# Patient Record
Sex: Female | Born: 1945
Health system: Southern US, Community
[De-identification: ages and names within clinical notes are randomized; demographics above are authoritative.]

## PROBLEM LIST (undated history)

## (undated) DIAGNOSIS — E7800 Pure hypercholesterolemia, unspecified: Secondary | ICD-10-CM

## (undated) DIAGNOSIS — E782 Mixed hyperlipidemia: Secondary | ICD-10-CM

## (undated) DIAGNOSIS — G4730 Sleep apnea, unspecified: Secondary | ICD-10-CM

## (undated) DIAGNOSIS — I10 Essential (primary) hypertension: Secondary | ICD-10-CM

## (undated) DIAGNOSIS — E119 Type 2 diabetes mellitus without complications: Secondary | ICD-10-CM

## (undated) DIAGNOSIS — I739 Peripheral vascular disease, unspecified: Secondary | ICD-10-CM

## (undated) HISTORY — DX: Mixed hyperlipidemia: E78.2

## (undated) HISTORY — PX: CYST REMOVAL TRUNK: SHX6283

## (undated) HISTORY — DX: Essential (primary) hypertension: I10

## (undated) HISTORY — DX: Type 2 diabetes mellitus without complications: E11.9

## (undated) HISTORY — PX: OTHER SURGICAL HISTORY: SHX169

## (undated) HISTORY — PX: CERVICAL SPINE SURGERY: SHX589

## (undated) HISTORY — PX: BACK SURGERY: SHX140

## (undated) HISTORY — PX: ABDOMINAL HYSTERECTOMY: SHX81

---
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
## 2002-08-23 DIAGNOSIS — I219 Acute myocardial infarction, unspecified: Secondary | ICD-10-CM

## 2002-08-23 HISTORY — DX: Acute myocardial infarction, unspecified: I21.9

## 2003-04-10 ENCOUNTER — Emergency Department (HOSPITAL_COMMUNITY): Admission: EM | Admit: 2003-04-10 | Discharge: 2003-04-10 | Payer: Self-pay | Admitting: Emergency Medicine

## 2009-08-19 ENCOUNTER — Emergency Department (HOSPITAL_COMMUNITY): Admission: EM | Admit: 2009-08-19 | Discharge: 2009-08-19 | Payer: Self-pay | Admitting: Emergency Medicine

## 2009-08-21 ENCOUNTER — Emergency Department (HOSPITAL_COMMUNITY): Admission: EM | Admit: 2009-08-21 | Discharge: 2009-08-21 | Payer: Self-pay | Admitting: Emergency Medicine

## 2009-09-26 ENCOUNTER — Ambulatory Visit (HOSPITAL_COMMUNITY): Admission: RE | Admit: 2009-09-26 | Discharge: 2009-09-26 | Payer: Self-pay | Admitting: Internal Medicine

## 2009-10-08 ENCOUNTER — Ambulatory Visit (HOSPITAL_COMMUNITY): Admission: RE | Admit: 2009-10-08 | Discharge: 2009-10-08 | Payer: Self-pay | Admitting: Internal Medicine

## 2009-10-14 ENCOUNTER — Encounter: Payer: Self-pay | Admitting: Urgent Care

## 2010-09-22 NOTE — Letter (Signed)
Summary: Referral from [facility]  Referral from Dr T. Fanta's office for screening colonoscopy.  Upon further questioning, pt notes colonoscopy in [place] 2007 that was normal.  No polyps, no FH, no GI problems per pt.  Called Dr Letitia Neri office who verified referral was to arrange screening colonoscopy.  Pt asked to retrieve copy of colonoscopy results from [place] and give to Dr Felecia Shelling. Explained we would be glad to see pt if, in fact, she was due for screening colonoscopy at this time.  Explained current guidelines are q 10 yrs if no FH or polyps.  She verbalized understanding & will FU w/ Dr Felecia Shelling.  Given our office manager, [facility],  card to contact us if she has further questions or to call & reschedule if she has further problems & when she is due for exam.

## 2010-11-12 LAB — BLOOD GAS, ARTERIAL
Bicarbonate: 27.3 mEq/L — ABNORMAL HIGH (ref 20.0–24.0)
FIO2: 21 %
O2 Saturation: 94.2 %
Patient temperature: 37
TCO2: 24.5 mmol/L (ref 0–100)
pO2, Arterial: 69.7 mmHg — ABNORMAL LOW (ref 80.0–100.0)

## 2010-11-23 LAB — CBC
HCT: 43.2 % (ref 36.0–46.0)
Hemoglobin: 14.3 g/dL (ref 12.0–15.0)
MCV: 82.6 fL (ref 78.0–100.0)
RBC: 5.24 MIL/uL — ABNORMAL HIGH (ref 3.87–5.11)
WBC: 16.4 10*3/uL — ABNORMAL HIGH (ref 4.0–10.5)

## 2010-11-23 LAB — URINE MICROSCOPIC-ADD ON

## 2010-11-23 LAB — DIFFERENTIAL
Basophils Relative: 0 % (ref 0–1)
Eosinophils Absolute: 0 10*3/uL (ref 0.0–0.7)
Eosinophils Relative: 0 % (ref 0–5)
Monocytes Absolute: 1.4 10*3/uL — ABNORMAL HIGH (ref 0.1–1.0)
Neutrophils Relative %: 83 % — ABNORMAL HIGH (ref 43–77)

## 2010-11-23 LAB — URINE CULTURE: Colony Count: NO GROWTH

## 2010-11-23 LAB — URINALYSIS, ROUTINE W REFLEX MICROSCOPIC
Glucose, UA: NEGATIVE mg/dL
Leukocytes, UA: NEGATIVE
Protein, ur: >300 mg/dL — AB
pH: 5.5 (ref 5.0–8.0)

## 2010-11-23 LAB — RAPID STREP SCREEN (MED CTR MEBANE ONLY): Streptococcus, Group A Screen (Direct): POSITIVE — AB

## 2010-11-23 LAB — BASIC METABOLIC PANEL
BUN: 22 mg/dL (ref 6–23)
Calcium: 8.3 mg/dL — ABNORMAL LOW (ref 8.4–10.5)
GFR calc Af Amer: 37 mL/min — ABNORMAL LOW (ref >60)
GFR calc non Af Amer: 30 mL/min — ABNORMAL LOW (ref >60)
Sodium: 135 mEq/L (ref 135–145)

## 2012-03-06 DIAGNOSIS — N1831 Chronic kidney disease, stage 3a: Secondary | ICD-10-CM | POA: Insufficient documentation

## 2012-03-06 DIAGNOSIS — N182 Chronic kidney disease, stage 2 (mild): Secondary | ICD-10-CM | POA: Insufficient documentation

## 2013-07-14 ENCOUNTER — Encounter (HOSPITAL_COMMUNITY): Payer: Self-pay | Admitting: Emergency Medicine

## 2013-07-14 ENCOUNTER — Emergency Department (HOSPITAL_COMMUNITY)
Admission: EM | Admit: 2013-07-14 | Discharge: 2013-07-14 | Disposition: A | Discharge status: Home / Self Care | Payer: PRIVATE HEALTH INSURANCE | Attending: Emergency Medicine | Admitting: Emergency Medicine

## 2013-07-14 DIAGNOSIS — L723 Sebaceous cyst: Secondary | ICD-10-CM | POA: Insufficient documentation

## 2013-07-14 DIAGNOSIS — M62830 Muscle spasm of back: Secondary | ICD-10-CM

## 2013-07-14 DIAGNOSIS — Z7982 Long term (current) use of aspirin: Secondary | ICD-10-CM | POA: Insufficient documentation

## 2013-07-14 DIAGNOSIS — L089 Local infection of the skin and subcutaneous tissue, unspecified: Secondary | ICD-10-CM

## 2013-07-14 DIAGNOSIS — E7800 Pure hypercholesterolemia, unspecified: Secondary | ICD-10-CM | POA: Insufficient documentation

## 2013-07-14 DIAGNOSIS — L02219 Cutaneous abscess of trunk, unspecified: Secondary | ICD-10-CM | POA: Insufficient documentation

## 2013-07-14 DIAGNOSIS — M5380 Other specified dorsopathies, site unspecified: Secondary | ICD-10-CM | POA: Insufficient documentation

## 2013-07-14 DIAGNOSIS — Z9889 Other specified postprocedural states: Secondary | ICD-10-CM | POA: Insufficient documentation

## 2013-07-14 DIAGNOSIS — Z79899 Other long term (current) drug therapy: Secondary | ICD-10-CM | POA: Insufficient documentation

## 2013-07-14 DIAGNOSIS — I10 Essential (primary) hypertension: Secondary | ICD-10-CM | POA: Insufficient documentation

## 2013-07-14 DIAGNOSIS — E119 Type 2 diabetes mellitus without complications: Secondary | ICD-10-CM | POA: Insufficient documentation

## 2013-07-14 DIAGNOSIS — Z87891 Personal history of nicotine dependence: Secondary | ICD-10-CM | POA: Insufficient documentation

## 2013-07-14 HISTORY — DX: Essential (primary) hypertension: I10

## 2013-07-14 HISTORY — DX: Type 2 diabetes mellitus without complications: E11.9

## 2013-07-14 HISTORY — DX: Pure hypercholesterolemia, unspecified: E78.00

## 2013-07-14 LAB — GLUCOSE, CAPILLARY: Glucose-Capillary: 94 mg/dL (ref 70–99)

## 2013-07-14 MED ORDER — CYCLOBENZAPRINE HCL 10 MG PO TABS
10.0000 mg | ORAL_TABLET | Freq: Three times a day (TID) | ORAL | Status: DC | PRN
  Administered 2013-07-14: 10 mg via ORAL
  Filled 2013-07-14: qty 1

## 2013-07-14 MED ORDER — KETOROLAC TROMETHAMINE 60 MG/2ML IM SOLN
60.0000 mg | Freq: Once | INTRAMUSCULAR | Status: AC
  Administered 2013-07-14: 60 mg via INTRAMUSCULAR
  Filled 2013-07-14: qty 2

## 2013-07-14 MED ORDER — LIDOCAINE-EPINEPHRINE (PF) 1 %-1:200000 IJ SOLN
INTRAMUSCULAR | Status: AC
  Filled 2013-07-14: qty 10

## 2013-07-14 MED ORDER — LIDOCAINE-EPINEPHRINE (PF) 1 %-1:200000 IJ SOLN
10.0000 mL | Freq: Once | INTRAMUSCULAR | Status: AC
  Administered 2013-07-14: 18:00:00 via INTRADERMAL

## 2013-07-14 MED ORDER — CYCLOBENZAPRINE HCL 5 MG PO TABS: 5.0000 mg | ORAL_TABLET | Freq: Three times a day (TID) | ORAL | Status: DC | PRN

## 2013-07-14 MED ORDER — SULFAMETHOXAZOLE-TRIMETHOPRIM 800-160 MG PO TABS: 1.0000 | ORAL_TABLET | Freq: Two times a day (BID) | ORAL | Status: DC

## 2013-07-14 MED ORDER — LIDOCAINE-EPINEPHRINE 1 %-1:100000 IJ SOLN: 10.0000 mL | Freq: Once | INTRAMUSCULAR | Status: DC

## 2013-07-14 MED ORDER — NAPROXEN 375 MG PO TABS: 375.0000 mg | ORAL_TABLET | Freq: Two times a day (BID) | ORAL | Status: DC

## 2013-07-14 NOTE — ED Notes (Signed)
Pt states cyst to right upper back has gotten bigger, also starting to have pain to left lower back pain

## 2013-07-14 NOTE — ED Provider Notes (Signed)
CSN: [id number]     Arrival date & time 07/14/13  1619 History  This chart was scribed for Nicole Givens, MD by Quintella Reichert, ED scribe.  This patient was seen in room APA17/APA17 and the patient's care was started at 5:03 PM.   Chief Complaint  Patient presents with  . Back Pain  . Abscess    The history is provided by the patient. No language interpreter was used.    HPI Comments: Nicole Horn is a 67 y.o. female with h/o DM, HTN and high cholesterol who presents to the Emergency Department complaining of moderate left-sided back pain that began early this morning.  Back pain is worsened by leaning to the side and bending forwards.  She notes that earlier this week she was blowing leaves in her yard for less than half an hour.  She denies any other recent injuries or unusual activities that may have brought on pain.  She is left-handed.  She denies weakness or numbness in legs.  Pt denies prior h/o back issues.    Pt also complains of an cyst to the right upper back that has been present "for a while" but has been growing larger over the past several days.  She denies fever, nausea or vomiting.  She does admit to prior h/o abscesses/cysts in various locations and has had all of her surgeries to remove them in [place].  Her last surgery was 3-4 years ago.  PCP in [place] is Dr. Felecia Shelling.    Past Medical History  Diagnosis Date  . Diabetes mellitus without complication   . Hypertension   . High cholesterol     Past Surgical History  Procedure Laterality Date  . Cyst removal trunk    . Multiple cyst removal surgeries    . Abdominal hysterectomy      History reviewed. No pertinent family history.   History  Substance Use Topics  . Smoking status: Former Games developer  . Smokeless tobacco: Not on file  . Alcohol Use: Yes     Comment: very rare    Pt does not smoke.  She is a retired former Mining engineer.  She lived in [place] for 50 years and now lives here part-time  to be with her mother.  OB History   Grav Para Term Preterm Abortions TAB SAB Ect Mult Living                  Review of Systems  Constitutional: Negative for fever.  Gastrointestinal: Negative for nausea and vomiting.  Musculoskeletal: Positive for back pain.  Skin:       Abscess  Neurological: Negative for weakness and numbness.  All other systems reviewed and are negative.     Allergies  Review of patient's allergies indicates no known allergies.  Home Medications   Current Outpatient Rx  Name  Route  Sig  Dispense  Refill  . aspirin EC 81 MG tablet   Oral   Take 81 mg by mouth daily.         . hydrALAZINE (APRESOLINE) 25 MG tablet   Oral   Take 25 mg by mouth every 8 (eight) hours.         . lovastatin (MEVACOR) 40 MG tablet   Oral   Take 40 mg by mouth at bedtime.         . quinapril (ACCUPRIL) 10 MG tablet   Oral   Take 10 mg by mouth daily.  BP 144/80  Pulse 80  Temp(Src) 98.7 F (37.1 C) (Oral)  Resp 20  Ht 5' (1.524 m)  Wt 163 lb (73.936 kg)  BMI 31.83 kg/m2  SpO2 100%  Vital signs normal    Physical Exam  Nursing note and vitals reviewed. Constitutional: She is oriented to person, place, and time. She appears well-developed and well-nourished.  Non-toxic appearance. She does not appear ill. No distress.  HENT:  Head: Normocephalic and atraumatic.  Right Ear: External ear normal.  Left Ear: External ear normal.  Nose: Nose normal. No mucosal edema or rhinorrhea.  Mouth/Throat: Oropharynx is clear and moist and mucous membranes are normal. No dental abscesses or uvula swelling.  Eyes: Conjunctivae and EOM are normal. Pupils are equal, round, and reactive to light.  Neck: Normal range of motion and full passive range of motion without pain. Neck supple.  Cardiovascular: Normal rate, regular rhythm and normal heart sounds.  Exam reveals no gallop and no friction rub.   No murmur heard. Pulmonary/Chest: Effort normal and  breath sounds normal. No respiratory distress. She has no wheezes. She has no rhonchi. She has no rales. She exhibits no tenderness and no crepitus.  Abdominal: Soft. Normal appearance and bowel sounds are normal. She exhibits no distension. There is no tenderness. There is no rebound and no guarding.  Musculoskeletal: Normal range of motion. She exhibits tenderness. She exhibits no edema.       Back:  Tender in mid-lower thoracic spine into the upper lumbar spine and paraspinous muscles on the left.  Muscles feel firm. Moves all extremities well.   Neurological: She is alert and oriented to person, place, and time. She has normal strength. No cranial nerve deficit.  Skin: Skin is warm, dry and intact. No pallor.  1-cm cyst on right mid upper back, just medial to scapula.  Slightly firm.  Nontender. 2.5-cm area of redness near the lateral right back just superior to the axilla, swollen and very indurated.    Psychiatric: She has a normal mood and affect. Her speech is normal and behavior is normal. Her mood appears not anxious.    ED Course  Procedures (including critical care time)  Medications  cyclobenzaprine (FLEXERIL) tablet 10 mg (10 mg Oral Given 07/14/13 1728)  ketorolac (TORADOL) injection 60 mg (60 mg Intramuscular Given 07/14/13 1729)  lidocaine-EPINEPHrine (XYLOCAINE-EPINEPHrine) 1 %-1:200000 (with pres) injection 10 mL ( Intradermal Given by Other 07/14/13 1752)     DIAGNOSTIC STUDIES: Oxygen Saturation is 100% on room air, normal by my interpretation.    COORDINATION OF CARE: 5:13 PM-Discussed treatment plan which includes I&D with pt at bedside and pt agreed to plan.   Pt states her back pain is better after medications.     INCISION AND DRAINAGE Performed by: Nicole Givens, MD Consent: Verbal consent obtained. Risks and benefits: risks, benefits and alternatives were discussed Type: abscess Body area: lateral right back just superior to the axilla Anesthesia:  local infiltration Incision was made with an 11 scalpel. Local anesthetic: lidocaine 1% with 1% epinephrine Anesthetic total: 1 ml Complexity: complex Blunt dissection to break up loculations Drainage: purulent, pieces of the sebaceous sac also came out Drainage amount: small Packing material: 1/4 in iodoform gauze Patient tolerance: Patient tolerated the procedure well with no immediate complications.   Labs Review Results for orders placed during the hospital encounter of 07/14/13  GLUCOSE, CAPILLARY      Result Value Range   Glucose-Capillary 94  70 - 99 mg/dL  Imaging Review No results found.  EKG Interpretation   None       MDM  patient has history of sebaceous cyst and some abscesses. She has a cyst on her right posterior shoulder that has gotten progressively painful and now is an infected sebaceous cyst. Her left back pain is most likely from holding her self differently because of pain on her right side from her abscess. She reports she has been able to sleep on her side and has been sleeping on her abdomen.    1. Muscle spasm of back   2. Infected sebaceous cyst    New Prescriptions   CYCLOBENZAPRINE (FLEXERIL) 5 MG TABLET    Take 1 tablet (5 mg total) by mouth 3 (three) times daily as needed for muscle spasms.   NAPROXEN (NAPROSYN) 375 MG TABLET    Take 1 tablet (375 mg total) by mouth 2 (two) times daily.   SULFAMETHOXAZOLE-TRIMETHOPRIM (SEPTRA DS) 800-160 MG PER TABLET    Take 1 tablet by mouth every 12 (twelve) hours.    Plan discharge   Devoria Albe, MD, FACEP     I personally performed the services described in this documentation, which was scribed in my presence. The recorded information has been reviewed and considered.  Devoria Albe, MD, Armando Gang    Nicole Givens, MD 07/14/13 [phone]

## 2014-07-30 ENCOUNTER — Emergency Department (HOSPITAL_COMMUNITY): Payer: Medicare (Managed Care)

## 2014-07-30 ENCOUNTER — Encounter (HOSPITAL_COMMUNITY): Payer: Self-pay | Admitting: *Deleted

## 2014-07-30 ENCOUNTER — Emergency Department (HOSPITAL_COMMUNITY)
Admission: EM | Admit: 2014-07-30 | Discharge: 2014-07-30 | Disposition: A | Discharge status: Home / Self Care | Payer: Medicare (Managed Care) | Attending: Emergency Medicine | Admitting: Emergency Medicine

## 2014-07-30 DIAGNOSIS — E119 Type 2 diabetes mellitus without complications: Secondary | ICD-10-CM | POA: Insufficient documentation

## 2014-07-30 DIAGNOSIS — E780 Pure hypercholesterolemia: Secondary | ICD-10-CM | POA: Diagnosis not present

## 2014-07-30 DIAGNOSIS — R1084 Generalized abdominal pain: Secondary | ICD-10-CM | POA: Insufficient documentation

## 2014-07-30 DIAGNOSIS — I10 Essential (primary) hypertension: Secondary | ICD-10-CM | POA: Insufficient documentation

## 2014-07-30 DIAGNOSIS — Z79899 Other long term (current) drug therapy: Secondary | ICD-10-CM | POA: Diagnosis not present

## 2014-07-30 DIAGNOSIS — E860 Dehydration: Secondary | ICD-10-CM

## 2014-07-30 DIAGNOSIS — Z87891 Personal history of nicotine dependence: Secondary | ICD-10-CM | POA: Insufficient documentation

## 2014-07-30 DIAGNOSIS — Z7982 Long term (current) use of aspirin: Secondary | ICD-10-CM | POA: Insufficient documentation

## 2014-07-30 DIAGNOSIS — Z792 Long term (current) use of antibiotics: Secondary | ICD-10-CM | POA: Insufficient documentation

## 2014-07-30 DIAGNOSIS — Z791 Long term (current) use of non-steroidal anti-inflammatories (NSAID): Secondary | ICD-10-CM | POA: Insufficient documentation

## 2014-07-30 DIAGNOSIS — Z90710 Acquired absence of both cervix and uterus: Secondary | ICD-10-CM | POA: Insufficient documentation

## 2014-07-30 DIAGNOSIS — R112 Nausea with vomiting, unspecified: Secondary | ICD-10-CM | POA: Insufficient documentation

## 2014-07-30 LAB — CBC WITH DIFFERENTIAL/PLATELET
BASOS ABS: 0 10*3/uL (ref 0.0–0.1)
Basophils Relative: 0 % (ref 0–1)
EOS PCT: 0 % (ref 0–5)
Eosinophils Absolute: 0 10*3/uL (ref 0.0–0.7)
HCT: 44.1 % (ref 36.0–46.0)
Hemoglobin: 14.3 g/dL (ref 12.0–15.0)
LYMPHS ABS: 1.4 10*3/uL (ref 0.7–4.0)
LYMPHS PCT: 18 % (ref 12–46)
MCH: 26.4 pg (ref 26.0–34.0)
MCHC: 32.4 g/dL (ref 30.0–36.0)
MCV: 81.5 fL (ref 78.0–100.0)
Monocytes Absolute: 0.3 10*3/uL (ref 0.1–1.0)
Monocytes Relative: 5 % (ref 3–12)
NEUTROS ABS: 5.9 10*3/uL (ref 1.7–7.7)
NEUTROS PCT: 77 % (ref 43–77)
Platelets: 263 10*3/uL (ref 150–400)
RBC: 5.41 MIL/uL — AB (ref 3.87–5.11)
RDW: 13.5 % (ref 11.5–15.5)
WBC: 7.6 10*3/uL (ref 4.0–10.5)

## 2014-07-30 LAB — COMPREHENSIVE METABOLIC PANEL
ALT: 26 U/L (ref 0–35)
ANION GAP: 16 — AB (ref 5–15)
AST: 23 U/L (ref 0–37)
Albumin: 4 g/dL (ref 3.5–5.2)
Alkaline Phosphatase: 114 U/L (ref 39–117)
BILIRUBIN TOTAL: 0.3 mg/dL (ref 0.3–1.2)
BUN: 13 mg/dL (ref 6–23)
CALCIUM: 9.6 mg/dL (ref 8.4–10.5)
CHLORIDE: 103 meq/L (ref 96–112)
CO2: 25 meq/L (ref 19–32)
CREATININE: 0.94 mg/dL (ref 0.50–1.10)
GFR calc non Af Amer: 61 mL/min — ABNORMAL LOW (ref >90)
GFR, EST AFRICAN AMERICAN: 71 mL/min — AB (ref >90)
GLUCOSE: 176 mg/dL — AB (ref 70–99)
Potassium: 3.8 mEq/L (ref 3.7–5.3)
Sodium: 144 mEq/L (ref 137–147)
Total Protein: 8.2 g/dL (ref 6.0–8.3)

## 2014-07-30 LAB — POC OCCULT BLOOD, ED: Fecal Occult Bld: NEGATIVE

## 2014-07-30 LAB — URINALYSIS, ROUTINE W REFLEX MICROSCOPIC
Bilirubin Urine: NEGATIVE
Glucose, UA: NEGATIVE mg/dL
Ketones, ur: NEGATIVE mg/dL
LEUKOCYTES UA: NEGATIVE
NITRITE: NEGATIVE
PH: 5.5 (ref 5.0–8.0)
Protein, ur: 100 mg/dL — AB
Urobilinogen, UA: 0.2 mg/dL (ref 0.0–1.0)

## 2014-07-30 LAB — URINE MICROSCOPIC-ADD ON

## 2014-07-30 LAB — LIPASE, BLOOD: LIPASE: 62 U/L — AB (ref 11–59)

## 2014-07-30 MED ORDER — METOCLOPRAMIDE HCL 5 MG/ML IJ SOLN
10.0000 mg | Freq: Once | INTRAMUSCULAR | Status: AC
  Administered 2014-07-30: 10 mg via INTRAVENOUS
  Filled 2014-07-30: qty 2

## 2014-07-30 MED ORDER — DIPHENHYDRAMINE HCL 50 MG/ML IJ SOLN
25.0000 mg | Freq: Once | INTRAMUSCULAR | Status: AC
  Administered 2014-07-30: 25 mg via INTRAVENOUS
  Filled 2014-07-30: qty 1

## 2014-07-30 MED ORDER — IOHEXOL 300 MG/ML  SOLN
100.0000 mL | Freq: Once | INTRAMUSCULAR | Status: AC | PRN
  Administered 2014-07-30: 100 mL via INTRAVENOUS

## 2014-07-30 MED ORDER — ONDANSETRON HCL 4 MG/2ML IJ SOLN
4.0000 mg | Freq: Once | INTRAMUSCULAR | Status: AC
  Administered 2014-07-30: 4 mg via INTRAVENOUS
  Filled 2014-07-30: qty 2

## 2014-07-30 MED ORDER — SODIUM CHLORIDE 0.9 % IV BOLUS (SEPSIS): 1000.0000 mL | Freq: Once | INTRAVENOUS | Status: DC

## 2014-07-30 MED ORDER — METOCLOPRAMIDE HCL 10 MG PO TABS: 10.0000 mg | ORAL_TABLET | Freq: Four times a day (QID) | ORAL | Status: DC | PRN

## 2014-07-30 MED ORDER — ONDANSETRON HCL 4 MG PO TABS: 4.0000 mg | ORAL_TABLET | Freq: Three times a day (TID) | ORAL | Status: DC | PRN

## 2014-07-30 MED ORDER — MORPHINE SULFATE 4 MG/ML IJ SOLN
4.0000 mg | Freq: Once | INTRAMUSCULAR | Status: AC
  Administered 2014-07-30: 4 mg via INTRAVENOUS
  Filled 2014-07-30: qty 1

## 2014-07-30 MED ORDER — SODIUM CHLORIDE 0.9 % IV BOLUS (SEPSIS)
1000.0000 mL | Freq: Once | INTRAVENOUS | Status: AC
  Administered 2014-07-30: 1000 mL via INTRAVENOUS

## 2014-07-30 MED ORDER — IOHEXOL 300 MG/ML  SOLN
25.0000 mL | Freq: Once | INTRAMUSCULAR | Status: AC | PRN
  Administered 2014-07-30: 25 mL via ORAL

## 2014-07-30 MED ORDER — MORPHINE SULFATE 4 MG/ML IJ SOLN
4.0000 mg | Freq: Once | INTRAMUSCULAR | Status: AC
Start: 2014-07-30 — End: 2014-07-30
  Administered 2014-07-30: 4 mg via INTRAVENOUS
  Filled 2014-07-30: qty 1

## 2014-07-30 NOTE — ED Notes (Signed)
Pt drowsy but says feels better.  Pt's room air 02 sat decreased to 89% while sleeping.  Placed pt on 02 at 2liters and 02 sat increased to 98%.  Pt sitting up in bed drinking water.  VSS.

## 2014-07-30 NOTE — ED Provider Notes (Signed)
CSN: [id number]     Arrival date & time 07/30/14  0152 History   First MD Initiated Contact with Patient 07/30/14 0205     Chief Complaint  Patient presents with  . Abdominal Pain     (Consider location/radiation/quality/duration/timing/severity/associated sxs/prior Treatment) HPI  Patient reports she felt fatigued all day today. About 6 PM she started having diffuse abdominal pain that comes and goes like gas pain. She states she took Pepto-Bismol twice which only helped briefly. She also drinks some warm water which did not help. She reports nausea with vomiting 3. She states the pain comes and goes and is described as cramping and sharp. She states she's had a feeling she needs to have a bowel movement and she was unable to have one although at one point she did have a small amount of stool that was both dark and light. She states when she wiped she saw blood. She also states she saw streaks of blood in her vomitus. She also states she saw some blood in her nose. She has never had this pain before. She does report prior hysterectomy about 20 years ago.  PCP Dr Legrand Rams PCP in [place]  Past Medical History  Diagnosis Date  . Diabetes mellitus without complication   . Hypertension   . High cholesterol    Past Surgical History  Procedure Laterality Date  . Cyst removal trunk    . Multiple cyst removal surgeries    . Abdominal hysterectomy     History reviewed. No pertinent family history. History  Substance Use Topics  . Smoking status: Former Research scientist (life sciences)  . Smokeless tobacco: Not on file  . Alcohol Use: Yes     Comment: very rare   Lives alone Is living between here and [place]  OB History    No data available     Review of Systems  All other systems reviewed and are negative.     Allergies  Review of patient's allergies indicates no known allergies.  Home Medications   Prior to Admission medications   Medication Sig Start Date End Date Taking? Authorizing Provider   aspirin EC 81 MG tablet Take 81 mg by mouth daily.    Historical Provider, MD  cyclobenzaprine (FLEXERIL) 5 MG tablet Take 1 tablet (5 mg total) by mouth 3 (three) times daily as needed for muscle spasms. 07/14/13   Janice Norrie, MD  hydrALAZINE (APRESOLINE) 25 MG tablet Take 25 mg by mouth every 8 (eight) hours.    Historical Provider, MD  lovastatin (MEVACOR) 40 MG tablet Take 40 mg by mouth at bedtime.    Historical Provider, MD  naproxen (NAPROSYN) 375 MG tablet Take 1 tablet (375 mg total) by mouth 2 (two) times daily. 07/14/13   Janice Norrie, MD  quinapril (ACCUPRIL) 10 MG tablet Take 10 mg by mouth daily.    Historical Provider, MD  sulfamethoxazole-trimethoprim (SEPTRA DS) 800-160 MG per tablet Take 1 tablet by mouth every 12 (twelve) hours. 07/14/13   Janice Norrie, MD   BP 200/153 mmHg  Pulse 83  Temp(Src) 97.6 F (36.4 C) (Oral)  Resp 20  Ht 5' (1.524 m)  Wt 165 lb (74.844 kg)  BMI 32.22 kg/m2  SpO2 97%  Vital signs normal except for hypertension Physical Exam  Constitutional: She is oriented to person, place, and time. She appears well-developed and well-nourished.  Non-toxic appearance. She does not appear ill. She appears distressed.  Patient periodically during her interview will grab her abdomen as  if she is having spasms of pain  HENT:  Head: Normocephalic and atraumatic.  Right Ear: External ear normal.  Left Ear: External ear normal.  Nose: Nose normal. No mucosal edema or rhinorrhea.  Mouth/Throat: Oropharynx is clear and moist and mucous membranes are normal. No dental abscesses or uvula swelling.  Eyes: Conjunctivae and EOM are normal. Pupils are equal, round, and reactive to light.  Neck: Normal range of motion and full passive range of motion without pain. Neck supple.  Cardiovascular: Normal rate, regular rhythm and normal heart sounds.  Exam reveals no gallop and no friction rub.   No murmur heard. Pulmonary/Chest: Effort normal and breath sounds normal. No  respiratory distress. She has no wheezes. She has no rhonchi. She has no rales. She exhibits no tenderness and no crepitus.  Abdominal: Soft. Normal appearance and bowel sounds are normal. She exhibits distension. There is generalized tenderness. There is no rebound and no guarding.  Musculoskeletal: Normal range of motion. She exhibits no edema or tenderness.  Moves all extremities well.   Neurological: She is alert and oriented to person, place, and time. She has normal strength. No cranial nerve deficit.  Skin: Skin is warm, dry and intact. No rash noted. No erythema. No pallor.  Psychiatric: She has a normal mood and affect. Her speech is normal and behavior is normal. Her mood appears not anxious.  Nursing note and vitals reviewed.   ED Course  Procedures (including critical care time)  Medications  sodium chloride 0.9 % bolus 1,000 mL (not administered)  sodium chloride 0.9 % bolus 1,000 mL (not administered)  sodium chloride 0.9 % bolus 1,000 mL (1,000 mLs Intravenous New Bag/Given 07/30/14 0248)  ondansetron (ZOFRAN) injection 4 mg (4 mg Intravenous Given 07/30/14 0251)  morphine 4 MG/ML injection 4 mg (4 mg Intravenous Given 07/30/14 0248)  iohexol (OMNIPAQUE) 300 MG/ML solution 25 mL (25 mLs Oral Contrast Given 07/30/14 0514)  iohexol (OMNIPAQUE) 300 MG/ML solution 100 mL (100 mLs Intravenous Contrast Given 07/30/14 0515)  morphine 4 MG/ML injection 4 mg (4 mg Intravenous Given 07/30/14 0548)  metoCLOPramide (REGLAN) injection 10 mg (10 mg Intravenous Given 07/30/14 0643)  diphenhydrAMINE (BENADRYL) injection 25 mg (25 mg Intravenous Given 07/30/14 0645)   04 10 patient given results of her tests and x-rays. She states her pain is much improved. Patient's lying quietly on a stretcher now. We discussed need for CT scan and she is agreeable.  06:00 patient was given the results of her CT scan. She was given more pain and nausea medicine. Hopefully she will be able to be discharged  home.  Recheck at 06 50 patient is sleeping and when awakened states she's feeling improved.  07:15 pt left at change of shift to finish IV fluids and to make sure she can tolerate oral fluids.  Dr Vanita Panda will discharge or admit if she gets worse.   Labs Review Results for orders placed or performed during the hospital encounter of 07/30/14  Comprehensive metabolic panel  Result Value Ref Range   Sodium 144 137 - 147 mEq/L   Potassium 3.8 3.7 - 5.3 mEq/L   Chloride 103 96 - 112 mEq/L   CO2 25 19 - 32 mEq/L   Glucose, Bld 176 (H) 70 - 99 mg/dL   BUN 13 6 - 23 mg/dL   Creatinine, Ser 0.94 0.50 - 1.10 mg/dL   Calcium 9.6 8.4 - 10.5 mg/dL   Total Protein 8.2 6.0 - 8.3 g/dL   Albumin 4.0 3.5 -  5.2 g/dL   AST 23 0 - 37 U/L   ALT 26 0 - 35 U/L   Alkaline Phosphatase 114 39 - 117 U/L   Total Bilirubin 0.3 0.3 - 1.2 mg/dL   GFR calc non Af Amer 61 (L) >90 mL/min   GFR calc Af Amer 71 (L) >90 mL/min   Anion gap 16 (H) 5 - 15  CBC with Differential  Result Value Ref Range   WBC 7.6 4.0 - 10.5 K/uL   RBC 5.41 (H) 3.87 - 5.11 MIL/uL   Hemoglobin 14.3 12.0 - 15.0 g/dL   HCT 44.1 36.0 - 46.0 %   MCV 81.5 78.0 - 100.0 fL   MCH 26.4 26.0 - 34.0 pg   MCHC 32.4 30.0 - 36.0 g/dL   RDW 13.5 11.5 - 15.5 %   Platelets 263 150 - 400 K/uL   Neutrophils Relative % 77 43 - 77 %   Neutro Abs 5.9 1.7 - 7.7 K/uL   Lymphocytes Relative 18 12 - 46 %   Lymphs Abs 1.4 0.7 - 4.0 K/uL   Monocytes Relative 5 3 - 12 %   Monocytes Absolute 0.3 0.1 - 1.0 K/uL   Eosinophils Relative 0 0 - 5 %   Eosinophils Absolute 0.0 0.0 - 0.7 K/uL   Basophils Relative 0 0 - 1 %   Basophils Absolute 0.0 0.0 - 0.1 K/uL  Lipase, blood  Result Value Ref Range   Lipase 62 (H) 11 - 59 U/L  Urinalysis, Routine w reflex microscopic  Result Value Ref Range   Color, Urine YELLOW YELLOW   APPearance CLEAR CLEAR   Specific Gravity, Urine >1.030 (H) 1.005 - 1.030   pH 5.5 5.0 - 8.0   Glucose, UA NEGATIVE NEGATIVE mg/dL    Hgb urine dipstick SMALL (A) NEGATIVE   Bilirubin Urine NEGATIVE NEGATIVE   Ketones, ur NEGATIVE NEGATIVE mg/dL   Protein, ur 100 (A) NEGATIVE mg/dL   Urobilinogen, UA 0.2 0.0 - 1.0 mg/dL   Nitrite NEGATIVE NEGATIVE   Leukocytes, UA NEGATIVE NEGATIVE  Urine microscopic-add on  Result Value Ref Range   Squamous Epithelial / LPF MANY (A) RARE   WBC, UA 0-2 <3 WBC/hpf   RBC / HPF 0-2 <3 RBC/hpf   Bacteria, UA FEW (A) RARE  POC occult blood, ED RN will collect  Result Value Ref Range   Fecal Occult Bld NEGATIVE NEGATIVE   Laboratory interpretation all normal except concentrated urine consistent with dehydration     Imaging Review Ct Abdomen Pelvis W Contrast  07/30/2014   CLINICAL DATA:  Lower abdominal pain, nausea, vomiting, diarrhea.  EXAM: CT ABDOMEN AND PELVIS WITH CONTRAST  TECHNIQUE: Multidetector CT imaging of the abdomen and pelvis was performed using the standard protocol following bolus administration of intravenous contrast.  CONTRAST:  65mL OMNIPAQUE IOHEXOL 300 MG/ML SOLN, 124mL OMNIPAQUE IOHEXOL 300 MG/ML SOLN  COMPARISON:  None.  FINDINGS: Mild dependent changes in the lung bases.  Diffuse fatty infiltration of the liver. The gallbladder, pancreas, spleen, adrenal glands, kidneys, inferior vena cava, and retroperitoneal lymph nodes are unremarkable. Calcification of the abdominal aorta without aneurysm. Stomach appears normal. Prominent but nondistended fluid-filled small bowel without transition zone. The mild small bowel wall thickening and mesenteric changes. Changes likely due to enteritis. Colon is decompressed. No free air in the abdomen.  Pelvis: Uterus is surgically absent. Bladder wall is not thickened. Appendix is not identified. Diverticulosis of sigmoid colon without evidence of diverticulitis. No pelvic mass or lymphadenopathy. No  free or loculated pelvic fluid collections. Degenerative changes in the spine. No destructive bone lesions.  IMPRESSION: Fluid-filled small  bowel with thickened wall and mesenteric infiltration suggesting enteritis. No evidence of obstruction. Diffuse fatty infiltration of the liver.   Electronically Signed   By: Lucienne Capers M.D.   On: 07/30/2014 05:39   Dg Abd Acute W/chest  07/30/2014   CLINICAL DATA:  Abdominal pain, nausea, vomiting, and diarrhea.  EXAM: ACUTE ABDOMEN SERIES (ABDOMEN 2 VIEW & CHEST 1 VIEW)  COMPARISON:  Chest 08/19/2009.  FINDINGS: Emphysematous changes in the lungs. Normal heart size and pulmonary vascularity. No focal airspace disease or consolidation in the lungs. No blunting of costophrenic angles. No pneumothorax. Mediastinal contours appear intact.  Gas-filled mildly dilated small bowel with paucity of gas in the colon. No acute air-fluid levels. Changes may represent early obstruction. No free intra-abdominal air. No radiopaque stones. Degenerative changes in the spine.  IMPRESSION: No evidence of active pulmonary disease. Gas-filled nondistended small bowel without air-fluid levels. Paucity of gas in the colon. Possible early small bowel obstruction.   Electronically Signed   By: Lucienne Capers M.D.   On: 07/30/2014 04:09     EKG Interpretation None      MDM   Final diagnoses:  Nausea and vomiting, vomiting of unspecified type  Abdominal cramping, generalized  Dehydration    New Prescriptions   METOCLOPRAMIDE (REGLAN) 10 MG TABLET    Take 1 tablet (10 mg total) by mouth every 6 (six) hours as needed for nausea (abdominal cramping).   ONDANSETRON (ZOFRAN) 4 MG TABLET    Take 1 tablet (4 mg total) by mouth every 8 (eight) hours as needed for nausea or vomiting.    Plan discharge  Rolland Porter, MD, Alanson Aly, MD 07/30/14 [phone]

## 2014-07-30 NOTE — ED Notes (Signed)
Pt drinking sips of water.

## 2014-07-30 NOTE — ED Notes (Signed)
Assisted pt to restroom  

## 2014-07-30 NOTE — Discharge Instructions (Signed)
Drink plenty of fluids. Use the zofran for nausea and vomiting and the reglan for abdominal cramping. Avoid fried, spicy or greasy foods for the next week. If you develop diarrhea, use imodium OTC.  Recheck if you get a fever, or have uncontrolled vomiting or pain again.     Nausea and Vomiting Nausea is a sick feeling that often comes before throwing up (vomiting). Vomiting is a reflex where stomach contents come out of your mouth. Vomiting can cause severe loss of body fluids (dehydration). Children and elderly adults can become dehydrated quickly, especially if they also have diarrhea. Nausea and vomiting are symptoms of a condition or disease. It is important to find the cause of your symptoms. CAUSES   Direct irritation of the stomach lining. This irritation can result from increased acid production (gastroesophageal reflux disease), infection, food poisoning, taking certain medicines (such as nonsteroidal anti-inflammatory drugs), alcohol use, or tobacco use.  Signals from the brain.These signals could be caused by a headache, heat exposure, an inner ear disturbance, increased pressure in the brain from injury, infection, a tumor, or a concussion, pain, emotional stimulus, or metabolic problems.  An obstruction in the gastrointestinal tract (bowel obstruction).  Illnesses such as diabetes, hepatitis, gallbladder problems, appendicitis, kidney problems, cancer, sepsis, atypical symptoms of a heart attack, or eating disorders.  Medical treatments such as chemotherapy and radiation.  Receiving medicine that makes you sleep (general anesthetic) during surgery. DIAGNOSIS Your caregiver may ask for tests to be done if the problems do not improve after a few days. Tests may also be done if symptoms are severe or if the reason for the nausea and vomiting is not clear. Tests may include:  Urine tests.  Blood tests.  Stool tests.  Cultures (to look for evidence of infection).  X-rays or  other imaging studies. Test results can help your caregiver make decisions about treatment or the need for additional tests. TREATMENT You need to stay well hydrated. Drink frequently but in small amounts.You may wish to drink water, sports drinks, clear broth, or eat frozen ice pops or gelatin dessert to help stay hydrated.When you eat, eating slowly may help prevent nausea.There are also some antinausea medicines that may help prevent nausea. HOME CARE INSTRUCTIONS   Take all medicine as directed by your caregiver.  If you do not have an appetite, do not force yourself to eat. However, you must continue to drink fluids.  If you have an appetite, eat a normal diet unless your caregiver tells you differently.  Eat a variety of complex carbohydrates (rice, wheat, potatoes, bread), lean meats, yogurt, fruits, and vegetables.  Avoid high-fat foods because they are more difficult to digest.  Drink enough water and fluids to keep your urine clear or pale yellow.  If you are dehydrated, ask your caregiver for specific rehydration instructions. Signs of dehydration may include:  Severe thirst.  Dry lips and mouth.  Dizziness.  Dark urine.  Decreasing urine frequency and amount.  Confusion.  Rapid breathing or pulse. SEEK IMMEDIATE MEDICAL CARE IF:   You have blood or brown flecks (like coffee grounds) in your vomit.  You have black or bloody stools.  You have a severe headache or stiff neck.  You are confused.  You have severe abdominal pain.  You have chest pain or trouble breathing.  You do not urinate at least once every 8 hours.  You develop cold or clammy skin.  You continue to vomit for longer than 24 to 48 hours.  You have a fever. MAKE SURE YOU:   Understand these instructions.  Will watch your condition.  Will get help right away if you are not doing well or get worse. Document Released: 08/09/2005 Document Revised: 11/01/2011 Document Reviewed:  01/06/2011 [facility] Patient Information 2015 [place], [place]. This information is not intended to replace advice given to you by your health care provider. Make sure you discuss any questions you have with your health care provider.

## 2014-07-30 NOTE — ED Notes (Signed)
Pt appears to be more awake and alert at this time.  Removed oxygen and will monitor to  Make sure pt maintains 02 sat.  Dr. Vanita Panda aware pt's 02 sat decreased while sleeping.  Pt says feels much better.

## 2014-07-30 NOTE — ED Notes (Signed)
1st bag of IVF still infusing.  IV site WNL.

## 2014-07-30 NOTE — ED Provider Notes (Signed)
Patient awake and alert, tolerating oral intake. Oxygen saturation 95% when awake. D/C home w outpatient f/u.   Carmin Muskrat, MD 07/30/14 [phone]

## 2014-07-30 NOTE — Care Management Note (Signed)
Patient was noted to not have a PCP listed, but per patient PCP is Dr Legrand Rams. Entered this information into computer.

## 2014-07-30 NOTE — ED Notes (Signed)
Patient ambulatory to restroom  ?

## 2014-07-30 NOTE — ED Notes (Signed)
Patient states abdominal pain that started this evening. Patient states it feels like gas. Patient states emesis X3 in 24 hrs. Patient states last bowel movement was on her way here. Patient states it is soft, but not diarrhea. Patients pain comes and goes.

## 2014-07-30 NOTE — ED Notes (Signed)
Pt reporting nausea, vomiting and diarrhea beginning today.  Reports generalized abdominal pain. Denies fever.

## 2014-08-19 ENCOUNTER — Encounter (HOSPITAL_COMMUNITY): Payer: Self-pay | Admitting: *Deleted

## 2014-08-19 ENCOUNTER — Emergency Department (HOSPITAL_COMMUNITY)
Admission: EM | Admit: 2014-08-19 | Discharge: 2014-08-20 | Disposition: A | Discharge status: Home / Self Care | Payer: Medicare (Managed Care) | Attending: Emergency Medicine | Admitting: Emergency Medicine

## 2014-08-19 DIAGNOSIS — R42 Dizziness and giddiness: Secondary | ICD-10-CM | POA: Insufficient documentation

## 2014-08-19 DIAGNOSIS — Z79899 Other long term (current) drug therapy: Secondary | ICD-10-CM | POA: Insufficient documentation

## 2014-08-19 DIAGNOSIS — R51 Headache: Secondary | ICD-10-CM | POA: Diagnosis not present

## 2014-08-19 DIAGNOSIS — Z791 Long term (current) use of non-steroidal anti-inflammatories (NSAID): Secondary | ICD-10-CM | POA: Insufficient documentation

## 2014-08-19 DIAGNOSIS — E780 Pure hypercholesterolemia: Secondary | ICD-10-CM | POA: Insufficient documentation

## 2014-08-19 DIAGNOSIS — Z90710 Acquired absence of both cervix and uterus: Secondary | ICD-10-CM | POA: Insufficient documentation

## 2014-08-19 DIAGNOSIS — Z7982 Long term (current) use of aspirin: Secondary | ICD-10-CM | POA: Diagnosis not present

## 2014-08-19 DIAGNOSIS — K529 Noninfective gastroenteritis and colitis, unspecified: Secondary | ICD-10-CM | POA: Diagnosis not present

## 2014-08-19 DIAGNOSIS — I10 Essential (primary) hypertension: Secondary | ICD-10-CM | POA: Diagnosis not present

## 2014-08-19 DIAGNOSIS — R1084 Generalized abdominal pain: Secondary | ICD-10-CM | POA: Diagnosis present

## 2014-08-19 DIAGNOSIS — E119 Type 2 diabetes mellitus without complications: Secondary | ICD-10-CM | POA: Insufficient documentation

## 2014-08-19 DIAGNOSIS — Z87891 Personal history of nicotine dependence: Secondary | ICD-10-CM | POA: Insufficient documentation

## 2014-08-19 MED ORDER — SODIUM CHLORIDE 0.9 % IV BOLUS (SEPSIS)
1000.0000 mL | Freq: Once | INTRAVENOUS | Status: AC
  Administered 2014-08-19: 1000 mL via INTRAVENOUS

## 2014-08-19 MED ORDER — KETOROLAC TROMETHAMINE 30 MG/ML IJ SOLN
30.0000 mg | Freq: Once | INTRAMUSCULAR | Status: AC
  Administered 2014-08-20: 30 mg via INTRAVENOUS
  Filled 2014-08-19: qty 1

## 2014-08-19 MED ORDER — ONDANSETRON HCL 4 MG/2ML IJ SOLN
4.0000 mg | Freq: Once | INTRAMUSCULAR | Status: AC
  Administered 2014-08-20: 4 mg via INTRAVENOUS
  Filled 2014-08-19: qty 2

## 2014-08-19 NOTE — ED Provider Notes (Signed)
CSN: [id number]     Arrival date & time 08/19/14  2154 History  This chart was scribed for Nicole Speak, MD by Peyton Bottoms, ED Scribe. This patient was seen in room APA09/APA09 and the patient's care was started at 11:17 PM.   Chief Complaint  Patient presents with  . Abdominal Pain   Patient is a 68 y.o. female presenting with abdominal pain and headaches. The history is provided by the patient. No language interpreter was used.  Abdominal Pain Pain location:  Generalized Pain quality: aching   Pain radiates to:  Does not radiate Pain severity:  Moderate Associated symptoms: nausea   Associated symptoms: no vomiting   Headache Pain location:  Generalized Radiates to:  Does not radiate Associated symptoms: abdominal pain, dizziness and nausea   Associated symptoms: no vomiting     HPI Comments: GENNETT Horn is a 68 y.o. female with a history of Diabetes, Hypertension, High cholesterol, and Abdominal hysterectomy, who presents to the Emergency Department complaining of generalized abdominal pain that began yesterday. Patient reports associated headache and dizziness. She states that she was not able to sleep last night due to increased pain. Patient states that her symptoms partially improved earlier today but gradually worsened again a few hours ago with onset of dizziness and headache again. She denies sick contacts. Patient reports associated nausea, and loose stools. She denies associated vomiting or hematochezia. She states that she was seen in ED about 1 week ago for similar symptoms.  Past Medical History  Diagnosis Date  . Diabetes mellitus without complication   . Hypertension   . High cholesterol    Past Surgical History  Procedure Laterality Date  . Cyst removal trunk    . Multiple cyst removal surgeries    . Abdominal hysterectomy     History reviewed. No pertinent family history. History  Substance Use Topics  . Smoking status: Former Research scientist (life sciences)  . Smokeless  tobacco: Not on file  . Alcohol Use: Yes     Comment: very rare   OB History    No data available     Review of Systems  Gastrointestinal: Positive for nausea and abdominal pain. Negative for vomiting.  Neurological: Positive for dizziness and headaches.  All other systems reviewed and are negative.  Allergies  Review of patient's allergies indicates no known allergies.  Home Medications   Prior to Admission medications   Medication Sig Start Date End Date Taking? Authorizing Provider  aspirin EC 81 MG tablet Take 81 mg by mouth daily.    Historical Provider, MD  cyclobenzaprine (FLEXERIL) 5 MG tablet Take 1 tablet (5 mg total) by mouth 3 (three) times daily as needed for muscle spasms. Patient not taking: Reported on 07/30/2014 07/14/13   Janice Norrie, MD  hydrALAZINE (APRESOLINE) 25 MG tablet Take 25 mg by mouth every 8 (eight) hours.    Historical Provider, MD  lovastatin (MEVACOR) 40 MG tablet Take 40 mg by mouth at bedtime.    Historical Provider, MD  metoCLOPramide (REGLAN) 10 MG tablet Take 1 tablet (10 mg total) by mouth every 6 (six) hours as needed for nausea (abdominal cramping). 07/30/14   Janice Norrie, MD  naproxen (NAPROSYN) 375 MG tablet Take 1 tablet (375 mg total) by mouth 2 (two) times daily. 07/14/13   Janice Norrie, MD  ondansetron (ZOFRAN) 4 MG tablet Take 1 tablet (4 mg total) by mouth every 8 (eight) hours as needed for nausea or vomiting. 07/30/14   Iva  Harmon Dun, MD  quinapril (ACCUPRIL) 10 MG tablet Take 10 mg by mouth daily.    Historical Provider, MD  sulfamethoxazole-trimethoprim (SEPTRA DS) 800-160 MG per tablet Take 1 tablet by mouth every 12 (twelve) hours. Patient not taking: Reported on 07/30/2014 07/14/13   Janice Norrie, MD   Triage Vitals: BP 224/70 mmHg  Pulse 76  Temp(Src) 98.6 F (37 C) (Oral)  Resp 20  Ht 5' (1.524 m)  Wt 167 lb (75.751 kg)  BMI 32.62 kg/m2  SpO2 99%  Physical Exam  Constitutional: She is oriented to person, place, and time. She  appears well-developed and well-nourished. No distress.  HENT:  Head: Normocephalic.  Mouth/Throat: Oropharynx is clear and moist.  Eyes: Pupils are equal, round, and reactive to light.  Neck: Neck supple.  Cardiovascular: Normal rate, regular rhythm and normal heart sounds.   Pulmonary/Chest: Effort normal and breath sounds normal. No respiratory distress. She has no wheezes.  Abdominal: Soft. She exhibits no distension. There is tenderness. There is no rebound and no guarding.  Mild tenderness in all 4 quadrants.   Musculoskeletal: She exhibits no edema or tenderness.  Neurological: She is alert and oriented to person, place, and time.  Skin: Skin is warm and dry.  Psychiatric: She has a normal mood and affect.  Nursing note and vitals reviewed.  ED Course  Procedures (including critical care time)  DIAGNOSTIC STUDIES: Oxygen Saturation is 99% on RA, normal by my interpretation.    COORDINATION OF CARE: 11:21 PM- Discussed plans to order diagnostic lab work. Will give patient IV fluids, Toradol and Zofran. Pt advised of plan for treatment and pt agrees.  Labs Review Labs Reviewed  COMPREHENSIVE METABOLIC PANEL  LIPASE, BLOOD  CBC WITH DIFFERENTIAL  URINALYSIS, ROUTINE W REFLEX MICROSCOPIC   Imaging Review No results found.   EKG Interpretation None     MDM   Final diagnoses:  None    The patient's presentation, physical examination, and workup are consistent with a viral gastroenteritis. She is feeling better with medications and IV fluids. I believe she is appropriate for discharge. I will prescribe Zofran as needed for nausea and she is to return as needed for any problems.  I personally performed the services described in this documentation, which was scribed in my presence. The recorded information has been reviewed and is accurate.  Nicole Speak, MD 08/20/14 [phone]

## 2014-08-19 NOTE — ED Notes (Addendum)
Pt reporting some generalized abdominal pain, nausea and vomiting since last night. Denies diarrhea.  Reporting some constipation. Pt also reporting generalized weakness.  Unsure if she has had a fever or not. Pt's BP elevated in triage.  States she has not taken her BP medications tonight.

## 2014-08-20 LAB — COMPREHENSIVE METABOLIC PANEL
ALK PHOS: 107 U/L (ref 39–117)
ALT: 25 U/L (ref 0–35)
AST: 21 U/L (ref 0–37)
Albumin: 4.5 g/dL (ref 3.5–5.2)
Anion gap: 6 (ref 5–15)
BUN: 15 mg/dL (ref 6–23)
CALCIUM: 9.4 mg/dL (ref 8.4–10.5)
CO2: 29 mmol/L (ref 19–32)
Chloride: 106 mEq/L (ref 96–112)
Creatinine, Ser: 0.95 mg/dL (ref 0.50–1.10)
GFR, EST AFRICAN AMERICAN: 70 mL/min — AB (ref >90)
GFR, EST NON AFRICAN AMERICAN: 61 mL/min — AB (ref >90)
GLUCOSE: 104 mg/dL — AB (ref 70–99)
POTASSIUM: 3.7 mmol/L (ref 3.5–5.1)
Sodium: 141 mmol/L (ref 135–145)
Total Bilirubin: 0.3 mg/dL (ref 0.3–1.2)
Total Protein: 8 g/dL (ref 6.0–8.3)

## 2014-08-20 LAB — URINALYSIS, ROUTINE W REFLEX MICROSCOPIC
Bilirubin Urine: NEGATIVE
Glucose, UA: NEGATIVE mg/dL
KETONES UR: NEGATIVE mg/dL
LEUKOCYTES UA: NEGATIVE
Nitrite: NEGATIVE
PROTEIN: NEGATIVE mg/dL
Specific Gravity, Urine: 1.01 (ref 1.005–1.030)
UROBILINOGEN UA: 0.2 mg/dL (ref 0.0–1.0)
pH: 6 (ref 5.0–8.0)

## 2014-08-20 LAB — CBC WITH DIFFERENTIAL/PLATELET
BASOS PCT: 0 % (ref 0–1)
Basophils Absolute: 0 10*3/uL (ref 0.0–0.1)
EOS PCT: 1 % (ref 0–5)
Eosinophils Absolute: 0 10*3/uL (ref 0.0–0.7)
HCT: 44.4 % (ref 36.0–46.0)
Hemoglobin: 14.2 g/dL (ref 12.0–15.0)
Lymphocytes Relative: 46 % (ref 12–46)
Lymphs Abs: 2.7 10*3/uL (ref 0.7–4.0)
MCH: 26.6 pg (ref 26.0–34.0)
MCHC: 32 g/dL (ref 30.0–36.0)
MCV: 83.3 fL (ref 78.0–100.0)
Monocytes Absolute: 0.4 10*3/uL (ref 0.1–1.0)
Monocytes Relative: 7 % (ref 3–12)
Neutro Abs: 2.6 10*3/uL (ref 1.7–7.7)
Neutrophils Relative %: 46 % (ref 43–77)
Platelets: 306 10*3/uL (ref 150–400)
RBC: 5.33 MIL/uL — ABNORMAL HIGH (ref 3.87–5.11)
RDW: 13.6 % (ref 11.5–15.5)
WBC: 5.7 10*3/uL (ref 4.0–10.5)

## 2014-08-20 LAB — URINE MICROSCOPIC-ADD ON

## 2014-08-20 LAB — LIPASE, BLOOD: Lipase: 20 U/L (ref 11–59)

## 2014-08-20 MED ORDER — ONDANSETRON 8 MG PO TBDP
ORAL_TABLET | ORAL | Status: DC
Start: 2014-08-20 — End: 2015-02-20

## 2014-08-20 NOTE — Discharge Instructions (Signed)
Zofran as needed for nausea.  Return to the emergency department for severe abdominal pain, bloody stool, or other new and concerning symptoms.   Viral Gastroenteritis Viral gastroenteritis is also known as stomach flu. This condition affects the stomach and intestinal tract. It can cause sudden diarrhea and vomiting. The illness typically lasts 3 to 8 days. Most people develop an immune response that eventually gets rid of the virus. While this natural response develops, the virus can make you quite ill. CAUSES  Many different viruses can cause gastroenteritis, such as rotavirus or noroviruses. You can catch one of these viruses by consuming contaminated food or water. You may also catch a virus by sharing utensils or other personal items with an infected person or by touching a contaminated surface. SYMPTOMS  The most common symptoms are diarrhea and vomiting. These problems can cause a severe loss of body fluids (dehydration) and a body salt (electrolyte) imbalance. Other symptoms may include:  Fever.  Headache.  Fatigue.  Abdominal pain. DIAGNOSIS  Your caregiver can usually diagnose viral gastroenteritis based on your symptoms and a physical exam. A stool sample may also be taken to test for the presence of viruses or other infections. TREATMENT  This illness typically goes away on its own. Treatments are aimed at rehydration. The most serious cases of viral gastroenteritis involve vomiting so severely that you are not able to keep fluids down. In these cases, fluids must be given through an intravenous line (IV). HOME CARE INSTRUCTIONS   Drink enough fluids to keep your urine clear or pale yellow. Drink small amounts of fluids frequently and increase the amounts as tolerated.  Ask your caregiver for specific rehydration instructions.  Avoid:  Foods high in sugar.  Alcohol.  Carbonated drinks.  Tobacco.  Juice.  Caffeine drinks.  Extremely hot or cold fluids.  Fatty,  greasy foods.  Too much intake of anything at one time.  Dairy products until 24 to 48 hours after diarrhea stops.  You may consume probiotics. Probiotics are active cultures of beneficial bacteria. They may lessen the amount and number of diarrheal stools in adults. Probiotics can be found in yogurt with active cultures and in supplements.  Wash your hands well to avoid spreading the virus.  Only take over-the-counter or prescription medicines for pain, discomfort, or fever as directed by your caregiver. Do not give aspirin to children. Antidiarrheal medicines are not recommended.  Ask your caregiver if you should continue to take your regular prescribed and over-the-counter medicines.  Keep all follow-up appointments as directed by your caregiver. SEEK IMMEDIATE MEDICAL CARE IF:   You are unable to keep fluids down.  You do not urinate at least once every 6 to 8 hours.  You develop shortness of breath.  You notice blood in your stool or vomit. This may look like coffee grounds.  You have abdominal pain that increases or is concentrated in one small area (localized).  You have persistent vomiting or diarrhea.  You have a fever.  The patient is a child younger than 3 months, and he or she has a fever.  The patient is a child older than 3 months, and he or she has a fever and persistent symptoms.  The patient is a child older than 3 months, and he or she has a fever and symptoms suddenly get worse.  The patient is a baby, and he or she has no tears when crying. MAKE SURE YOU:   Understand these instructions.  Will watch your  condition.  Will get help right away if you are not doing well or get worse. Document Released: 08/09/2005 Document Revised: 11/01/2011 Document Reviewed: 05/26/2011 [facility] Patient Information 2015 [place], [place]. This information is not intended to replace advice given to you by your health care provider. Make sure you discuss any questions you  have with your health care provider.

## 2015-01-03 ENCOUNTER — Emergency Department (HOSPITAL_COMMUNITY)
Admission: EM | Admit: 2015-01-03 | Discharge: 2015-01-04 | Disposition: A | Discharge status: Home / Self Care | Payer: PRIVATE HEALTH INSURANCE | Attending: Emergency Medicine | Admitting: Emergency Medicine

## 2015-01-03 ENCOUNTER — Encounter (HOSPITAL_COMMUNITY): Payer: Self-pay | Admitting: *Deleted

## 2015-01-03 DIAGNOSIS — N939 Abnormal uterine and vaginal bleeding, unspecified: Secondary | ICD-10-CM | POA: Diagnosis not present

## 2015-01-03 DIAGNOSIS — E780 Pure hypercholesterolemia: Secondary | ICD-10-CM | POA: Diagnosis not present

## 2015-01-03 DIAGNOSIS — Z7982 Long term (current) use of aspirin: Secondary | ICD-10-CM | POA: Diagnosis not present

## 2015-01-03 DIAGNOSIS — N898 Other specified noninflammatory disorders of vagina: Secondary | ICD-10-CM | POA: Insufficient documentation

## 2015-01-03 DIAGNOSIS — Z791 Long term (current) use of non-steroidal anti-inflammatories (NSAID): Secondary | ICD-10-CM | POA: Diagnosis not present

## 2015-01-03 DIAGNOSIS — E119 Type 2 diabetes mellitus without complications: Secondary | ICD-10-CM | POA: Diagnosis not present

## 2015-01-03 DIAGNOSIS — Z79899 Other long term (current) drug therapy: Secondary | ICD-10-CM | POA: Insufficient documentation

## 2015-01-03 DIAGNOSIS — Z87891 Personal history of nicotine dependence: Secondary | ICD-10-CM | POA: Diagnosis not present

## 2015-01-03 DIAGNOSIS — I10 Essential (primary) hypertension: Secondary | ICD-10-CM | POA: Insufficient documentation

## 2015-01-03 DIAGNOSIS — M545 Low back pain: Secondary | ICD-10-CM | POA: Diagnosis present

## 2015-01-03 LAB — CBC WITH DIFFERENTIAL/PLATELET
Basophils Absolute: 0 10*3/uL (ref 0.0–0.1)
Basophils Relative: 1 % (ref 0–1)
Eosinophils Absolute: 0.2 10*3/uL (ref 0.0–0.7)
Eosinophils Relative: 3 % (ref 0–5)
HCT: 41.3 % (ref 36.0–46.0)
Hemoglobin: 13.1 g/dL (ref 12.0–15.0)
LYMPHS PCT: 46 % (ref 12–46)
Lymphs Abs: 2.6 10*3/uL (ref 0.7–4.0)
MCH: 26.5 pg (ref 26.0–34.0)
MCHC: 31.7 g/dL (ref 30.0–36.0)
MCV: 83.4 fL (ref 78.0–100.0)
Monocytes Absolute: 0.4 10*3/uL (ref 0.1–1.0)
Monocytes Relative: 7 % (ref 3–12)
NEUTROS ABS: 2.4 10*3/uL (ref 1.7–7.7)
NEUTROS PCT: 43 % (ref 43–77)
Platelets: 280 10*3/uL (ref 150–400)
RBC: 4.95 MIL/uL (ref 3.87–5.11)
RDW: 13.4 % (ref 11.5–15.5)
WBC: 5.6 10*3/uL (ref 4.0–10.5)

## 2015-01-03 NOTE — ED Provider Notes (Signed)
CSN: [id number]     Arrival date & time 01/03/15  2113 History  This chart was scribed for Nicole Bo, MD by Nicole Horn, ED Scribe. This patient was seen in room APA11/APA11 and the patient's care was started at 10:54 PM.  Chief Complaint  Patient presents with  . Back Pain   Patient is a 69 y.o. female presenting with back pain. The history is provided by the patient and medical records. No language interpreter was used.  Back Pain   HPI Comments:  Nicole Horn is a 69 y.o. female with PMHx of back pain who presents to the Emergency Department complaining of left-sided lower back pain that began approximately one week ago. She reports vaginal bleeding, since having sexual intercourse yesterday, but is unsure if it is from an abscess or not. She states she felt a nodule inside her vagina about two days ago. She has not done anything to treat her symptoms. Movements makes the back pain worse. Denies alleviating factors. Reports having a cardiac catheterization approxiamtely 4-5 years ago that resulted in "some fatty tissue in the arteries". She has h/o hysterectomy secondary to fibroid tumors. Denies urinary complaints, nausea, vomiting, diarrhea or constipation. Denies any recent trauma, injury or fall. PMHx of DM, HTN and HLD.  Past Medical History  Diagnosis Date  . Diabetes mellitus without complication   . Hypertension   . High cholesterol    Past Surgical History  Procedure Laterality Date  . Cyst removal trunk    . Multiple cyst removal surgeries    . Abdominal hysterectomy     History reviewed. No pertinent family history. History  Substance Use Topics  . Smoking status: Former Smoker    Types: Cigarettes  . Smokeless tobacco: Not on file  . Alcohol Use: Yes     Comment: very rare   OB History    No data available     Review of Systems  Musculoskeletal: Positive for back pain.  All other systems reviewed and are negative.   Allergies  Review of  patient's allergies indicates no known allergies.  Home Medications   Prior to Admission medications   Medication Sig Start Date End Date Taking? Authorizing Provider  aspirin EC 81 MG tablet Take 81 mg by mouth daily.   Yes Historical Provider, MD  hydrALAZINE (APRESOLINE) 25 MG tablet Take 25 mg by mouth every 8 (eight) hours.   Yes Historical Provider, MD  lovastatin (MEVACOR) 40 MG tablet Take 40 mg by mouth at bedtime.   Yes Historical Provider, MD  quinapril (ACCUPRIL) 10 MG tablet Take 10 mg by mouth daily.   Yes Historical Provider, MD  cyclobenzaprine (FLEXERIL) 5 MG tablet Take 1 tablet (5 mg total) by mouth 3 (three) times daily as needed for muscle spasms. Patient not taking: Reported on 07/30/2014 07/14/13   Rolland Porter, MD  metoCLOPramide (REGLAN) 10 MG tablet Take 1 tablet (10 mg total) by mouth every 6 (six) hours as needed for nausea (abdominal cramping). Patient not taking: Reported on 01/03/2015 07/30/14   Rolland Porter, MD  naproxen (NAPROSYN) 375 MG tablet Take 1 tablet (375 mg total) by mouth 2 (two) times daily. Patient not taking: Reported on 01/03/2015 07/14/13   Rolland Porter, MD  ondansetron (ZOFRAN ODT) 8 MG disintegrating tablet 8mg  ODT q4 hours prn nausea Patient not taking: Reported on 01/03/2015 08/20/14   Veryl Speak, MD  ondansetron (ZOFRAN) 4 MG tablet Take 1 tablet (4 mg total) by mouth every 8 (eight) hours  as needed for nausea or vomiting. Patient not taking: Reported on 01/03/2015 07/30/14   Rolland Porter, MD  sulfamethoxazole-trimethoprim (SEPTRA DS) 800-160 MG per tablet Take 1 tablet by mouth every 12 (twelve) hours. Patient not taking: Reported on 07/30/2014 07/14/13   Rolland Porter, MD   Triage Vitals: BP 202/85 mmHg  Pulse 82  Temp(Src) 98.2 F (36.8 C) (Oral)  Resp 20  SpO2 99% Physical Exam  Constitutional: She is oriented to person, place, and time. She appears well-developed and well-nourished.  HENT:  Head: Normocephalic and atraumatic.  Eyes: Conjunctivae  and EOM are normal. Pupils are equal, round, and reactive to light.  Neck: Normal range of motion and phonation normal. Neck supple.  Cardiovascular: Normal rate and regular rhythm.   Pulmonary/Chest: Effort normal and breath sounds normal. She exhibits no tenderness.  Abdominal: Soft. She exhibits no distension. There is no tenderness. There is no guarding.  Genitourinary:  Mild left CVA tenderness with percussion. Normal external female genitalia. Soft mass, left anterior vaginal wall, 3 cm in diameter, without pointing or tenderness. On the vaginal mucosa of this mass is an irregular abrasion, superficial, that is bleeding, and likely the source of her vaginal bleeding. On speculum exam, there is no other vaginal lesion. There is an atrophic cervix, consistent with her remote hysterectomy.  Musculoskeletal: Normal range of motion.  Left lumbar tenderness.  Neurological: She is alert and oriented to person, place, and time. She exhibits normal muscle tone.  Skin: Skin is warm and dry.  Psychiatric: She has a normal mood and affect. Her behavior is normal. Judgment and thought content normal.  Nursing note and vitals reviewed.   ED Course  Procedures (including critical care time)  Medications - No data to display  Patient Vitals for the past 24 hrs:  BP Temp Temp src Pulse Resp SpO2 Height Weight  01/03/15 2204 - - - - - - 5' (1.524 m) 159 lb 5 oz (72.264 kg)  01/03/15 2120 (!) 202/85 mmHg 98.2 F (36.8 C) Oral 82 20 99 % - -       DIAGNOSTIC STUDIES: Oxygen Saturation is 99% on RA, normal by my interpretation.   COORDINATION OF CARE: 11:01 PM- Will order urinalysis and perform pelvic exam. Pt verbalizes understanding and agrees to plan.  Medications - No data to display  Labs Review Labs Reviewed - No data to display  Imaging Review No results found.   EKG Interpretation None      MDM   Final diagnoses:  Vaginal bleeding  Vaginal cyst   Vaginal bleeding  secondary to vaginal wall abrasion. Nonspecified vaginal cyst, likely not infected.   Nursing Notes Reviewed/ Care Coordinated Applicable Imaging Reviewed Interpretation of Laboratory Data incorporated into ED treatment  The patient appears reasonably screened and/or stabilized for discharge and I doubt any other medical condition or other [facility] requiring further screening, evaluation, or treatment in the ED at this time prior to discharge.  Plan: Home Medications- usual; Home Treatments- warm soaks BID; return here if the recommended treatment, does not improve the symptoms; Recommended follow up- PCP prn   I personally performed the services described in this documentation, which was scribed in my presence. The recorded information has been reviewed and is accurate.   Nicole Bo, MD 01/04/15 [phone]

## 2015-01-03 NOTE — ED Notes (Signed)
Lt lower back pain for 1 week and worse tonight.  Started vaginal bleeding tonight.

## 2015-01-03 NOTE — ED Notes (Signed)
C/o back pain for a week, denies injury; tonight with bright red vaginal bleeding, hx of hysterectomy

## 2015-01-04 LAB — URINALYSIS, ROUTINE W REFLEX MICROSCOPIC
Bilirubin Urine: NEGATIVE
GLUCOSE, UA: NEGATIVE mg/dL
KETONES UR: NEGATIVE mg/dL
Leukocytes, UA: NEGATIVE
Nitrite: NEGATIVE
Protein, ur: NEGATIVE mg/dL
Specific Gravity, Urine: 1.02 (ref 1.005–1.030)
Urobilinogen, UA: 0.2 mg/dL (ref 0.0–1.0)
pH: 6 (ref 5.0–8.0)

## 2015-01-04 LAB — URINE MICROSCOPIC-ADD ON

## 2015-01-04 LAB — BASIC METABOLIC PANEL
ANION GAP: 8 (ref 5–15)
BUN: 18 mg/dL (ref 6–20)
CO2: 29 mmol/L (ref 22–32)
Calcium: 9 mg/dL (ref 8.9–10.3)
Chloride: 107 mmol/L (ref 101–111)
Creatinine, Ser: 0.97 mg/dL (ref 0.44–1.00)
GFR calc Af Amer: >60 mL/min (ref >60)
GFR, EST NON AFRICAN AMERICAN: 59 mL/min — AB (ref >60)
Glucose, Bld: 107 mg/dL — ABNORMAL HIGH (ref 65–99)
POTASSIUM: 3.8 mmol/L (ref 3.5–5.1)
SODIUM: 144 mmol/L (ref 135–145)

## 2015-01-04 NOTE — ED Notes (Signed)
Called to lab to check on urine, was advised not run yet would be another 10-15 minutes. EDP notified.

## 2015-01-04 NOTE — ED Notes (Signed)
Pt alert & oriented x4, stable gait. Patient given discharge instructions, paperwork & prescription(s). Patient  instructed to stop at the registration desk to finish any additional paperwork. Patient verbalized understanding. Pt left department w/ no further questions. 

## 2015-01-04 NOTE — Discharge Instructions (Signed)
Vaginal bleeding is secondary to an abrasion. This should heal up in a few days. Soak in a warm tub twice a day to help keep the area clean and encourage healing.  There is a cyst of the left vaginal wall, that may resolve spontaneously. If it remains for 1 month, or becomes painful, you should see your doctor or gynecologist for further treatment.

## 2015-01-23 ENCOUNTER — Ambulatory Visit (INDEPENDENT_AMBULATORY_CARE_PROVIDER_SITE_OTHER): Payer: Medicare (Managed Care) | Admitting: Obstetrics and Gynecology

## 2015-01-23 ENCOUNTER — Encounter: Payer: Self-pay | Admitting: Obstetrics and Gynecology

## 2015-01-23 ENCOUNTER — Telehealth: Payer: Self-pay | Admitting: Obstetrics and Gynecology

## 2015-01-23 VITALS — BP 172/82 | HR 60 | Ht 60.0 in | Wt 161.0 lb

## 2015-01-23 DIAGNOSIS — A499 Bacterial infection, unspecified: Secondary | ICD-10-CM

## 2015-01-23 MED ORDER — DOXYCYCLINE HYCLATE 100 MG PO CAPS: 100.0000 mg | ORAL_CAPSULE | Freq: Two times a day (BID) | ORAL | Status: DC
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
## 2015-01-24 ENCOUNTER — Encounter: Payer: Self-pay | Admitting: Obstetrics and Gynecology

## 2015-01-24 LAB — RPR: RPR: NONREACTIVE

## 2015-01-24 LAB — HEPATITIS C ANTIBODY: Hep C Virus Ab: <0.1 s/co ratio (ref 0.0–0.9)

## 2015-01-24 LAB — HEPATITIS B SURFACE ANTIGEN: HEP B S AG: NEGATIVE

## 2015-01-24 LAB — HIV ANTIBODY (ROUTINE TESTING W REFLEX): HIV Screen 4th Generation wRfx: NONREACTIVE

## 2015-01-24 LAB — HSV 2 ANTIBODY, IGG: HSV 2 Glycoprotein G Ab, IgG: 13.7 index — ABNORMAL HIGH (ref 0.00–0.90)

## 2015-01-24 NOTE — Telephone Encounter (Signed)
Effort made to contact pt to come back to office to get Rx

## 2015-01-25 ENCOUNTER — Emergency Department (HOSPITAL_COMMUNITY)
Admission: EM | Admit: 2015-01-25 | Discharge: 2015-01-25 | Disposition: A | Discharge status: Home / Self Care | Payer: Medicare (Managed Care) | Attending: Emergency Medicine | Admitting: Emergency Medicine

## 2015-01-25 ENCOUNTER — Encounter (HOSPITAL_COMMUNITY): Payer: Self-pay | Admitting: *Deleted

## 2015-01-25 DIAGNOSIS — N751 Abscess of Bartholin's gland: Secondary | ICD-10-CM | POA: Insufficient documentation

## 2015-01-25 DIAGNOSIS — Z87891 Personal history of nicotine dependence: Secondary | ICD-10-CM | POA: Diagnosis not present

## 2015-01-25 DIAGNOSIS — E119 Type 2 diabetes mellitus without complications: Secondary | ICD-10-CM | POA: Diagnosis not present

## 2015-01-25 DIAGNOSIS — Z79899 Other long term (current) drug therapy: Secondary | ICD-10-CM | POA: Diagnosis not present

## 2015-01-25 DIAGNOSIS — Z7982 Long term (current) use of aspirin: Secondary | ICD-10-CM | POA: Diagnosis not present

## 2015-01-25 DIAGNOSIS — I10 Essential (primary) hypertension: Secondary | ICD-10-CM | POA: Diagnosis not present

## 2015-01-25 DIAGNOSIS — Z791 Long term (current) use of non-steroidal anti-inflammatories (NSAID): Secondary | ICD-10-CM | POA: Diagnosis not present

## 2015-01-25 DIAGNOSIS — Z792 Long term (current) use of antibiotics: Secondary | ICD-10-CM | POA: Diagnosis not present

## 2015-01-25 DIAGNOSIS — Z4689 Encounter for fitting and adjustment of other specified devices: Secondary | ICD-10-CM | POA: Diagnosis present

## 2015-01-25 LAB — GC/CHLAMYDIA PROBE AMP
CHLAMYDIA, DNA PROBE: NEGATIVE
Neisseria gonorrhoeae by PCR: NEGATIVE

## 2015-01-25 MED ORDER — LIDOCAINE-EPINEPHRINE (PF) 1 %-1:200000 IJ SOLN
INTRAMUSCULAR | Status: AC
  Administered 2015-01-25: 10 mL via VAGINAL
  Filled 2015-01-25: qty 10

## 2015-01-25 NOTE — ED Notes (Signed)
Pt states Dr. Glo Herring inserted a catheter to drain a vaginal cyst last week. Pt states catheter came out today. Pt called nurse line and was told to come here in order to prevent infection. Pt states burning with urination as well.

## 2015-01-25 NOTE — ED Provider Notes (Signed)
CSN: [id number]     Arrival date & time 01/25/15  1654 History   First MD Initiated Contact with Patient 01/25/15 1709     Chief Complaint  Patient presents with  . vaginal cath to be replaced       HPI  Asian presents for evaluation after the Word catheter came out of her vaginal abscess. Had incision and drainage, and placement of Word catheter 2 days ago by her OB/GYN Dr. Glo Herring. I thought at home today. She is taking antiemetics at home.  Past Medical History  Diagnosis Date  . Diabetes mellitus without complication   . Hypertension   . High cholesterol    Past Surgical History  Procedure Laterality Date  . Cyst removal trunk    . Multiple cyst removal surgeries    . Abdominal hysterectomy     Family History  Problem Relation Age of Onset  . Cancer Mother     colon  . Cancer Sister     breast  . Cancer Maternal Aunt     stomach   History  Substance Use Topics  . Smoking status: Former Smoker    Types: Cigarettes  . Smokeless tobacco: Never Used  . Alcohol Use: 0.0 oz/week    0 Standard drinks or equivalent per week     Comment: very rare   OB History    No data available     Review of Systems  Constitutional: Negative for fever, chills, diaphoresis, appetite change and fatigue.  HENT: Negative for mouth sores, sore throat and trouble swallowing.   Eyes: Negative for visual disturbance.  Respiratory: Negative for cough, chest tightness, shortness of breath and wheezing.   Cardiovascular: Negative for chest pain.  Gastrointestinal: Negative for nausea, vomiting, abdominal pain, diarrhea and abdominal distention.  Endocrine: Negative for polydipsia, polyphagia and polyuria.  Genitourinary: Positive for vaginal pain. Negative for dysuria, frequency and hematuria.  Musculoskeletal: Negative for gait problem.  Skin: Negative for color change, pallor and rash.  Neurological: Negative for dizziness, syncope, light-headedness and headaches.  Hematological: Does  not bruise/bleed easily.  Psychiatric/Behavioral: Negative for behavioral problems and confusion.      Allergies  Review of patient's allergies indicates no known allergies.  Home Medications   Prior to Admission medications   Medication Sig Start Date End Date Taking? Authorizing Provider  aspirin EC 81 MG tablet Take 81 mg by mouth daily.   Yes Historical Provider, MD  doxycycline (VIBRAMYCIN) 100 MG capsule Take 1 capsule (100 mg total) by mouth 2 (two) times daily. 01/23/15  Yes [name], MD  hydrALAZINE (APRESOLINE) 25 MG tablet Take 25 mg by mouth every 8 (eight) hours.   Yes Historical Provider, MD  lovastatin (MEVACOR) 40 MG tablet Take 40 mg by mouth at bedtime.   Yes Historical Provider, MD  naproxen (NAPROSYN) 375 MG tablet Take 1 tablet (375 mg total) by mouth 2 (two) times daily. 07/14/13  Yes Rolland Porter, MD  quinapril (ACCUPRIL) 10 MG tablet Take 10 mg by mouth daily.   Yes Historical Provider, MD  cyclobenzaprine (FLEXERIL) 5 MG tablet Take 1 tablet (5 mg total) by mouth 3 (three) times daily as needed for muscle spasms. Patient not taking: Reported on 07/30/2014 07/14/13   Rolland Porter, MD  metoCLOPramide (REGLAN) 10 MG tablet Take 1 tablet (10 mg total) by mouth every 6 (six) hours as needed for nausea (abdominal cramping). Patient not taking: Reported on 01/03/2015 07/30/14   Rolland Porter, MD  ondansetron [facility] ODT)  8 MG disintegrating tablet 8mg  ODT q4 hours prn nausea Patient not taking: Reported on 01/25/2015 08/20/14   Veryl Speak, MD  ondansetron (ZOFRAN) 4 MG tablet Take 1 tablet (4 mg total) by mouth every 8 (eight) hours as needed for nausea or vomiting. Patient not taking: Reported on 01/03/2015 07/30/14   Rolland Porter, MD  sulfamethoxazole-trimethoprim (SEPTRA DS) 800-160 MG per tablet Take 1 tablet by mouth every 12 (twelve) hours. Patient not taking: Reported on 07/30/2014 07/14/13   Rolland Porter, MD   BP 160/73 mmHg  Pulse 91  Temp(Src) 98.8 F (37.1 C) (Oral)   Resp 21  SpO2 98% Physical Exam  Constitutional: She is oriented to person, place, and time. She appears well-developed and well-nourished. No distress.  HENT:  Head: Normocephalic.  Eyes: Conjunctivae are normal. Pupils are equal, round, and reactive to light. No scleral icterus.  Neck: Normal range of motion. Neck supple. No thyromegaly present.  Cardiovascular: Normal rate and regular rhythm.  Exam reveals no gallop and no friction rub.   No murmur heard. Pulmonary/Chest: Effort normal and breath sounds normal. No respiratory distress. She has no wheezes. She has no rales.  Abdominal: Soft. Bowel sounds are normal. She exhibits no distension. There is no tenderness. There is no rebound.  Genitourinary:     Musculoskeletal: Normal range of motion.  Neurological: She is alert and oriented to person, place, and time.  Skin: Skin is warm and dry. No rash noted.  Psychiatric: She has a normal mood and affect. Her behavior is normal.    ED Course  Procedures (including critical care time) Labs Review Labs Reviewed - No data to display  Imaging Review No results found.   EKG Interpretation None      MDM   Final diagnoses:  Bartholin's gland abscess    INCISION AND DRAINAGE Performed by: Lolita Patella Consent: Verbal consent obtained. Risks and benefits: risks, benefits and alternatives were discussed Type: abscess  Body area: Lt Labia/Bartholins abscess   Anesthesia: local infiltration  Incision was made with a scalpel.  Local anesthetic: lidocaine 1% with epinephrine  Anesthetic total: 1 ml  Complexity: complex Blunt dissection to break up loculations  Drainage: purulent  Drainage amount: scant/bloody  Packing material: Word Catheter  Patient tolerance: Patient tolerated the procedure well with no immediate complications.       Tanna Furry, MD 01/25/15 [phone]

## 2015-01-25 NOTE — Discharge Instructions (Signed)
Bartholin's Cyst or Abscess °Bartholin's glands are small glands located within the folds of skin (labia) along the sides of the lower opening of the vagina (birth canal). A cyst may develop when the duct of the gland becomes blocked. When this happens, fluid that accumulates within the cyst can become infected. This is known as an abscess. The Bartholin gland produces a mucous fluid to lubricate the outside of the vagina during sexual intercourse. °SYMPTOMS  °· Patients with a small cyst may not have any symptoms. °· Mild discomfort to severe pain depending on the size of the cyst and if it is infected (abscess). °· Pain, redness, and swelling around the lower opening of the vagina. °· Painful intercourse. °· Pressure in the perineal area. °· Swelling of the lips of the vagina (labia). °· The cyst or abscess can be on one side or both sides of the vagina. °DIAGNOSIS  °· A large swelling is seen in the lower vagina area by your caregiver. °· Painful to touch. °· Redness and pain, if it is an abscess. °TREATMENT  °· Sometimes the cyst will go away on its own. °· Apply warm wet compresses to the area or take hot sitz baths several times a day. °· An incision to drain the cyst or abscess with local anesthesia. °· Culture the pus, if it is an abscess. °· Antibiotic treatment, if it is an abscess. °· Cut open the gland and suture the edges to make the opening of the gland bigger (marsupialization). °· Remove the whole gland if the cyst or abscess returns. °PREVENTION  °· Practice good hygiene. °· Clean the vaginal area with a mild soap and soft cloth when bathing. °· Do not rub hard in the vaginal area when bathing. °· Protect the crotch area with a padded cushion if you take long bike rides or ride horses. °· Be sure you are well lubricated when you have sexual intercourse. °HOME CARE INSTRUCTIONS  °· If your cyst or abscess was opened, a small piece of gauze, or a drain, may have been placed in the wound to allow  drainage. Do not remove this gauze or drain unless directed by your caregiver. °· Wear feminine pads, not tampons, as needed for any drainage or bleeding. °· If antibiotics were prescribed, take them exactly as directed. Finish the entire course. °· Only take over-the-counter or prescription medicines for pain, discomfort, or fever as directed by your caregiver. °SEEK IMMEDIATE MEDICAL CARE IF:  °· You have an increase in pain, redness, swelling, or drainage. °· You have bleeding from the wound which results in the use of more than the number of pads suggested by your caregiver in 24 hours. °· You have chills. °· You have a fever. °· You develop any new problems (symptoms) or aggravation of your existing condition. °MAKE SURE YOU:  °· Understand these instructions. °· Will watch your condition. °· Will get help right away if you are not doing well or get worse. °Document Released: 08/09/2005 Document Revised: 11/01/2011 Document Reviewed: 03/27/2008 °[employer]® Patient Information ©2015 [employer]. This information is not intended to replace advice given to you by your health care provider. Make sure you discuss any questions you have with your health care provider. ° °

## 2015-01-27 ENCOUNTER — Telehealth: Payer: Self-pay | Admitting: *Deleted

## 2015-01-27 NOTE — Telephone Encounter (Signed)
Pt informed of HSV II positive results , all other labs normal (RPR, Hep C & B, HIV, GC/CHL)  from this date. Pt requesting an appt to discuss suppression treatment for HSV II. Pt also states catheter that was inserted on 01/22/2015 keep falling out. Pt given an appt to f/u with Dr. Glo Herring.

## 2015-01-27 NOTE — Telephone Encounter (Signed)
-----   Message from [name], MD sent at 01/24/2015  4:57 PM EDT ----- HSV II antibody is positive, indicating prior exposure to HSV II. Pt does not need tx , but she may want to make appt to discuss the pro/con of supression.

## 2015-01-29 ENCOUNTER — Encounter: Payer: Self-pay | Admitting: Obstetrics and Gynecology

## 2015-01-29 ENCOUNTER — Ambulatory Visit (INDEPENDENT_AMBULATORY_CARE_PROVIDER_SITE_OTHER): Payer: Medicare (Managed Care) | Admitting: Obstetrics and Gynecology

## 2015-01-29 VITALS — BP 144/78 | HR 72 | Wt 161.6 lb

## 2015-01-29 DIAGNOSIS — D280 Benign neoplasm of vulva: Secondary | ICD-10-CM | POA: Diagnosis not present

## 2015-01-29 MED ORDER — ACYCLOVIR 400 MG PO TABS: 400.0000 mg | ORAL_TABLET | Freq: Two times a day (BID) | ORAL | Status: DC

## 2015-01-29 NOTE — Progress Notes (Signed)
Patient ID: Nicole Horn, female   DOB: 05-Feb-1946, 69 y.o.   MRN: [id number]   [facility] Visit  Patient name: Nicole Horn MRN [id number]  Date of birth: [birth year]  CC & HPI:  Nicole Horn is a 69 y.o. female presenting today for a follow up for a possible Bartholin's gland cyst/tumor. Patient states the Word catheter I inserted last week fell out after 2 days. She then went to the ED about 4 days ago to have a catheter re-inserted, but it didn't stay in. She also had an I&D done at that time. She states the area has been mostly bleeding. She denies any pain in the area.  Patient notes she has had a flare-up of anogenital warts that only began in the last several weeks.    ROS:  A complete review of systems was obtained and all systems are negative except as noted in the HPI and PMH.    Pertinent History Reviewed:   Reviewed: Significant for abdominal hysterectomy and multiple cyst removal surgeries Medical         Past Medical History  Diagnosis Date  . Diabetes mellitus without complication   . Hypertension   . High cholesterol                               Surgical Hx:    Past Surgical History  Procedure Laterality Date  . Cyst removal trunk    . Multiple cyst removal surgeries    . Abdominal hysterectomy     Medications: Reviewed & Updated - see associated section                       Current outpatient prescriptions:  .  aspirin EC 81 MG tablet, Take 81 mg by mouth daily., Disp: , Rfl:  .  doxycycline (VIBRAMYCIN) 100 MG capsule, Take 1 capsule (100 mg total) by mouth 2 (two) times daily., Disp: 14 capsule, Rfl: 0 .  hydrALAZINE (APRESOLINE) 25 MG tablet, Take 25 mg by mouth every 8 (eight) hours., Disp: , Rfl:  .  lovastatin (MEVACOR) 40 MG tablet, Take 40 mg by mouth at bedtime., Disp: , Rfl:  .  naproxen (NAPROSYN) 375 MG tablet, Take 1 tablet (375 mg total) by mouth 2 (two) times daily., Disp: 20 tablet, Rfl: 0 .   quinapril (ACCUPRIL) 10 MG tablet, Take 10 mg by mouth daily., Disp: , Rfl:  .  cyclobenzaprine (FLEXERIL) 5 MG tablet, Take 1 tablet (5 mg total) by mouth 3 (three) times daily as needed for muscle spasms. (Patient not taking: Reported on 07/30/2014), Disp: 30 tablet, Rfl: 0 .  metoCLOPramide (REGLAN) 10 MG tablet, Take 1 tablet (10 mg total) by mouth every 6 (six) hours as needed for nausea (abdominal cramping). (Patient not taking: Reported on 01/03/2015), Disp: 10 tablet, Rfl: 0 .  ondansetron (ZOFRAN ODT) 8 MG disintegrating tablet, 8mg  ODT q4 hours prn nausea (Patient not taking: Reported on 01/25/2015), Disp: 6 tablet, Rfl: 0 .  ondansetron (ZOFRAN) 4 MG tablet, Take 1 tablet (4 mg total) by mouth every 8 (eight) hours as needed for nausea or vomiting. (Patient not taking: Reported on 01/03/2015), Disp: 12 tablet, Rfl: 0 .  sulfamethoxazole-trimethoprim (SEPTRA DS) 800-160 MG per tablet, Take 1 tablet by mouth every 12 (twelve) hours. (Patient not taking: Reported on 07/30/2014), Disp: 20 tablet, Rfl: 0   Social History: Reviewed -  reports that she has quit smoking. Her smoking use included Cigarettes. She has never used smokeless tobacco.  Objective Findings:  Vitals: Blood pressure 144/78, pulse 72, weight 161 lb 9.6 oz (73.301 kg).  Physical Examination: General appearance - alert, well appearing, and in no distress, oriented to person, place, and time and normal appearing weight Mental status - alert, oriented to person, place, and time, normal mood, behavior, speech, dress, motor activity, and thought processes, affect appropriate to mood Pelvic -  VULVA: 1 cm healing incision over a 2 cm x 2 cm rubbery nodule, suspect Bartholin's gland tumor   Assessment & Plan:   A:  1. Bartholin's gland tumor, will excise in August after current incisions have healed. Assured pt we will test for malignancy.  2. Review of STD test results, including positive HSV test.    P:  1. Recheck healing after  4 weeks. Discuss surgery options then. Will proceed to excision of bartholins tumor after that, either in July or august. 2. Implications of positive HSV test discussed for about 10 minutes.    This chart was SCRIBED for [name], MD by [name], ED Scribe. This patient was seen in room 1, and the patient's care was started at 11:31 AM.  I personally performed the services described in this documentation, which was SCRIBED in my presence. The recorded information has been reviewed and considered accurate. It has been edited as necessary during review. [name], MD

## 2015-02-05 ENCOUNTER — Ambulatory Visit: Payer: Medicare (Managed Care) | Admitting: Obstetrics and Gynecology

## 2015-02-26 ENCOUNTER — Encounter: Payer: Self-pay | Admitting: Obstetrics and Gynecology

## 2015-02-26 ENCOUNTER — Ambulatory Visit (INDEPENDENT_AMBULATORY_CARE_PROVIDER_SITE_OTHER): Payer: Medicare (Managed Care) | Admitting: Obstetrics and Gynecology

## 2015-02-26 ENCOUNTER — Other Ambulatory Visit: Payer: Self-pay | Admitting: Obstetrics and Gynecology

## 2015-02-26 VITALS — BP 160/86 | Ht 60.0 in | Wt 162.0 lb

## 2015-02-26 DIAGNOSIS — Z01818 Encounter for other preprocedural examination: Secondary | ICD-10-CM

## 2015-02-26 DIAGNOSIS — D280 Benign neoplasm of vulva: Secondary | ICD-10-CM

## 2015-02-26 HISTORY — DX: Benign neoplasm of vulva: D28.0

## 2015-02-26 NOTE — Progress Notes (Signed)
Patient ID: Nicole Horn, female   DOB: 1946/07/25, 69 y.o.   MRN: [id number] Pt here today for pre op for bartholins cyst. Preoperative History and Physical  Nicole Horn is a 69 y.o. No obstetric history on file. here for surgical management of bartholins cyst tumor.   No significant preoperative concerns.  Proposed surgery: Excision of left bartholin cyst tumor  Past Medical History  Diagnosis Date  . Diabetes mellitus without complication   . Hypertension   . High cholesterol    Past Surgical History  Procedure Laterality Date  . Cyst removal trunk    . Multiple cyst removal surgeries    . Abdominal hysterectomy     OB History  No data available  Patient denies any other pertinent gynecologic issues.   Current Outpatient Prescriptions on File Prior to Visit  Medication Sig Dispense Refill  . acyclovir (ZOVIRAX) 400 MG tablet Take 1 tablet (400 mg total) by mouth 2 (two) times daily. Take twice daily for supression 60 tablet prn  . aspirin EC 81 MG tablet Take 81 mg by mouth daily.    Marland Kitchen doxycycline (VIBRAMYCIN) 100 MG capsule Take 1 capsule (100 mg total) by mouth 2 (two) times daily. 14 capsule 0  . hydrALAZINE (APRESOLINE) 25 MG tablet Take 25 mg by mouth every 8 (eight) hours.    . lovastatin (MEVACOR) 40 MG tablet Take 40 mg by mouth at bedtime.    . quinapril (ACCUPRIL) 10 MG tablet Take 10 mg by mouth daily.     No current facility-administered medications on file prior to visit.   No Known Allergies  Social History:   reports that she has quit smoking. Her smoking use included Cigarettes. She has never used smokeless tobacco. She reports that she drinks alcohol. She reports that she does not use illicit drugs.  Family History  Problem Relation Age of Onset  . Cancer Mother     colon  . Cancer Sister     breast  . Cancer Maternal Aunt     stomach    Review of Systems: Noncontributory  PHYSICAL EXAM: Blood pressure 160/86, height 5'  (1.524 m), weight 162 lb (73.483 kg). General appearance - alert, well appearing, and in no distress Chest - clear to auscultation, no wheezes, rales or rhonchi, symmetric air entry Heart - normal rate and regular rhythm Abdomen - soft, nontender, nondistended, no masses or organomegaly Pelvic - examination not indicated Extremities - peripheral pulses normal, no pedal edema, no clubbing or cyanosis  Labs: No results found for this or any previous visit (from the past 336 hour(s)).  Imaging Studies: No results found.  Assessment: There are no active problems to display for this patient.   Plan: Patient will undergo surgical management with excision of left bartholins cyst tumor. .    .mec 02/26/2015 12:49 PM  Preoperative History and Physical  Nicole Horn is a 69 y.o. No obstetric history on file. here for surgical management of bartholinc cyst tumor.   No significant preoperative concerns.  Proposed surgery: excision of left bartholins cyst tumor  Past Medical History  Diagnosis Date  . Diabetes mellitus without complication   . Hypertension   . High cholesterol    Past Surgical History  Procedure Laterality Date  . Cyst removal trunk    . Multiple cyst removal surgeries    . Abdominal hysterectomy     OB History  No data available  Patient denies any other pertinent gynecologic issues.  Current Outpatient Prescriptions on File Prior to Visit  Medication Sig Dispense Refill  . acyclovir (ZOVIRAX) 400 MG tablet Take 1 tablet (400 mg total) by mouth 2 (two) times daily. Take twice daily for supression 60 tablet prn  . aspirin EC 81 MG tablet Take 81 mg by mouth daily.    Marland Kitchen doxycycline (VIBRAMYCIN) 100 MG capsule Take 1 capsule (100 mg total) by mouth 2 (two) times daily. 14 capsule 0  . hydrALAZINE (APRESOLINE) 25 MG tablet Take 25 mg by mouth every 8 (eight) hours.    . lovastatin (MEVACOR) 40 MG tablet Take 40 mg by mouth at bedtime.    . quinapril  (ACCUPRIL) 10 MG tablet Take 10 mg by mouth daily.     No current facility-administered medications on file prior to visit.   No Known Allergies  Social History:   reports that she has quit smoking. Her smoking use included Cigarettes. She has never used smokeless tobacco. She reports that she drinks alcohol. She reports that she does not use illicit drugs.  Family History  Problem Relation Age of Onset  . Cancer Mother     colon  . Cancer Sister     breast  . Cancer Maternal Aunt     stomach    Review of Systems: Noncontributory  PHYSICAL EXAM: Blood pressure 160/86, height 5' (1.524 m), weight 162 lb (73.483 kg). General appearance - alert, well appearing, and in no distress Chest - clear to auscultation, no wheezes, rales or rhonchi, symmetric air entry Heart - normal rate and regular rhythm Abdomen - soft, nontender, nondistended, no masses or organomegaly Pelvic - efg: 2.5 cm smooth rubbery nodule in left bartholins cyst area to left of introitus. Vag normal  Cervix and uterus and tubes and ovaries absent. Extremities - peripheral pulses normal, no pedal edema, no clubbing or cyanosis  Labs: No results found for this or any previous visit (from the past 336 hour(s)).  Imaging Studies: No results found.  Assessment: There are no active problems to display for this patient.   Plan: Patient will undergo surgical management with excision of bartholins cyst tumor..    .mec 02/26/2015 12:49 PM

## 2015-02-26 NOTE — Patient Instructions (Addendum)
Nicole Horn  02/26/2015      Your procedure is scheduled on  03/04/2015   Report to [facility] at  6:15  A.M.  Call this number if you have problems the morning of surgery:  [phone]   Remember:  Do not eat food or drink liquids after midnight.  Take these medicines the morning of surgery with A SIP OF WATER : Hydralazine and Quinapril   Do not wear jewelry, make-up or nail polish.  Do not wear lotions, powders, or perfumes.    Do not shave 48 hours prior to surgery.  Men may shave face and neck.  Do not bring valuables to the hospital.  [facility] is not responsible for any belongings or valuables.  Contacts, dentures or bridgework may not be worn into surgery.  Leave your suitcase in the car.  After surgery it may be brought to your room.  For patients admitted to the hospital, discharge time will be determined by your treatment team.  Patients discharged the day of surgery will not be allowed to drive home.   Special instructions:  Shower with CHG bath (Hibiclens) the night before surgery and the morning of surgery.  Please read over the following fact sheets that you were given. Anesthesia Post-op Instructions      Bartholin's Cyst or Abscess Bartholin's glands are small glands located within the folds of skin (labia) along the sides of the lower opening of the vagina (birth canal). A cyst may develop when the duct of the gland becomes blocked. When this happens, fluid that accumulates within the cyst can become infected. This is known as an abscess. The Bartholin gland produces a mucous fluid to lubricate the outside of the vagina during sexual intercourse. SYMPTOMS   Patients with a small cyst may not have any symptoms.  Mild discomfort to severe pain depending on the size of the cyst and if it is infected (abscess).  Pain, redness, and swelling around the lower opening of the vagina.  Painful intercourse.  Pressure in the perineal  area.  Swelling of the lips of the vagina (labia).  The cyst or abscess can be on one side or both sides of the vagina. DIAGNOSIS   A large swelling is seen in the lower vagina area by your caregiver.  Painful to touch.  Redness and pain, if it is an abscess. TREATMENT   Sometimes the cyst will go away on its own.  Apply warm wet compresses to the area or take hot sitz baths several times a day.  An incision to drain the cyst or abscess with local anesthesia.  Culture the pus, if it is an abscess.  Antibiotic treatment, if it is an abscess.  Cut open the gland and suture the edges to make the opening of the gland bigger (marsupialization).  Remove the whole gland if the cyst or abscess returns. PREVENTION   Practice good hygiene.  Clean the vaginal area with a mild soap and soft cloth when bathing.  Do not rub hard in the vaginal area when bathing.  Protect the crotch area with a padded cushion if you take long bike rides or ride horses.  Be sure you are well lubricated when you have sexual intercourse. HOME CARE INSTRUCTIONS   If your cyst or abscess was opened, a small piece of gauze, or a drain, may have been placed in the wound to allow drainage. Do not remove this gauze or drain unless directed by your caregiver.  Wear feminine pads, not tampons, as needed for any drainage or bleeding.  If antibiotics were prescribed, take them exactly as directed. Finish the entire course.  Only take over-the-counter or prescription medicines for pain, discomfort, or fever as directed by your caregiver. SEEK IMMEDIATE MEDICAL CARE IF:   You have an increase in pain, redness, swelling, or drainage.  You have bleeding from the wound which results in the use of more than the number of pads suggested by your caregiver in 24 hours.  You have chills.  You have a fever.  You develop any new problems (symptoms) or aggravation of your existing condition. MAKE SURE YOU:    Understand these instructions.  Will watch your condition.  Will get help right away if you are not doing well or get worse. Document Released: 08/09/2005 Document Revised: 11/01/2011 Document Reviewed: 03/27/2008 [facility] Patient Information 2015 [place], [place]. This information is not intended to replace advice given to you by your health care provider. Make sure you discuss any questions you have with your health care provider. PATIENT INSTRUCTIONS POST-ANESTHESIA  IMMEDIATELY FOLLOWING SURGERY:  Do not drive or operate machinery for the first twenty four hours after surgery.  Do not make any important decisions for twenty four hours after surgery or while taking narcotic pain medications or sedatives.  If you develop intractable nausea and vomiting or a severe headache please notify your doctor immediately.  FOLLOW-UP:  Please make an appointment with your surgeon as instructed. You do not need to follow up with anesthesia unless specifically instructed to do so.  WOUND CARE INSTRUCTIONS (if applicable):  Keep a dry clean dressing on the anesthesia/puncture wound site if there is drainage.  Once the wound has quit draining you may leave it open to air.  Generally you should leave the bandage intact for twenty four hours unless there is drainage.  If the epidural site drains for more than 36-48 hours please call the anesthesia department.  QUESTIONS?:  Please feel free to call your physician or the hospital operator if you have any questions, and they will be happy to assist you.

## 2015-02-28 ENCOUNTER — Other Ambulatory Visit: Payer: Self-pay

## 2015-02-28 ENCOUNTER — Encounter (HOSPITAL_COMMUNITY)
Admission: RE | Admit: 2015-02-28 | Discharge: 2015-02-28 | Disposition: A | Discharge status: Home / Self Care | Payer: Medicare (Managed Care) | Source: Ambulatory Visit | Attending: Obstetrics and Gynecology | Admitting: Obstetrics and Gynecology

## 2015-02-28 ENCOUNTER — Encounter (HOSPITAL_COMMUNITY): Payer: Self-pay

## 2015-02-28 DIAGNOSIS — Z01818 Encounter for other preprocedural examination: Secondary | ICD-10-CM | POA: Diagnosis not present

## 2015-02-28 HISTORY — DX: Sleep apnea, unspecified: G47.30

## 2015-02-28 LAB — BASIC METABOLIC PANEL
Anion gap: 7 (ref 5–15)
BUN: 16 mg/dL (ref 6–20)
CO2: 26 mmol/L (ref 22–32)
Calcium: 8.9 mg/dL (ref 8.9–10.3)
Chloride: 108 mmol/L (ref 101–111)
Creatinine, Ser: 0.98 mg/dL (ref 0.44–1.00)
GFR calc Af Amer: >60 mL/min (ref >60)
GFR calc non Af Amer: 58 mL/min — ABNORMAL LOW (ref >60)
Glucose, Bld: 102 mg/dL — ABNORMAL HIGH (ref 65–99)
Potassium: 3.9 mmol/L (ref 3.5–5.1)
Sodium: 141 mmol/L (ref 135–145)

## 2015-02-28 LAB — CBC
HCT: 40.4 % (ref 36.0–46.0)
Hemoglobin: 12.9 g/dL (ref 12.0–15.0)
MCH: 26.4 pg (ref 26.0–34.0)
MCHC: 31.9 g/dL (ref 30.0–36.0)
MCV: 82.8 fL (ref 78.0–100.0)
Platelets: 277 10*3/uL (ref 150–400)
RBC: 4.88 MIL/uL (ref 3.87–5.11)
RDW: 13.5 % (ref 11.5–15.5)
WBC: 4 10*3/uL (ref 4.0–10.5)

## 2015-02-28 LAB — URINALYSIS, ROUTINE W REFLEX MICROSCOPIC
Bilirubin Urine: NEGATIVE
Glucose, UA: NEGATIVE mg/dL
Hgb urine dipstick: NEGATIVE
Ketones, ur: NEGATIVE mg/dL
LEUKOCYTES UA: NEGATIVE
Nitrite: NEGATIVE
Protein, ur: NEGATIVE mg/dL
Specific Gravity, Urine: 1.025 (ref 1.005–1.030)
Urobilinogen, UA: 0.2 mg/dL (ref 0.0–1.0)
pH: 5.5 (ref 5.0–8.0)

## 2015-03-03 NOTE — H&P (Signed)
Progress Notes    Expand All Collapse All   Patient ID: Nicole Horn, female DOB: February 15, 1946, 69 y.o. MRN: [id number] Pt here today for pre op for bartholins cyst tumor. Prior I & D revealed the area to be solid rather than cystic or inflamed. Postop check shows no resoluiton of the tumor, a 2 cm firm smooth rubbery nodule in the area of the left  bartholins cyst.. Preoperative History and Physical  Nicole Horn is a 69 y.o. No obstetric history on file. here for surgical management of bartholins cyst tumor. No significant preoperative concerns.  Proposed surgery: Excision of left bartholin cyst tumor  Past Medical History  Diagnosis Date  . Diabetes mellitus without complication   . Hypertension   . High cholesterol    Past Surgical History  Procedure Laterality Date  . Cyst removal trunk    . Multiple cyst removal surgeries    . Abdominal hysterectomy     OB History  No data available  Patient denies any other pertinent gynecologic issues.   Current Outpatient Prescriptions on File Prior to Visit  Medication Sig Dispense Refill  . acyclovir (ZOVIRAX) 400 MG tablet Take 1 tablet (400 mg total) by mouth 2 (two) times daily. Take twice daily for supression 60 tablet prn  . aspirin EC 81 MG tablet Take 81 mg by mouth daily.    Marland Kitchen doxycycline (VIBRAMYCIN) 100 MG capsule Take 1 capsule (100 mg total) by mouth 2 (two) times daily. 14 capsule 0  . hydrALAZINE (APRESOLINE) 25 MG tablet Take 25 mg by mouth every 8 (eight) hours.    . lovastatin (MEVACOR) 40 MG tablet Take 40 mg by mouth at bedtime.    . quinapril (ACCUPRIL) 10 MG tablet Take 10 mg by mouth daily.     No current facility-administered medications on file prior to visit.   No Known Allergies  Social History:  reports that she has quit smoking. Her smoking use included Cigarettes. She has never used smokeless  tobacco. She reports that she drinks alcohol. She reports that she does not use illicit drugs.  Family History  Problem Relation Age of Onset  . Cancer Mother     colon  . Cancer Sister     breast  . Cancer Maternal Aunt     stomach    Review of Systems: Noncontributory  PHYSICAL EXAM: Blood pressure 160/86, height 5' (1.524 m), weight 162 lb (73.483 kg). General appearance - alert, well appearing, and in no distress Chest - clear to auscultation, no wheezes, rales or rhonchi, symmetric air entry Heart - normal rate and regular rhythm Abdomen - soft, nontender, nondistended, no masses or organomegaly Pelvic - examination not indicated Extremities - peripheral pulses normal, no pedal edema, no clubbing or cyanosis  Labs: No results found for this or any previous visit (from the past 336 hour(s)).  Imaging Studies:  Imaging Results    No results found.    Assessment: There are no active problems to display for this patient.   Plan: Patient will undergo surgical management with excision of left bartholins cyst tumor. .    .mec 02/26/2015 12:49 PM  Preoperative History and Physical  Nicole Horn is a 69 y.o. No obstetric history on file. here for surgical management of bartholinc cyst tumor. No significant preoperative concerns.  Proposed surgery: excision of left bartholins cyst tumor  Past Medical History  Diagnosis Date  . Diabetes mellitus without complication   . Hypertension   .  High cholesterol    Past Surgical History  Procedure Laterality Date  . Cyst removal trunk    . Multiple cyst removal surgeries    . Abdominal hysterectomy     OB History  No data available  Patient denies any other pertinent gynecologic issues.   Current Outpatient Prescriptions on File Prior to Visit  Medication Sig Dispense Refill  . acyclovir (ZOVIRAX) 400 MG tablet Take 1 tablet (400 mg  total) by mouth 2 (two) times daily. Take twice daily for supression 60 tablet prn  . aspirin EC 81 MG tablet Take 81 mg by mouth daily.    Marland Kitchen doxycycline (VIBRAMYCIN) 100 MG capsule Take 1 capsule (100 mg total) by mouth 2 (two) times daily. 14 capsule 0  . hydrALAZINE (APRESOLINE) 25 MG tablet Take 25 mg by mouth every 8 (eight) hours.    . lovastatin (MEVACOR) 40 MG tablet Take 40 mg by mouth at bedtime.    . quinapril (ACCUPRIL) 10 MG tablet Take 10 mg by mouth daily.     No current facility-administered medications on file prior to visit.   No Known Allergies  Social History:  reports that she has quit smoking. Her smoking use included Cigarettes. She has never used smokeless tobacco. She reports that she drinks alcohol. She reports that she does not use illicit drugs.  Family History  Problem Relation Age of Onset  . Cancer Mother     colon  . Cancer Sister     breast  . Cancer Maternal Aunt     stomach    Review of Systems: Noncontributory  PHYSICAL EXAM: Blood pressure 160/86, height 5' (1.524 m), weight 162 lb (73.483 kg). General appearance - alert, well appearing, and in no distress Chest - clear to auscultation, no wheezes, rales or rhonchi, symmetric air entry Heart - normal rate and regular rhythm Abdomen - soft, nontender, nondistended, no masses or organomegaly Pelvic - efg: 2.5 cm smooth rubbery nodule in left bartholins cyst area to left of introitus. Vag normal  Cervix and uterus and tubes and ovaries absent. Extremities - peripheral pulses normal, no pedal edema, no clubbing or cyanosis  Labs: No results found for this or any previous visit (from the past 336 hour(s)).  Imaging Studies:  Imaging Results    No results found.    Assessment: There are no active problems to display for this patient.   Plan: Patient will undergo surgical management with excision of bartholins cyst tumor.Marland Kitchen

## 2015-03-04 ENCOUNTER — Ambulatory Visit (HOSPITAL_COMMUNITY): Payer: Medicare (Managed Care) | Admitting: Anesthesiology

## 2015-03-04 ENCOUNTER — Encounter (HOSPITAL_COMMUNITY)
Admission: RE | Disposition: A | Discharge status: Home / Self Care | Payer: Self-pay | Source: Ambulatory Visit | Attending: Obstetrics and Gynecology

## 2015-03-04 ENCOUNTER — Ambulatory Visit (HOSPITAL_COMMUNITY)
Admission: RE | Admit: 2015-03-04 | Discharge: 2015-03-04 | Disposition: A | Discharge status: Home / Self Care | Payer: Medicare (Managed Care) | Source: Ambulatory Visit | Attending: Obstetrics and Gynecology | Admitting: Obstetrics and Gynecology

## 2015-03-04 ENCOUNTER — Encounter (HOSPITAL_COMMUNITY): Payer: Self-pay | Admitting: *Deleted

## 2015-03-04 DIAGNOSIS — Z79899 Other long term (current) drug therapy: Secondary | ICD-10-CM | POA: Diagnosis not present

## 2015-03-04 DIAGNOSIS — E780 Pure hypercholesterolemia: Secondary | ICD-10-CM | POA: Insufficient documentation

## 2015-03-04 DIAGNOSIS — D280 Benign neoplasm of vulva: Secondary | ICD-10-CM | POA: Diagnosis not present

## 2015-03-04 DIAGNOSIS — Z7982 Long term (current) use of aspirin: Secondary | ICD-10-CM | POA: Insufficient documentation

## 2015-03-04 DIAGNOSIS — G4730 Sleep apnea, unspecified: Secondary | ICD-10-CM | POA: Diagnosis not present

## 2015-03-04 DIAGNOSIS — I10 Essential (primary) hypertension: Secondary | ICD-10-CM | POA: Insufficient documentation

## 2015-03-04 DIAGNOSIS — E119 Type 2 diabetes mellitus without complications: Secondary | ICD-10-CM | POA: Insufficient documentation

## 2015-03-04 DIAGNOSIS — N750 Cyst of Bartholin's gland: Secondary | ICD-10-CM | POA: Diagnosis not present

## 2015-03-04 DIAGNOSIS — Z87891 Personal history of nicotine dependence: Secondary | ICD-10-CM | POA: Diagnosis not present

## 2015-03-04 DIAGNOSIS — D495 Neoplasm of unspecified behavior of other genitourinary organs: Secondary | ICD-10-CM | POA: Diagnosis present

## 2015-03-04 HISTORY — PX: BARTHOLIN GLAND CYST EXCISION: SHX565

## 2015-03-04 LAB — GLUCOSE, CAPILLARY: Glucose-Capillary: 102 mg/dL — ABNORMAL HIGH (ref 65–99)

## 2015-03-04 SURGERY — EXCISION, BARTHOLIN'S GLAND
Anesthesia: General | Site: Vagina | Laterality: Left

## 2015-03-04 MED ORDER — ONDANSETRON HCL 4 MG/2ML IJ SOLN
4.0000 mg | Freq: Once | INTRAMUSCULAR | Status: AC
  Administered 2015-03-04: 4 mg via INTRAVENOUS

## 2015-03-04 MED ORDER — BUPIVACAINE-EPINEPHRINE (PF) 0.5% -1:200000 IJ SOLN
INTRAMUSCULAR | Status: AC
  Filled 2015-03-04: qty 30

## 2015-03-04 MED ORDER — 0.9 % SODIUM CHLORIDE (POUR BTL) OPTIME
TOPICAL | Status: DC | PRN
  Administered 2015-03-04: 1000 mL

## 2015-03-04 MED ORDER — FENTANYL CITRATE (PF) 100 MCG/2ML IJ SOLN
25.0000 ug | INTRAMUSCULAR | Status: AC
  Administered 2015-03-04 (×2): 25 ug via INTRAVENOUS

## 2015-03-04 MED ORDER — PHENYLEPHRINE 40 MCG/ML (10ML) SYRINGE FOR IV PUSH (FOR BLOOD PRESSURE SUPPORT)
PREFILLED_SYRINGE | INTRAVENOUS | Status: AC
  Filled 2015-03-04: qty 10

## 2015-03-04 MED ORDER — CEFAZOLIN SODIUM-DEXTROSE 2-3 GM-% IV SOLR
2.0000 g | Freq: Once | INTRAVENOUS | Status: DC
  Filled 2015-03-04: qty 50

## 2015-03-04 MED ORDER — PROPOFOL 10 MG/ML IV BOLUS
INTRAVENOUS | Status: DC | PRN
  Administered 2015-03-04: 100 mg via INTRAVENOUS

## 2015-03-04 MED ORDER — MIDAZOLAM HCL 2 MG/2ML IJ SOLN
1.0000 mg | INTRAMUSCULAR | Status: DC | PRN
  Administered 2015-03-04: 2 mg via INTRAVENOUS

## 2015-03-04 MED ORDER — FENTANYL CITRATE (PF) 250 MCG/5ML IJ SOLN
INTRAMUSCULAR | Status: AC
  Filled 2015-03-04: qty 5

## 2015-03-04 MED ORDER — LIDOCAINE HCL (CARDIAC) 20 MG/ML IV SOLN
INTRAVENOUS | Status: DC | PRN
  Administered 2015-03-04: 50 mg via INTRAVENOUS

## 2015-03-04 MED ORDER — ONDANSETRON HCL 4 MG/2ML IJ SOLN: 4.0000 mg | Freq: Once | INTRAMUSCULAR | Status: DC | PRN

## 2015-03-04 MED ORDER — FENTANYL CITRATE (PF) 100 MCG/2ML IJ SOLN: 25.0000 ug | INTRAMUSCULAR | Status: DC | PRN

## 2015-03-04 MED ORDER — MIDAZOLAM HCL 2 MG/2ML IJ SOLN
INTRAMUSCULAR | Status: AC
  Filled 2015-03-04: qty 2

## 2015-03-04 MED ORDER — EPHEDRINE SULFATE 50 MG/ML IJ SOLN
INTRAMUSCULAR | Status: DC | PRN
  Administered 2015-03-04: 5 mg via INTRAVENOUS

## 2015-03-04 MED ORDER — CEFAZOLIN SODIUM-DEXTROSE 2-3 GM-% IV SOLR
INTRAVENOUS | Status: AC
  Filled 2015-03-04: qty 50

## 2015-03-04 MED ORDER — PROPOFOL 10 MG/ML IV BOLUS
INTRAVENOUS | Status: AC
  Filled 2015-03-04: qty 20

## 2015-03-04 MED ORDER — ONDANSETRON HCL 4 MG/2ML IJ SOLN
INTRAMUSCULAR | Status: AC
  Filled 2015-03-04: qty 2

## 2015-03-04 MED ORDER — FENTANYL CITRATE (PF) 100 MCG/2ML IJ SOLN
INTRAMUSCULAR | Status: AC
  Filled 2015-03-04: qty 2

## 2015-03-04 MED ORDER — OXYCODONE-ACETAMINOPHEN 5-325 MG PO TABS: 1.0000 | ORAL_TABLET | ORAL | Status: DC | PRN

## 2015-03-04 MED ORDER — FENTANYL CITRATE (PF) 100 MCG/2ML IJ SOLN
INTRAMUSCULAR | Status: DC | PRN
  Administered 2015-03-04 (×2): 25 ug via INTRAVENOUS

## 2015-03-04 MED ORDER — CEFAZOLIN SODIUM-DEXTROSE 2-3 GM-% IV SOLR
INTRAVENOUS | Status: DC | PRN
  Administered 2015-03-04: 2 g via INTRAVENOUS

## 2015-03-04 MED ORDER — BUPIVACAINE-EPINEPHRINE 0.5% -1:200000 IJ SOLN
INTRAMUSCULAR | Status: DC | PRN
  Administered 2015-03-04: 10 mL

## 2015-03-04 MED ORDER — LACTATED RINGERS IV SOLN
INTRAVENOUS | Status: DC
  Administered 2015-03-04: 1000 mL via INTRAVENOUS
  Administered 2015-03-04: 09:00:00 via INTRAVENOUS

## 2015-03-04 SURGICAL SUPPLY — 30 items
BAG HAMPER (MISCELLANEOUS) ×3 IMPLANT
CATH ROBINSON RED A/P 16FR (CATHETERS) ×3 IMPLANT
CLOTH BEACON ORANGE TIMEOUT ST (SAFETY) ×3 IMPLANT
COVER LIGHT HANDLE STERIS (MISCELLANEOUS) ×6 IMPLANT
DECANTER SPIKE VIAL GLASS SM (MISCELLANEOUS) ×3 IMPLANT
ELECT REM PT RETURN 9FT ADLT (ELECTROSURGICAL) ×3
ELECTRODE REM PT RTRN 9FT ADLT (ELECTROSURGICAL) ×1 IMPLANT
FORMALIN 10 PREFIL 120ML (MISCELLANEOUS) ×3 IMPLANT
GLOVE BIOGEL PI IND STRL 7.0 (GLOVE) ×1 IMPLANT
GLOVE BIOGEL PI IND STRL 8 (GLOVE) ×1 IMPLANT
GLOVE BIOGEL PI IND STRL 9 (GLOVE) ×1 IMPLANT
GLOVE BIOGEL PI INDICATOR 7.0 (GLOVE) ×2
GLOVE BIOGEL PI INDICATOR 8 (GLOVE) ×2
GLOVE BIOGEL PI INDICATOR 9 (GLOVE) ×2
GLOVE ECLIPSE 6.5 STRL STRAW (GLOVE) ×3 IMPLANT
GLOVE ECLIPSE 9.0 STRL (GLOVE) ×3 IMPLANT
GOWN SPEC L3 XXLG W/TWL (GOWN DISPOSABLE) ×6 IMPLANT
GOWN STRL REUS W/TWL LRG LVL3 (GOWN DISPOSABLE) ×3 IMPLANT
KIT ROOM TURNOVER AP CYSTO (KITS) ×3 IMPLANT
MANIFOLD NEPTUNE II (INSTRUMENTS) ×3 IMPLANT
NEEDLE HYPO 25X1 1.5 SAFETY (NEEDLE) ×3 IMPLANT
NS IRRIG 1000ML POUR BTL (IV SOLUTION) ×3 IMPLANT
PACK PERI GYN (CUSTOM PROCEDURE TRAY) ×3 IMPLANT
PAD ARMBOARD 7.5X6 YLW CONV (MISCELLANEOUS) ×3 IMPLANT
SET BASIN LINEN APH (SET/KITS/TRAYS/PACK) ×3 IMPLANT
SUT CHROMIC 2 0 SH (SUTURE) IMPLANT
SUT VIC AB 3-0 SH 27 (SUTURE) ×2
SUT VIC AB 3-0 SH 27X BRD (SUTURE) ×1 IMPLANT
SYR CONTROL 10ML LL (SYRINGE) ×3 IMPLANT
TRAY FOLEY CATH SILVER 16FR (SET/KITS/TRAYS/PACK) IMPLANT

## 2015-03-04 NOTE — Op Note (Signed)
Please see the operative details included in the brief operative note

## 2015-03-04 NOTE — Anesthesia Procedure Notes (Signed)
Procedure Name: LMA Insertion Date/Time: 03/04/2015 7:44 AM Performed by: Andree Elk, Earlie Arciga A Pre-anesthesia Checklist: Timeout performed, Patient identified, Emergency Drugs available, Suction available and Patient being monitored Patient Re-evaluated:Patient Re-evaluated prior to inductionOxygen Delivery Method: Circle system utilized Intubation Type: IV induction Ventilation: Mask ventilation without difficulty LMA: LMA inserted LMA Size: 4.0 Number of attempts: 1 Placement Confirmation: positive ETCO2 and breath sounds checked- equal and bilateral Tube secured with: Tape Dental Injury: Teeth and Oropharynx as per pre-operative assessment

## 2015-03-04 NOTE — Interval H&P Note (Signed)
History and Physical Interval Note:  03/04/2015 7:33 AM  Nicole Horn  has presented today for surgery, with the diagnosis of Solid mass in labia majora in area of bartholins gland  The various methods of treatment have been discussed with the patient and family. After consideration of risks, benefits and other options for treatment, the patient has consented to  Procedure(s): EXCISION OF LEFT BARTHOLINS TUMOR (Left) as a surgical intervention .  The patient's history has been reviewed, patient examined, no change in status, stable for surgery.  I have reviewed the patient's chart and labs.  Questions were answered to the patient's satisfaction.     [name]

## 2015-03-04 NOTE — Brief Op Note (Signed)
03/04/2015  8:31 AM  PATIENT:  Nicole Horn  69 y.o. female  PRE-OPERATIVE DIAGNOSIS:  Solid mass in labia majora in area of bartholins gland left Bartholin gland tumor  POST-OPERATIVE DIAGNOSIS:  Solid mass in labia majora in area of bartholins gland left Bartholin's gland tumor  PROCEDURE:  Procedure(s): EXCISION OF LEFT BARTHOLINS TUMOR (Left)  SURGEON:  Surgeon(s) and Role:    * [name], MD - Primary  PHYSICIAN ASSISTANT:   ASSISTANTS: Cleda Clarks CST   ANESTHESIA:   local and general  EBL:  Total I/O In: 800 [I.V.:800] Out: 46 [Blood:50]  BLOOD ADMINISTERED:none  DRAINS: none   LOCAL MEDICATIONS USED:  MARCAINE     SPECIMEN:  Source of Specimen:  Left Bartholin's gland area  DISPOSITION OF SPECIMEN:  PATHOLOGY  COUNTS:  YES  TOURNIQUET:  * No tourniquets in log *  DICTATION: .Dragon Dictation  PLAN OF CARE: Discharge to home after PACU  PATIENT DISPOSITION:  PACU - hemodynamically stable.   Delay start of Pharmacological VTE agent (>24hrs) due to surgical blood loss or risk of bleeding: not applicable Details of procedure: Patient was taken the operating room prepped and draped for vaginal procedure. Timeout was conducted. Ancef was administered. An elliptical incision was made over the 1.5 cm mass located just outside the hymen remnant on the left side of the vagina at 4:00 with it extending upward to about 2:00. A smooth firm S was encountered which seem to have any encapsulation. The surrounding tissue could be peeled sharply off of the tumor. Blood supply seemed to come in from the superior and lateral aspects of the tumor and were crossclamped and transected involving blood vessels and some muscle fibers. These were tied off. The rest of the tumor was peeled out and point cautery used as necessary to achieve hemostasis. A 2 layer closure was performed, with running 3-0 Vicryl second deep layer and then the superficial layer of interrupted  3-0 Vicryl sutures hemostasis was satisfactory and patient Marcaine completed the procedure patient to recovery in stable condition

## 2015-03-04 NOTE — Interval H&P Note (Signed)
History and Physical Interval Note:  03/04/2015 7:32 AM  Nicole Horn  has presented today for surgery, with the diagnosis of Solid mass in labia majora in area of bartholins gland  The various methods of treatment have been discussed with the patient and family. After consideration of risks, benefits and other options for treatment, the patient has consented to  Procedure(s): EXCISION OF LEFT BARTHOLINS TUMOR (Left) as a surgical intervention .  The patient's history has been reviewed, patient examined, no change in status, stable for surgery.  I have reviewed the patient's chart and labs.  Questions were answered to the patient's satisfaction.     [name]

## 2015-03-04 NOTE — Anesthesia Postprocedure Evaluation (Signed)
  Anesthesia Post-op Note Late entry  Patient: Nicole Horn  Procedure(s) Performed: Procedure(s): EXCISION OF LEFT BARTHOLINS TUMOR (Left)  Patient Location: PACU  Anesthesia Type:General  Level of Consciousness: awake, alert , oriented and patient cooperative  Airway and Oxygen Therapy: Patient Spontanous Breathing and Patient connected to face mask oxygen  Post-op Pain: mild  Post-op Assessment: Post-op Vital signs reviewed, Patient's Cardiovascular Status Stable, Respiratory Function Stable, Patent Airway, No signs of Nausea or vomiting and Pain level controlled              Post-op Vital Signs: Reviewed and stable  Last Vitals:  Filed Vitals:   03/04/15 0915  BP: 170/63  Pulse: 64  Temp:   Resp: 13    Complications: No apparent anesthesia complications

## 2015-03-04 NOTE — Transfer of Care (Signed)
Immediate Anesthesia Transfer of Care Note  Patient: Nicole Horn  Procedure(s) Performed: Procedure(s): EXCISION OF LEFT BARTHOLINS TUMOR (Left)  Patient Location: PACU  Anesthesia Type:General  Level of Consciousness: awake, oriented and patient cooperative  Airway & Oxygen Therapy: Patient Spontanous Breathing and Patient connected to face mask oxygen  Post-op Assessment: Report given to RN and Post -op Vital signs reviewed and stable  Post vital signs: Reviewed and stable  Last Vitals:  Filed Vitals:   03/04/15 0730  BP: 141/66  Temp:   Resp: 13    Complications: No apparent anesthesia complications

## 2015-03-04 NOTE — Anesthesia Preprocedure Evaluation (Signed)
Anesthesia Evaluation  Patient identified by MRN, date of birth, ID band Patient awake    Reviewed: Allergy & Precautions, NPO status , Patient's Chart, lab work & pertinent test results  Airway Mallampati: III  TM Distance: >3 FB     Dental  (+) Teeth Intact, Partial Lower   Pulmonary sleep apnea , former smoker,  breath sounds clear to auscultation        Cardiovascular hypertension, Pt. on medications Rhythm:Regular Rate:Normal     Neuro/Psych    GI/Hepatic negative GI ROS,   Endo/Other  diabetes, Type 2  Renal/GU      Musculoskeletal   Abdominal   Peds  Hematology   Anesthesia Other Findings   Reproductive/Obstetrics                             Anesthesia Physical Anesthesia Plan  ASA: III  Anesthesia Plan: General   Post-op Pain Management:    Induction: Intravenous  Airway Management Planned: LMA  Additional Equipment:   Intra-op Plan:   Post-operative Plan: Extubation in OR  Informed Consent: I have reviewed the patients History and Physical, chart, labs and discussed the procedure including the risks, benefits and alternatives for the proposed anesthesia with the patient or authorized representative who has indicated his/her understanding and acceptance.     Plan Discussed with:   Anesthesia Plan Comments:         Anesthesia Quick Evaluation

## 2015-03-05 ENCOUNTER — Encounter (HOSPITAL_COMMUNITY): Payer: Self-pay | Admitting: Obstetrics and Gynecology

## 2015-03-14 ENCOUNTER — Encounter: Payer: Self-pay | Admitting: Obstetrics and Gynecology

## 2015-03-14 ENCOUNTER — Ambulatory Visit (INDEPENDENT_AMBULATORY_CARE_PROVIDER_SITE_OTHER): Payer: Medicare Other | Admitting: Obstetrics and Gynecology

## 2015-03-14 VITALS — BP 200/96 | Ht 60.0 in | Wt 158.0 lb

## 2015-03-14 DIAGNOSIS — Z09 Encounter for follow-up examination after completed treatment for conditions other than malignant neoplasm: Secondary | ICD-10-CM

## 2015-03-14 NOTE — Progress Notes (Signed)
Patient ID: Nicole Horn, female   DOB: 08-Apr-1946, 69 y.o.   MRN: [id number] Pt here today for post op visit. Pt denies any problems or concerns at this time.

## 2015-03-14 NOTE — Progress Notes (Signed)
Patient ID: Nicole Horn, female   DOB: 01-09-1946, 69 y.o.   MRN: [id number]  Subjective:  Nicole Horn is a 69 y.o. female now 10 days status post excision bartholins cyst tumor, leiomyoma.  .  Review of Systems Negative except as noted above   Diet:   normal   Bowel movements : normal.  Pain is controlled without any medications.  Objective:  BP 200/96 mmHg  Ht 5' (1.524 m)  Wt 158 lb (71.668 kg)  BMI 30.86 kg/m2 General:Well developed, well nourished.  No acute distress. Abdomen: Bowel sounds normal, soft, non-tender. Pelvic Exam:    External Genitalia:   Incision(s):   Healing well, no drainage, no erythema, no hernia, some swelling, no dehiscence,  Assessment:  Post-Op 10 days s/p excision bartholins cyst tumor, leiomyoma. Pt informed of benign nature of the tumor removed.  Doing well postoperatively.   Plan:  1.Wound care discussed   2. . current medications. n/a 3. Activity restrictions: caution with sexual activity 4. return to work: not applicable. 5. Follow up in 2 weeks.   This chart was SCRIBED for [name], MD by [name], ED Scribe. This patient was seen in room 1 and the patient's care was started at 11:51 AM.  I personally performed the services described in this documentation, which was SCRIBED in my presence. The recorded information has been reviewed and considered accurate. It has been edited as necessary during review. [name], MD

## 2015-03-21 ENCOUNTER — Encounter: Payer: Medicare (Managed Care) | Admitting: Obstetrics and Gynecology

## 2015-04-11 ENCOUNTER — Ambulatory Visit: Payer: Medicare Other | Admitting: Obstetrics and Gynecology

## 2015-04-14 ENCOUNTER — Encounter: Payer: Self-pay | Admitting: Obstetrics and Gynecology

## 2015-04-14 ENCOUNTER — Ambulatory Visit (INDEPENDENT_AMBULATORY_CARE_PROVIDER_SITE_OTHER): Payer: Medicare (Managed Care) | Admitting: Obstetrics and Gynecology

## 2015-04-14 VITALS — BP 155/88 | Ht 60.0 in | Wt 159.0 lb

## 2015-04-14 DIAGNOSIS — D280 Benign neoplasm of vulva: Secondary | ICD-10-CM | POA: Diagnosis not present

## 2015-04-14 NOTE — Progress Notes (Signed)
Patient ID: Nicole Horn, female   DOB: 1946/06/05, 69 y.o.   MRN: [id number] Pt here today for follow up. Pt states that she has a cramp every now and then in the area of her surgery. Pt states that the pain comes and goes but is pretty frequent.

## 2015-04-14 NOTE — Progress Notes (Signed)
Patient ID: Nicole Horn, female   DOB: April 27, 1946, 68 y.o.   MRN: [id number]   Subjective:  Nicole Horn is a 69 y.o. female now 7 weeks status post excision bartholins cyst tumor, leiomyoma.  .  Patient notes her soreness from the tumor removed has improved significantly. However, she still reports intermittent cramps in the area of her surgery.   Review of Systems Negative except as noted above   Diet:   normal   Bowel movements : normal.  Pain is controlled without any medications.  Objective:  BP 155/88 mmHg  Ht 5' (1.524 m)  Wt 159 lb (72.122 kg)  BMI 31.05 kg/m2 General:Well developed, well nourished.  No acute distress. Abdomen: Bowel sounds normal, soft, non-tender. Pelvic Exam:    External Genitalia:  Normal. Incision(s):   Healing well, no drainage, no erythema, no hernia, no swelling, no dehiscence,     Assessment:  Post-Op 7 weeks s/p excision bartholins cyst tumor, leiomyoma.  .    Doing well postoperatively.   Plan:  1.Wound care discussed   2. . current medications. n/a 3. Activity restrictions: none 4. return to work: not applicable. 5. Follow up prn.    This chart was SCRIBED for Nicole Shirk, MD by [name], ED Scribe. This patient was seen in room 2, and the patient's care was started at 12:11 PM.  I personally performed the services described in this documentation, which was SCRIBED in my presence. The recorded information has been reviewed and considered accurate. It has been edited as necessary during review. [name], MD

## 2015-06-09 DIAGNOSIS — E1122 Type 2 diabetes mellitus with diabetic chronic kidney disease: Secondary | ICD-10-CM | POA: Insufficient documentation

## 2016-02-05 ENCOUNTER — Other Ambulatory Visit: Payer: Self-pay | Admitting: Obstetrics and Gynecology

## 2016-06-04 ENCOUNTER — Other Ambulatory Visit: Payer: Self-pay | Admitting: Obstetrics and Gynecology

## 2017-02-21 DIAGNOSIS — I739 Peripheral vascular disease, unspecified: Secondary | ICD-10-CM | POA: Insufficient documentation

## 2017-05-13 DIAGNOSIS — I739 Peripheral vascular disease, unspecified: Secondary | ICD-10-CM | POA: Insufficient documentation

## 2017-08-23 DIAGNOSIS — I739 Peripheral vascular disease, unspecified: Secondary | ICD-10-CM

## 2017-08-23 HISTORY — DX: Peripheral vascular disease, unspecified: I73.9

## 2019-01-08 ENCOUNTER — Telehealth: Payer: Self-pay | Admitting: Radiology

## 2019-01-08 NOTE — Telephone Encounter (Signed)
Nicole Horn is aware of this and is handling the records, cd and/or appt, sorry for the mixup!!  Thanks!

## 2019-01-08 NOTE — Telephone Encounter (Signed)
You placed a CD on my desk for this patient, but I am not sure what I need to do with it, can you clarify? Something about scheduling appointment, but not sure what this is xrays lumbar cervical and MRI head   Can I have more information? Who wanted to review, and what is the appointment for?

## 2019-01-08 NOTE — Telephone Encounter (Signed)
Great, thank you!

## 2019-01-12 ENCOUNTER — Other Ambulatory Visit: Payer: Self-pay

## 2019-01-12 ENCOUNTER — Ambulatory Visit (INDEPENDENT_AMBULATORY_CARE_PROVIDER_SITE_OTHER): Payer: Medicare (Managed Care)

## 2019-01-12 ENCOUNTER — Encounter: Payer: Self-pay | Admitting: Orthopedic Surgery

## 2019-01-12 ENCOUNTER — Ambulatory Visit (INDEPENDENT_AMBULATORY_CARE_PROVIDER_SITE_OTHER): Payer: Medicare (Managed Care) | Admitting: Orthopedic Surgery

## 2019-01-12 VITALS — BP 142/62 | HR 73 | Temp 97.3°F | Ht 60.0 in | Wt 168.0 lb

## 2019-01-12 DIAGNOSIS — M545 Low back pain, unspecified: Secondary | ICD-10-CM

## 2019-01-12 DIAGNOSIS — M25561 Pain in right knee: Secondary | ICD-10-CM

## 2019-01-12 DIAGNOSIS — G8929 Other chronic pain: Secondary | ICD-10-CM | POA: Diagnosis not present

## 2019-01-12 DIAGNOSIS — S73191A Other sprain of right hip, initial encounter: Secondary | ICD-10-CM

## 2019-01-12 NOTE — Progress Notes (Signed)
NEW PROBLEM OFFICE VISIT  Chief Complaint  Patient presents with  . Knee Pain    Right knee over a year had a fall in '17. Thinks pain is coming from back.    73 year old female presented to [place] initially requesting an injection but when she was here she indicated that she was having multiple orthopedic problems  Problem #1 pain right hip status post fall had MRI which showed labral tear and decreased femoral offset  Problem #2 pain right knee for greater than 2 years diffuse pain without localization, denies decreased range of motion or swelling.  Problem #3 pain right thumb  The patient says she is having a lot of pain that interfering with her activities of daily living.  She says her hip really bothers her and she would like it addressed surgically if necessary but prefers not to do total knee if possible  She was scheduled for some injections not sure exactly where they were going to give them sounds like it was going to be hip joint  She has MRIs of her cervical and lumbar spine as well as plain films and an MRI report indicating the labral tear   Review of Systems  Musculoskeletal: Positive for joint pain.  Neurological: Positive for weakness. Negative for tingling, sensory change and focal weakness.       Denies giving way of the right knee but reports a weakness in the right leg     Past Medical History:  Diagnosis Date  . Diabetes mellitus without complication (HCC)    no medications, uses diet and exercise to control  . High cholesterol   . Hypertension   . Sleep apnea    Pt supposed to wear CPAP, but doesn't.    Past Surgical History:  Procedure Laterality Date  . ABDOMINAL HYSTERECTOMY    . BACK SURGERY    . BARTHOLIN GLAND CYST EXCISION Left 03/04/2015   Procedure: EXCISION OF LEFT BARTHOLINS TUMOR;  Surgeon: [name], MD;  Location: [facility];  Service: Gynecology;  Laterality: Left;  . CERVICAL SPINE SURGERY    . CYST REMOVAL TRUNK    . multiple  cyst removal surgeries      Family History  Problem Relation Age of Onset  . Cancer Mother        colon  . Cancer Sister        breast  . Cancer Maternal Aunt        stomach   Social History   Tobacco Use  . Smoking status: Former Smoker    Packs/day: 2.00    Years: 25.00    Pack years: 50.00    Types: Cigarettes  . Smokeless tobacco: Never Used  Substance Use Topics  . Alcohol use: Yes    Alcohol/week: 0.0 standard drinks    Comment: socially  . Drug use: No    No Known Allergies  No outpatient medications have been marked as taking for the 01/12/19 encounter (Office Visit) with Carole Civil, MD.    BP (!) 142/62   Pulse 73   Temp (!) 97.3 F (36.3 C)   Ht 5' (1.524 m)   Wt 168 lb (76.2 kg)   BMI 32.81 kg/m   Physical Exam General appearance well-developed well-nourished well-groomed Oriented x3 Mood pleasant affect normal Gait and station showed no gross abnormalities  Ortho Exam  Right hip tenderness is noted in the hip flexor she actually has very good range of motion with mild pain on internal rotation  and hip flexion hip feels stable muscle tone and strength are normal her skin looks good she has a good pulse normal temperature no peripheral edema no lymph nodes in the groin her sensory exam remains normal  Left hip full range of motion no tenderness normal strength skin looks good pulses are good there is no peripheral edema sensation is normal    MEDICAL DECISION SECTION  Xrays were done at [facility]  My independent reading of xrays:  Mild arthritis normal alignment see report  Her MRI report reveals she has a labral tear and has decreased femoral head neck offset indicating femoral acetabular impingement   Encounter Diagnoses  Name Primary?  . Chronic pain of right knee Yes  . Tear of right acetabular labrum, initial encounter   . Lumbar pain     PLAN: (Rx., injectx, surgery, frx, mri/ct) I will make a referral to Dr. Aretha Parrot to  evaluate the hip for possible arthroscopy  I injected her right knee  I set her up for an appointment to have her right thumb x-rayed   Procedure note right knee injection   verbal consent was obtained to inject right knee joint  Timeout was completed to confirm the site of injection  The medications used were 40 mg of Depo-Medrol and 1% lidocaine 3 cc  Anesthesia was provided by ethyl chloride and the skin was prepped with alcohol.  After cleaning the skin with alcohol a 20-gauge needle was used to inject the right knee joint. There were no complications. A sterile bandage was applied.  No orders of the defined types were placed in this encounter.   Arther Abbott, MD  01/12/2019 11:56 AM

## 2019-01-23 ENCOUNTER — Telehealth: Payer: Self-pay | Admitting: Radiology

## 2019-01-23 NOTE — Telephone Encounter (Signed)
Left message for patient to see if she has been called about appointment scheduling yet with Dr Aretha Parrot. I sent the referral, they told me they would call her and call me

## 2019-01-30 ENCOUNTER — Other Ambulatory Visit: Payer: Medicare (Managed Care)

## 2019-01-30 ENCOUNTER — Other Ambulatory Visit: Payer: Self-pay

## 2019-01-30 DIAGNOSIS — Z20822 Contact with and (suspected) exposure to covid-19: Secondary | ICD-10-CM

## 2019-02-12 ENCOUNTER — Encounter: Payer: Self-pay | Admitting: Orthopedic Surgery

## 2019-02-12 ENCOUNTER — Other Ambulatory Visit: Payer: Self-pay

## 2019-02-12 ENCOUNTER — Ambulatory Visit (INDEPENDENT_AMBULATORY_CARE_PROVIDER_SITE_OTHER): Payer: Medicare (Managed Care) | Admitting: Orthopedic Surgery

## 2019-02-12 ENCOUNTER — Ambulatory Visit (INDEPENDENT_AMBULATORY_CARE_PROVIDER_SITE_OTHER): Payer: Medicare (Managed Care)

## 2019-02-12 VITALS — BP 142/76 | HR 69 | Temp 97.9°F | Ht 60.0 in | Wt 165.0 lb

## 2019-02-12 DIAGNOSIS — M19049 Primary osteoarthritis, unspecified hand: Secondary | ICD-10-CM | POA: Diagnosis not present

## 2019-02-12 DIAGNOSIS — M79644 Pain in right finger(s): Secondary | ICD-10-CM

## 2019-02-12 NOTE — Progress Notes (Signed)
Chief Complaint  Patient presents with  . Hand Pain    Rt thumb   . Hip Pain    Rt hip    73 year old female here today for pain in the right thumb dull ache intermittent pain is located the basal joint of the thumb she associates with a fall that she had in the past complains of painful range of motion painful pinch weak grip   Past Medical History:  Diagnosis Date  . Diabetes mellitus without complication (HCC)    no medications, uses diet and exercise to control  . High cholesterol   . Hypertension   . Sleep apnea    Pt supposed to wear CPAP, but doesn't.   Review of Systems  Musculoskeletal: Positive for joint pain.  Neurological: Negative for sensory change.     Current Outpatient Medications:  .  acyclovir (ZOVIRAX) 400 MG tablet, TAKE 1 TABLET BY MOUTH TWICE A DAY, Disp: 60 tablet, Rfl: 8 .  acyclovir (ZOVIRAX) 400 MG tablet, TAKE 1 TABLET BY MOUTH TWICE A DAY, Disp: 60 tablet, Rfl: 8 .  aspirin EC 81 MG tablet, Take 81 mg by mouth daily., Disp: , Rfl:  .  hydrALAZINE (APRESOLINE) 25 MG tablet, Take 25 mg by mouth every 8 (eight) hours., Disp: , Rfl:  .  lovastatin (MEVACOR) 40 MG tablet, Take 40 mg by mouth at bedtime., Disp: , Rfl:  .  quinapril (ACCUPRIL) 10 MG tablet, Take 10 mg by mouth daily., Disp: , Rfl:   BP (!) 142/76   Pulse 69   Temp 97.9 F (36.6 C)   Ht 5' (1.524 m)   Wt 165 lb (74.8 kg)   BMI 32.22 kg/m   Physical Exam Vitals signs and nursing note reviewed.  Constitutional:      Appearance: Normal appearance.  Neurological:     Mental Status: She is alert and oriented to person, place, and time.  Psychiatric:        Mood and Affect: Mood normal.    Right thumb evaluation TENDERNESS CMC joint no tenderness  or DIP joint or MP joint ROM opposition normal INSTABILITY UCL stable MOTOR normal SKIN normal   CDV normal  SENSATION normal LYMPH epitrochlear negative  MEDICAL DECISIONS  Data:   [facility] [place]:  Summation  of records-none   Encounter Diagnoses  Name Primary?  . Thumb pain, right   . CMC arthritis Yes    Diagnostics  Procedure note injection right thumb  Injection right CMC joint Medication Depo-Medrol 40 mg and lidocaine 1% 2 cc The patient gave verbal consent Timeout confirmed the site of injection right CMC joint  Alcohol and ethyl chloride was used to repair the skin and then a 25-gauge needle was used to inject the [facility] joint of the right thumb  There were no complications a sterile Band-Aid was applied   Plan: brace topical nsaid or menthols   Return as needed

## 2019-02-12 NOTE — Patient Instructions (Signed)
These are the muscle and arthrits creams I recommend:  PLEASE READ THE PACKAGE INSTRUCTIONS BEFORE USING   Ben Gay arthritis cream  Icy hot vanishing gel  Aspercreme odor free  Myoflex Oderless pain reliever  Capzasin  Sportscreme  Max freeze  

## 2019-02-19 LAB — NOVEL CORONAVIRUS, NAA: SARS-CoV-2, NAA: NOT DETECTED

## 2019-04-18 DIAGNOSIS — G4733 Obstructive sleep apnea (adult) (pediatric): Secondary | ICD-10-CM | POA: Insufficient documentation

## 2019-08-24 DIAGNOSIS — Z8616 Personal history of COVID-19: Secondary | ICD-10-CM

## 2019-08-24 HISTORY — DX: Personal history of COVID-19: Z86.16

## 2019-09-05 ENCOUNTER — Emergency Department (HOSPITAL_COMMUNITY)
Admission: EM | Admit: 2019-09-05 | Discharge: 2019-09-05 | Disposition: A | Discharge status: Home / Self Care | Payer: Medicare (Managed Care) | Attending: Emergency Medicine | Admitting: Emergency Medicine

## 2019-09-05 ENCOUNTER — Other Ambulatory Visit: Payer: Self-pay

## 2019-09-05 ENCOUNTER — Encounter (HOSPITAL_COMMUNITY): Payer: Self-pay | Admitting: Emergency Medicine

## 2019-09-05 ENCOUNTER — Emergency Department (HOSPITAL_COMMUNITY): Payer: Medicare (Managed Care)

## 2019-09-05 DIAGNOSIS — Z79899 Other long term (current) drug therapy: Secondary | ICD-10-CM | POA: Diagnosis not present

## 2019-09-05 DIAGNOSIS — U071 COVID-19: Secondary | ICD-10-CM | POA: Insufficient documentation

## 2019-09-05 DIAGNOSIS — I10 Essential (primary) hypertension: Secondary | ICD-10-CM | POA: Insufficient documentation

## 2019-09-05 DIAGNOSIS — E119 Type 2 diabetes mellitus without complications: Secondary | ICD-10-CM | POA: Diagnosis not present

## 2019-09-05 DIAGNOSIS — R059 Cough, unspecified: Secondary | ICD-10-CM

## 2019-09-05 DIAGNOSIS — J111 Influenza due to unidentified influenza virus with other respiratory manifestations: Secondary | ICD-10-CM

## 2019-09-05 DIAGNOSIS — Z87891 Personal history of nicotine dependence: Secondary | ICD-10-CM | POA: Insufficient documentation

## 2019-09-05 DIAGNOSIS — R509 Fever, unspecified: Secondary | ICD-10-CM

## 2019-09-05 DIAGNOSIS — R05 Cough: Secondary | ICD-10-CM

## 2019-09-05 DIAGNOSIS — E876 Hypokalemia: Secondary | ICD-10-CM

## 2019-09-05 LAB — CBC WITH DIFFERENTIAL/PLATELET
Abs Immature Granulocytes: 0.01 10*3/uL (ref 0.00–0.07)
Basophils Absolute: 0 10*3/uL (ref 0.0–0.1)
Basophils Relative: 1 %
Eosinophils Absolute: 0.1 10*3/uL (ref 0.0–0.5)
Eosinophils Relative: 1 %
HCT: 37 % (ref 36.0–46.0)
Hemoglobin: 11 g/dL — ABNORMAL LOW (ref 12.0–15.0)
Immature Granulocytes: 0 %
Lymphocytes Relative: 15 %
Lymphs Abs: 0.8 10*3/uL (ref 0.7–4.0)
MCH: 24.7 pg — ABNORMAL LOW (ref 26.0–34.0)
MCHC: 29.7 g/dL — ABNORMAL LOW (ref 30.0–36.0)
MCV: 83 fL (ref 80.0–100.0)
Monocytes Absolute: 0.8 10*3/uL (ref 0.1–1.0)
Monocytes Relative: 15 %
Neutro Abs: 3.5 10*3/uL (ref 1.7–7.7)
Neutrophils Relative %: 68 %
Platelets: 289 10*3/uL (ref 150–400)
RBC: 4.46 MIL/uL (ref 3.87–5.11)
RDW: 17.3 % — ABNORMAL HIGH (ref 11.5–15.5)
WBC: 5.1 10*3/uL (ref 4.0–10.5)
nRBC: 0 % (ref 0.0–0.2)

## 2019-09-05 LAB — BASIC METABOLIC PANEL
Anion gap: 10 (ref 5–15)
BUN: 13 mg/dL (ref 8–23)
CO2: 26 mmol/L (ref 22–32)
Calcium: 9 mg/dL (ref 8.9–10.3)
Chloride: 103 mmol/L (ref 98–111)
Creatinine, Ser: 1.04 mg/dL — ABNORMAL HIGH (ref 0.44–1.00)
GFR calc Af Amer: >60 mL/min (ref >60)
GFR calc non Af Amer: 54 mL/min — ABNORMAL LOW (ref >60)
Glucose, Bld: 88 mg/dL (ref 70–99)
Potassium: 3 mmol/L — ABNORMAL LOW (ref 3.5–5.1)
Sodium: 139 mmol/L (ref 135–145)

## 2019-09-05 MED ORDER — ACETAMINOPHEN 325 MG PO TABS
650.0000 mg | ORAL_TABLET | Freq: Once | ORAL | Status: AC
Start: 2019-09-05 — End: 2019-09-05
  Administered 2019-09-05: 650 mg via ORAL
  Filled 2019-09-05: qty 2

## 2019-09-05 MED ORDER — ALBUTEROL SULFATE HFA 108 (90 BASE) MCG/ACT IN AERS
2.0000 | INHALATION_SPRAY | Freq: Once | RESPIRATORY_TRACT | Status: AC
  Administered 2019-09-05: 19:00:00 2 via RESPIRATORY_TRACT
  Filled 2019-09-05: qty 6.7

## 2019-09-05 MED ORDER — BENZONATATE 100 MG PO CAPS: 200.0000 mg | ORAL_CAPSULE | Freq: Two times a day (BID) | ORAL | 0 refills | Status: DC | PRN

## 2019-09-05 MED ORDER — POTASSIUM CHLORIDE CRYS ER 20 MEQ PO TBCR
40.0000 meq | EXTENDED_RELEASE_TABLET | Freq: Once | ORAL | Status: AC
  Administered 2019-09-05: 40 meq via ORAL
  Filled 2019-09-05: qty 2

## 2019-09-05 NOTE — ED Triage Notes (Signed)
Patient reports dry cough, chills, dizziness, and headache that started yesterday.

## 2019-09-05 NOTE — ED Provider Notes (Signed)
[facility] Provider Note   CSN: [id number] Arrival date & time: 09/05/19  1421     History Chief Complaint  Patient presents with  . Cough    Nicole Horn is a 74 y.o. female.  HPI   This patient is a 74 year old female, she has a known history of diabetes hypertension and high cholesterol.  The patient reports that she has been sick for the last 24 hours or so after she developed some subjective fevers, chills, body aches felt like she was shaking all night long under the covers and has had a dry cough.  No sick contacts.  Has otherwise been well, no vomiting or diarrhea, she has chronic mild swelling of the legs.  She does have a history of heart disease and has been having just a mild chest discomfort with coughing.  Nothing seems to make this better or worse, has not had any medications prior to arrival.  No prior history of lung disease chronically  Past Medical History:  Diagnosis Date  . Diabetes mellitus without complication (HCC)    no medications, uses diet and exercise to control  . High cholesterol   . Hypertension   . Sleep apnea    Pt supposed to wear CPAP, but doesn't.    Patient Active Problem List   Diagnosis Date Noted  . 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 ABDOMINAL HYSTERECTOMY    . BACK SURGERY    . BARTHOLIN GLAND CYST EXCISION Left 03/04/2015   Procedure: EXCISION OF LEFT BARTHOLINS TUMOR;  Surgeon: [name], MD;  Location: [facility];  Service: Gynecology;  Laterality: Left;  . CERVICAL SPINE SURGERY    . CYST REMOVAL TRUNK    . multiple cyst removal surgeries       OB History    Gravida      Para      Term      Preterm      AB      Living  1     SAB      TAB      Ectopic      Multiple      Live Births              Family History  Problem Relation Age of Onset  . Cancer Mother        colon  . Cancer Sister    breast  . Cancer Maternal Aunt        stomach    Social History   Tobacco Use  . Smoking status: Former Smoker    Packs/day: 2.00    Years: 25.00    Pack years: 50.00    Types: Cigarettes  . Smokeless tobacco: Never Used  Substance Use Topics  . Alcohol use: Yes    Alcohol/week: 0.0 standard drinks    Comment: socially  . Drug use: No    Home Medications Prior to Admission medications   Medication Sig Start Date End Date Taking? Authorizing Provider  acyclovir (ZOVIRAX) 400 MG tablet TAKE 1 TABLET BY MOUTH TWICE A DAY 02/06/16   [name], MD  acyclovir (ZOVIRAX) 400 MG tablet TAKE 1 TABLET BY MOUTH TWICE A DAY 06/07/16   [name], MD  aspirin EC 81 MG tablet Take 81 mg by mouth daily.    [provider]  benzonatate (TESSALON) 100 MG capsule Take 2 capsules (200 mg total)  by mouth 2 (two) times daily as needed for cough. 09/05/19   Noemi Chapel, MD  hydrALAZINE (APRESOLINE) 25 MG tablet Take 25 mg by mouth every 8 (eight) hours.    [provider]  lovastatin (MEVACOR) 40 MG tablet Take 40 mg by mouth at bedtime.    [provider]  quinapril (ACCUPRIL) 10 MG tablet Take 10 mg by mouth daily.    [provider]    Allergies    Patient has no known allergies.  Review of Systems   Review of Systems  All other systems reviewed and are negative.   Physical Exam Updated Vital Signs BP (!) 152/65 (BP Location: Right Arm)   Pulse 80   Temp 99.3 F (37.4 C) (Oral)   Resp (!) 22   Ht 1.524 m (5')   Wt 72.6 kg   SpO2 93%   BMI 31.25 kg/m   Physical Exam Vitals and nursing note reviewed.  Constitutional:      General: She is not in acute distress.    Appearance: She is well-developed.  HENT:     Head: Normocephalic and atraumatic.     Mouth/Throat:     Pharynx: No oropharyngeal exudate.  Eyes:     General: No scleral icterus.       Right eye: No discharge.        Left eye: No discharge.     Conjunctiva/sclera:  Conjunctivae normal.     Pupils: Pupils are equal, round, and reactive to light.  Neck:     Thyroid: No thyromegaly.     Vascular: No JVD.  Cardiovascular:     Rate and Rhythm: Normal rate and regular rhythm.     Heart sounds: Normal heart sounds. No murmur. No friction rub. No gallop.   Pulmonary:     Effort: Pulmonary effort is normal. No respiratory distress.     Breath sounds: Normal breath sounds. No wheezing or rales.  Abdominal:     General: Bowel sounds are normal. There is no distension.     Palpations: Abdomen is soft. There is no mass.     Tenderness: There is no abdominal tenderness.  Musculoskeletal:        General: No tenderness. Normal range of motion.     Cervical back: Normal range of motion and neck supple.     Right lower leg: Edema present.     Left lower leg: Edema present.  Lymphadenopathy:     Cervical: No cervical adenopathy.  Skin:    General: Skin is warm and dry.     Findings: No erythema or rash.  Neurological:     Mental Status: She is alert.     Coordination: Coordination normal.  Psychiatric:        Behavior: Behavior normal.     ED Results / Procedures / Treatments   Labs (all labs ordered are listed, but only abnormal results are displayed) Labs Reviewed  CBC WITH DIFFERENTIAL/PLATELET - Abnormal; Notable for the following components:      Result Value   Hemoglobin 11.0 (*)    MCH 24.7 (*)    MCHC 29.7 (*)    RDW 17.3 (*)    All other components within normal limits  BASIC METABOLIC PANEL - Abnormal; Notable for the following components:   Potassium 3.0 (*)    Creatinine, Ser 1.04 (*)    GFR calc non Af Amer 54 (*)    All other components within normal limits  SARS CORONAVIRUS 2 (TAT  6-24 HRS)    EKG EKG Interpretation  Date/Time:  Wednesday September 05 2019 18:59:00 EST Ventricular Rate:  84 PR Interval:    QRS Duration: 95 QT Interval:  407 QTC Calculation: 482 R Axis:   48 Text Interpretation: Sinus rhythm Borderline T  wave abnormalities since last tracing no significant change Confirmed by Noemi Chapel [id number]) on 09/05/2019 7:18:07 PM   Radiology DG Chest Port 1 View  Result Date: 09/05/2019 CLINICAL DATA:  74 year old female with cough and fever. EXAM: PORTABLE CHEST 1 VIEW COMPARISON:  Chest radiograph dated 08/19/2009 FINDINGS: Shallow inspiration. No focal consolidation, pleural effusion, or pneumothorax. Stable cardiac silhouette. Atherosclerotic calcification of the aorta. Degenerative changes of the spine. No acute osseous pathology. IMPRESSION: No focal consolidation. Electronically Signed   By: Anner Crete M.D.   On: 09/05/2019 19:33    Procedures Procedures (including critical care time)  Medications Ordered in ED Medications  acetaminophen (TYLENOL) tablet 650 mg (650 mg Oral Given 09/05/19 1536)  potassium chloride SA (KLOR-CON) CR tablet 40 mEq (40 mEq Oral Given 09/05/19 1926)  albuterol (VENTOLIN HFA) 108 (90 Base) MCG/ACT inhaler 2 puff (2 puffs Inhalation Given 09/05/19 1926)    ED Course  I have reviewed the triage vital signs and the nursing notes.  Pertinent labs & imaging results that were available during my care of the patient were reviewed by me and considered in my medical decision making (see chart for details).  Clinical Course as of Sep 04 1953  Wed Sep 05, 2019  1952 X-rays negative for focal infiltrates.  I have personally viewed the x-ray and agree with the radiologist interpretation.  She has no leukocytosis and electrolytes are normal except for potassium which has been replaced.  The patient is stable for discharge with no hypoxia, no tachycardia and her fever has resolved.  She is agreeable.  She likely has Covid and has been given precautions.   [BM]    Clinical Course User Index [BM] Noemi Chapel, MD   MDM Rules/Calculators/A&P                      The patient appears very well, she has no acute findings on exam of concern.  Her respiratory rate is normal,  lungs are totally clear and oxygen is 97 to 98% on room air.  She is not tachycardic, she is normotensive and at this time is afebrile after getting Tylenol.  She had a temperature of 100.4 on arrival.  I suspect that she has some type of viral syndrome, we will rule out a bacterial pneumonia however she has no leukocytosis and given how well she is appearing I suspect this is more related to a viral process.  She does have scant bilateral lower extremity edema which she states is chronic, it is not asymmetrical and she has no other risk for pulmonary embolism.  The patient is agreeable to the plan  She has a low potassium and will have this replaced.  Final Clinical Impression(s) / ED Diagnoses Final diagnoses:  Cough  Influenza-like illness  Hypokalemia    Rx / DC Orders ED Discharge Orders         Ordered    benzonatate (TESSALON) 100 MG capsule  2 times daily PRN     09/05/19 1954           Noemi Chapel, MD 09/05/19 1955

## 2019-09-05 NOTE — Discharge Instructions (Signed)
Your testing today shows that your potassium was slightly low.  Your x-ray was totally normal showing no signs of pneumonia.  Your coronavirus test will be back within the next 24 hours.  Until that time please quarantine.  Do not go around anybody else but stay at home away from other people as you may be contagious to others.  If your test is positive you will need to be at home for approximately 10 days in total quarantine and isolation.  If you should develop increasing or worsening symptoms you may come back to the hospital for evaluation.  You may take the following medications as needed:  Tessalon, 200 mg every 8 hours as needed for coughing  Albuterol, 2 puffs every 4 hours as needed for coughing or wheezing  Tylenol, 650 mg, alternating every 4 hours with ibuprofen 600 mg, for fever or body aches.

## 2019-09-06 ENCOUNTER — Emergency Department (HOSPITAL_COMMUNITY)
Admission: EM | Admit: 2019-09-06 | Discharge: 2019-09-06 | Disposition: A | Discharge status: Home / Self Care | Payer: Medicare (Managed Care) | Attending: Emergency Medicine | Admitting: Emergency Medicine

## 2019-09-06 ENCOUNTER — Encounter (HOSPITAL_COMMUNITY): Payer: Self-pay

## 2019-09-06 ENCOUNTER — Other Ambulatory Visit: Payer: Self-pay

## 2019-09-06 ENCOUNTER — Emergency Department (HOSPITAL_COMMUNITY): Payer: Medicare (Managed Care)

## 2019-09-06 DIAGNOSIS — R197 Diarrhea, unspecified: Secondary | ICD-10-CM | POA: Diagnosis not present

## 2019-09-06 DIAGNOSIS — J1282 Pneumonia due to coronavirus disease 2019: Secondary | ICD-10-CM | POA: Diagnosis not present

## 2019-09-06 DIAGNOSIS — U071 COVID-19: Secondary | ICD-10-CM | POA: Insufficient documentation

## 2019-09-06 DIAGNOSIS — R112 Nausea with vomiting, unspecified: Secondary | ICD-10-CM | POA: Insufficient documentation

## 2019-09-06 DIAGNOSIS — R42 Dizziness and giddiness: Secondary | ICD-10-CM | POA: Diagnosis present

## 2019-09-06 LAB — BASIC METABOLIC PANEL
Anion gap: 15 (ref 5–15)
BUN: 19 mg/dL (ref 8–23)
CO2: 25 mmol/L (ref 22–32)
Calcium: 9 mg/dL (ref 8.9–10.3)
Chloride: 99 mmol/L (ref 98–111)
Creatinine, Ser: 1.46 mg/dL — ABNORMAL HIGH (ref 0.44–1.00)
GFR calc Af Amer: 41 mL/min — ABNORMAL LOW (ref >60)
GFR calc non Af Amer: 36 mL/min — ABNORMAL LOW (ref >60)
Glucose, Bld: 111 mg/dL — ABNORMAL HIGH (ref 70–99)
Potassium: 3.3 mmol/L — ABNORMAL LOW (ref 3.5–5.1)
Sodium: 139 mmol/L (ref 135–145)

## 2019-09-06 LAB — CBC
HCT: 36.6 % (ref 36.0–46.0)
Hemoglobin: 11 g/dL — ABNORMAL LOW (ref 12.0–15.0)
MCH: 24.6 pg — ABNORMAL LOW (ref 26.0–34.0)
MCHC: 30.1 g/dL (ref 30.0–36.0)
MCV: 81.9 fL (ref 80.0–100.0)
Platelets: 260 10*3/uL (ref 150–400)
RBC: 4.47 MIL/uL (ref 3.87–5.11)
RDW: 17.8 % — ABNORMAL HIGH (ref 11.5–15.5)
WBC: 5.1 10*3/uL (ref 4.0–10.5)
nRBC: 0 % (ref 0.0–0.2)

## 2019-09-06 LAB — SARS CORONAVIRUS 2 (TAT 6-24 HRS): SARS Coronavirus 2: POSITIVE — AB

## 2019-09-06 MED ORDER — FAMOTIDINE IN NACL 20-0.9 MG/50ML-% IV SOLN
20.0000 mg | Freq: Once | INTRAVENOUS | Status: AC
  Administered 2019-09-06: 20 mg via INTRAVENOUS
  Filled 2019-09-06: qty 50

## 2019-09-06 MED ORDER — FAMOTIDINE 20 MG PO TABS: 20.0000 mg | ORAL_TABLET | Freq: Two times a day (BID) | ORAL | 0 refills | Status: DC

## 2019-09-06 MED ORDER — SODIUM CHLORIDE 0.9 % IV SOLN: Freq: Once | INTRAVENOUS | Status: AC

## 2019-09-06 MED ORDER — DEXAMETHASONE SODIUM PHOSPHATE 10 MG/ML IJ SOLN
10.0000 mg | Freq: Once | INTRAMUSCULAR | Status: AC
  Administered 2019-09-06: 10 mg via INTRAVENOUS
  Filled 2019-09-06: qty 1

## 2019-09-06 MED ORDER — ONDANSETRON HCL 4 MG/2ML IJ SOLN
4.0000 mg | Freq: Once | INTRAMUSCULAR | Status: AC
  Administered 2019-09-06: 4 mg via INTRAVENOUS
  Filled 2019-09-06: qty 2

## 2019-09-06 MED ORDER — DOXYCYCLINE HYCLATE 100 MG PO TABS
100.0000 mg | ORAL_TABLET | Freq: Once | ORAL | Status: AC
  Administered 2019-09-06: 100 mg via ORAL
  Filled 2019-09-06: qty 1

## 2019-09-06 MED ORDER — DOXYCYCLINE HYCLATE 100 MG PO CAPS: 100.0000 mg | ORAL_CAPSULE | Freq: Two times a day (BID) | ORAL | 0 refills | Status: DC

## 2019-09-06 MED ORDER — ACETAMINOPHEN 325 MG PO TABS
650.0000 mg | ORAL_TABLET | Freq: Once | ORAL | Status: AC | PRN
  Administered 2019-09-06: 650 mg via ORAL
  Filled 2019-09-06: qty 2

## 2019-09-06 NOTE — ED Triage Notes (Signed)
Pt reports continued dizziness for the past 2 days. Was seen at [facility] last night, tested positive for COVID. Pt now reports hematemesis x1 episode today, denies nausea at this time. Pt a.o resp e.u

## 2019-09-06 NOTE — ED Notes (Signed)
Pt ambulates easily in room, dresses independently

## 2019-09-06 NOTE — ED Notes (Signed)
Noted pt spO2 91% on RA in bed at rest (good pleth), 1.5L provided via [place], improved to 96%.

## 2019-09-06 NOTE — ED Provider Notes (Signed)
[facility] EMERGENCY DEPARTMENT Provider Note   CSN: [id number] Arrival date & time: 09/06/19  1538     History Chief Complaint  Patient presents with  . COVID +  . Dizziness  . Hematemesis    Nicole Horn is a 74 y.o. female.  Pt presents to the ED today with dizziness, nausea/vomiting, diarrhea.  The pt was seen at [facility] last night for sx of Covid.  She was positive, but oxygenating well, so she was sent home.  The pt said she had 1 episode of vomiting blood this morning.  She had diarrhea with it.  She feels "woozy" now.  The pt did have a fever at triage and was given tylenol.  Pt has had a cough.        Past Medical History:  Diagnosis Date  . Diabetes mellitus without complication (HCC)    no medications, uses diet and exercise to control  . High cholesterol   . Hypertension   . Sleep apnea    Pt supposed to wear CPAP, but doesn't.    Patient Active Problem List   Diagnosis Date Noted  . 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 ABDOMINAL HYSTERECTOMY    . BACK SURGERY    . BARTHOLIN GLAND CYST EXCISION Left 03/04/2015   Procedure: EXCISION OF LEFT BARTHOLINS TUMOR;  Surgeon: [name], MD;  Location: [facility];  Service: Gynecology;  Laterality: Left;  . CERVICAL SPINE SURGERY    . CYST REMOVAL TRUNK    . multiple cyst removal surgeries       OB History    Gravida      Para      Term      Preterm      AB      Living  1     SAB      TAB      Ectopic      Multiple      Live Births              Family History  Problem Relation Age of Onset  . Cancer Mother        colon  . Cancer Sister        breast  . Cancer Maternal Aunt        stomach    Social History   Tobacco Use  . Smoking status: Former Smoker    Packs/day: 2.00    Years: 25.00    Pack years: 50.00    Types: Cigarettes  . Smokeless tobacco: Never Used  Substance Use  Topics  . Alcohol use: Yes    Alcohol/week: 0.0 standard drinks    Comment: socially  . Drug use: No    Home Medications Prior to Admission medications   Medication Sig Start Date End Date Taking? Authorizing Provider  acyclovir (ZOVIRAX) 400 MG tablet TAKE 1 TABLET BY MOUTH TWICE A DAY 02/06/16   [name], MD  acyclovir (ZOVIRAX) 400 MG tablet TAKE 1 TABLET BY MOUTH TWICE A DAY 06/07/16   [name], MD  aspirin EC 81 MG tablet Take 81 mg by mouth daily.    [provider]  benzonatate (TESSALON) 100 MG capsule Take 2 capsules (200 mg total) by mouth 2 (two) times daily as needed for cough. 09/05/19   Noemi Chapel, MD  doxycycline (VIBRAMYCIN) 100 MG capsule Take 1 capsule (100 mg total) by mouth  2 (two) times daily. 09/06/19   Isla Pence, MD  famotidine (PEPCID) 20 MG tablet Take 1 tablet (20 mg total) by mouth 2 (two) times daily. 09/06/19   Isla Pence, MD  hydrALAZINE (APRESOLINE) 25 MG tablet Take 25 mg by mouth every 8 (eight) hours.    [provider]  lovastatin (MEVACOR) 40 MG tablet Take 40 mg by mouth at bedtime.    [provider]  quinapril (ACCUPRIL) 10 MG tablet Take 10 mg by mouth daily.    [provider]    Allergies    Patient has no known allergies.  Review of Systems   Review of Systems  Constitutional: Positive for fever.  Respiratory: Positive for cough.   Gastrointestinal: Positive for diarrhea, nausea and vomiting.  All other systems reviewed and are negative.   Physical Exam Updated Vital Signs BP (!) 115/59   Pulse 91   Temp (!) 102.6 F (39.2 C) (Oral)   Resp 18   SpO2 98%   Physical Exam Vitals and nursing note reviewed.  Constitutional:      Appearance: Normal appearance.  HENT:     Head: Normocephalic and atraumatic.     Right Ear: External ear normal.     Left Ear: External ear normal.     Nose: Nose normal.     Mouth/Throat:     Mouth: Mucous membranes are moist.     Pharynx:  Oropharynx is clear.  Eyes:     Extraocular Movements: Extraocular movements intact.     Conjunctiva/sclera: Conjunctivae normal.     Pupils: Pupils are equal, round, and reactive to light.  Cardiovascular:     Rate and Rhythm: Regular rhythm. Tachycardia present.     Pulses: Normal pulses.     Heart sounds: Normal heart sounds.  Pulmonary:     Effort: Pulmonary effort is normal.     Breath sounds: Normal breath sounds.  Abdominal:     General: Abdomen is flat. Bowel sounds are normal.     Palpations: Abdomen is soft.  Musculoskeletal:        General: Normal range of motion.     Cervical back: Normal range of motion and neck supple.  Skin:    General: Skin is warm.     Capillary Refill: Capillary refill takes less than 2 seconds.  Neurological:     General: No focal deficit present.     Mental Status: She is alert and oriented to person, place, and time.  Psychiatric:        Mood and Affect: Mood normal.        Behavior: Behavior normal.        Thought Content: Thought content normal.        Judgment: Judgment normal.     ED Results / Procedures / Treatments   Labs (all labs ordered are listed, but only abnormal results are displayed) Labs Reviewed  BASIC METABOLIC PANEL - Abnormal; Notable for the following components:      Result Value   Potassium 3.3 (*)    Glucose, Bld 111 (*)    Creatinine, Ser 1.46 (*)    GFR calc non Af Amer 36 (*)    GFR calc Af Amer 41 (*)    All other components within normal limits  CBC - Abnormal; Notable for the following components:   Hemoglobin 11.0 (*)    MCH 24.6 (*)    RDW 17.8 (*)    All other components within normal limits  EKG None  Radiology DG Chest Portable 1 View  Result Date: 09/06/2019 CLINICAL DATA:  Shortness of breath EXAM: PORTABLE CHEST 1 VIEW COMPARISON:  09/05/2019 FINDINGS: New left lung base opacity. Low lung volumes. Stable cardiomediastinal silhouette with aortic atherosclerosis. No pneumothorax.  IMPRESSION: Low lung volumes. New airspace disease at the left base concerning for a pneumonia. Electronically Signed   By: Donavan Foil M.D.   On: 09/06/2019 17:57   DG Chest Port 1 View  Result Date: 09/05/2019 CLINICAL DATA:  74 year old female with cough and fever. EXAM: PORTABLE CHEST 1 VIEW COMPARISON:  Chest radiograph dated 08/19/2009 FINDINGS: Shallow inspiration. No focal consolidation, pleural effusion, or pneumothorax. Stable cardiac silhouette. Atherosclerotic calcification of the aorta. Degenerative changes of the spine. No acute osseous pathology. IMPRESSION: No focal consolidation. Electronically Signed   By: Anner Crete M.D.   On: 09/05/2019 19:33    Procedures Procedures (including critical care time)  Medications Ordered in ED Medications  doxycycline (VIBRA-TABS) tablet 100 mg (has no administration in time range)  dexamethasone (DECADRON) injection 10 mg (has no administration in time range)  acetaminophen (TYLENOL) tablet 650 mg (650 mg Oral Given 09/06/19 1621)  0.9 %  sodium chloride infusion ( Intravenous New Bag/Given 09/06/19 1756)  famotidine (PEPCID) IVPB 20 mg premix (20 mg Intravenous New Bag/Given 09/06/19 1748)  ondansetron (ZOFRAN) injection 4 mg (4 mg Intravenous Given 09/06/19 1756)    ED Course  I have reviewed the triage vital signs and the nursing notes.  Pertinent labs & imaging results that were available during my care of the patient were reviewed by me and considered in my medical decision making (see chart for details).    MDM Rules/Calculators/A&P                     Pt is feeling much better.  Her O2 sats drop a little with movement, but with rest, they come right back up.  Pt will be given decadron in ED.  I won't send her home with prednisone as she has dm and had hematemesis.  Pt has not had any further hematemesis and h/h is stable from yesterday.  Pt knows to return if worse.  Lundynn Hollingworth was evaluated in Emergency  Department on 09/06/2019 for the symptoms described in the history of present illness. She was evaluated in the context of the global COVID-19 pandemic, which necessitated consideration that the patient might be at risk for infection with the SARS-CoV-2 virus that causes COVID-19. Institutional protocols and algorithms that pertain to the evaluation of patients at risk for COVID-19 are in a state of rapid change based on information released by regulatory bodies including the CDC and federal and state organizations. These policies and algorithms were followed during the patient's care in the ED.   Final Clinical Impression(s) / ED Diagnoses Final diagnoses:  Pneumonia due to COVID-19 virus  Non-intractable vomiting with nausea, unspecified vomiting type  Diarrhea, unspecified type    Rx / DC Orders ED Discharge Orders         Ordered    doxycycline (VIBRAMYCIN) 100 MG capsule  2 times daily     09/06/19 1839    famotidine (PEPCID) 20 MG tablet  2 times daily     09/06/19 1839           Isla Pence, MD 09/06/19 1853

## 2019-09-06 NOTE — ED Notes (Signed)
Patient verbalizes understanding of discharge instructions. Opportunity for questioning and answers were provided. Armband removed by staff, pt discharged from ED to home via POV  

## 2019-09-07 ENCOUNTER — Telehealth (HOSPITAL_COMMUNITY): Payer: Self-pay

## 2019-09-07 ENCOUNTER — Telehealth: Payer: Self-pay | Admitting: Unknown Physician Specialty

## 2019-09-07 NOTE — Telephone Encounter (Signed)
Discussed with patient about Covid symptoms and the use of bamlanivimab, a monoclonal antibody infusion for those with mild to moderate Covid symptoms and at a high risk of hospitalization.  Pt is qualified for this infusion at the [facility] infusion center due to Age > 73 and Hypertension   Discussed benefits and risks of infusion.  She would like to think about the infusion and will call me back

## 2019-09-09 ENCOUNTER — Telehealth: Payer: Self-pay | Admitting: Unknown Physician Specialty

## 2019-09-09 ENCOUNTER — Other Ambulatory Visit: Payer: Self-pay | Admitting: Unknown Physician Specialty

## 2019-09-09 DIAGNOSIS — U071 COVID-19: Secondary | ICD-10-CM

## 2019-09-09 NOTE — Telephone Encounter (Signed)
Called to confirm mab infusion for Covid 19 as she was "thinking about in".  She is planning on coming.  Orders entered

## 2019-09-10 ENCOUNTER — Inpatient Hospital Stay (HOSPITAL_COMMUNITY)
Admission: AD | Admit: 2019-09-10 | Discharge: 2019-09-15 | DRG: 177 | Disposition: A | Discharge status: Home / Self Care | Payer: Medicare (Managed Care) | Source: Ambulatory Visit | Attending: Internal Medicine | Admitting: Internal Medicine

## 2019-09-10 ENCOUNTER — Inpatient Hospital Stay (HOSPITAL_COMMUNITY): Payer: Medicare (Managed Care)

## 2019-09-10 ENCOUNTER — Other Ambulatory Visit: Payer: Self-pay

## 2019-09-10 ENCOUNTER — Ambulatory Visit (HOSPITAL_COMMUNITY)
Admission: RE | Admit: 2019-09-10 | Discharge: 2019-09-10 | Disposition: A | Discharge status: Home / Self Care | Payer: Medicare (Managed Care) | Source: Ambulatory Visit | Attending: Pulmonary Disease | Admitting: Pulmonary Disease

## 2019-09-10 ENCOUNTER — Encounter (HOSPITAL_COMMUNITY): Payer: Self-pay | Admitting: Internal Medicine

## 2019-09-10 DIAGNOSIS — U071 COVID-19: Secondary | ICD-10-CM

## 2019-09-10 DIAGNOSIS — E876 Hypokalemia: Secondary | ICD-10-CM | POA: Diagnosis present

## 2019-09-10 DIAGNOSIS — J9601 Acute respiratory failure with hypoxia: Secondary | ICD-10-CM | POA: Diagnosis present

## 2019-09-10 DIAGNOSIS — E119 Type 2 diabetes mellitus without complications: Secondary | ICD-10-CM | POA: Diagnosis present

## 2019-09-10 DIAGNOSIS — Z87891 Personal history of nicotine dependence: Secondary | ICD-10-CM

## 2019-09-10 DIAGNOSIS — G4730 Sleep apnea, unspecified: Secondary | ICD-10-CM | POA: Diagnosis present

## 2019-09-10 DIAGNOSIS — J1282 Pneumonia due to coronavirus disease 2019: Secondary | ICD-10-CM | POA: Diagnosis present

## 2019-09-10 DIAGNOSIS — E785 Hyperlipidemia, unspecified: Secondary | ICD-10-CM | POA: Diagnosis present

## 2019-09-10 DIAGNOSIS — Z800 Family history of malignant neoplasm of digestive organs: Secondary | ICD-10-CM | POA: Diagnosis not present

## 2019-09-10 DIAGNOSIS — R0902 Hypoxemia: Secondary | ICD-10-CM

## 2019-09-10 DIAGNOSIS — Z803 Family history of malignant neoplasm of breast: Secondary | ICD-10-CM

## 2019-09-10 DIAGNOSIS — Z7982 Long term (current) use of aspirin: Secondary | ICD-10-CM

## 2019-09-10 DIAGNOSIS — J069 Acute upper respiratory infection, unspecified: Secondary | ICD-10-CM

## 2019-09-10 DIAGNOSIS — E7800 Pure hypercholesterolemia, unspecified: Secondary | ICD-10-CM | POA: Diagnosis present

## 2019-09-10 DIAGNOSIS — I10 Essential (primary) hypertension: Secondary | ICD-10-CM | POA: Diagnosis present

## 2019-09-10 HISTORY — DX: Acute upper respiratory infection, unspecified: J06.9

## 2019-09-10 HISTORY — DX: Pneumonia due to coronavirus disease 2019: J12.82

## 2019-09-10 HISTORY — DX: COVID-19: U07.1

## 2019-09-10 LAB — COMPREHENSIVE METABOLIC PANEL
ALT: 44 U/L (ref 0–44)
AST: 57 U/L — ABNORMAL HIGH (ref 15–41)
Albumin: 3.2 g/dL — ABNORMAL LOW (ref 3.5–5.0)
Alkaline Phosphatase: 38 U/L (ref 38–126)
Anion gap: 10 (ref 5–15)
BUN: 28 mg/dL — ABNORMAL HIGH (ref 8–23)
CO2: 28 mmol/L (ref 22–32)
Calcium: 8.4 mg/dL — ABNORMAL LOW (ref 8.9–10.3)
Chloride: 101 mmol/L (ref 98–111)
Creatinine, Ser: 1.4 mg/dL — ABNORMAL HIGH (ref 0.44–1.00)
GFR calc Af Amer: 43 mL/min — ABNORMAL LOW (ref >60)
GFR calc non Af Amer: 37 mL/min — ABNORMAL LOW (ref >60)
Glucose, Bld: 132 mg/dL — ABNORMAL HIGH (ref 70–99)
Potassium: 3 mmol/L — ABNORMAL LOW (ref 3.5–5.1)
Sodium: 139 mmol/L (ref 135–145)
Total Bilirubin: 0.4 mg/dL (ref 0.3–1.2)
Total Protein: 6.8 g/dL (ref 6.5–8.1)

## 2019-09-10 LAB — GLUCOSE, CAPILLARY: Glucose-Capillary: 134 mg/dL — ABNORMAL HIGH (ref 70–99)

## 2019-09-10 LAB — C-REACTIVE PROTEIN: CRP: 6.3 mg/dL — ABNORMAL HIGH (ref <1.0)

## 2019-09-10 LAB — PROCALCITONIN: Procalcitonin: 0.5 ng/mL

## 2019-09-10 LAB — FERRITIN: Ferritin: 66 ng/mL (ref 11–307)

## 2019-09-10 LAB — D-DIMER, QUANTITATIVE: D-Dimer, Quant: 0.36 ug/mL-FEU (ref 0.00–0.50)

## 2019-09-10 MED ORDER — GUAIFENESIN-DM 100-10 MG/5ML PO SYRP
10.0000 mL | ORAL_SOLUTION | ORAL | Status: DC | PRN
  Administered 2019-09-11: 10 mL via ORAL
  Filled 2019-09-10: qty 10

## 2019-09-10 MED ORDER — ASPIRIN EC 81 MG PO TBEC
81.0000 mg | DELAYED_RELEASE_TABLET | Freq: Every day | ORAL | Status: DC
  Administered 2019-09-11 – 2019-09-15 (×5): 81 mg via ORAL
  Filled 2019-09-10 (×7): qty 1

## 2019-09-10 MED ORDER — AMLODIPINE BESYLATE 10 MG PO TABS: 10.0000 mg | ORAL_TABLET | Freq: Every day | ORAL | Status: DC

## 2019-09-10 MED ORDER — ENOXAPARIN SODIUM 40 MG/0.4ML ~~LOC~~ SOLN: 40.0000 mg | SUBCUTANEOUS | Status: DC

## 2019-09-10 MED ORDER — ENOXAPARIN SODIUM 40 MG/0.4ML ~~LOC~~ SOLN
40.0000 mg | SUBCUTANEOUS | Status: DC
  Administered 2019-09-10 – 2019-09-15 (×6): 40 mg via SUBCUTANEOUS
  Filled 2019-09-10 (×5): qty 0.4

## 2019-09-10 MED ORDER — ZINC SULFATE 220 (50 ZN) MG PO CAPS: 220.0000 mg | ORAL_CAPSULE | Freq: Every day | ORAL | Status: DC

## 2019-09-10 MED ORDER — FAMOTIDINE 20 MG PO TABS
20.0000 mg | ORAL_TABLET | Freq: Two times a day (BID) | ORAL | Status: DC
  Administered 2019-09-10 – 2019-09-15 (×10): 20 mg via ORAL
  Filled 2019-09-10 (×10): qty 1

## 2019-09-10 MED ORDER — ACETAMINOPHEN 325 MG PO TABS
650.0000 mg | ORAL_TABLET | Freq: Four times a day (QID) | ORAL | Status: DC | PRN
  Administered 2019-09-11 – 2019-09-14 (×3): 650 mg via ORAL
  Filled 2019-09-10 (×3): qty 2

## 2019-09-10 MED ORDER — POLYETHYLENE GLYCOL 3350 17 G PO PACK: 17.0000 g | PACK | Freq: Every day | ORAL | Status: DC | PRN

## 2019-09-10 MED ORDER — ENOXAPARIN SODIUM 30 MG/0.3ML ~~LOC~~ SOLN: 30.0000 mg | SUBCUTANEOUS | Status: DC

## 2019-09-10 MED ORDER — SODIUM CHLORIDE 0.9 % IV SOLN
700.0000 mg | Freq: Once | INTRAVENOUS | Status: DC
  Filled 2019-09-10: qty 20

## 2019-09-10 MED ORDER — ONDANSETRON HCL 4 MG PO TABS: 4.0000 mg | ORAL_TABLET | Freq: Four times a day (QID) | ORAL | Status: DC | PRN

## 2019-09-10 MED ORDER — ROSUVASTATIN CALCIUM 5 MG PO TABS
40.0000 mg | ORAL_TABLET | Freq: Every day | ORAL | Status: DC
  Administered 2019-09-10 – 2019-09-14 (×5): 40 mg via ORAL
  Filled 2019-09-10 (×4): qty 8
  Filled 2019-09-10: qty 2

## 2019-09-10 MED ORDER — ASCORBIC ACID 500 MG PO TABS: 500.0000 mg | ORAL_TABLET | Freq: Every day | ORAL | Status: DC

## 2019-09-10 MED ORDER — ADULT MULTIVITAMIN W/MINERALS CH
1.0000 | ORAL_TABLET | Freq: Every day | ORAL | Status: DC
  Administered 2019-09-10 – 2019-09-15 (×6): 1 via ORAL
  Filled 2019-09-10 (×6): qty 1

## 2019-09-10 MED ORDER — ACETAMINOPHEN 325 MG PO TABS: 650.0000 mg | ORAL_TABLET | Freq: Four times a day (QID) | ORAL | Status: DC | PRN

## 2019-09-10 MED ORDER — ALBUTEROL SULFATE HFA 108 (90 BASE) MCG/ACT IN AERS: 2.0000 | INHALATION_SPRAY | Freq: Once | RESPIRATORY_TRACT | Status: DC | PRN

## 2019-09-10 MED ORDER — POTASSIUM CHLORIDE CRYS ER 20 MEQ PO TBCR
40.0000 meq | EXTENDED_RELEASE_TABLET | Freq: Four times a day (QID) | ORAL | Status: AC
  Administered 2019-09-10 – 2019-09-11 (×2): 40 meq via ORAL
  Filled 2019-09-10 (×2): qty 2

## 2019-09-10 MED ORDER — QUINAPRIL HCL 10 MG PO TABS: 20.0000 mg | ORAL_TABLET | Freq: Every day | ORAL | Status: DC

## 2019-09-10 MED ORDER — SODIUM CHLORIDE 0.9 % IV SOLN
INTRAVENOUS | Status: DC | PRN
  Administered 2019-09-10 (×2): 250 mL via INTRAVENOUS

## 2019-09-10 MED ORDER — PRAVASTATIN SODIUM 10 MG PO TABS: 20.0000 mg | ORAL_TABLET | Freq: Every day | ORAL | Status: DC

## 2019-09-10 MED ORDER — INSULIN ASPART 100 UNIT/ML ~~LOC~~ SOLN
0.0000 [IU] | Freq: Three times a day (TID) | SUBCUTANEOUS | Status: DC
  Administered 2019-09-11: 12:00:00 2 [IU] via SUBCUTANEOUS
  Administered 2019-09-12: 3 [IU] via SUBCUTANEOUS
  Administered 2019-09-12: 09:00:00 1 [IU] via SUBCUTANEOUS
  Administered 2019-09-13 (×3): 2 [IU] via SUBCUTANEOUS
  Administered 2019-09-14: 1 [IU] via SUBCUTANEOUS
  Administered 2019-09-14: 09:00:00 2 [IU] via SUBCUTANEOUS
  Administered 2019-09-14: 12:00:00 1 [IU] via SUBCUTANEOUS
  Administered 2019-09-15: 13:00:00 2 [IU] via SUBCUTANEOUS
  Administered 2019-09-15: 09:00:00 3 [IU] via SUBCUTANEOUS

## 2019-09-10 MED ORDER — FAMOTIDINE IN NACL 20-0.9 MG/50ML-% IV SOLN: 20.0000 mg | Freq: Once | INTRAVENOUS | Status: DC | PRN

## 2019-09-10 MED ORDER — DEXAMETHASONE SODIUM PHOSPHATE 10 MG/ML IJ SOLN
6.0000 mg | INTRAMUSCULAR | Status: DC
  Administered 2019-09-10 – 2019-09-14 (×5): 6 mg via INTRAVENOUS
  Filled 2019-09-10 (×5): qty 1

## 2019-09-10 MED ORDER — BISACODYL 5 MG PO TBEC: 5.0000 mg | DELAYED_RELEASE_TABLET | Freq: Every day | ORAL | Status: DC | PRN

## 2019-09-10 MED ORDER — BENZONATATE 100 MG PO CAPS: 200.0000 mg | ORAL_CAPSULE | Freq: Two times a day (BID) | ORAL | Status: DC | PRN

## 2019-09-10 MED ORDER — SODIUM CHLORIDE 0.9 % IV SOLN
200.0000 mg | Freq: Once | INTRAVENOUS | Status: AC
  Administered 2019-09-10: 200 mg via INTRAVENOUS
  Filled 2019-09-10: qty 40

## 2019-09-10 MED ORDER — CLOPIDOGREL BISULFATE 75 MG PO TABS
75.0000 mg | ORAL_TABLET | Freq: Every day | ORAL | Status: DC
  Administered 2019-09-11 – 2019-09-15 (×5): 75 mg via ORAL
  Filled 2019-09-10 (×5): qty 1

## 2019-09-10 MED ORDER — ASCORBIC ACID 500 MG PO TABS
500.0000 mg | ORAL_TABLET | Freq: Every day | ORAL | Status: DC
  Administered 2019-09-10 – 2019-09-15 (×6): 500 mg via ORAL
  Filled 2019-09-10 (×6): qty 1

## 2019-09-10 MED ORDER — METHYLPREDNISOLONE SODIUM SUCC 125 MG IJ SOLR: 125.0000 mg | Freq: Once | INTRAMUSCULAR | Status: DC | PRN

## 2019-09-10 MED ORDER — ZINC SULFATE 220 (50 ZN) MG PO CAPS
220.0000 mg | ORAL_CAPSULE | Freq: Every day | ORAL | Status: DC
  Administered 2019-09-10 – 2019-09-15 (×6): 220 mg via ORAL
  Filled 2019-09-10 (×6): qty 1

## 2019-09-10 MED ORDER — HYDROCOD POLST-CPM POLST ER 10-8 MG/5ML PO SUER: 5.0000 mL | Freq: Two times a day (BID) | ORAL | Status: DC | PRN

## 2019-09-10 MED ORDER — DIPHENHYDRAMINE HCL 50 MG/ML IJ SOLN: 50.0000 mg | Freq: Once | INTRAMUSCULAR | Status: DC | PRN

## 2019-09-10 MED ORDER — HYDRALAZINE HCL 25 MG PO TABS: 25.0000 mg | ORAL_TABLET | Freq: Three times a day (TID) | ORAL | Status: DC

## 2019-09-10 MED ORDER — EPINEPHRINE 0.3 MG/0.3ML IJ SOAJ: 0.3000 mg | Freq: Once | INTRAMUSCULAR | Status: DC | PRN

## 2019-09-10 MED ORDER — ONDANSETRON HCL 4 MG/2ML IJ SOLN
4.0000 mg | Freq: Four times a day (QID) | INTRAMUSCULAR | Status: DC | PRN
  Administered 2019-09-11: 17:00:00 4 mg via INTRAVENOUS
  Filled 2019-09-10: qty 2

## 2019-09-10 MED ORDER — SODIUM CHLORIDE 0.9 % IV SOLN
100.0000 mg | Freq: Every day | INTRAVENOUS | Status: AC
  Administered 2019-09-11 – 2019-09-14 (×4): 100 mg via INTRAVENOUS
  Filled 2019-09-10 (×4): qty 20

## 2019-09-10 NOTE — Progress Notes (Signed)
O2 on room air 77. Pt seems not in distress, but statesit took her an hour to take a shower.   applied 2L -88  4L-98% MD notified.

## 2019-09-10 NOTE — Plan of Care (Signed)
  Problem: Respiratory: Goal: Will maintain a patent airway Outcome: Progressing   Problem: Clinical Measurements: Goal: Will remain free from infection Outcome: Progressing   Problem: Clinical Measurements: Goal: Respiratory complications will improve Outcome: Progressing

## 2019-09-10 NOTE — Progress Notes (Signed)
Pharmacy Medication Storage Note  Storing home medications for [name] in pharmacy secured storage.   Medication storage bag number: [id number]  Delivered to pharmacy @ 1800 09/10/19  Medications will be returned to patient/caregiver upon discharge.  Massiah Longanecker 09/10/19 6:01 PM

## 2019-09-10 NOTE — H&P (Signed)
[facility] H&P   Patient Demographics:    Nicole Horn, is a 74 y.o. female  MRN: [id number]   DOB - 04-10-1946  Admit Date - 09/10/2019  Outpatient Primary MD for the patient is Rosita Fire, MD  Referring MD/NP/PA: Infusion clinic  Patient coming from: infusion clinic  No chief complaint on file.     HPI:    Nicole Horn  is a 74 y.o. female, with past medical history of hypertension, hyperlipidemia, diabetes mellitus diet-controlled, apnea does not wear CPAP, patient was diagnosed with COVID-19 1/14, she was referred to monoclonal antibody infusion clinic, patient was noted to be short of breath, with some increased work of breathing, her oxygen saturation was 77%, she was [facility] to [facility] for direct admission for her acute hypoxic respiratory failure from COVID-19 for pneumonia, and reports she has been feeling weak, with poor appetite, generalized body ache for the last week, diagnosed with COVID-19 1/14, reports her dyspnea over the last 24 hours, denies diarrhea, she does report some cough with blood-tinged sputum. -Patient labs still pending, including CBC, CMP, CRP, D-dimers, her chest x-ray showing worsening of pneumonia.  Review of systems:    In addition to the HPI above,  She reports fever and chills at home, she reports poor appetite No Headache, No changes with Vision or hearing, No problems swallowing food or Liquids, No Chest pain, ports cough, with productive phlegm She reports nausea, vomiting, denies any abdominal pain, Bowel movements are regular, No Blood in stool or Urine, No dysuria, No new skin rashes or bruises, No new joints pains-aches,  No new weakness, tingling, numbness in any extremity, No recent weight gain or loss, No polyuria, polydypsia or polyphagia, No significant Mental Stressors.  A full 10 point Review  of Systems was done, except as stated above, all other Review of Systems were negative.   With Past History of the following :    Past Medical History:  Diagnosis Date  . Diabetes mellitus without complication (HCC)    no medications, uses diet and exercise to control  . High cholesterol   . Hypertension   . Sleep apnea    Pt supposed to wear CPAP, but doesn't.      Past Surgical History:  Procedure Laterality Date  . ABDOMINAL HYSTERECTOMY    . BACK SURGERY    . BARTHOLIN GLAND CYST EXCISION Left 03/04/2015   Procedure: EXCISION OF LEFT BARTHOLINS TUMOR;  Surgeon: [name], MD;  Location: [facility];  Service: Gynecology;  Laterality: Left;  . CERVICAL SPINE SURGERY    . CYST REMOVAL TRUNK    . multiple cyst removal surgeries        Social History:     Social History   Tobacco Use  . Smoking status: Former Smoker    Packs/day: 2.00    Years: 25.00    Pack years: 50.00  Types: Cigarettes  . Smokeless tobacco: Never Used  Substance Use Topics  . Alcohol use: Not Currently    Alcohol/week: 0.0 standard drinks    Comment: socially        Family History :     Family History  Problem Relation Age of Onset  . Cancer Mother        colon  . Cancer Sister        breast  . Cancer Maternal Aunt        stomach     Home Medications:   Prior to Admission medications   Medication Sig Start Date End Date Taking? Authorizing Provider  acyclovir (ZOVIRAX) 400 MG tablet TAKE 1 TABLET BY MOUTH TWICE A DAY 02/06/16   [name], MD  acyclovir (ZOVIRAX) 400 MG tablet TAKE 1 TABLET BY MOUTH TWICE A DAY 06/07/16   [name], MD  aspirin EC 81 MG tablet Take 81 mg by mouth daily.    [provider]  benzonatate (TESSALON) 100 MG capsule Take 2 capsules (200 mg total) by mouth 2 (two) times daily as needed for cough. 09/05/19   Noemi Chapel, MD  doxycycline (VIBRAMYCIN) 100 MG capsule Take 1 capsule (100 mg total) by mouth 2 (two) times daily.  09/06/19   Isla Pence, MD  famotidine (PEPCID) 20 MG tablet Take 1 tablet (20 mg total) by mouth 2 (two) times daily. 09/06/19   Isla Pence, MD  hydrALAZINE (APRESOLINE) 25 MG tablet Take 25 mg by mouth every 8 (eight) hours.    [provider]  lovastatin (MEVACOR) 40 MG tablet Take 40 mg by mouth at bedtime.    [provider]  quinapril (ACCUPRIL) 10 MG tablet Take 10 mg by mouth daily.    [provider]     Allergies:    No Known Allergies   Physical Exam:   Vitals  Blood pressure (!) 126/54, pulse 79, temperature 98.1 F (36.7 C), temperature source Oral, resp. rate (!) 22, height (!) 5" (0.127 m), weight 72.6 kg, SpO2 94 %.   1. General well developed female, laying in bed, in no apparent distress 2. Normal affect and insight, Not Suicidal or Homicidal, Awake Alert, Oriented X 3.  3. No F.N deficits, ALL C.Nerves Intact, Strength 5/5 all 4 extremities, Sensation intact all 4 extremities, Plantars down going.  4. Ears and Eyes appear Normal, Conjunctivae clear, PERRLA. Moist Oral Mucosa.  5. Supple Neck, No JVD, No cervical lymphadenopathy appriciated, No Carotid Bruits.  6. Symmetrical Chest wall movement, Good air movement bilaterally, CTAB.  7. RRR, No Gallops, Rubs or Murmurs, No Parasternal Heave.  8. Positive Bowel Sounds, Abdomen Soft, No tenderness, No organomegaly appriciated,No rebound -guarding or rigidity.  9.  No Cyanosis, Normal Skin Turgor, No Skin Rash or Bruise.  10. Good muscle tone,  joints appear normal , no effusions, Normal ROM.  11. No Palpable Lymph Nodes in Neck or Axillae   Data Review:    CBC Recent Labs  Lab 09/05/19 1628 09/06/19 1618  WBC 5.1 5.1  HGB 11.0* 11.0*  HCT 37.0 36.6  PLT 289 260  MCV 83.0 81.9  MCH 24.7* 24.6*  MCHC 29.7* 30.1  RDW 17.3* 17.8*  LYMPHSABS 0.8  --   MONOABS 0.8  --   EOSABS 0.1  --   BASOSABS 0.0  --     ------------------------------------------------------------------------------------------------------------------  Chemistries  Recent Labs  Lab 09/05/19 1628 09/06/19 1618 09/10/19 1600  NA 139 139 139  K 3.0* 3.3* 3.0*  CL 103 99 101  CO2 26 25 28   GLUCOSE 88 111* 132*  BUN 13 19 28*  CREATININE 1.04* 1.46* 1.40*  CALCIUM 9.0 9.0 8.4*  AST  --   --  57*  ALT  --   --  44  ALKPHOS  --   --  38  BILITOT  --   --  0.4   ------------------------------------------------------------------------------------------------------------------ CrCl cannot be calculated (Unknown ideal weight.). ------------------------------------------------------------------------------------------------------------------ No results for input(s): TSH, T4TOTAL, T3FREE, THYROIDAB in the last 72 hours.  Invalid input(s): FREET3  Coagulation profile No results for input(s): INR, PROTIME in the last 168 hours. ------------------------------------------------------------------------------------------------------------------- Recent Labs    09/10/19 1600  DDIMER 0.36   -------------------------------------------------------------------------------------------------------------------  Cardiac Enzymes No results for input(s): CKMB, TROPONINI, MYOGLOBIN in the last 168 hours.  Invalid input(s): CK ------------------------------------------------------------------------------------------------------------------ No results found for: BNP   ---------------------------------------------------------------------------------------------------------------  Urinalysis    Component Value Date/Time   COLORURINE YELLOW 02/28/2015 [place] 02/28/2015 1011   LABSPEC 1.025 02/28/2015 1011   PHURINE 5.5 02/28/2015 1011   GLUCOSEU NEGATIVE 02/28/2015 1011   HGBUR NEGATIVE 02/28/2015 [place] 02/28/2015 [place] 02/28/2015 1011   PROTEINUR NEGATIVE  02/28/2015 1011   UROBILINOGEN 0.2 02/28/2015 1011   NITRITE NEGATIVE 02/28/2015 1011   LEUKOCYTESUR NEGATIVE 02/28/2015 1011    ----------------------------------------------------------------------------------------------------------------   Imaging Results:    DG Chest Port 1 View  Result Date: 09/10/2019 CLINICAL DATA:  COVID-19 EXAM: PORTABLE CHEST 1 VIEW COMPARISON:  09/06/2019 FINDINGS: Slight interval increase in heterogeneous airspace opacity of the left lung base. The upper lungs remain well aerated. Mild cardiomegaly. IMPRESSION: Slight interval increase in heterogeneous airspace opacity of the left lung base, consistent with worsened infection or aspiration. Electronically Signed   By: Eddie Candle M.D.   On: 09/10/2019 15:38    My personal review of EKG: Rhythm NSR, Rate  76 /min, QTc 438 , no Acute ST changes   Assessment & Plan:    Active Problems:   Pneumonia due to COVID-19 virus   HTN (hypertension)   Hyperlipemia   Acute respiratory disease due to COVID-19 virus  Acute hypoxic respiratory failure due to COVID-19 pneumonia -Chest x-ray significant for multifocal opacity, hypoxic 77% on infusion center, she is currently on 4 L nasal cannula. -start on IV remdesivir. -Start on IV steroids -Was encouraged use incentive spirometry, flutter valve, she was encouraged to get out of bed and to prone -We will obtain inflammatory markers including CRP and D-dimers, will monitor daily. -Antitussives -Continue with zinc and vitamin C  Hypertension -Pressure on the lower side 126/54 on admission, for now I will continue with amlodipine only, hold hydrochlorothiazide and ACE  Hyperlipidemia -Continue with statin  Hypokalemia -We will replete, recheck in a.m.  With history of diabetes mellitus, will start on insulin sliding scale as anticipate high readings in the setting of COVID-19 infection and steroids.  DVT Prophylaxis   Lovenox   AM Labs Ordered, also please  review Full Orders  Family Communication: Admission, patients condition and plan of care including tests being ordered have been discussed with the patient  who indicate understanding and agree with the plan and Code Status.  Code Status Full  Likely DC to  Home  Condition GUARDED    Consults called: None  Admission status:  Inpatient  Time spent in minutes : 60 minutes   Phillips Climes M.D on 09/10/2019 at 6:23 PM  Between  7am to 7pm - Pager - [phone]. After 7pm go to [email or web address] - password [facility]  [facility] - Office  [phone]

## 2019-09-10 NOTE — Discharge Instructions (Signed)

## 2019-09-11 LAB — CBC WITH DIFFERENTIAL/PLATELET
Abs Immature Granulocytes: 0.02 10*3/uL (ref 0.00–0.07)
Abs Immature Granulocytes: 0.02 10*3/uL (ref 0.00–0.07)
Basophils Absolute: 0 10*3/uL (ref 0.0–0.1)
Basophils Absolute: 0 10*3/uL (ref 0.0–0.1)
Basophils Relative: 0 %
Basophils Relative: 0 %
Eosinophils Absolute: 0 10*3/uL (ref 0.0–0.5)
Eosinophils Absolute: 0 10*3/uL (ref 0.0–0.5)
Eosinophils Relative: 0 %
Eosinophils Relative: 0 %
HCT: 34.6 % — ABNORMAL LOW (ref 36.0–46.0)
HCT: 33.3 % — ABNORMAL LOW (ref 36.0–46.0)
Hemoglobin: 10.1 g/dL — ABNORMAL LOW (ref 12.0–15.0)
Hemoglobin: 9.9 g/dL — ABNORMAL LOW (ref 12.0–15.0)
Immature Granulocytes: 1 %
Immature Granulocytes: 1 %
Lymphocytes Relative: 28 %
Lymphocytes Relative: 21 %
Lymphs Abs: 1 10*3/uL (ref 0.7–4.0)
Lymphs Abs: 0.5 10*3/uL — ABNORMAL LOW (ref 0.7–4.0)
MCH: 24.2 pg — ABNORMAL LOW (ref 26.0–34.0)
MCH: 24.4 pg — ABNORMAL LOW (ref 26.0–34.0)
MCHC: 29.2 g/dL — ABNORMAL LOW (ref 30.0–36.0)
MCHC: 29.7 g/dL — ABNORMAL LOW (ref 30.0–36.0)
MCV: 83 fL (ref 80.0–100.0)
MCV: 82 fL (ref 80.0–100.0)
Monocytes Absolute: 0.3 10*3/uL (ref 0.1–1.0)
Monocytes Absolute: 0.1 10*3/uL (ref 0.1–1.0)
Monocytes Relative: 9 %
Monocytes Relative: 5 %
Neutro Abs: 2.3 10*3/uL (ref 1.7–7.7)
Neutro Abs: 1.7 10*3/uL (ref 1.7–7.7)
Neutrophils Relative %: 62 %
Neutrophils Relative %: 73 %
Platelets: 257 10*3/uL (ref 150–400)
Platelets: 301 10*3/uL (ref 150–400)
RBC: 4.17 MIL/uL (ref 3.87–5.11)
RBC: 4.06 MIL/uL (ref 3.87–5.11)
RDW: 17.6 % — ABNORMAL HIGH (ref 11.5–15.5)
RDW: 17 % — ABNORMAL HIGH (ref 11.5–15.5)
WBC: 3.6 10*3/uL — ABNORMAL LOW (ref 4.0–10.5)
WBC: 2.4 10*3/uL — ABNORMAL LOW (ref 4.0–10.5)
nRBC: 0 % (ref 0.0–0.2)
nRBC: 0 % (ref 0.0–0.2)

## 2019-09-11 LAB — ABO/RH: ABO/RH(D): B POS

## 2019-09-11 LAB — COMPREHENSIVE METABOLIC PANEL
ALT: 43 U/L (ref 0–44)
AST: 53 U/L — ABNORMAL HIGH (ref 15–41)
Albumin: 3 g/dL — ABNORMAL LOW (ref 3.5–5.0)
Alkaline Phosphatase: 41 U/L (ref 38–126)
Anion gap: 9 (ref 5–15)
BUN: 29 mg/dL — ABNORMAL HIGH (ref 8–23)
CO2: 30 mmol/L (ref 22–32)
Calcium: 8.8 mg/dL — ABNORMAL LOW (ref 8.9–10.3)
Chloride: 102 mmol/L (ref 98–111)
Creatinine, Ser: 1.21 mg/dL — ABNORMAL HIGH (ref 0.44–1.00)
GFR calc Af Amer: 52 mL/min — ABNORMAL LOW (ref >60)
GFR calc non Af Amer: 45 mL/min — ABNORMAL LOW (ref >60)
Glucose, Bld: 156 mg/dL — ABNORMAL HIGH (ref 70–99)
Potassium: 3.8 mmol/L (ref 3.5–5.1)
Sodium: 141 mmol/L (ref 135–145)
Total Bilirubin: 0.4 mg/dL (ref 0.3–1.2)
Total Protein: 6.8 g/dL (ref 6.5–8.1)

## 2019-09-11 LAB — HEMOGLOBIN A1C
Hgb A1c MFr Bld: 6 % — ABNORMAL HIGH (ref 4.8–5.6)
Mean Plasma Glucose: 125.5 mg/dL

## 2019-09-11 LAB — D-DIMER, QUANTITATIVE: D-Dimer, Quant: <0.27 ug/mL-FEU (ref 0.00–0.50)

## 2019-09-11 LAB — C-REACTIVE PROTEIN: CRP: 5.2 mg/dL — ABNORMAL HIGH (ref <1.0)

## 2019-09-11 LAB — MAGNESIUM: Magnesium: 2.2 mg/dL (ref 1.7–2.4)

## 2019-09-11 LAB — GLUCOSE, CAPILLARY
Glucose-Capillary: 140 mg/dL — ABNORMAL HIGH (ref 70–99)
Glucose-Capillary: 177 mg/dL — ABNORMAL HIGH (ref 70–99)
Glucose-Capillary: 108 mg/dL — ABNORMAL HIGH (ref 70–99)
Glucose-Capillary: 238 mg/dL — ABNORMAL HIGH (ref 70–99)

## 2019-09-11 LAB — FERRITIN: Ferritin: 67 ng/mL (ref 11–307)

## 2019-09-11 MED ORDER — LISINOPRIL 20 MG PO TABS
20.0000 mg | ORAL_TABLET | Freq: Every day | ORAL | Status: DC
  Administered 2019-09-11 – 2019-09-15 (×5): 20 mg via ORAL
  Filled 2019-09-11 (×5): qty 1

## 2019-09-11 NOTE — Plan of Care (Signed)
Patient has been without issue since arriving to room. Patient remains on 2LPM via [place] and SPO2 WNL. Call light in reach, chair/bed alarm in use.   Problem: Education: Goal: Knowledge of risk factors and measures for prevention of condition will improve Outcome: Progressing   Problem: Coping: Goal: Psychosocial and spiritual needs will be supported Outcome: Progressing   Problem: Respiratory: Goal: Will maintain a patent airway Outcome: Progressing Goal: Complications related to the disease process, condition or treatment will be avoided or minimized Outcome: Progressing   Problem: Education: Goal: Knowledge of General Education information will improve Description: Including pain rating scale, medication(s)/side effects and non-pharmacologic comfort measures Outcome: Progressing   Problem: Health Behavior/Discharge Planning: Goal: Ability to manage health-related needs will improve Outcome: Progressing   Problem: Clinical Measurements: Goal: Ability to maintain clinical measurements within normal limits will improve Outcome: Progressing Goal: Will remain free from infection Outcome: Progressing Goal: Diagnostic test results will improve Outcome: Progressing Goal: Respiratory complications will improve Outcome: Progressing Goal: Cardiovascular complication will be avoided Outcome: Progressing   Problem: Activity: Goal: Risk for activity intolerance will decrease Outcome: Progressing   Problem: Nutrition: Goal: Adequate nutrition will be maintained Outcome: Progressing   Problem: Coping: Goal: Level of anxiety will decrease Outcome: Progressing   Problem: Elimination: Goal: Will not experience complications related to bowel motility Outcome: Progressing Goal: Will not experience complications related to urinary retention Outcome: Progressing   Problem: Pain Managment: Goal: General experience of comfort will improve Outcome: Progressing   Problem:  Safety: Goal: Ability to remain free from injury will improve Outcome: Progressing   Problem: Skin Integrity: Goal: Risk for impaired skin integrity will decrease Outcome: Progressing

## 2019-09-11 NOTE — Progress Notes (Signed)
PROGRESS NOTE                                                                                                                                                                                                             Patient Demographics:    Nicole Horn, is a 74 y.o. female, DOB - [birth year], GL:[id number]  Admit date - 09/10/2019   Admitting Physician Albertine Patricia, MD  Outpatient Primary MD for the patient is Rosita Fire, MD  LOS - 1   No chief complaint on file.      Brief Narrative    74 y.o. female, with past medical history of hypertension, hyperlipidemia, diabetes mellitus diet-controlled, apnea does not wear CPAP, patient was diagnosed with COVID-19 1/14, she was referred to monoclonal antibody infusion clinic, she was noted to be hypoxic 77% on room air, she was started on 4 L oxygen via nasal cannula and transferred to admission at [facility].   Subjective:    Nicole Horn today ports she is feeling better, her dyspnea has improved, still reports some cough and congestion .    Assessment  & Plan :    Active Problems:   Pneumonia due to COVID-19 virus   HTN (hypertension)   Hyperlipemia   Acute respiratory disease due to COVID-19 virus   Acute hypoxic respiratory failure due to COVID-19 pneumonia -Chest x-ray significant for multifocal opacity, hypoxic 77% on infusion center, morning she is on 2 L nasal cannula . -continue with IV remdesivir. -continue with  IV steroids -Was encouraged use incentive spirometry, flutter valve, she was encouraged to get out of bed and to prone -Continue to follow inflammatory markers, CRP trending down, D-dimer within normal limit -Antitussives -Continue with zinc and vitamin C    COVID-19 Labs  Recent Labs    09/10/19 1600 09/11/19 0448  DDIMER 0.36 <0.27  FERRITIN 66 67  CRP 6.3* 5.2*    Lab Results  Component Value Date   SARSCOV2NAA POSITIVE (A)  09/05/2019   [place] Not Detected 01/30/2019   Hypertension -On amlodipine, started to increase, will resume home quinapril, continue to hold hydrochlorothiazide .  Hyperlipidemia -Continue with statin  Hypokalemia -Repleted  With history of diabetes mellitus, started on insulin sliding scale as anticipate high readings in the setting of COVID-19 infection and steroids.   Code Status : Full  Family Communication  : None  Disposition Plan  : Home  Barriers For Discharge : Remains on 2 L nasal cannula, IV remdesivir and IV steroids  Consults  : None  Procedures  : None  DVT Prophylaxis  : Subcu Lovenox  Lab Results  Component Value Date   PLT 301 09/11/2019    Antibiotics  :    Anti-infectives (From admission, onward)   Start     Dose/Rate Route Frequency Ordered Stop   09/11/19 1000  remdesivir 100 mg in sodium chloride 0.9 % 100 mL IVPB     100 mg 200 mL/hr over 30 Minutes Intravenous Daily 09/10/19 1325 09/15/19 0959   09/10/19 1430  remdesivir 200 mg in sodium chloride 0.9% 250 mL IVPB     200 mg 580 mL/hr over 30 Minutes Intravenous Once 09/10/19 1325 09/10/19 1847        Objective:   Vitals:   09/11/19 0409 09/11/19 0712 09/11/19 0856 09/11/19 1200  BP: (!) 109/42 110/82  (!) 144/42  Pulse: 73 73    Resp: 15 (!) 21    Temp: 98.5 F (36.9 C) 98.1 F (36.7 C)  98.1 F (36.7 C)  TempSrc: Oral Oral  Oral  SpO2: 92% 99% 95%   Weight:      Height:        Wt Readings from Last 3 Encounters:  09/10/19 72.6 kg  09/05/19 72.6 kg  02/12/19 74.8 kg     Intake/Output Summary (Last 24 hours) at 09/11/2019 1310 Last data filed at 09/10/2019 1849 Gross per 24 hour  Intake 281.67 ml  Output --  Net 281.67 ml     Physical Exam  Awake Alert, Oriented X 3, No new F.N deficits, Normal affect Symmetrical Chest wall movement, Good air movement bilaterally, CTAB RRR,No Gallops,Rubs or new Murmurs, No Parasternal Heave +ve B.Sounds, Abd Soft, No  tenderness, No rebound - guarding or rigidity. No Cyanosis, Clubbing or edema, No new Rash or bruise      Data Review:    CBC Recent Labs  Lab 09/05/19 1628 09/06/19 1618 09/10/19 1600 09/11/19 0448  WBC 5.1 5.1 3.6* 2.4*  HGB 11.0* 11.0* 10.1* 9.9*  HCT 37.0 36.6 34.6* 33.3*  PLT 289 260 257 301  MCV 83.0 81.9 83.0 82.0  MCH 24.7* 24.6* 24.2* 24.4*  MCHC 29.7* 30.1 29.2* 29.7*  RDW 17.3* 17.8* 17.6* 17.0*  LYMPHSABS 0.8  --  1.0 0.5*  MONOABS 0.8  --  0.3 0.1  EOSABS 0.1  --  0.0 0.0  BASOSABS 0.0  --  0.0 0.0    Chemistries  Recent Labs  Lab 09/05/19 1628 09/06/19 1618 09/10/19 1600 09/11/19 0448  NA 139 139 139 141  K 3.0* 3.3* 3.0* 3.8  CL 103 99 101 102  CO2 26 25 28 30   GLUCOSE 88 111* 132* 156*  BUN 13 19 28* 29*  CREATININE 1.04* 1.46* 1.40* 1.21*  CALCIUM 9.0 9.0 8.4* 8.8*  MG  --   --   --  2.2  AST  --   --  57* 53*  ALT  --   --  44 43  ALKPHOS  --   --  38 41  BILITOT  --   --  0.4 0.4   ------------------------------------------------------------------------------------------------------------------ No results for input(s): CHOL, HDL, LDLCALC, TRIG, CHOLHDL, LDLDIRECT in the last 72 hours.  Lab Results  Component Value Date   HGBA1C 6.0 (H) 09/11/2019   ------------------------------------------------------------------------------------------------------------------ No results for input(s): TSH, T4TOTAL, T3FREE,  THYROIDAB in the last 72 hours.  Invalid input(s): FREET3 ------------------------------------------------------------------------------------------------------------------ Recent Labs    09/10/19 1600 09/11/19 0448  FERRITIN 66 67    Coagulation profile No results for input(s): INR, PROTIME in the last 168 hours.  Recent Labs    09/10/19 1600 09/11/19 0448  DDIMER 0.36 <0.27    Cardiac Enzymes No results for input(s): CKMB, TROPONINI, MYOGLOBIN in the last 168 hours.  Invalid input(s):  CK ------------------------------------------------------------------------------------------------------------------ No results found for: BNP  Inpatient Medications  Scheduled Meds: . vitamin C  500 mg Oral Daily  . aspirin EC  81 mg Oral Daily  . clopidogrel  75 mg Oral Daily  . dexamethasone (DECADRON) injection  6 mg Intravenous Q24H  . enoxaparin (LOVENOX) injection  40 mg Subcutaneous Q24H  . famotidine  20 mg Oral BID  . insulin aspart  0-9 Units Subcutaneous TID WC  . multivitamin with minerals  1 tablet Oral Daily  . rosuvastatin  40 mg Oral q1800  . zinc sulfate  220 mg Oral Daily   Continuous Infusions: . remdesivir 100 mg in NS 100 mL 100 mg (09/11/19 1113)   PRN Meds:.acetaminophen, benzonatate, bisacodyl, chlorpheniramine-HYDROcodone, guaiFENesin-dextromethorphan, ondansetron **OR** ondansetron (ZOFRAN) IV, polyethylene glycol  Micro Results Recent Results (from the past 240 hour(s))  SARS CORONAVIRUS 2 (TAT 6-24 HRS) Nasopharyngeal Nasopharyngeal Swab     Status: Abnormal   Collection Time: 09/05/19  6:45 PM   Specimen: Nasopharyngeal Swab  Result Value Ref Range Status   SARS Coronavirus 2 POSITIVE (A) NEGATIVE Final    Comment: RESULT CALLED TO, READ BACK BY AND VERIFIED WITH: Jani Gravel RN 15:30 09/06/19 ([email or web address]) (NOTE) SARS-CoV-2 target nucleic acids are DETECTED. The SARS-CoV-2 RNA is generally detectable in upper and lower respiratory specimens during the acute phase of infection. Positive results are indicative of the presence of SARS-CoV-2 RNA. Clinical correlation with patient history and other diagnostic information is  necessary to determine patient infection status. Positive results do not rule out bacterial infection or co-infection with other viruses.  The expected result is Negative. Fact Sheet for Patients: SugarRoll.be Fact Sheet for Healthcare Providers: https://www.woods-mathews.com/ This test is  not yet approved or cleared by the [place] FDA and  has been authorized for detection and/or diagnosis of SARS-CoV-2 by FDA under an Emergency Use Authorization (EUA). This EUA will remain  in effect (meaning this test can be used) for th e duration of the COVID-19 declaration under Section 564(b)(1) of the Act, 21 U.S.C. section 360bbb-3(b)(1), unless the authorization is terminated or revoked sooner. Performed at [facility], [place] [address] [place], [place] [address]     Radiology Reports DG Chest [place] 1 View  Result Date: 09/10/2019 CLINICAL DATA:  COVID-19 EXAM: PORTABLE CHEST 1 VIEW COMPARISON:  09/06/2019 FINDINGS: Slight interval increase in heterogeneous airspace opacity of the left lung base. The upper lungs remain well aerated. Mild cardiomegaly. IMPRESSION: Slight interval increase in heterogeneous airspace opacity of the left lung base, consistent with worsened infection or aspiration. Electronically Signed   By: Eddie Candle M.D.   On: 09/10/2019 15:38   DG Chest Portable 1 View  Result Date: 09/06/2019 CLINICAL DATA:  Shortness of breath EXAM: PORTABLE CHEST 1 VIEW COMPARISON:  09/05/2019 FINDINGS: New left lung base opacity. Low lung volumes. Stable cardiomediastinal silhouette with aortic atherosclerosis. No pneumothorax. IMPRESSION: Low lung volumes. New airspace disease at the left base concerning for a pneumonia. Electronically Signed   By: Donavan Foil M.D.   On: 09/06/2019  17:57   DG Chest Port 1 View  Result Date: 09/05/2019 CLINICAL DATA:  74 year old female with cough and fever. EXAM: PORTABLE CHEST 1 VIEW COMPARISON:  Chest radiograph dated 08/19/2009 FINDINGS: Shallow inspiration. No focal consolidation, pleural effusion, or pneumothorax. Stable cardiac silhouette. Atherosclerotic calcification of the aorta. Degenerative changes of the spine. No acute osseous pathology. IMPRESSION: No focal consolidation. Electronically Signed   By: Anner Crete  M.D.   On: 09/05/2019 19:33      Phillips Climes M.D on 09/11/2019 at 1:10 PM  Between 7am to 7pm - Pager - [phone]  After 7pm go to [email or web address] - password [facility]  [facility] -  Office  [phone]

## 2019-09-12 DIAGNOSIS — J1282 Pneumonia due to coronavirus disease 2019: Secondary | ICD-10-CM

## 2019-09-12 LAB — CBC WITH DIFFERENTIAL/PLATELET
Abs Immature Granulocytes: 0.01 10*3/uL (ref 0.00–0.07)
Basophils Absolute: 0 10*3/uL (ref 0.0–0.1)
Basophils Relative: 0 %
Eosinophils Absolute: 0 10*3/uL (ref 0.0–0.5)
Eosinophils Relative: 0 %
HCT: 31.2 % — ABNORMAL LOW (ref 36.0–46.0)
Hemoglobin: 9.6 g/dL — ABNORMAL LOW (ref 12.0–15.0)
Immature Granulocytes: 0 %
Lymphocytes Relative: 13 %
Lymphs Abs: 0.8 10*3/uL (ref 0.7–4.0)
MCH: 25 pg — ABNORMAL LOW (ref 26.0–34.0)
MCHC: 30.8 g/dL (ref 30.0–36.0)
MCV: 81.3 fL (ref 80.0–100.0)
Monocytes Absolute: 0.4 10*3/uL (ref 0.1–1.0)
Monocytes Relative: 7 %
Neutro Abs: 4.5 10*3/uL (ref 1.7–7.7)
Neutrophils Relative %: 80 %
Platelets: 324 10*3/uL (ref 150–400)
RBC: 3.84 MIL/uL — ABNORMAL LOW (ref 3.87–5.11)
RDW: 17.2 % — ABNORMAL HIGH (ref 11.5–15.5)
WBC: 5.7 10*3/uL (ref 4.0–10.5)
nRBC: 0.4 % — ABNORMAL HIGH (ref 0.0–0.2)

## 2019-09-12 LAB — COMPREHENSIVE METABOLIC PANEL
ALT: 36 U/L (ref 0–44)
AST: 38 U/L (ref 15–41)
Albumin: 2.9 g/dL — ABNORMAL LOW (ref 3.5–5.0)
Alkaline Phosphatase: 38 U/L (ref 38–126)
Anion gap: 7 (ref 5–15)
BUN: 34 mg/dL — ABNORMAL HIGH (ref 8–23)
CO2: 28 mmol/L (ref 22–32)
Calcium: 9 mg/dL (ref 8.9–10.3)
Chloride: 106 mmol/L (ref 98–111)
Creatinine, Ser: 1.07 mg/dL — ABNORMAL HIGH (ref 0.44–1.00)
GFR calc Af Amer: >60 mL/min (ref >60)
GFR calc non Af Amer: 52 mL/min — ABNORMAL LOW (ref >60)
Glucose, Bld: 166 mg/dL — ABNORMAL HIGH (ref 70–99)
Potassium: 4.4 mmol/L (ref 3.5–5.1)
Sodium: 141 mmol/L (ref 135–145)
Total Bilirubin: 0.7 mg/dL (ref 0.3–1.2)
Total Protein: 6.2 g/dL — ABNORMAL LOW (ref 6.5–8.1)

## 2019-09-12 LAB — C-REACTIVE PROTEIN: CRP: 2.2 mg/dL — ABNORMAL HIGH (ref <1.0)

## 2019-09-12 LAB — FERRITIN: Ferritin: 63 ng/mL (ref 11–307)

## 2019-09-12 LAB — D-DIMER, QUANTITATIVE: D-Dimer, Quant: <0.27 ug/mL-FEU (ref 0.00–0.50)

## 2019-09-12 LAB — GLUCOSE, CAPILLARY
Glucose-Capillary: 144 mg/dL — ABNORMAL HIGH (ref 70–99)
Glucose-Capillary: 219 mg/dL — ABNORMAL HIGH (ref 70–99)
Glucose-Capillary: 96 mg/dL (ref 70–99)
Glucose-Capillary: 183 mg/dL — ABNORMAL HIGH (ref 70–99)

## 2019-09-12 LAB — MAGNESIUM: Magnesium: 2 mg/dL (ref 1.7–2.4)

## 2019-09-12 NOTE — Progress Notes (Signed)
Ambulation Note  Saturation Pre: 91% on 3L  Ambulation Distance: 300 ft  Saturation During Ambulation: 86-92% on 3L  Notes: Pt walked independently. Tolerated well. Pt returned to bed side with call bell within reach, sats were 94% on 2L.   Philis Kendall, MS, ACSM CEP 12:23 PM 09/12/2019

## 2019-09-12 NOTE — Plan of Care (Signed)
Patient remains Aox4, on 2L , sats are wnl, dyspnea with exertion is improving per patient, still has hacking dry cough, has three days of Remdesivir tx remaining, continue poc Problem: Education: Goal: Knowledge of risk factors and measures for prevention of condition will improve Outcome: Progressing   Problem: Coping: Goal: Psychosocial and spiritual needs will be supported Outcome: Progressing   Problem: Respiratory: Goal: Will maintain a patent airway Outcome: Progressing Goal: Complications related to the disease process, condition or treatment will be avoided or minimized Outcome: Progressing   Problem: Education: Goal: Knowledge of General Education information will improve Description: Including pain rating scale, medication(s)/side effects and non-pharmacologic comfort measures Outcome: Progressing   Problem: Health Behavior/Discharge Planning: Goal: Ability to manage health-related needs will improve Outcome: Progressing   Problem: Clinical Measurements: Goal: Ability to maintain clinical measurements within normal limits will improve Outcome: Progressing Goal: Will remain free from infection Outcome: Progressing Goal: Diagnostic test results will improve Outcome: Progressing Goal: Respiratory complications will improve Outcome: Progressing Goal: Cardiovascular complication will be avoided Outcome: Progressing   Problem: Activity: Goal: Risk for activity intolerance will decrease Outcome: Progressing   Problem: Nutrition: Goal: Adequate nutrition will be maintained Outcome: Progressing   Problem: Coping: Goal: Level of anxiety will decrease Outcome: Progressing   Problem: Elimination: Goal: Will not experience complications related to bowel motility Outcome: Progressing Goal: Will not experience complications related to urinary retention Outcome: Progressing   Problem: Pain Managment: Goal: General experience of comfort will improve Outcome:  Progressing   Problem: Safety: Goal: Ability to remain free from injury will improve Outcome: Progressing   Problem: Skin Integrity: Goal: Risk for impaired skin integrity will decrease Outcome: Progressing

## 2019-09-12 NOTE — Progress Notes (Signed)
SATURATION QUALIFICATIONS: (This note is used to comply with regulatory documentation for home oxygen)  Patient Saturations on Room Air at Rest = 88-89%  Patient Saturations on Room Air while Ambulating = 81%  Patient Saturations on 3 Liters of oxygen while Ambulating = 86-92%  Please briefly explain why patient needs home oxygen: Pt needs supplemental oxygen to keep saturations at 88% and above while ambulating.

## 2019-09-12 NOTE — Progress Notes (Signed)
PROGRESS NOTE  Nicole Horn [id number] DOB: [birth year] DOA: 09/10/2019 PCP: Patient, No Pcp Per   LOS: 2 days   Brief Narrative / Interim history: 74 year old female with HTN, HLD, DM 2, diagnosed with COVID-19 on 1/14 and referred to monoclonal antibody infusion clinic, over there she was noted to be hypoxic to 77% on room air requiring 4 L nasal cannula and transferred to [facility]  Subjective / 24h Interval events: She is feeling a lot better today, remains short of breath with ambulation but she feels overall well at rest  Assessment & Plan:  Principal Problem Acute Hypoxic Respiratory Failure due to Covid-19 Viral Illness -Remains hypoxic requiring 2-3 L nasal cannula, she was placed on IV steroids and remdesivir.  Continue, she is scheduled to finish remdesivir on 1/22 -Continue to follow her inflammatory markers, CRP improving -Wean off to room air as tolerated, continue supportive treatment with antitussives, zinc, vitamin C, I-S, flutter valve   COVID-19 Labs  Recent Labs    09/10/19 1600 09/11/19 0448 09/12/19 0625  DDIMER 0.36 <0.27 <0.27  FERRITIN 66 67 63  CRP 6.3* 5.2* 2.2*    Lab Results  Component Value Date   SARSCOV2NAA POSITIVE (A) 09/05/2019   [place] Not Detected 01/30/2019   Active Problems Essential hypertension -Continue lisinopril, blood pressure acceptable  Hyperlipidemia -Continue statin  Hypokalemia -Repleted  History of DM 2 -Started on sliding scale  CBG (last 3)  Recent Labs    09/11/19 2020 09/12/19 0824 09/12/19 1133  GLUCAP 238* 144* 219*     Scheduled Meds: . vitamin C  500 mg Oral Daily  . aspirin EC  81 mg Oral Daily  . clopidogrel  75 mg Oral Daily  . dexamethasone (DECADRON) injection  6 mg Intravenous Q24H  . enoxaparin (LOVENOX) injection  40 mg Subcutaneous Q24H  . famotidine  20 mg Oral BID  . insulin aspart  0-9 Units Subcutaneous TID WC  . lisinopril  20 mg Oral Daily  . multivitamin with  minerals  1 tablet Oral Daily  . rosuvastatin  40 mg Oral q1800  . zinc sulfate  220 mg Oral Daily   Continuous Infusions: . remdesivir 100 mg in NS 100 mL 100 mg (09/12/19 1034)   PRN Meds:.acetaminophen, benzonatate, bisacodyl, chlorpheniramine-HYDROcodone, guaiFENesin-dextromethorphan, ondansetron **OR** ondansetron (ZOFRAN) IV, polyethylene glycol  DVT prophylaxis: Lovenox Code Status: Full code Family Communication: Discussed with patient Patient admitted from: Home Anticipated d/c place: Home Barriers to d/c: Persistent hypoxia, hopefully home on Friday when she finishes remdesivir and if she will be on room air at that time  Consultants:  None   Procedures:  None   Microbiology: None   Antimicrobials: None    Objective: Vitals:   09/11/19 1400 09/11/19 2044 09/12/19 0323 09/12/19 0856  BP: (!) 150/66 (!) 138/55 (!) 123/94 (!) 118/55  Pulse: 75 68 66 74  Resp: 16 14 13 18   Temp: 98.3 F (36.8 C) 98 F (36.7 C) 98.2 F (36.8 C) (!) 97.5 F (36.4 C)  TempSrc: Oral Oral  Axillary  SpO2: 93% 94% 95% (!) 89%  Weight:      Height:       No intake or output data in the 24 hours ending 09/12/19 1549 Filed Weights   09/10/19 1647  Weight: 72.6 kg    Examination:  Constitutional: NAD Eyes: no scleral icterus ENMT: Mucous membranes are moist.  Neck: normal, supple Respiratory: Faint bibasilar rhonchi, no wheezing, no crackles, Cardiovascular: Regular rate and rhythm, no murmurs /  rubs / gallops. No LE edema. Abdomen: non distended, no tenderness. Bowel sounds positive.  Musculoskeletal: no clubbing / cyanosis.  Skin: no rashes Neurologic: No focal deficits, ambulatory  Data Reviewed: I have independently reviewed following labs and imaging studies   CBC: Recent Labs  Lab 09/05/19 1628 09/06/19 1618 09/10/19 1600 09/11/19 0448 09/12/19 0625  WBC 5.1 5.1 3.6* 2.4* 5.7  NEUTROABS 3.5  --  2.3 1.7 4.5  HGB 11.0* 11.0* 10.1* 9.9* 9.6*  HCT 37.0 36.6  34.6* 33.3* 31.2*  MCV 83.0 81.9 83.0 82.0 81.3  PLT 289 260 257 301 0000000   Basic Metabolic Panel: Recent Labs  Lab 09/05/19 1628 09/06/19 1618 09/10/19 1600 09/11/19 0448 09/12/19 0625  NA 139 139 139 141 141  K 3.0* 3.3* 3.0* 3.8 4.4  CL 103 99 101 102 106  CO2 26 25 28 30 28   GLUCOSE 88 111* 132* 156* 166*  BUN 13 19 28* 29* 34*  CREATININE 1.04* 1.46* 1.40* 1.21* 1.07*  CALCIUM 9.0 9.0 8.4* 8.8* 9.0  MG  --   --   --  2.2 2.0   GFR: CrCl cannot be calculated (Unknown ideal weight.). Liver Function Tests: Recent Labs  Lab 09/10/19 1600 09/11/19 0448 09/12/19 0625  AST 57* 53* 38  ALT 44 43 36  ALKPHOS 38 41 38  BILITOT 0.4 0.4 0.7  PROT 6.8 6.8 6.2*  ALBUMIN 3.2* 3.0* 2.9*   No results for input(s): LIPASE, AMYLASE in the last 168 hours. No results for input(s): AMMONIA in the last 168 hours. Coagulation Profile: No results for input(s): INR, PROTIME in the last 168 hours. Cardiac Enzymes: No results for input(s): CKTOTAL, CKMB, CKMBINDEX, TROPONINI in the last 168 hours. BNP (last 3 results) No results for input(s): PROBNP in the last 8760 hours. HbA1C: Recent Labs    09/11/19 0448  HGBA1C 6.0*   CBG: Recent Labs  Lab 09/11/19 1147 09/11/19 1625 09/11/19 2020 09/12/19 0824 09/12/19 1133  GLUCAP 177* 108* 238* 144* 219*   Lipid Profile: No results for input(s): CHOL, HDL, LDLCALC, TRIG, CHOLHDL, LDLDIRECT in the last 72 hours. Thyroid Function Tests: No results for input(s): TSH, T4TOTAL, FREET4, T3FREE, THYROIDAB in the last 72 hours. Anemia Panel: Recent Labs    09/11/19 0448 09/12/19 0625  FERRITIN 67 63   Urine analysis:    Component Value Date/Time   COLORURINE YELLOW 02/28/2015 1011   APPEARANCEUR CLEAR 02/28/2015 1011   LABSPEC 1.025 02/28/2015 1011   PHURINE 5.5 02/28/2015 1011   GLUCOSEU NEGATIVE 02/28/2015 1011   HGBUR NEGATIVE 02/28/2015 1011   BILIRUBINUR NEGATIVE 02/28/2015 1011   KETONESUR NEGATIVE 02/28/2015 1011    PROTEINUR NEGATIVE 02/28/2015 1011   UROBILINOGEN 0.2 02/28/2015 1011   NITRITE NEGATIVE 02/28/2015 1011   LEUKOCYTESUR NEGATIVE 02/28/2015 1011   Sepsis Labs: Invalid input(s): PROCALCITONIN, LACTICIDVEN  Recent Results (from the past 240 hour(s))  SARS CORONAVIRUS 2 (TAT 6-24 HRS) Nasopharyngeal Nasopharyngeal Swab     Status: Abnormal   Collection Time: 09/05/19  6:45 PM   Specimen: Nasopharyngeal Swab  Result Value Ref Range Status   SARS Coronavirus 2 POSITIVE (A) NEGATIVE Final    Comment: RESULT CALLED TO, READ BACK BY AND VERIFIED WITH: Jani Gravel RN 15:30 09/06/19 ([email or web address]) (NOTE) SARS-CoV-2 target nucleic acids are DETECTED. The SARS-CoV-2 RNA is generally detectable in upper and lower respiratory specimens during the acute phase of infection. Positive results are indicative of the presence of SARS-CoV-2 RNA. Clinical correlation with patient history and other diagnostic  information is  necessary to determine patient infection status. Positive results do not rule out bacterial infection or co-infection with other viruses.  The expected result is Negative. Fact Sheet for Patients: SugarRoll.be Fact Sheet for Healthcare Providers: https://www.woods-mathews.com/ This test is not yet approved or cleared by the [place] FDA and  has been authorized for detection and/or diagnosis of SARS-CoV-2 by FDA under an Emergency Use Authorization (EUA). This EUA will remain  in effect (meaning this test can be used) for th e duration of the COVID-19 declaration under Section 564(b)(1) of the Act, 21 U.S.C. section 360bbb-3(b)(1), unless the authorization is terminated or revoked sooner. Performed at [facility], [place] [address], [place], [place] [address]       Radiology Studies: No results found.  Marzetta Board, MD, PhD [facility]  Contact via  [email or web address]  [place]illian P: [phone] F: [phone]

## 2019-09-13 LAB — CBC WITH DIFFERENTIAL/PLATELET
Abs Immature Granulocytes: 0.05 10*3/uL (ref 0.00–0.07)
Basophils Absolute: 0 10*3/uL (ref 0.0–0.1)
Basophils Relative: 0 %
Eosinophils Absolute: 0 10*3/uL (ref 0.0–0.5)
Eosinophils Relative: 0 %
HCT: 31.4 % — ABNORMAL LOW (ref 36.0–46.0)
Hemoglobin: 9.5 g/dL — ABNORMAL LOW (ref 12.0–15.0)
Immature Granulocytes: 1 %
Lymphocytes Relative: 12 %
Lymphs Abs: 0.8 10*3/uL (ref 0.7–4.0)
MCH: 24.2 pg — ABNORMAL LOW (ref 26.0–34.0)
MCHC: 30.3 g/dL (ref 30.0–36.0)
MCV: 79.9 fL — ABNORMAL LOW (ref 80.0–100.0)
Monocytes Absolute: 0.5 10*3/uL (ref 0.1–1.0)
Monocytes Relative: 7 %
Neutro Abs: 5.2 10*3/uL (ref 1.7–7.7)
Neutrophils Relative %: 80 %
Platelets: 371 10*3/uL (ref 150–400)
RBC: 3.93 MIL/uL (ref 3.87–5.11)
RDW: 16.9 % — ABNORMAL HIGH (ref 11.5–15.5)
WBC: 6.5 10*3/uL (ref 4.0–10.5)
nRBC: 0 % (ref 0.0–0.2)

## 2019-09-13 LAB — COMPREHENSIVE METABOLIC PANEL
ALT: 36 U/L (ref 0–44)
AST: 32 U/L (ref 15–41)
Albumin: 2.9 g/dL — ABNORMAL LOW (ref 3.5–5.0)
Alkaline Phosphatase: 43 U/L (ref 38–126)
Anion gap: 10 (ref 5–15)
BUN: 34 mg/dL — ABNORMAL HIGH (ref 8–23)
CO2: 29 mmol/L (ref 22–32)
Calcium: 8.9 mg/dL (ref 8.9–10.3)
Chloride: 103 mmol/L (ref 98–111)
Creatinine, Ser: 0.93 mg/dL (ref 0.44–1.00)
GFR calc Af Amer: >60 mL/min (ref >60)
GFR calc non Af Amer: >60 mL/min (ref >60)
Glucose, Bld: 183 mg/dL — ABNORMAL HIGH (ref 70–99)
Potassium: 4.3 mmol/L (ref 3.5–5.1)
Sodium: 142 mmol/L (ref 135–145)
Total Bilirubin: 0.8 mg/dL (ref 0.3–1.2)
Total Protein: 6.4 g/dL — ABNORMAL LOW (ref 6.5–8.1)

## 2019-09-13 LAB — C-REACTIVE PROTEIN: CRP: 1.3 mg/dL — ABNORMAL HIGH (ref <1.0)

## 2019-09-13 LAB — FERRITIN: Ferritin: 51 ng/mL (ref 11–307)

## 2019-09-13 LAB — GLUCOSE, CAPILLARY
Glucose-Capillary: 152 mg/dL — ABNORMAL HIGH (ref 70–99)
Glucose-Capillary: 170 mg/dL — ABNORMAL HIGH (ref 70–99)
Glucose-Capillary: 163 mg/dL — ABNORMAL HIGH (ref 70–99)
Glucose-Capillary: 179 mg/dL — ABNORMAL HIGH (ref 70–99)

## 2019-09-13 LAB — MAGNESIUM: Magnesium: 2.1 mg/dL (ref 1.7–2.4)

## 2019-09-13 LAB — D-DIMER, QUANTITATIVE: D-Dimer, Quant: <0.27 ug/mL-FEU (ref 0.00–0.50)

## 2019-09-13 MED ORDER — FUROSEMIDE 10 MG/ML IJ SOLN
20.0000 mg | Freq: Once | INTRAMUSCULAR | Status: AC
  Administered 2019-09-13: 20 mg via INTRAVENOUS
  Filled 2019-09-13: qty 2

## 2019-09-13 NOTE — Plan of Care (Signed)
Patient remains on 2L [place], sats in lower 90's, attempting to wean down but unable to maintain, only complaint was pain in neck relieved with prn, has two doses on Remdesivir remaining, continue Plan of care. Problem: Education: Goal: Knowledge of risk factors and measures for prevention of condition will improve Outcome: Progressing   Problem: Respiratory: Goal: Will maintain a patent airway Outcome: Progressing Goal: Complications related to the disease process, condition or treatment will be avoided or minimized Outcome: Progressing   Problem: Education: Goal: Knowledge of General Education information will improve Description: Including pain rating scale, medication(s)/side effects and non-pharmacologic comfort measures Outcome: Progressing   Problem: Health Behavior/Discharge Planning: Goal: Ability to manage health-related needs will improve Outcome: Progressing   Problem: Clinical Measurements: Goal: Ability to maintain clinical measurements within normal limits will improve Outcome: Progressing Goal: Will remain free from infection Outcome: Progressing Goal: Diagnostic test results will improve Outcome: Progressing Goal: Respiratory complications will improve Outcome: Progressing Goal: Cardiovascular complication will be avoided Outcome: Progressing   Problem: Activity: Goal: Risk for activity intolerance will decrease Outcome: Progressing   Problem: Pain Managment: Goal: General experience of comfort will improve Outcome: Progressing   Problem: Safety: Goal: Ability to remain free from injury will improve Outcome: Progressing   Problem: Coping: Goal: Psychosocial and spiritual needs will be supported Outcome: Completed/Met   Problem: Nutrition: Goal: Adequate nutrition will be maintained Outcome: Completed/Met   Problem: Coping: Goal: Level of anxiety will decrease Outcome: Completed/Met   Problem: Elimination: Goal: Will not experience complications  related to bowel motility Outcome: Completed/Met Goal: Will not experience complications related to urinary retention Outcome: Completed/Met   Problem: Skin Integrity: Goal: Risk for impaired skin integrity will decrease Outcome: Completed/Met

## 2019-09-13 NOTE — Progress Notes (Signed)
PROGRESS NOTE  Nicole Horn [id number] DOB: [birth year] DOA: 09/10/2019 PCP: Patient, No Pcp Per   LOS: 3 days   Brief Narrative / Interim history: 74 year old female with HTN, HLD, DM 2, diagnosed with COVID-19 on 1/14 and referred to monoclonal antibody infusion clinic, over there she was noted to be hypoxic to 77% on room air requiring 4 L nasal cannula and transferred to [facility]  Subjective / 24h Interval events: Feels improved, stronger.  Denies any shortness of breath.  No abdominal pain, no nausea or vomiting. Assessment & Plan:  Principal Problem Acute Hypoxic Respiratory Failure due to Covid-19 Viral Illness -Remains hypoxic to 2 L nasal cannula, she was placed on IV steroids and remdesivir.  Continue, she is scheduled to finish remdesivir on 1/22 -Continue to follow her inflammatory markers, CRP improving -Wean off to room air as tolerated, continue supportive treatment with antitussives, zinc, vitamin C, I-S, flutter valve -We will give Lasix x1 today, wean off to room air as tolerated   COVID-19 Labs  Recent Labs    09/11/19 0448 09/12/19 0625 09/13/19 0620  DDIMER <0.27 <0.27 <0.27  FERRITIN 67 63 51  CRP 5.2* 2.2* 1.3*    Lab Results  Component Value Date   SARSCOV2NAA POSITIVE (A) 09/05/2019   [place] Not Detected 01/30/2019   Active Problems Essential hypertension -Continue lisinopril, blood pressure acceptable  Hyperlipidemia -Continue statin  Hypokalemia -Repleted  History of DM 2 -Started on sliding scale, CBGs acceptable  CBG (last 3)  Recent Labs    09/12/19 1609 09/12/19 2156 09/13/19 0734  GLUCAP 96 183* 152*     Scheduled Meds: . vitamin C  500 mg Oral Daily  . aspirin EC  81 mg Oral Daily  . clopidogrel  75 mg Oral Daily  . dexamethasone (DECADRON) injection  6 mg Intravenous Q24H  . enoxaparin (LOVENOX) injection  40 mg Subcutaneous Q24H  . famotidine  20 mg Oral BID  . insulin aspart  0-9 Units Subcutaneous  TID WC  . lisinopril  20 mg Oral Daily  . multivitamin with minerals  1 tablet Oral Daily  . rosuvastatin  40 mg Oral q1800  . zinc sulfate  220 mg Oral Daily   Continuous Infusions: . remdesivir 100 mg in NS 100 mL 100 mg (09/13/19 0946)   PRN Meds:.acetaminophen, benzonatate, bisacodyl, chlorpheniramine-HYDROcodone, guaiFENesin-dextromethorphan, ondansetron **OR** ondansetron (ZOFRAN) IV, polyethylene glycol  DVT prophylaxis: Lovenox Code Status: Full code Family Communication: Discussed with patient Patient admitted from: Home Anticipated d/c place: Home Barriers to d/c: Persistent hypoxia, hopefully home tomorrow when she finishes remdesivir if she is stable on room air  Consultants:  None   Procedures:  None   Microbiology: None   Antimicrobials: None    Objective: Vitals:   09/12/19 1617 09/12/19 2000 09/13/19 0346 09/13/19 0800  BP: (!) 126/55 (!) 132/56 (!) 141/50 (!) 120/58  Pulse: 60 68 81   Resp: 18 16 14 16   Temp: 97.8 F (36.6 C) 98.2 F (36.8 C) 98.3 F (36.8 C) (!) 97.2 F (36.2 C)  TempSrc: Axillary Oral Oral Axillary  SpO2: 92% 93% 92% 92%  Weight:      Height:        Intake/Output Summary (Last 24 hours) at 09/13/2019 1121 Last data filed at 09/12/2019 1726 Gross per 24 hour  Intake 500 ml  Output --  Net 500 ml   Filed Weights   09/10/19 1647  Weight: 72.6 kg    Examination:  Constitutional: No distress, sitting at  the edge of the bed Eyes: No scleral icterus ENMT: Moist mucous membranes Neck: normal, supple Respiratory: Diminished at the bases but overall clear, no wheezing, no crackles Cardiovascular: Regular rate and rhythm, no murmurs, no edema Abdomen: Soft, nontender, nondistended, bowel sounds positive Musculoskeletal: no clubbing / cyanosis.  Skin: No rashes seen Neurologic: No focal deficits, ambulatory  Data Reviewed: I have independently reviewed following labs and imaging studies   CBC: Recent Labs  Lab  10/04/19 1618 09/10/19 1600 09/11/19 0448 09/12/19 0625 09/13/19 0620  WBC 5.1 3.6* 2.4* 5.7 6.5  NEUTROABS  --  2.3 1.7 4.5 5.2  HGB 11.0* 10.1* 9.9* 9.6* 9.5*  HCT 36.6 34.6* 33.3* 31.2* 31.4*  MCV 81.9 83.0 82.0 81.3 79.9*  PLT 260 257 301 324 [id number]   Basic Metabolic Panel: Recent Labs  Lab 2019/10/04 1618 09/10/19 1600 09/11/19 0448 09/12/19 0625 09/13/19 0620  NA 139 139 141 141 142  K 3.3* 3.0* 3.8 4.4 4.3  CL 99 101 102 106 103  CO2 25 28 30 28 29   GLUCOSE 111* 132* 156* 166* 183*  BUN 19 28* 29* 34* 34*  CREATININE 1.46* 1.40* 1.21* 1.07* 0.93  CALCIUM 9.0 8.4* 8.8* 9.0 8.9  MG  --   --  2.2 2.0 2.1   GFR: CrCl cannot be calculated (Unknown ideal weight.). Liver Function Tests: Recent Labs  Lab 09/10/19 1600 09/11/19 0448 09/12/19 0625 09/13/19 0620  AST 57* 53* 38 32  ALT 44 43 36 36  ALKPHOS 38 41 38 43  BILITOT 0.4 0.4 0.7 0.8  PROT 6.8 6.8 6.2* 6.4*  ALBUMIN 3.2* 3.0* 2.9* 2.9*   No results for input(s): LIPASE, AMYLASE in the last 168 hours. No results for input(s): AMMONIA in the last 168 hours. Coagulation Profile: No results for input(s): INR, PROTIME in the last 168 hours. Cardiac Enzymes: No results for input(s): CKTOTAL, CKMB, CKMBINDEX, TROPONINI in the last 168 hours. BNP (last 3 results) No results for input(s): PROBNP in the last 8760 hours. HbA1C: Recent Labs    09/11/19 0448  HGBA1C 6.0*   CBG: Recent Labs  Lab 09/12/19 0824 09/12/19 1133 09/12/19 1609 09/12/19 2156 09/13/19 0734  GLUCAP 144* 219* 96 183* 152*   Lipid Profile: No results for input(s): CHOL, HDL, LDLCALC, TRIG, CHOLHDL, LDLDIRECT in the last 72 hours. Thyroid Function Tests: No results for input(s): TSH, T4TOTAL, FREET4, T3FREE, THYROIDAB in the last 72 hours. Anemia Panel: Recent Labs    09/12/19 0625 09/13/19 0620  FERRITIN 63 51   Urine analysis:    Component Value Date/Time   COLORURINE YELLOW 02/28/2015 1011   APPEARANCEUR CLEAR 02/28/2015  1011   LABSPEC 1.025 02/28/2015 1011   PHURINE 5.5 02/28/2015 1011   GLUCOSEU NEGATIVE 02/28/2015 1011   HGBUR NEGATIVE 02/28/2015 1011   BILIRUBINUR NEGATIVE 02/28/2015 1011   KETONESUR NEGATIVE 02/28/2015 1011   PROTEINUR NEGATIVE 02/28/2015 1011   UROBILINOGEN 0.2 02/28/2015 1011   NITRITE NEGATIVE 02/28/2015 1011   LEUKOCYTESUR NEGATIVE 02/28/2015 1011   Sepsis Labs: Invalid input(s): PROCALCITONIN, LACTICIDVEN  Recent Results (from the past 240 hour(s))  SARS CORONAVIRUS 2 (TAT 6-24 HRS) Nasopharyngeal Nasopharyngeal Swab     Status: Abnormal   Collection Time: 09/05/19  6:45 PM   Specimen: Nasopharyngeal Swab  Result Value Ref Range Status   SARS Coronavirus 2 POSITIVE (A) NEGATIVE Final    Comment: RESULT CALLED TO, READ BACK BY AND VERIFIED WITH: Jani Gravel RN 15:30 04-Oct-2019 ([email or web address]) (NOTE) SARS-CoV-2 target nucleic acids are DETECTED.  The SARS-CoV-2 RNA is generally detectable in upper and lower respiratory specimens during the acute phase of infection. Positive results are indicative of the presence of SARS-CoV-2 RNA. Clinical correlation with patient history and other diagnostic information is  necessary to determine patient infection status. Positive results do not rule out bacterial infection or co-infection with other viruses.  The expected result is Negative. Fact Sheet for Patients: SugarRoll.be Fact Sheet for Healthcare Providers: https://www.woods-mathews.com/ This test is not yet approved or cleared by the [place] FDA and  has been authorized for detection and/or diagnosis of SARS-CoV-2 by FDA under an Emergency Use Authorization (EUA). This EUA will remain  in effect (meaning this test can be used) for th e duration of the COVID-19 declaration under Section 564(b)(1) of the Act, 21 U.S.C. section 360bbb-3(b)(1), unless the authorization is terminated or revoked sooner. Performed at [facility],  [place] [address], [place], [place] [address]       Radiology Studies: No results found.  Marzetta Board, MD, PhD [facility]  Contact via  [email or web address]  [place] P: [phone] F: [phone]

## 2019-09-14 ENCOUNTER — Inpatient Hospital Stay (HOSPITAL_COMMUNITY): Payer: Medicare (Managed Care)

## 2019-09-14 LAB — GLUCOSE, CAPILLARY
Glucose-Capillary: 161 mg/dL — ABNORMAL HIGH (ref 70–99)
Glucose-Capillary: 146 mg/dL — ABNORMAL HIGH (ref 70–99)
Glucose-Capillary: 135 mg/dL — ABNORMAL HIGH (ref 70–99)

## 2019-09-14 LAB — COMPREHENSIVE METABOLIC PANEL
ALT: 36 U/L (ref 0–44)
AST: 31 U/L (ref 15–41)
Albumin: 2.9 g/dL — ABNORMAL LOW (ref 3.5–5.0)
Alkaline Phosphatase: 43 U/L (ref 38–126)
Anion gap: 9 (ref 5–15)
BUN: 33 mg/dL — ABNORMAL HIGH (ref 8–23)
CO2: 30 mmol/L (ref 22–32)
Calcium: 8.9 mg/dL (ref 8.9–10.3)
Chloride: 104 mmol/L (ref 98–111)
Creatinine, Ser: 1.09 mg/dL — ABNORMAL HIGH (ref 0.44–1.00)
GFR calc Af Amer: 59 mL/min — ABNORMAL LOW (ref >60)
GFR calc non Af Amer: 51 mL/min — ABNORMAL LOW (ref >60)
Glucose, Bld: 198 mg/dL — ABNORMAL HIGH (ref 70–99)
Potassium: 4.5 mmol/L (ref 3.5–5.1)
Sodium: 143 mmol/L (ref 135–145)
Total Bilirubin: 0.4 mg/dL (ref 0.3–1.2)
Total Protein: 6.2 g/dL — ABNORMAL LOW (ref 6.5–8.1)

## 2019-09-14 LAB — CBC WITH DIFFERENTIAL/PLATELET
Abs Immature Granulocytes: 0.1 10*3/uL — ABNORMAL HIGH (ref 0.00–0.07)
Basophils Absolute: 0 10*3/uL (ref 0.0–0.1)
Basophils Relative: 0 %
Eosinophils Absolute: 0 10*3/uL (ref 0.0–0.5)
Eosinophils Relative: 0 %
HCT: 32.8 % — ABNORMAL LOW (ref 36.0–46.0)
Hemoglobin: 9.9 g/dL — ABNORMAL LOW (ref 12.0–15.0)
Immature Granulocytes: 1 %
Lymphocytes Relative: 14 %
Lymphs Abs: 1 10*3/uL (ref 0.7–4.0)
MCH: 24.2 pg — ABNORMAL LOW (ref 26.0–34.0)
MCHC: 30.2 g/dL (ref 30.0–36.0)
MCV: 80.2 fL (ref 80.0–100.0)
Monocytes Absolute: 0.6 10*3/uL (ref 0.1–1.0)
Monocytes Relative: 8 %
Neutro Abs: 5.4 10*3/uL (ref 1.7–7.7)
Neutrophils Relative %: 77 %
Platelets: 400 10*3/uL (ref 150–400)
RBC: 4.09 MIL/uL (ref 3.87–5.11)
RDW: 17.1 % — ABNORMAL HIGH (ref 11.5–15.5)
WBC: 7 10*3/uL (ref 4.0–10.5)
nRBC: 0.4 % — ABNORMAL HIGH (ref 0.0–0.2)

## 2019-09-14 LAB — D-DIMER, QUANTITATIVE: D-Dimer, Quant: 0.33 ug/mL-FEU (ref 0.00–0.50)

## 2019-09-14 LAB — MAGNESIUM: Magnesium: 2.1 mg/dL (ref 1.7–2.4)

## 2019-09-14 LAB — FERRITIN: Ferritin: 40 ng/mL (ref 11–307)

## 2019-09-14 LAB — C-REACTIVE PROTEIN: CRP: 0.9 mg/dL (ref <1.0)

## 2019-09-14 MED ORDER — ALBUTEROL SULFATE HFA 108 (90 BASE) MCG/ACT IN AERS
1.0000 | INHALATION_SPRAY | Freq: Four times a day (QID) | RESPIRATORY_TRACT | Status: DC
  Administered 2019-09-14 – 2019-09-15 (×5): 2 via RESPIRATORY_TRACT
  Filled 2019-09-14: qty 6.7

## 2019-09-14 MED ORDER — FUROSEMIDE 10 MG/ML IJ SOLN
40.0000 mg | Freq: Once | INTRAMUSCULAR | Status: AC
  Administered 2019-09-14: 11:00:00 40 mg via INTRAVENOUS
  Filled 2019-09-14: qty 4

## 2019-09-14 NOTE — Plan of Care (Signed)
Patient remains alert and oriented x4, weaned patient to room air during shift, ambulation to bathroom caused sats to drop to a low of 78%, patient denied any sob, placed back on 2L to recover sats, has 1x dose of remdesivir remaining, continue plan of care Problem: Education: Goal: Knowledge of risk factors and measures for prevention of condition will improve Outcome: Progressing   Problem: Respiratory: Goal: Will maintain a patent airway Outcome: Progressing Goal: Complications related to the disease process, condition or treatment will be avoided or minimized Outcome: Progressing   Problem: Education: Goal: Knowledge of General Education information will improve Description: Including pain rating scale, medication(s)/side effects and non-pharmacologic comfort measures Outcome: Progressing   Problem: Health Behavior/Discharge Planning: Goal: Ability to manage health-related needs will improve Outcome: Progressing   Problem: Clinical Measurements: Goal: Ability to maintain clinical measurements within normal limits will improve Outcome: Progressing Goal: Will remain free from infection Outcome: Progressing Goal: Diagnostic test results will improve Outcome: Progressing Goal: Respiratory complications will improve Outcome: Progressing Goal: Cardiovascular complication will be avoided Outcome: Progressing   Problem: Activity: Goal: Risk for activity intolerance will decrease Outcome: Progressing   Problem: Pain Managment: Goal: General experience of comfort will improve Outcome: Progressing   Problem: Safety: Goal: Ability to remain free from injury will improve Outcome: Progressing

## 2019-09-14 NOTE — Progress Notes (Signed)
PROGRESS NOTE  Nicole Horn [id number] DOB: 19-Aug-1946 DOA: 09/10/2019 PCP: Patient, No Pcp Per   LOS: 4 days   Brief Narrative / Interim history: 74 year old female with HTN, HLD, DM 2, diagnosed with COVID-19 on 1/14 and referred to monoclonal antibody infusion clinic, over there she was noted to be hypoxic to 77% on room air requiring 4 L nasal cannula and transferred to [facility]  Subjective / 24h Interval events: Feeling better however still reports shortness of breath and weakness.  Was able to ambulate from the bed to the bathroom this morning on room air but felt quite tired.  Still remains hypoxic and unable to tolerate room air requiring 2 L  Assessment & Plan:  Principal Problem Acute Hypoxic Respiratory Failure due to Covid-19 Viral Illness -Remains hypoxic requiring 2 L, continue IV steroids, she is scheduled to finish remdesivir today 1/22 -Continue to follow her inflammatory markers, improving today, CRP and D-dimer are normal -Wean off to room air as tolerated, continue supportive treatment with antitussives, zinc, vitamin C, I-S, flutter valve -Given persistent hypoxia will repeat a chest x-ray, schedule albuterol and give Lasix x1   COVID-19 Labs  Recent Labs    09/12/19 0625 09/13/19 0620 09/14/19 0507  DDIMER <0.27 <0.27 0.33  FERRITIN 63 51 40  CRP 2.2* 1.3* 0.9    Lab Results  Component Value Date   SARSCOV2NAA POSITIVE (A) 09/05/2019   [place] Not Detected 01/30/2019   Active Problems Essential hypertension -Continue lisinopril, blood pressure normal  Hyperlipidemia -Continue statin  Hypokalemia -Repleted  History of DM 2 -CBGs acceptable as below, continue sliding scale  CBG (last 3)  Recent Labs    09/13/19 1621 09/13/19 2129 09/14/19 0809  GLUCAP 163* 179* 161*     Scheduled Meds: . albuterol  1-2 puff Inhalation Q6H  . vitamin C  500 mg Oral Daily  . aspirin EC  81 mg Oral Daily  . clopidogrel  75 mg Oral Daily   . dexamethasone (DECADRON) injection  6 mg Intravenous Q24H  . enoxaparin (LOVENOX) injection  40 mg Subcutaneous Q24H  . famotidine  20 mg Oral BID  . furosemide  40 mg Intravenous Once  . insulin aspart  0-9 Units Subcutaneous TID WC  . lisinopril  20 mg Oral Daily  . multivitamin with minerals  1 tablet Oral Daily  . rosuvastatin  40 mg Oral q1800  . zinc sulfate  220 mg Oral Daily   Continuous Infusions:  PRN Meds:.acetaminophen, benzonatate, bisacodyl, chlorpheniramine-HYDROcodone, guaiFENesin-dextromethorphan, ondansetron **OR** ondansetron (ZOFRAN) IV, polyethylene glycol  DVT prophylaxis: Lovenox Code Status: Full code Family Communication: Discussed with patient Patient admitted from: Home Anticipated d/c place: Home Barriers to d/c: Persistent hypoxia, hopefully home tomorrow when she finishes remdesivir if she is stable on room air  Consultants:  None   Procedures:  None   Microbiology: None   Antimicrobials: None    Objective: Vitals:   09/13/19 1923 09/14/19 0423 09/14/19 0550 09/14/19 0846  BP: (!) 123/50 135/65  (!) 117/55  Pulse: 61 (!) 55  83  Resp: 16 18    Temp: 98 F (36.7 C) 98.6 F (37 C)  97.8 F (36.6 C)  TempSrc: Oral Oral  Axillary  SpO2: 92%  91% 90%  Weight:      Height:        Intake/Output Summary (Last 24 hours) at 09/14/2019 1035 Last data filed at 09/13/2019 1816 Gross per 24 hour  Intake 780 ml  Output -  Net 780  ml   Filed Weights   09/10/19 1647  Weight: 72.6 kg    Examination:  Constitutional: NAD, appears comfortable Eyes: No scleral icterus ENMT: Moist mucous membranes Neck: normal, supple Respiratory: Overall clear, no wheezing, no crackles, moves air well Cardiovascular: Regular rate and rhythm, no murmurs, no edema Abdomen: Soft, nontender, nondistended, positive bowel sounds Musculoskeletal: no clubbing / cyanosis.  Skin: No rashes seen Neurologic: Nonfocal, ambulatory  Data Reviewed: I have  independently reviewed following labs and imaging studies   CBC: Recent Labs  Lab 09/10/19 1600 09/11/19 0448 09/12/19 0625 09/13/19 0620 09/14/19 0507  WBC 3.6* 2.4* 5.7 6.5 7.0  NEUTROABS 2.3 1.7 4.5 5.2 5.4  HGB 10.1* 9.9* 9.6* 9.5* 9.9*  HCT 34.6* 33.3* 31.2* 31.4* 32.8*  MCV 83.0 82.0 81.3 79.9* 80.2  PLT 257 301 324 371 [id number]   Basic Metabolic Panel: Recent Labs  Lab 09/10/19 1600 09/11/19 0448 09/12/19 0625 09/13/19 0620 09/14/19 0507  NA 139 141 141 142 143  K 3.0* 3.8 4.4 4.3 4.5  CL 101 102 106 103 104  CO2 28 30 28 29 30   GLUCOSE 132* 156* 166* 183* 198*  BUN 28* 29* 34* 34* 33*  CREATININE 1.40* 1.21* 1.07* 0.93 1.09*  CALCIUM 8.4* 8.8* 9.0 8.9 8.9  MG  --  2.2 2.0 2.1 2.1   GFR: CrCl cannot be calculated (Unknown ideal weight.). Liver Function Tests: Recent Labs  Lab 09/10/19 1600 09/11/19 0448 09/12/19 0625 09/13/19 0620 09/14/19 0507  AST 57* 53* 38 32 31  ALT 44 43 36 36 36  ALKPHOS 38 41 38 43 43  BILITOT 0.4 0.4 0.7 0.8 0.4  PROT 6.8 6.8 6.2* 6.4* 6.2*  ALBUMIN 3.2* 3.0* 2.9* 2.9* 2.9*   No results for input(s): LIPASE, AMYLASE in the last 168 hours. No results for input(s): AMMONIA in the last 168 hours. Coagulation Profile: No results for input(s): INR, PROTIME in the last 168 hours. Cardiac Enzymes: No results for input(s): CKTOTAL, CKMB, CKMBINDEX, TROPONINI in the last 168 hours. BNP (last 3 results) No results for input(s): PROBNP in the last 8760 hours. HbA1C: No results for input(s): HGBA1C in the last 72 hours. CBG: Recent Labs  Lab 09/13/19 0734 09/13/19 1134 09/13/19 1621 09/13/19 2129 09/14/19 0809  GLUCAP 152* 170* 163* 179* 161*   Lipid Profile: No results for input(s): CHOL, HDL, LDLCALC, TRIG, CHOLHDL, LDLDIRECT in the last 72 hours. Thyroid Function Tests: No results for input(s): TSH, T4TOTAL, FREET4, T3FREE, THYROIDAB in the last 72 hours. Anemia Panel: Recent Labs    09/13/19 0620 09/14/19 0507   FERRITIN 51 40   Urine analysis:    Component Value Date/Time   COLORURINE YELLOW 02/28/2015 1011   APPEARANCEUR CLEAR 02/28/2015 1011   LABSPEC 1.025 02/28/2015 1011   PHURINE 5.5 02/28/2015 1011   GLUCOSEU NEGATIVE 02/28/2015 1011   HGBUR NEGATIVE 02/28/2015 1011   BILIRUBINUR NEGATIVE 02/28/2015 1011   KETONESUR NEGATIVE 02/28/2015 1011   PROTEINUR NEGATIVE 02/28/2015 1011   UROBILINOGEN 0.2 02/28/2015 1011   NITRITE NEGATIVE 02/28/2015 1011   LEUKOCYTESUR NEGATIVE 02/28/2015 1011   Sepsis Labs: Invalid input(s): PROCALCITONIN, LACTICIDVEN  Recent Results (from the past 240 hour(s))  SARS CORONAVIRUS 2 (TAT 6-24 HRS) Nasopharyngeal Nasopharyngeal Swab     Status: Abnormal   Collection Time: 09/05/19  6:45 PM   Specimen: Nasopharyngeal Swab  Result Value Ref Range Status   SARS Coronavirus 2 POSITIVE (A) NEGATIVE Final    Comment: RESULT CALLED TO, READ BACK BY AND  VERIFIED WITH: Jani Gravel RN 15:30 09/06/19 ([email or web address]) (NOTE) SARS-CoV-2 target nucleic acids are DETECTED. The SARS-CoV-2 RNA is generally detectable in upper and lower respiratory specimens during the acute phase of infection. Positive results are indicative of the presence of SARS-CoV-2 RNA. Clinical correlation with patient history and other diagnostic information is  necessary to determine patient infection status. Positive results do not rule out bacterial infection or co-infection with other viruses.  The expected result is Negative. Fact Sheet for Patients: SugarRoll.be Fact Sheet for Healthcare Providers: https://www.woods-mathews.com/ This test is not yet approved or cleared by the [place] FDA and  has been authorized for detection and/or diagnosis of SARS-CoV-2 by FDA under an Emergency Use Authorization (EUA). This EUA will remain  in effect (meaning this test can be used) for th e duration of the COVID-19 declaration under Section 564(b)(1) of the Act,  21 U.S.C. section 360bbb-3(b)(1), unless the authorization is terminated or revoked sooner. Performed at [facility], [place] [address], [place], [place] [address]       Radiology Studies: No results found.  Marzetta Board, MD, PhD [facility]  Contact via  [email or web address]  [place] P: [phone] F: [phone]

## 2019-09-14 NOTE — Plan of Care (Signed)
  Problem: Education: Goal: Knowledge of risk factors and measures for prevention of condition will improve Outcome: Progressing   Problem: Respiratory: Goal: Will maintain a patent airway Outcome: Progressing Goal: Complications related to the disease process, condition or treatment will be avoided or minimized Outcome: Progressing   Problem: Education: Goal: Knowledge of General Education information will improve Description: Including pain rating scale, medication(s)/side effects and non-pharmacologic comfort measures Outcome: Progressing   Problem: Health Behavior/Discharge Planning: Goal: Ability to manage health-related needs will improve Outcome: Progressing   Problem: Clinical Measurements: Goal: Ability to maintain clinical measurements within normal limits will improve Outcome: Progressing Goal: Will remain free from infection Outcome: Progressing Goal: Diagnostic test results will improve Outcome: Progressing Goal: Respiratory complications will improve Outcome: Progressing Goal: Cardiovascular complication will be avoided Outcome: Progressing   Problem: Activity: Goal: Risk for activity intolerance will decrease Outcome: Progressing   Problem: Pain Managment: Goal: General experience of comfort will improve Outcome: Progressing   Problem: Safety: Goal: Ability to remain free from injury will improve Outcome: Progressing

## 2019-09-15 LAB — COMPREHENSIVE METABOLIC PANEL
ALT: 36 U/L (ref 0–44)
AST: 25 U/L (ref 15–41)
Albumin: 2.9 g/dL — ABNORMAL LOW (ref 3.5–5.0)
Alkaline Phosphatase: 51 U/L (ref 38–126)
Anion gap: 9 (ref 5–15)
BUN: 39 mg/dL — ABNORMAL HIGH (ref 8–23)
CO2: 32 mmol/L (ref 22–32)
Calcium: 9.1 mg/dL (ref 8.9–10.3)
Chloride: 100 mmol/L (ref 98–111)
Creatinine, Ser: 1.2 mg/dL — ABNORMAL HIGH (ref 0.44–1.00)
GFR calc Af Amer: 52 mL/min — ABNORMAL LOW (ref >60)
GFR calc non Af Amer: 45 mL/min — ABNORMAL LOW (ref >60)
Glucose, Bld: 249 mg/dL — ABNORMAL HIGH (ref 70–99)
Potassium: 4.4 mmol/L (ref 3.5–5.1)
Sodium: 141 mmol/L (ref 135–145)
Total Bilirubin: 0.3 mg/dL (ref 0.3–1.2)
Total Protein: 6.2 g/dL — ABNORMAL LOW (ref 6.5–8.1)

## 2019-09-15 LAB — CBC WITH DIFFERENTIAL/PLATELET
Abs Immature Granulocytes: 0.14 10*3/uL — ABNORMAL HIGH (ref 0.00–0.07)
Basophils Absolute: 0 10*3/uL (ref 0.0–0.1)
Basophils Relative: 0 %
Eosinophils Absolute: 0 10*3/uL (ref 0.0–0.5)
Eosinophils Relative: 0 %
HCT: 33 % — ABNORMAL LOW (ref 36.0–46.0)
Hemoglobin: 9.9 g/dL — ABNORMAL LOW (ref 12.0–15.0)
Immature Granulocytes: 2 %
Lymphocytes Relative: 13 %
Lymphs Abs: 1.2 10*3/uL (ref 0.7–4.0)
MCH: 24.2 pg — ABNORMAL LOW (ref 26.0–34.0)
MCHC: 30 g/dL (ref 30.0–36.0)
MCV: 80.7 fL (ref 80.0–100.0)
Monocytes Absolute: 0.6 10*3/uL (ref 0.1–1.0)
Monocytes Relative: 7 %
Neutro Abs: 6.8 10*3/uL (ref 1.7–7.7)
Neutrophils Relative %: 78 %
Platelets: 421 10*3/uL — ABNORMAL HIGH (ref 150–400)
RBC: 4.09 MIL/uL (ref 3.87–5.11)
RDW: 17.1 % — ABNORMAL HIGH (ref 11.5–15.5)
WBC: 8.6 10*3/uL (ref 4.0–10.5)
nRBC: 0.5 % — ABNORMAL HIGH (ref 0.0–0.2)

## 2019-09-15 LAB — C-REACTIVE PROTEIN: CRP: 0.7 mg/dL (ref <1.0)

## 2019-09-15 LAB — GLUCOSE, CAPILLARY
Glucose-Capillary: 213 mg/dL — ABNORMAL HIGH (ref 70–99)
Glucose-Capillary: 183 mg/dL — ABNORMAL HIGH (ref 70–99)

## 2019-09-15 LAB — MAGNESIUM: Magnesium: 2.4 mg/dL (ref 1.7–2.4)

## 2019-09-15 LAB — FERRITIN: Ferritin: 34 ng/mL (ref 11–307)

## 2019-09-15 LAB — D-DIMER, QUANTITATIVE: D-Dimer, Quant: <0.27 ug/mL-FEU (ref 0.00–0.50)

## 2019-09-15 MED ORDER — DEXAMETHASONE 6 MG PO TABS: 6.0000 mg | ORAL_TABLET | Freq: Every day | ORAL | 0 refills | Status: DC

## 2019-09-15 NOTE — Discharge Summary (Signed)
Physician Discharge Summary  Nicole Horn [id number] DOB: Dec 04, 1945 DOA: 09/10/2019  PCP: Patient, No Pcp Per  Admit date: 09/10/2019 Discharge date: 09/15/2019  Admitted From: Home Disposition: Home  Recommendations for Outpatient Follow-up:  1. Follow up with PCP in 1-2 weeks 2. Continue Decadron for 5 additional days  Home Health: none Equipment/Devices: home O2, 2L [place]  Discharge Condition: Stable CODE STATUS: Full code Diet recommendation: Diabetic  HPI: Per admitting MD, Nicole Horn  is a 74 y.o. female, with past medical history of hypertension, hyperlipidemia, diabetes mellitus diet-controlled, apnea does not wear CPAP, patient was diagnosed with COVID-19 1/14, she was referred to monoclonal antibody infusion clinic, patient was noted to be short of breath, with some increased work of breathing, her oxygen saturation was 77%, she was referred to [facility] for direct admission for her acute hypoxic respiratory failure from COVID-19 for pneumonia, and reports she has been feeling weak, with poor appetite, generalized body ache for the last week, diagnosed with COVID-19 1/14, reports her dyspnea over the last 24 hours, denies diarrhea, she does report some cough with blood-tinged sputum. Patient labs still pending, including CBC, CMP, CRP, D-dimers, her chest x-ray showing worsening of pneumonia.  Hospital Course / Discharge diagnoses: Acute Hypoxic Respiratory Failure due to Covid-19 Viral Illness /post Covid pneumonitis-patient was admitted to the hospital as she was found hypoxic in the infusion center.  She required 2 L while hospitalized, completed remdesivir course in the hospital and she was also placed on steroids.  Clinically she has improved, her shortness of breath has completely resolved, she is able to ambulate without difficulties, her chest x-ray showed slight improvement however she was still hypoxic requiring 2 L nasal cannula.  She  does have a remote history of tobacco use which is potentially contributing.  She received several doses of IV Lasix while hospitalized.  Given clinical return to baseline she will be discharged home in stable condition with home oxygen.  She was advised to see PCP within couple of weeks.  Her CRP, ferritin, D-dimer are all normal on discharge.  COVID-19 Labs  Recent Labs    09/13/19 0620 09/14/19 0507 09/15/19 0505  DDIMER <0.27 0.33 <0.27  FERRITIN 51 40 34  CRP 1.3* 0.9 0.7    Lab Results  Component Value Date   SARSCOV2NAA POSITIVE (A) 09/05/2019   [place] Not Detected 01/30/2019   Active Problems Essential hypertension -continue home medications on discharge Hyperlipidemia -Continue statin Hypokalemia -Repleted, her potassium has been and has remained normal, hold home potassium supplements until she gets blood work done as an outpatient History of DM 2 -diet controlled, A1c 6.0. CBGs acceptable as below, continue sliding scale  Discharge Instructions   Allergies as of 09/15/2019   No Known Allergies     Medication List    STOP taking these medications   acyclovir 400 MG tablet Commonly known as: ZOVIRAX   doxycycline 100 MG capsule Commonly known as: VIBRAMYCIN     TAKE these medications   acetaminophen 500 MG tablet Commonly known as: TYLENOL Take 1,000 mg by mouth every 6 (six) hours as needed for moderate pain or headache.   albuterol 108 (90 Base) MCG/ACT inhaler Commonly known as: VENTOLIN HFA Inhale 2 puffs into the lungs every 4 (four) hours as needed for wheezing or shortness of breath.   amLODipine 10 MG tablet Commonly known as: NORVASC Take 10 mg by mouth daily.   aspirin EC 81 MG tablet Take 81 mg by mouth  daily.   benzonatate 100 MG capsule Commonly known as: TESSALON Take 2 capsules (200 mg total) by mouth 2 (two) times daily as needed for cough.   clopidogrel 75 MG tablet Commonly known as: PLAVIX Take 75 mg by mouth daily.    dexamethasone 6 MG tablet Commonly known as: DECADRON Take 1 tablet (6 mg total) by mouth daily.   famotidine 20 MG tablet Commonly known as: PEPCID Take 1 tablet (20 mg total) by mouth 2 (two) times daily.   hydrochlorothiazide 25 MG tablet Commonly known as: HYDRODIURIL Take 25 mg by mouth daily.   multivitamin with minerals Tabs tablet Take 1 tablet by mouth daily.   quinapril 20 MG tablet Commonly known as: ACCUPRIL Take 20 mg by mouth daily.   rosuvastatin 40 MG tablet Commonly known as: CRESTOR Take 40 mg by mouth daily.      Consultations:  None  Procedures/Studies:  None  DG CHEST PORT 1 VIEW  Result Date: 09/14/2019 CLINICAL DATA:  Hypoxemia EXAM: PORTABLE CHEST 1 VIEW COMPARISON:  09/10/2019 FINDINGS: Persistent increased density at the left lung base. This has slightly improved. No pleural effusion or pneumothorax. Stable cardiomediastinal contours. IMPRESSION: Persistent left basilar opacity with some improvement in aeration since 09/10/2019. Electronically Signed   By: Macy Mis M.D.   On: 09/14/2019 11:42   DG Chest Port 1 View  Result Date: 09/10/2019 CLINICAL DATA:  COVID-19 EXAM: PORTABLE CHEST 1 VIEW COMPARISON:  09/06/2019 FINDINGS: Slight interval increase in heterogeneous airspace opacity of the left lung base. The upper lungs remain well aerated. Mild cardiomegaly. IMPRESSION: Slight interval increase in heterogeneous airspace opacity of the left lung base, consistent with worsened infection or aspiration. Electronically Signed   By: Eddie Candle M.D.   On: 09/10/2019 15:38   DG Chest Portable 1 View  Result Date: 09/06/2019 CLINICAL DATA:  Shortness of breath EXAM: PORTABLE CHEST 1 VIEW COMPARISON:  09/05/2019 FINDINGS: New left lung base opacity. Low lung volumes. Stable cardiomediastinal silhouette with aortic atherosclerosis. No pneumothorax. IMPRESSION: Low lung volumes. New airspace disease at the left base concerning for a pneumonia.  Electronically Signed   By: Donavan Foil M.D.   On: 09/06/2019 17:57   DG Chest Port 1 View  Result Date: 09/05/2019 CLINICAL DATA:  74 year old female with cough and fever. EXAM: PORTABLE CHEST 1 VIEW COMPARISON:  Chest radiograph dated 08/19/2009 FINDINGS: Shallow inspiration. No focal consolidation, pleural effusion, or pneumothorax. Stable cardiac silhouette. Atherosclerotic calcification of the aorta. Degenerative changes of the spine. No acute osseous pathology. IMPRESSION: No focal consolidation. Electronically Signed   By: Anner Crete M.D.   On: 09/05/2019 19:33      Subjective: - no chest pain, shortness of breath, no abdominal pain, nausea or vomiting.   Discharge Exam: BP 125/61   Pulse 80   Temp 98.2 F (36.8 C) (Oral)   Resp 18   Ht (!) 5" (0.127 m)   Wt 72.6 kg   SpO2 90%   BMI 4499.73 kg/m   General: Pt is alert, awake, not in acute distress Cardiovascular: RRR, S1/S2 +, no rubs, no gallops Respiratory: CTA bilaterally, no wheezing, no rhonchi Abdominal: Soft, NT, ND, bowel sounds + Extremities: no edema, no cyanosis  The results of significant diagnostics from this hospitalization (including imaging, microbiology, ancillary and laboratory) are listed below for reference.     Microbiology: Recent Results (from the past 240 hour(s))  SARS CORONAVIRUS 2 (TAT 6-24 HRS) Nasopharyngeal Nasopharyngeal Swab     Status: Abnormal  Collection Time: 09/05/19  6:45 PM   Specimen: Nasopharyngeal Swab  Result Value Ref Range Status   SARS Coronavirus 2 POSITIVE (A) NEGATIVE Final    Comment: RESULT CALLED TO, READ BACK BY AND VERIFIED WITH: Jani Gravel RN 15:30 09/06/19 ([email or web address]) (NOTE) SARS-CoV-2 target nucleic acids are DETECTED. The SARS-CoV-2 RNA is generally detectable in upper and lower respiratory specimens during the acute phase of infection. Positive results are indicative of the presence of SARS-CoV-2 RNA. Clinical correlation with patient history and  other diagnostic information is  necessary to determine patient infection status. Positive results do not rule out bacterial infection or co-infection with other viruses.  The expected result is Negative. Fact Sheet for Patients: SugarRoll.be Fact Sheet for Healthcare Providers: https://www.woods-mathews.com/ This test is not yet approved or cleared by the [place] FDA and  has been authorized for detection and/or diagnosis of SARS-CoV-2 by FDA under an Emergency Use Authorization (EUA). This EUA will remain  in effect (meaning this test can be used) for th e duration of the COVID-19 declaration under Section 564(b)(1) of the Act, 21 U.S.C. section 360bbb-3(b)(1), unless the authorization is terminated or revoked sooner. Performed at [facility], [place] [address] [place], [place] [address]      Labs: Basic Metabolic Panel: Recent Labs  Lab 09/11/19 0448 09/12/19 0625 09/13/19 0620 09/14/19 0507 09/15/19 0505  NA 141 141 142 143 141  K 3.8 4.4 4.3 4.5 4.4  CL 102 106 103 104 100  CO2 30 28 29 30  32  GLUCOSE 156* 166* 183* 198* 249*  BUN 29* 34* 34* 33* 39*  CREATININE 1.21* 1.07* 0.93 1.09* 1.20*  CALCIUM 8.8* 9.0 8.9 8.9 9.1  MG 2.2 2.0 2.1 2.1 2.4   Liver Function Tests: Recent Labs  Lab 09/11/19 0448 09/12/19 0625 09/13/19 0620 09/14/19 0507 09/15/19 0505  AST 53* 38 32 31 25  ALT 43 36 36 36 36  ALKPHOS 41 38 43 43 51  BILITOT 0.4 0.7 0.8 0.4 0.3  PROT 6.8 6.2* 6.4* 6.2* 6.2*  ALBUMIN 3.0* 2.9* 2.9* 2.9* 2.9*   CBC: Recent Labs  Lab 09/11/19 0448 09/12/19 0625 09/13/19 0620 09/14/19 0507 09/15/19 0505  WBC 2.4* 5.7 6.5 7.0 8.6  NEUTROABS 1.7 4.5 5.2 5.4 6.8  HGB 9.9* 9.6* 9.5* 9.9* 9.9*  HCT 33.3* 31.2* 31.4* 32.8* 33.0*  MCV 82.0 81.3 79.9* 80.2 80.7  PLT 301 324 371 400 421*   CBG: Recent Labs  Lab 09/14/19 0809 09/14/19 1207 09/14/19 1655 09/15/19 0839 09/15/19 1154  GLUCAP 161* 146*  135* 213* 183*   Hgb A1c No results for input(s): HGBA1C in the last 72 hours. Lipid Profile No results for input(s): CHOL, HDL, LDLCALC, TRIG, CHOLHDL, LDLDIRECT in the last 72 hours. Thyroid function studies No results for input(s): TSH, T4TOTAL, T3FREE, THYROIDAB in the last 72 hours.  Invalid input(s): FREET3 Urinalysis    Component Value Date/Time   COLORURINE YELLOW 02/28/2015 [place] 02/28/2015 1011   LABSPEC 1.025 02/28/2015 1011   PHURINE 5.5 02/28/2015 1011   GLUCOSEU NEGATIVE 02/28/2015 1011   HGBUR NEGATIVE 02/28/2015 [place] 02/28/2015 1011   KETONESUR NEGATIVE 02/28/2015 1011   PROTEINUR NEGATIVE 02/28/2015 1011   UROBILINOGEN 0.2 02/28/2015 1011   NITRITE NEGATIVE 02/28/2015 [place] 02/28/2015 1011    FURTHER DISCHARGE INSTRUCTIONS:   Get Medicines reviewed and adjusted: Please take all your medications with you for your next visit with your Primary  MD   Laboratory/radiological data: Please request your Primary MD to go over all hospital tests and procedure/radiological results at the follow up, please ask your Primary MD to get all Hospital records sent to his/her office.   In some cases, they will be blood work, cultures and biopsy results pending at the time of your discharge. Please request that your primary care M.D. goes through all the records of your hospital data and follows up on these results.   Also Note the following: If you experience worsening of your admission symptoms, develop shortness of breath, life threatening emergency, suicidal or homicidal thoughts you must seek medical attention immediately by calling 911 or calling your MD immediately  if symptoms less severe.   You must read complete instructions/literature along with all the possible adverse reactions/side effects for all the Medicines you take and that have been prescribed to you. Take any new Medicines after you have  completely understood and accpet all the possible adverse reactions/side effects.    Do not drive when taking Pain medications or sleeping medications (Benzodaizepines)   Do not take more than prescribed Pain, Sleep and Anxiety Medications. It is not advisable to combine anxiety,sleep and pain medications without talking with your primary care practitioner   Special Instructions: If you have smoked or chewed Tobacco  in the last 2 yrs please stop smoking, stop any regular Alcohol  and or any Recreational drug use.   Wear Seat belts while driving.   Please note: You were cared for by a hospitalist during your hospital stay. Once you are discharged, your primary care physician will handle any further medical issues. Please note that NO REFILLS for any discharge medications will be authorized once you are discharged, as it is imperative that you return to your primary care physician (or establish a relationship with a primary care physician if you do not have one) for your post hospital discharge needs so that they can reassess your need for medications and monitor your lab values.  Time coordinating discharge: 35 minutes  SIGNED:  Marzetta Board, MD, PhD 09/15/2019, 12:16 PM

## 2019-09-15 NOTE — Discharge Instructions (Signed)
Follow with Primary MD in 1-2 weeks  Please get a complete blood count and chemistry panel checked by your Primary MD at your next visit, and again as instructed by your Primary MD. Please get your medications reviewed and adjusted by your Primary MD.  Please request your Primary MD to go over all Hospital Tests and Procedure/Radiological results at the follow up, please get all Hospital records sent to your Prim MD by signing hospital release before you go home.  In some cases, there will be blood work, cultures and biopsy results pending at the time of your discharge. Please request that your primary care M.D. goes through all the records of your hospital data and follows up on these results.  If you had Pneumonia of Lung problems at the Hospital: Please get a 2 view Chest X ray done in 6-8 weeks after hospital discharge or sooner if instructed by your Primary MD.  If you have Congestive Heart Failure: Please call your Cardiologist or Primary MD anytime you have any of the following symptoms:  1) 3 pound weight gain in 24 hours or 5 pounds in 1 week  2) shortness of breath, with or without a dry hacking cough  3) swelling in the hands, feet or stomach  4) if you have to sleep on extra pillows at night in order to breathe  Follow cardiac low salt diet and 1.5 lit/day fluid restriction.  If you have diabetes Accuchecks 4 times/day, Once in AM empty stomach and then before each meal. Log in all results and show them to your primary doctor at your next visit. If any glucose reading is under 80 or above 300 call your primary MD immediately.  If you have Seizure/Convulsions/Epilepsy: Please do not drive, operate heavy machinery, participate in activities at heights or participate in high speed sports until you have seen by Primary MD or a Neurologist and advised to do so again. Per [facility] statutes, patients with seizures are not allowed to drive until they have been seizure-free  for six months.  Use caution when using heavy equipment or power tools. Avoid working on ladders or at heights. Take showers instead of baths. Ensure the water temperature is not too high on the home water heater. Do not go swimming alone. Do not lock yourself in a room alone (i.e. bathroom). When caring for infants or small children, sit down when holding, feeding, or changing them to minimize risk of injury to the child in the event you have a seizure. Maintain good sleep hygiene. Avoid alcohol.   If you had Gastrointestinal Bleeding: Please ask your Primary MD to check a complete blood count within one week of discharge or at your next visit. Your endoscopic/colonoscopic biopsies that are pending at the time of discharge, will also need to followed by your Primary MD.  Get Medicines reviewed and adjusted. Please take all your medications with you for your next visit with your Primary MD  Please request your Primary MD to go over all hospital tests and procedure/radiological results at the follow up, please ask your Primary MD to get all Hospital records sent to his/her office.  If you experience worsening of your admission symptoms, develop shortness of breath, life threatening emergency, suicidal or homicidal thoughts you must seek medical attention immediately by calling 911 or calling your MD immediately  if symptoms less severe.  You must read complete instructions/literature along with all the possible adverse reactions/side effects for all the Medicines you take and  that have been prescribed to you. Take any new Medicines after you have completely understood and accpet all the possible adverse reactions/side effects.   Do not drive or operate heavy machinery when taking Pain medications.   Do not take more than prescribed Pain, Sleep and Anxiety Medications  Special Instructions: If you have smoked or chewed Tobacco  in the last 2 yrs please stop smoking, stop any regular Alcohol  and or  any Recreational drug use.  Wear Seat belts while driving.  Please note You were cared for by a hospitalist during your hospital stay. If you have any questions about your discharge medications or the care you received while you were in the hospital after you are discharged, you can call the unit and asked to speak with the hospitalist on call if the hospitalist that took care of you is not available. Once you are discharged, your primary care physician will handle any further medical issues. Please note that NO REFILLS for any discharge medications will be authorized once you are discharged, as it is imperative that you return to your primary care physician (or establish a relationship with a primary care physician if you do not have one) for your aftercare needs so that they can reassess your need for medications and monitor your lab values.  You can reach the hospitalist office at phone [phone] or fax [phone]   If you do not have a primary care physician, you can call [phone] for a physician referral.  Activity: As tolerated with Full fall precautions use walker/cane & assistance as needed    Diet: regular  Disposition Home     COVID-19 COVID-19 is a respiratory infection that is caused by a virus called severe acute respiratory syndrome coronavirus 2 (SARS-CoV-2). The disease is also known as coronavirus disease or novel coronavirus. In some people, the virus may not cause any symptoms. In others, it may cause a serious infection. The infection can get worse quickly and can lead to complications, such as:  Pneumonia, or infection of the lungs.  Acute respiratory distress syndrome or ARDS. This is a condition in which fluid build-up in the lungs prevents the lungs from filling with air and passing oxygen into the blood.  Acute respiratory failure. This is a condition in which there is not enough oxygen passing from the lungs to the body or when carbon dioxide is not passing  from the lungs out of the body.  Sepsis or septic shock. This is a serious bodily reaction to an infection.  Blood clotting problems.  Secondary infections due to bacteria or fungus.  Organ failure. This is when your body's organs stop working. The virus that causes COVID-19 is contagious. This means that it can spread from person to person through droplets from coughs and sneezes (respiratory secretions). What are the causes? This illness is caused by a virus. You may catch the virus by:  Breathing in droplets from an infected person. Droplets can be spread by a person breathing, speaking, singing, coughing, or sneezing.  Touching something, like a table or a doorknob, that was exposed to the virus (contaminated) and then touching your mouth, nose, or eyes. What increases the risk? Risk for infection You are more likely to be infected with this virus if you:  Are within 6 feet (2 meters) of a person with COVID-19.  Provide care for or live with a person who is infected with COVID-19.  Spend time in crowded indoor spaces or live in shared housing.  Risk for serious illness You are more likely to become seriously ill from the virus if you:  Are 36 years of age or older. The higher your age, the more you are at risk for serious illness.  Live in a nursing home or long-term care facility.  Have cancer.  Have a long-term (chronic) disease such as: ? Chronic lung disease, including chronic obstructive pulmonary disease or asthma. ? A long-term disease that lowers your body's ability to fight infection (immunocompromised). ? Heart disease, including heart failure, a condition in which the arteries that lead to the heart become narrow or blocked (coronary artery disease), a disease which makes the heart muscle thick, weak, or stiff (cardiomyopathy). ? Diabetes. ? Chronic kidney disease. ? Sickle cell disease, a condition in which red blood cells have an abnormal "sickle"  shape. ? Liver disease.  Are obese. What are the signs or symptoms? Symptoms of this condition can range from mild to severe. Symptoms may appear any time from 2 to 14 days after being exposed to the virus. They include:  A fever or chills.  A cough.  Difficulty breathing.  Headaches, body aches, or muscle aches.  Runny or stuffy (congested) nose.  A sore throat.  New loss of taste or smell. Some people may also have stomach problems, such as nausea, vomiting, or diarrhea. Other people may not have any symptoms of COVID-19. How is this diagnosed? This condition may be diagnosed based on:  Your signs and symptoms, especially if: ? You live in an area with a COVID-19 outbreak. ? You recently traveled to or from an area where the virus is common. ? You provide care for or live with a person who was diagnosed with COVID-19. ? You were exposed to a person who was diagnosed with COVID-19.  A physical exam.  Lab tests, which may include: ? Taking a sample of fluid from the back of your nose and throat (nasopharyngeal fluid), your nose, or your throat using a swab. ? A sample of mucus from your lungs (sputum). ? Blood tests.  Imaging tests, which may include, X-rays, CT scan, or ultrasound. How is this treated? At present, there is no medicine to treat COVID-19. Medicines that treat other diseases are being used on a trial basis to see if they are effective against COVID-19. Your health care provider will talk with you about ways to treat your symptoms. For most people, the infection is mild and can be managed at home with rest, fluids, and over-the-counter medicines. Treatment for a serious infection usually takes places in a hospital intensive care unit (ICU). It may include one or more of the following treatments. These treatments are given until your symptoms improve.  Receiving fluids and medicines through an IV.  Supplemental oxygen. Extra oxygen is given through a tube in  the nose, a face mask, or a hood.  Positioning you to lie on your stomach (prone position). This makes it easier for oxygen to get into the lungs.  Continuous positive airway pressure (CPAP) or bi-level positive airway pressure (BPAP) machine. This treatment uses mild air pressure to keep the airways open. A tube that is connected to a motor delivers oxygen to the body.  Ventilator. This treatment moves air into and out of the lungs by using a tube that is placed in your windpipe.  Tracheostomy. This is a procedure to create a hole in the neck so that a breathing tube can be inserted.  Extracorporeal membrane oxygenation (ECMO). This procedure  gives the lungs a chance to recover by taking over the functions of the heart and lungs. It supplies oxygen to the body and removes carbon dioxide. Follow these instructions at home: Lifestyle  If you are sick, stay home except to get medical care. Your health care provider will tell you how long to stay home. Call your health care provider before you go for medical care.  Rest at home as told by your health care provider.  Do not use any products that contain nicotine or tobacco, such as cigarettes, e-cigarettes, and chewing tobacco. If you need help quitting, ask your health care provider.  Return to your normal activities as told by your health care provider. Ask your health care provider what activities are safe for you. General instructions  Take over-the-counter and prescription medicines only as told by your health care provider.  Drink enough fluid to keep your urine pale yellow.  Keep all follow-up visits as told by your health care provider. This is important. How is this prevented?  There is no vaccine to help prevent COVID-19 infection. However, there are steps you can take to protect yourself and others from this virus. To protect yourself:   Do not travel to areas where COVID-19 is a risk. The areas where COVID-19 is reported  change often. To identify high-risk areas and travel restrictions, check the CDC travel website: FatFares.com.br  If you live in, or must travel to, an area where COVID-19 is a risk, take precautions to avoid infection. ? Stay away from people who are sick. ? Wash your hands often with soap and water for 20 seconds. If soap and water are not available, use an alcohol-based hand sanitizer. ? Avoid touching your mouth, face, eyes, or nose. ? Avoid going out in public, follow guidance from your state and local health authorities. ? If you must go out in public, wear a cloth face covering or face mask. Make sure your mask covers your nose and mouth. ? Avoid crowded indoor spaces. Stay at least 6 feet (2 meters) away from others. ? Disinfect objects and surfaces that are frequently touched every day. This may include:  Counters and tables.  Doorknobs and light switches.  Sinks and faucets.  Electronics, such as phones, remote controls, keyboards, computers, and tablets. To protect others: If you have symptoms of COVID-19, take steps to prevent the virus from spreading to others.  If you think you have a COVID-19 infection, contact your health care provider right away. Tell your health care team that you think you may have a COVID-19 infection.  Stay home. Leave your house only to seek medical care. Do not use public transport.  Do not travel while you are sick.  Wash your hands often with soap and water for 20 seconds. If soap and water are not available, use alcohol-based hand sanitizer.  Stay away from other members of your household. Let healthy household members care for children and pets, if possible. If you have to care for children or pets, wash your hands often and wear a mask. If possible, stay in your own room, separate from others. Use a different bathroom.  Make sure that all people in your household wash their hands well and often.  Cough or sneeze into a tissue  or your sleeve or elbow. Do not cough or sneeze into your hand or into the air.  Wear a cloth face covering or face mask. Make sure your mask covers your nose and mouth. Where to  find more information  Centers for Disease Control and Prevention: PurpleGadgets.be  World Health Organization: https://www.castaneda.info/ Contact a health care provider if:  You live in or have traveled to an area where COVID-19 is a risk and you have symptoms of the infection.  You have had contact with someone who has COVID-19 and you have symptoms of the infection. Get help right away if:  You have trouble breathing.  You have pain or pressure in your chest.  You have confusion.  You have bluish lips and fingernails.  You have difficulty waking from sleep.  You have symptoms that get worse. These symptoms may represent a serious problem that is an emergency. Do not wait to see if the symptoms will go away. Get medical help right away. Call your local emergency services (911 in the [place] Do not drive yourself to the hospital. Let the emergency medical personnel know if you think you have COVID-19. Summary  COVID-19 is a respiratory infection that is caused by a virus. It is also known as coronavirus disease or novel coronavirus. It can cause serious infections, such as pneumonia, acute respiratory distress syndrome, acute respiratory failure, or sepsis.  The virus that causes COVID-19 is contagious. This means that it can spread from person to person through droplets from breathing, speaking, singing, coughing, or sneezing.  You are more likely to develop a serious illness if you are 85 years of age or older, have a weak immune system, live in a nursing home, or have chronic disease.  There is no medicine to treat COVID-19. Your health care provider will talk with you about ways to treat your symptoms.  Take steps to protect yourself and others from infection.  Wash your hands often and disinfect objects and surfaces that are frequently touched every day. Stay away from people who are sick and wear a mask if you are sick. This information is not intended to replace advice given to you by your health care provider. Make sure you discuss any questions you have with your health care provider. Document Revised: 06/08/2019 Document Reviewed: 09/14/2018 Elsevier Patient Education  2020 [place].  COVID-19: How to Protect Yourself and Others Know how it spreads  There is currently no vaccine to prevent coronavirus disease 2019 (COVID-19).  The best way to prevent illness is to avoid being exposed to this virus.  The virus is thought to spread mainly from person-to-person. ? Between people who are in close contact with one another (within about 6 feet). ? Through respiratory droplets produced when an infected person coughs, sneezes or talks. ? These droplets can land in the mouths or noses of people who are nearby or possibly be inhaled into the lungs. ? COVID-19 may be spread by people who are not showing symptoms. Everyone should Clean your hands often  Wash your hands often with soap and water for at least 20 seconds especially after you have been in a public place, or after blowing your nose, coughing, or sneezing.  If soap and water are not readily available, use a hand sanitizer that contains at least 60% alcohol. Cover all surfaces of your hands and rub them together until they feel dry.  Avoid touching your eyes, nose, and mouth with unwashed hands. Avoid close contact  Limit contact with others as much as possible.  Avoid close contact with people who are sick.  Put distance between yourself and other people. ? Remember that some people without symptoms may be able to spread virus. ?  This is especially important for people who are at higher risk of getting very  GainPain.com.cy Cover your mouth and nose with a mask when around others  You could spread COVID-19 to others even if you do not feel sick.  Everyone should wear a mask in public settings and when around people not living in their household, especially when social distancing is difficult to maintain. ? Masks should not be placed on young children under age 46, anyone who has trouble breathing, or is unconscious, incapacitated or otherwise unable to remove the mask without assistance.  The mask is meant to protect other people in case you are infected.  Do NOT use a facemask meant for a Dietitian.  Continue to keep about 6 feet between yourself and others. The mask is not a substitute for social distancing. Cover coughs and sneezes  Always cover your mouth and nose with a tissue when you cough or sneeze or use the inside of your elbow.  Throw used tissues in the trash.  Immediately wash your hands with soap and water for at least 20 seconds. If soap and water are not readily available, clean your hands with a hand sanitizer that contains at least 60% alcohol. Clean and disinfect  Clean AND disinfect frequently touched surfaces daily. This includes tables, doorknobs, light switches, countertops, handles, desks, phones, keyboards, toilets, faucets, and sinks. RackRewards.fr  If surfaces are dirty, clean them: Use detergent or soap and water prior to disinfection.  Then, use a household disinfectant. You can see a list of EPA-registered household disinfectants here. michellinders.com 04/25/2019 This information is not intended to replace advice given to you by your health care provider. Make sure you discuss any questions you have with your health care provider. Document Revised: 05/03/2019 Document Reviewed: 03/01/2019 Elsevier Patient  Education  [place].

## 2019-09-15 NOTE — Plan of Care (Signed)
  Problem: Education: Goal: Knowledge of risk factors and measures for prevention of condition will improve Outcome: Adequate for Discharge   

## 2019-09-15 NOTE — Progress Notes (Signed)
Patient ambulated around room approx 500 feet, no supportive oxygen, o2 sats 90%, asymptomatic

## 2019-09-15 NOTE — Progress Notes (Addendum)
SATURATION QUALIFICATIONS: (This note is used to comply with regulatory documentation for home oxygen)  Patient Saturations on Room Air at Rest = 94 Patient Saturations on Room Air while Ambulating = 92%  Patient Saturations on  Liters of oxygen while Ambulating = 2  Please briefly explain why patient needs home oxygen: Patients O2 sats dropped as low as 86% when ambulating, then ambulated on 2L [place] to maintain O2 sats above 88%

## 2019-10-01 ENCOUNTER — Ambulatory Visit: Payer: Medicare (Managed Care) | Attending: Internal Medicine

## 2019-10-01 ENCOUNTER — Other Ambulatory Visit: Payer: Self-pay

## 2019-10-01 DIAGNOSIS — Z20822 Contact with and (suspected) exposure to covid-19: Secondary | ICD-10-CM

## 2019-10-02 LAB — NOVEL CORONAVIRUS, NAA: SARS-CoV-2, NAA: NOT DETECTED

## 2019-10-05 ENCOUNTER — Encounter (HOSPITAL_COMMUNITY): Payer: Self-pay | Admitting: Emergency Medicine

## 2019-10-05 ENCOUNTER — Emergency Department (HOSPITAL_COMMUNITY)
Admission: EM | Admit: 2019-10-05 | Discharge: 2019-10-06 | Disposition: A | Discharge status: Home / Self Care | Payer: Medicare (Managed Care) | Attending: Emergency Medicine | Admitting: Emergency Medicine

## 2019-10-05 ENCOUNTER — Emergency Department (HOSPITAL_COMMUNITY): Payer: Medicare (Managed Care)

## 2019-10-05 ENCOUNTER — Other Ambulatory Visit: Payer: Self-pay

## 2019-10-05 DIAGNOSIS — Z8616 Personal history of COVID-19: Secondary | ICD-10-CM | POA: Diagnosis not present

## 2019-10-05 DIAGNOSIS — Z7982 Long term (current) use of aspirin: Secondary | ICD-10-CM | POA: Diagnosis not present

## 2019-10-05 DIAGNOSIS — Z87891 Personal history of nicotine dependence: Secondary | ICD-10-CM | POA: Diagnosis not present

## 2019-10-05 DIAGNOSIS — Z79899 Other long term (current) drug therapy: Secondary | ICD-10-CM | POA: Insufficient documentation

## 2019-10-05 DIAGNOSIS — E119 Type 2 diabetes mellitus without complications: Secondary | ICD-10-CM | POA: Insufficient documentation

## 2019-10-05 DIAGNOSIS — R0602 Shortness of breath: Secondary | ICD-10-CM | POA: Diagnosis present

## 2019-10-05 DIAGNOSIS — I10 Essential (primary) hypertension: Secondary | ICD-10-CM | POA: Insufficient documentation

## 2019-10-05 DIAGNOSIS — R0600 Dyspnea, unspecified: Secondary | ICD-10-CM

## 2019-10-05 LAB — CBC WITH DIFFERENTIAL/PLATELET
Abs Immature Granulocytes: 0 10*3/uL (ref 0.00–0.07)
Basophils Absolute: 0 10*3/uL (ref 0.0–0.1)
Basophils Relative: 1 %
Eosinophils Absolute: 0.3 10*3/uL (ref 0.0–0.5)
Eosinophils Relative: 7 %
HCT: 33 % — ABNORMAL LOW (ref 36.0–46.0)
Hemoglobin: 9.6 g/dL — ABNORMAL LOW (ref 12.0–15.0)
Immature Granulocytes: 0 %
Lymphocytes Relative: 37 %
Lymphs Abs: 1.5 10*3/uL (ref 0.7–4.0)
MCH: 24.7 pg — ABNORMAL LOW (ref 26.0–34.0)
MCHC: 29.1 g/dL — ABNORMAL LOW (ref 30.0–36.0)
MCV: 84.8 fL (ref 80.0–100.0)
Monocytes Absolute: 0.5 10*3/uL (ref 0.1–1.0)
Monocytes Relative: 12 %
Neutro Abs: 1.9 10*3/uL (ref 1.7–7.7)
Neutrophils Relative %: 43 %
Platelets: 289 10*3/uL (ref 150–400)
RBC: 3.89 MIL/uL (ref 3.87–5.11)
RDW: 16.9 % — ABNORMAL HIGH (ref 11.5–15.5)
WBC: 4.1 10*3/uL (ref 4.0–10.5)
nRBC: 0 % (ref 0.0–0.2)

## 2019-10-05 LAB — COMPREHENSIVE METABOLIC PANEL
ALT: 22 U/L (ref 0–44)
AST: 23 U/L (ref 15–41)
Albumin: 3.3 g/dL — ABNORMAL LOW (ref 3.5–5.0)
Alkaline Phosphatase: 45 U/L (ref 38–126)
Anion gap: 12 (ref 5–15)
BUN: 10 mg/dL (ref 8–23)
CO2: 25 mmol/L (ref 22–32)
Calcium: 8.6 mg/dL — ABNORMAL LOW (ref 8.9–10.3)
Chloride: 104 mmol/L (ref 98–111)
Creatinine, Ser: 0.99 mg/dL (ref 0.44–1.00)
GFR calc Af Amer: >60 mL/min (ref >60)
GFR calc non Af Amer: 57 mL/min — ABNORMAL LOW (ref >60)
Glucose, Bld: 132 mg/dL — ABNORMAL HIGH (ref 70–99)
Potassium: 2.9 mmol/L — ABNORMAL LOW (ref 3.5–5.1)
Sodium: 141 mmol/L (ref 135–145)
Total Bilirubin: 0.5 mg/dL (ref 0.3–1.2)
Total Protein: 6.8 g/dL (ref 6.5–8.1)

## 2019-10-05 LAB — BRAIN NATRIURETIC PEPTIDE: B Natriuretic Peptide: 28 pg/mL (ref 0.0–100.0)

## 2019-10-05 LAB — TROPONIN I (HIGH SENSITIVITY)
Troponin I (High Sensitivity): 4 ng/L (ref <18)
Troponin I (High Sensitivity): 5 ng/L (ref <18)

## 2019-10-05 LAB — D-DIMER, QUANTITATIVE: D-Dimer, Quant: 0.55 ug/mL-FEU — ABNORMAL HIGH (ref 0.00–0.50)

## 2019-10-05 MED ORDER — ALBUTEROL SULFATE HFA 108 (90 BASE) MCG/ACT IN AERS
2.0000 | INHALATION_SPRAY | Freq: Once | RESPIRATORY_TRACT | Status: AC
  Administered 2019-10-05: 2 via RESPIRATORY_TRACT
  Filled 2019-10-05: qty 6.7

## 2019-10-05 NOTE — ED Notes (Signed)
Inhaler dispensed to patient for home use along with her required dose given prior to discharge

## 2019-10-05 NOTE — ED Triage Notes (Signed)
Pt here tonight c/o increased SHOB today. Pt Recently dx with COVID on 1/14 but has since had a negative result. Pt currently on 3L [place] at home since having COVID.

## 2019-10-05 NOTE — Discharge Instructions (Addendum)
Follow up next week for recheck.  Return if problems

## 2019-10-07 NOTE — ED Provider Notes (Signed)
[facility] EMERGENCY DEPARTMENT Provider Note   CSN: [id number] Arrival date & time: 10/05/19  2048     History Chief Complaint  Patient presents with  . Shortness of Breath    Nicole Horn is a 74 y.o. female.  Patient with a history of Covid infection.  She has also been hospitalized for it..  Patient returns complaining of some shortness of breath  The history is provided by the patient. No language interpreter was used.  Shortness of Breath Severity:  Mild Onset quality:  Gradual Timing:  Intermittent Progression:  Waxing and waning Chronicity:  Recurrent Context: activity   Relieved by:  Nothing Associated symptoms: no abdominal pain, no chest pain, no cough, no headaches and no rash        Past Medical History:  Diagnosis Date  . Diabetes mellitus without complication (HCC)    no medications, uses diet and exercise to control  . High cholesterol   . Hypertension   . Sleep apnea    Pt supposed to wear CPAP, but doesn't.    Patient Active Problem List   Diagnosis Date Noted  . Pneumonia due to COVID-19 virus 09/10/2019  . HTN (hypertension) 09/10/2019  . Hyperlipemia 09/10/2019  . Acute respiratory disease due to COVID-19 virus 09/10/2019  . 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 ABDOMINAL HYSTERECTOMY    . BACK SURGERY    . BARTHOLIN GLAND CYST EXCISION Left 03/04/2015   Procedure: EXCISION OF LEFT BARTHOLINS TUMOR;  Surgeon: [name], MD;  Location: [facility];  Service: Gynecology;  Laterality: Left;  . CERVICAL SPINE SURGERY    . CYST REMOVAL TRUNK    . multiple cyst removal surgeries       OB History    Gravida      Para      Term      Preterm      AB      Living  1     SAB      TAB      Ectopic      Multiple      Live Births              Family History  Problem Relation Age of Onset  . Cancer Mother        colon  . Cancer Sister    breast  . Cancer Maternal Aunt        stomach    Social History   Tobacco Use  . Smoking status: Former Smoker    Packs/day: 2.00    Years: 25.00    Pack years: 50.00    Types: Cigarettes  . Smokeless tobacco: Never Used  Substance Use Topics  . Alcohol use: Not Currently    Alcohol/week: 0.0 standard drinks    Comment: socially  . Drug use: No    Home Medications Prior to Admission medications   Medication Sig Start Date End Date Taking? Authorizing Provider  albuterol (VENTOLIN HFA) 108 (90 Base) MCG/ACT inhaler Inhale 2 puffs into the lungs every 4 (four) hours as needed for wheezing or shortness of breath.   Yes [provider]  amLODipine (NORVASC) 10 MG tablet Take 10 mg by mouth daily.   Yes [provider]  aspirin EC 81 MG tablet Take 81 mg by mouth daily.   Yes [provider]  clopidogrel (PLAVIX) 75 MG tablet Take 75 mg by mouth daily.  Yes [provider]  hydrochlorothiazide (HYDRODIURIL) 25 MG tablet Take 25 mg by mouth daily.   Yes [provider]  Multiple Vitamin (MULTIVITAMIN WITH MINERALS) TABS tablet Take 1 tablet by mouth daily.   Yes [provider]  quinapril (ACCUPRIL) 20 MG tablet Take 20 mg by mouth daily.   Yes [provider]  rosuvastatin (CRESTOR) 40 MG tablet Take 40 mg by mouth daily.   Yes [provider]  acetaminophen (TYLENOL) 500 MG tablet Take 1,000 mg by mouth every 6 (six) hours as needed for moderate pain or headache.    [provider]  benzonatate (TESSALON) 100 MG capsule Take 2 capsules (200 mg total) by mouth 2 (two) times daily as needed for cough. 09/05/19   Noemi Chapel, MD  dexamethasone (DECADRON) 6 MG tablet Take 1 tablet (6 mg total) by mouth daily. 09/15/19   Caren Griffins, MD  famotidine (PEPCID) 20 MG tablet Take 1 tablet (20 mg total) by mouth 2 (two) times daily. 09/06/19   Isla Pence, MD    Allergies    Patient has no known  allergies.  Review of Systems   Review of Systems  Constitutional: Negative for appetite change and fatigue.  HENT: Negative for congestion, ear discharge and sinus pressure.   Eyes: Negative for discharge.  Respiratory: Positive for shortness of breath. Negative for cough.   Cardiovascular: Negative for chest pain.  Gastrointestinal: Negative for abdominal pain and diarrhea.  Genitourinary: Negative for frequency and hematuria.  Musculoskeletal: Negative for back pain.  Skin: Negative for rash.  Neurological: Negative for seizures and headaches.  Psychiatric/Behavioral: Negative for hallucinations.    Physical Exam Updated Vital Signs BP 133/65   Pulse 78   Temp 98.1 F (36.7 C) (Oral)   Resp 20   Ht 5' (1.524 m)   Wt 67.6 kg   SpO2 93%   BMI 29.10 kg/m   Physical Exam Vitals and nursing note reviewed.  Constitutional:      Appearance: She is well-developed.  HENT:     Head: Normocephalic.     Nose: Nose normal.  Eyes:     General: No scleral icterus.    Conjunctiva/sclera: Conjunctivae normal.  Neck:     Thyroid: No thyromegaly.  Cardiovascular:     Rate and Rhythm: Normal rate and regular rhythm.     Heart sounds: No murmur. No friction rub. No gallop.   Pulmonary:     Breath sounds: No stridor. Wheezing present. No rales.  Chest:     Chest wall: No tenderness.  Abdominal:     General: There is no distension.     Tenderness: There is no abdominal tenderness. There is no rebound.  Musculoskeletal:        General: Normal range of motion.     Cervical back: Neck supple.  Lymphadenopathy:     Cervical: No cervical adenopathy.  Skin:    Findings: No erythema or rash.  Neurological:     Mental Status: She is alert and oriented to person, place, and time.     Motor: No abnormal muscle tone.     Coordination: Coordination normal.  Psychiatric:        Behavior: Behavior normal.     ED Results / Procedures / Treatments   Labs (all labs ordered are  listed, but only abnormal results are displayed) Labs Reviewed  CBC WITH DIFFERENTIAL/PLATELET - Abnormal; Notable for the following components:      Result Value   Hemoglobin  9.6 (*)    HCT 33.0 (*)    MCH 24.7 (*)    MCHC 29.1 (*)    RDW 16.9 (*)    All other components within normal limits  COMPREHENSIVE METABOLIC PANEL - Abnormal; Notable for the following components:   Potassium 2.9 (*)    Glucose, Bld 132 (*)    Calcium 8.6 (*)    Albumin 3.3 (*)    GFR calc non Af Amer 57 (*)    All other components within normal limits  D-DIMER, QUANTITATIVE (NOT AT [facility]) - Abnormal; Notable for the following components:   D-Dimer, Quant 0.55 (*)    All other components within normal limits  BRAIN NATRIURETIC PEPTIDE  TROPONIN I (HIGH SENSITIVITY)  TROPONIN I (HIGH SENSITIVITY)    EKG EKG Interpretation  Date/Time:  Friday October 05 2019 21:26:53 EST Ventricular Rate:  85 PR Interval:    QRS Duration: 90 QT Interval:  364 QTC Calculation: 433 R Axis:   21 Text Interpretation: Sinus rhythm Nonspecific T abnormalities, lateral leads Confirmed by Milton Ferguson [phone]) on 10/05/2019 11:03:52 PM   Radiology DG Chest Portable 1 View  Result Date: 10/05/2019 CLINICAL DATA:  Increased shortness of breath today. Diagnosed with COVID 1 month ago. EXAM: PORTABLE CHEST 1 VIEW COMPARISON:  Radiograph 09/14/2019 FINDINGS: Lower lung volumes from prior exam. Progressive airspace opacities in the left lung base and periphery of the left mid lung zone. Increased streaky opacity at the right lung base. Unchanged heart size and mediastinal contours allowing for differences in technique. Aortic atherosclerosis. No pneumothorax or large pleural effusion. Unchanged osseous structures. IMPRESSION: Lower lung volumes from prior. Increased opacity in the left greater than right lung, pattern most consistent with sequela of COVID pneumonia. Electronically Signed   By: Keith Rake M.D.   On: 10/05/2019  21:30    Procedures Procedures (including critical care time)  Medications Ordered in ED Medications  albuterol (VENTOLIN HFA) 108 (90 Base) MCG/ACT inhaler 2 puff (2 puffs Inhalation Given 10/05/19 2337)    ED Course  I have reviewed the triage vital signs and the nursing notes.  Pertinent labs & imaging results that were available during my care of the patient were reviewed by me and considered in my medical decision making (see chart for details).    MDM Rules/Calculators/A&P                      Patient with shortness of breath secondary to Covid infection.  Patient was given albuterol inhaler and felt better.  She was nontoxic and was discharged home Final Clinical Impression(s) / ED Diagnoses Final diagnoses:  Dyspnea, unspecified type    Rx / DC Orders ED Discharge Orders    None       Milton Ferguson, MD 10/07/19 [phone]

## 2019-10-15 ENCOUNTER — Ambulatory Visit: Payer: Medicare (Managed Care) | Admitting: Internal Medicine

## 2019-10-16 ENCOUNTER — Ambulatory Visit (INDEPENDENT_AMBULATORY_CARE_PROVIDER_SITE_OTHER): Payer: Medicare (Managed Care) | Admitting: Family Medicine

## 2019-10-16 ENCOUNTER — Other Ambulatory Visit: Payer: Self-pay

## 2019-10-16 ENCOUNTER — Encounter: Payer: Self-pay | Admitting: Family Medicine

## 2019-10-16 ENCOUNTER — Ambulatory Visit: Payer: Medicare (Managed Care) | Admitting: Family Medicine

## 2019-10-16 VITALS — BP 126/76 | HR 102 | Temp 97.7°F | Resp 15 | Ht 60.0 in | Wt 154.0 lb

## 2019-10-16 DIAGNOSIS — E669 Obesity, unspecified: Secondary | ICD-10-CM | POA: Insufficient documentation

## 2019-10-16 DIAGNOSIS — U071 COVID-19: Secondary | ICD-10-CM | POA: Diagnosis not present

## 2019-10-16 DIAGNOSIS — Z9981 Dependence on supplemental oxygen: Secondary | ICD-10-CM

## 2019-10-16 DIAGNOSIS — I10 Essential (primary) hypertension: Secondary | ICD-10-CM | POA: Diagnosis not present

## 2019-10-16 DIAGNOSIS — E785 Hyperlipidemia, unspecified: Secondary | ICD-10-CM

## 2019-10-16 DIAGNOSIS — R5381 Other malaise: Secondary | ICD-10-CM

## 2019-10-16 DIAGNOSIS — E663 Overweight: Secondary | ICD-10-CM | POA: Insufficient documentation

## 2019-10-16 NOTE — Progress Notes (Signed)
Subjective:  Patient ID: Nicole Horn, female    DOB: 1946-02-23  Age: 74 y.o. MRN: [id number]  CC:  Chief Complaint  Patient presents with  . New Patient (Initial Visit)    establish care, wants to discuss oxygen prescription      HPI  HPI   Ms. Nicole Horn is a 74 year old female patient who presents today to establish care in addition to help getting herself set up with oxygen as she is recovering from Covid and ever since having Covid she has had trouble with low oxygen levels, cough that is mild to moderate and dry in nature, and occasional nosebleeds.  She was sent home with oxygen but the tank is so hard for her to pull around because of her deconditioning and shortness of breath with breathing.  She needs a portable tank.  She lives 6 months here roughly in 6 months in [place].  She reports that she is up-to-date on all of her health maintenance including mammogram, colonoscopy, bone scans as a like that she received up in [place].  And will sign a lease release form so that we can get those into her chart.  Health history includes recent COVID-19 illness, diabetes, hyper tension, high cholesterol.  High family history of cancer of the stomach, colon, breast.  Reports that she used to smoke has not smoked in some time per her.  Does not drink or use illicit drugs per her.  She helps to take care of her 10 year old mother with her sister who lives in [place].  She also has a brother who lives in [place] still.  She has 1 son 6 grandkids and 7 great grandson.  She enjoyed exercising which included dancing prior to Covid and has not been able to get back to that secondary to being short of breath and coughing.  She tries to eat very healthy and avoid eating fried foods, fast food, red meats.  She drinks 1 cup of coffee in the morning sometimes in the evening drinks 3-4 bottles of water daily.  Overall she tries to take very good care of herself and until Covid  she reports that she was in good health.  Today patient denies signs and symptoms of COVID 19 infection including fever, chills, cough, shortness of breath, and headache. Past Medical, Surgical, Social History, Allergies, and Medications have been Reviewed.   Past Medical History:  Diagnosis Date  . Acute respiratory disease due to COVID-19 virus 09/10/2019  . 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 Diabetes mellitus without complication (HCC)    no medications, uses diet and exercise to control  . High cholesterol   . Hypertension   . Pneumonia due to COVID-19 virus 09/10/2019  . Sleep apnea    Pt supposed to wear CPAP, but doesn't.    Current Meds  Medication Sig  . acetaminophen (TYLENOL) 500 MG tablet Take 1,000 mg by mouth every 6 (six) hours as needed for moderate pain or headache.  . albuterol (VENTOLIN HFA) 108 (90 Base) MCG/ACT inhaler Inhale 2 puffs into the lungs every 4 (four) hours as needed for wheezing or shortness of breath.  Marland Kitchen amLODipine (NORVASC) 10 MG tablet Take 10 mg by mouth daily.  Marland Kitchen aspirin EC 81 MG tablet Take 81 mg by mouth daily.  . clopidogrel (PLAVIX) 75 MG tablet Take 75 mg by mouth daily.  . hydrochlorothiazide (HYDRODIURIL) 25 MG tablet Take 25 mg by mouth daily.  . Multiple  Vitamin (MULTIVITAMIN WITH MINERALS) TABS tablet Take 1 tablet by mouth daily.  . quinapril (ACCUPRIL) 20 MG tablet Take 20 mg by mouth daily.  . rosuvastatin (CRESTOR) 40 MG tablet Take 40 mg by mouth daily.    ROS:  Review of Systems  Constitutional: Negative.   HENT: Negative.   Eyes: Negative.   Respiratory: Positive for cough and shortness of breath.   Cardiovascular: Negative.   Gastrointestinal: Negative.   Genitourinary: Negative.   Musculoskeletal: Negative.   Skin: Negative.   Neurological: Negative.   Endo/Heme/Allergies: Negative.   Psychiatric/Behavioral: Negative.   All other systems reviewed and are negative.   Objective:   Today's Vitals: BP  126/76   Pulse (!) 102   Temp 97.7 F (36.5 C) (Temporal)   Resp 15   Ht 5' (1.524 m)   Wt 154 lb (69.9 kg)   SpO2 93%   BMI 30.08 kg/m  Vitals with BMI 10/16/2019 10/05/2019 10/05/2019  Height 5\' 0"  - -  Weight 154 lbs - -  BMI Q000111Q - -  Systolic 123XX123 - -  Diastolic 76 - -  Pulse [id number] 78 72     Physical Exam Vitals and nursing note reviewed.  Constitutional:      Appearance: Normal appearance. She is well-developed and well-groomed. She is obese.  HENT:     Head: Normocephalic and atraumatic.     Right Ear: External ear normal.     Left Ear: External ear normal.     Mouth/Throat:     Comments: Mask in place  Eyes:     General:        Right eye: No discharge.        Left eye: No discharge.     Conjunctiva/sclera: Conjunctivae normal.  Cardiovascular:     Rate and Rhythm: Normal rate and regular rhythm.     Pulses: Normal pulses.     Heart sounds: Normal heart sounds.  Pulmonary:     Effort: Pulmonary effort is normal.     Breath sounds: Examination of the right-middle field reveals decreased breath sounds. Examination of the left-middle field reveals decreased breath sounds. Examination of the right-lower field reveals decreased breath sounds. Examination of the left-lower field reveals decreased breath sounds. Decreased breath sounds present.  Musculoskeletal:        General: Normal range of motion.     Cervical back: Normal range of motion and neck supple.  Skin:    General: Skin is warm.  Neurological:     General: No focal deficit present.     Mental Status: She is alert and oriented to person, place, and time.  Psychiatric:        Attention and Perception: Attention normal.        Mood and Affect: Mood normal.        Speech: Speech normal.        Behavior: Behavior normal. Behavior is cooperative.        Thought Content: Thought content normal.        Cognition and Memory: Cognition normal.        Judgment: Judgment normal.     Oxygen  qualification Sitting 93% room air Walking 82% room air Walking 95% 2 L O2 via nasal cannula   Assessment   1. Essential hypertension   2. Hyperlipidemia, unspecified hyperlipidemia type   3. Obesity (BMI 30-39.9)   4. COVID-19   5. Oxygen dependent   6. Physical deconditioning     Tests ordered Orders  Placed This Encounter  Procedures  . For home use only DME oxygen  . Ambulatory referral to [place]: Please see assessment and plan per problem list above.   No orders of the defined types were placed in this encounter.   Patient to follow-up in 4 weeks.  [name], NP

## 2019-10-16 NOTE — Patient Instructions (Addendum)
May you have a year filled with hope, love, happiness and laughter.  I appreciate the opportunity to provide you with care for your health and wellness. Today we discussed: establish care and oxygen needs  Follow up: 4 weeks   No labs or referrals today  Home health order today for PT to help strength and endurance with activity   Inhaler use: Coughing fit, shortness of breath, and wheeze  Use 10-15 minutes before activity to with shortness of breath.  Please continue to practice social distancing to keep you, your family, and our community safe.  If you must go out, please wear a mask and practice good handwashing.  It was a pleasure to see you and I look forward to continuing to work together on your health and well-being. Please do not hesitate to call the office if you need care or have questions about your care.  Have a wonderful day and week. With Gratitude,  Cherly Beach, DNP, AGNP-BC    Kegel Exercises  Kegel exercises can help strengthen your pelvic floor muscles. The pelvic floor is a group of muscles that support your rectum, small intestine, and bladder. In females, pelvic floor muscles also help support the womb (uterus). These muscles help you control the flow of urine and stool. Kegel exercises are painless and simple, and they do not require any equipment. Your provider may suggest Kegel exercises to:  Improve bladder and bowel control.  Improve sexual response.  Improve weak pelvic floor muscles after surgery to remove the uterus (hysterectomy) or pregnancy (females).  Improve weak pelvic floor muscles after prostate gland removal or surgery (males). Kegel exercises involve squeezing your pelvic floor muscles, which are the same muscles you squeeze when you try to stop the flow of urine or keep from passing gas. The exercises can be done while sitting, standing, or lying down, but it is best to vary your position. Exercises How to do Kegel  exercises: 1. Squeeze your pelvic floor muscles tight. You should feel a tight lift in your rectal area. If you are a female, you should also feel a tightness in your vaginal area. Keep your stomach, buttocks, and legs relaxed. 2. Hold the muscles tight for up to 10 seconds. 3. Breathe normally. 4. Relax your muscles. 5. Repeat as told by your health care provider. Repeat this exercise daily as told by your health care provider. Continue to do this exercise for at least 4-6 weeks, or for as long as told by your health care provider. You may be referred to a physical therapist who can help you learn more about how to do Kegel exercises. Depending on your condition, your health care provider may recommend:  Varying how long you squeeze your muscles.  Doing several sets of exercises every day.  Doing exercises for several weeks.  Making Kegel exercises a part of your regular exercise routine. This information is not intended to replace advice given to you by your health care provider. Make sure you discuss any questions you have with your health care provider. Document Revised: 03/29/2018 Document Reviewed: 03/29/2018 Elsevier Patient Education  [place].

## 2019-10-17 ENCOUNTER — Other Ambulatory Visit: Payer: Self-pay

## 2019-10-17 DIAGNOSIS — R7981 Abnormal blood-gas level: Secondary | ICD-10-CM

## 2019-10-21 DIAGNOSIS — R5381 Other malaise: Secondary | ICD-10-CM

## 2019-10-21 DIAGNOSIS — Z9981 Dependence on supplemental oxygen: Secondary | ICD-10-CM | POA: Insufficient documentation

## 2019-10-21 HISTORY — DX: Dependence on supplemental oxygen: Z99.81

## 2019-10-21 HISTORY — DX: Other malaise: R53.81

## 2019-10-21 NOTE — Assessment & Plan Note (Signed)
Oxygen ordered as she needs a portable tank to help carry around she does not have the strength to carry the big tank around.  Provided with you request for this today as well as physical therapy OT and respiratory therapy to help her with pulmonary toileting to try to educate her on today in addition to helping with deconditioning.

## 2019-10-21 NOTE — Assessment & Plan Note (Signed)
Nicole Horn is encouraged to maintain a well balanced diet that is low in salt. Controlled, continue current medication regimen.  Additionally, she is also reminded that exercise is beneficial for heart health and control of  Blood pressure. 30-60 minutes daily is recommended-walking was suggested.

## 2019-10-21 NOTE — Assessment & Plan Note (Signed)
Encouraged to eat a heart healthy low-fat diet and to exercise as much she can.

## 2019-10-21 NOTE — Assessment & Plan Note (Signed)
In process of resolving.  Does require oxygen at this time.

## 2019-10-21 NOTE — Assessment & Plan Note (Signed)
Home health ordered in addition with PT OT and RT to help with deconditioning and pulmonary toileting.

## 2019-10-21 NOTE — Assessment & Plan Note (Signed)
[name] is re-educated about the importance of exercise daily to help with weight management. A minumum of 30 minutes daily is recommended. Additionally, importance of healthy food choices  with portion control discussed.   Wt Readings from Last 3 Encounters:  10/16/19 154 lb (69.9 kg)  10/05/19 149 lb (67.6 kg)  09/10/19 160 lb (72.6 kg)

## 2019-10-22 ENCOUNTER — Other Ambulatory Visit: Payer: Self-pay

## 2019-10-22 DIAGNOSIS — Z9981 Dependence on supplemental oxygen: Secondary | ICD-10-CM

## 2019-10-25 ENCOUNTER — Telehealth: Payer: Self-pay

## 2019-10-25 NOTE — Telephone Encounter (Signed)
Spoke with patient and she said customer service for [facility] said there wasn't enough information on the prescription such as how long she needed it for. All information is included in the order. The order is filled out completely. Called Ida with [employer] and left a message to follow up with him regarding patient's order.

## 2019-10-25 NOTE — Telephone Encounter (Signed)
Advised patient insurance would not cover portable oxygen for her due to the only diagnosis is Covid. She verbalized understanding.

## 2019-10-25 NOTE — Telephone Encounter (Signed)
PT LVM that she needs a call regarding her oxygen

## 2019-11-02 ENCOUNTER — Ambulatory Visit: Payer: Medicare Other | Attending: Internal Medicine

## 2019-11-02 DIAGNOSIS — Z23 Encounter for immunization: Secondary | ICD-10-CM

## 2019-11-02 NOTE — Progress Notes (Signed)
   Covid-19 Vaccination Clinic  Name:  Bunny Hort    MRN: [id number] DOB: 07-06-1946  11/02/2019  [name] was observed post Covid-19 immunization for 15 minutes without incident. She was provided with Vaccine Information Sheet and instruction to access the V-Safe system.   Ms. Lesnick was instructed to call 911 with any severe reactions post vaccine: Marland Kitchen Difficulty breathing  . Swelling of face and throat  . A fast heartbeat  . A bad rash all over body  . Dizziness and weakness   Immunizations Administered    Name Date Dose VIS Date Route   Moderna COVID-19 Vaccine 11/02/2019  9:48 AM 0.5 mL 07/24/2019 Intramuscular   Manufacturer: Moderna   Lot: [id number]   [id number]

## 2019-11-08 ENCOUNTER — Telehealth: Payer: Self-pay

## 2019-11-08 NOTE — Addendum Note (Signed)
Addended by: Eual Fines on: 11/08/2019 11:12 AM   Modules accepted: Orders

## 2019-11-08 NOTE — Addendum Note (Signed)
Addended by: Eual Fines on: 11/08/2019 11:10 AM   Modules accepted: Orders

## 2019-11-08 NOTE — Telephone Encounter (Signed)
[facility] is calling and they cannot provide oxygen until they have updated chart notes with a chronic condition noted As of now there is only covid 19 and oxygen dependency but no chronic condition listed. Please advise

## 2019-11-08 NOTE — Telephone Encounter (Signed)
Has been weaning herself off the oxygen and is using cough med that is working well and thinks she will be fine without it. Will discuss further at her appt next week

## 2019-11-13 ENCOUNTER — Encounter: Payer: Self-pay | Admitting: Family Medicine

## 2019-11-13 ENCOUNTER — Other Ambulatory Visit: Payer: Self-pay

## 2019-11-13 ENCOUNTER — Ambulatory Visit (INDEPENDENT_AMBULATORY_CARE_PROVIDER_SITE_OTHER): Payer: Medicare Other | Admitting: Family Medicine

## 2019-11-13 VITALS — BP 140/72 | HR 89 | Temp 97.5°F | Resp 16 | Ht 60.0 in | Wt 155.0 lb

## 2019-11-13 DIAGNOSIS — M79644 Pain in right finger(s): Secondary | ICD-10-CM

## 2019-11-13 DIAGNOSIS — R059 Cough, unspecified: Secondary | ICD-10-CM

## 2019-11-13 DIAGNOSIS — G8929 Other chronic pain: Secondary | ICD-10-CM | POA: Diagnosis not present

## 2019-11-13 DIAGNOSIS — R05 Cough: Secondary | ICD-10-CM | POA: Insufficient documentation

## 2019-11-13 DIAGNOSIS — I10 Essential (primary) hypertension: Secondary | ICD-10-CM

## 2019-11-13 DIAGNOSIS — R058 Other specified cough: Secondary | ICD-10-CM | POA: Insufficient documentation

## 2019-11-13 HISTORY — DX: Pain in right finger(s): M79.644

## 2019-11-13 HISTORY — DX: Other chronic pain: G89.29

## 2019-11-13 NOTE — Assessment & Plan Note (Signed)
Stable but reports she just took her medicine right before coming to the office.   Advised for her to take her medication consistently at the same time every day.  As well as maintain a-diet and well-balanced diet.  Additionally reminded about exercise being beneficial and walking 30 to 60 minutes is suggested.  Especially now that she has become to move around and not have to wear oxygen as much.  Reviewed side effects, risks and benefits of medication.   Patient acknowledged agreement and understanding of the plan.

## 2019-11-13 NOTE — Progress Notes (Signed)
Subjective:  Patient ID: Nicole Horn, female    DOB: 06-Nov-1945  Age: 74 y.o. MRN: [id number]  CC:  Chief Complaint  Patient presents with  . Cough    still having a dry cough since having covid in Jan when she talks alot       HPI  HPI  Ms. Nicole Horn is a 74 year old who presents today for follow-up still having cough from having Covid in January.  She reports that it happens when she talks a lot but otherwise it just waxes and wanes he does not have really any particular time that it occurs.  She reports it is dry in nature she never gets anything up.  She does not use oxygen anymore that she was on.  She reports that she has increased energy levels and has been able to move around very well.  This is a big change from last month where she was still oxygen dependent and having significant amount of deconditioning and shortness of breath with mobility.  She reports that her only concern today is that she has right some discomfort in the joint.  She had a bad fall in 2017 on that right hand and she reports has not been right ever since.  She describes that it as a throbbing pain.  Certain movements make it worse.  Tylenol has created some relief but is been getting worse off and on here in the last couple months.  She used to receive injections when she was in [place] she is followed by orthopedist there.  They had recommended some type of surgery.  She does not remember exactly what she was diagnosed with she was trying to think it was carpal tunnel related.  Her pain level is a 6 out of 10 when it is aggravating her. Is open to having referral here in town.  Today patient denies signs and symptoms of COVID 19 infection including fever, chills, cough, shortness of breath, and headache. Past Medical, Surgical, Social History, Allergies, and Medications have been Reviewed.   Past Medical History:  Diagnosis Date  . Acute respiratory disease due to COVID-19 virus 09/10/2019  .  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 COVID-19 09/10/2019  . Diabetes mellitus without complication (HCC)    no medications, uses diet and exercise to control  . High cholesterol   . Hypertension   . Oxygen dependent 10/21/2019  . Physical deconditioning 10/21/2019  . Pneumonia due to COVID-19 virus 09/10/2019  . Sleep apnea    Pt supposed to wear CPAP, but doesn't.    Current Meds  Medication Sig  . acetaminophen (TYLENOL) 500 MG tablet Take 1,000 mg by mouth every 6 (six) hours as needed for moderate pain or headache.  . albuterol (VENTOLIN HFA) 108 (90 Base) MCG/ACT inhaler Inhale 2 puffs into the lungs every 4 (four) hours as needed for wheezing or shortness of breath.  Marland Kitchen amLODipine (NORVASC) 10 MG tablet Take 10 mg by mouth daily.  Marland Kitchen aspirin EC 81 MG tablet Take 81 mg by mouth daily.  . clopidogrel (PLAVIX) 75 MG tablet Take 75 mg by mouth daily.  Marland Kitchen dextromethorphan (DELSYM) 30 MG/5ML liquid Take by mouth as needed for cough. Prn  . hydrochlorothiazide (HYDRODIURIL) 25 MG tablet Take 25 mg by mouth daily.  . Multiple Vitamin (MULTIVITAMIN WITH MINERALS) TABS tablet Take 1 tablet by mouth daily.  . quinapril (ACCUPRIL) 20 MG tablet Take 20 mg by mouth daily.  . rosuvastatin (CRESTOR) 40  MG tablet Take 40 mg by mouth daily.    ROS:  Review of Systems  Constitutional: Negative.   HENT: Negative.   Eyes: Negative.   Respiratory: Positive for cough.   Cardiovascular: Negative.   Gastrointestinal: Negative.   Genitourinary: Negative.   Musculoskeletal: Positive for joint pain.  Skin: Negative.   Neurological: Negative.   Endo/Heme/Allergies: Negative.   Psychiatric/Behavioral: Negative.   All other systems reviewed and are negative.   Objective:   Today's Vitals: BP 140/72   Pulse 89   Temp (!) 97.5 F (36.4 C) (Temporal)   Resp 16   Ht 5' (1.524 m)   Wt 155 lb (70.3 kg)   SpO2 95%   BMI 30.27 kg/m  Vitals with BMI 11/13/2019 10/16/2019 10/05/2019  Height 5'  0" 5\' 0"  -  Weight 155 lbs 154 lbs -  BMI [id number] -  Systolic [id number] -  Diastolic 72 76 -  Pulse 89 102 78     Physical Exam Vitals and nursing note reviewed.  Constitutional:      Appearance: Normal appearance. She is well-developed and well-groomed. She is obese.  HENT:     Head: Normocephalic and atraumatic.     Right Ear: External ear normal.     Left Ear: External ear normal.     Mouth/Throat:     Comments: Mask in place  Eyes:     General:        Right eye: No discharge.        Left eye: No discharge.     Conjunctiva/sclera: Conjunctivae normal.  Cardiovascular:     Rate and Rhythm: Normal rate and regular rhythm.     Pulses: Normal pulses.     Heart sounds: Normal heart sounds.  Pulmonary:     Effort: Pulmonary effort is normal.     Breath sounds: Normal breath sounds.  Musculoskeletal:        General: Normal range of motion.     Right hand: Swelling present.     Cervical back: Normal range of motion and neck supple.     Comments: Swelling over the snuffbox aspect of the thumb.  Skin:    General: Skin is warm.  Neurological:     General: No focal deficit present.     Mental Status: She is alert and oriented to person, place, and time.  Psychiatric:        Attention and Perception: Attention normal.        Mood and Affect: Mood normal.        Speech: Speech normal.        Behavior: Behavior normal. Behavior is cooperative.        Thought Content: Thought content normal.        Cognition and Memory: Cognition normal.        Judgment: Judgment normal.     Assessment   1. Chronic thumb pain, right   2. Essential hypertension   3. Cough     Tests ordered Orders Placed This Encounter  Procedures  . Ambulatory referral to Orthopedic Surgery     Plan: Please see assessment and plan per problem list above.   No orders of the defined types were placed in this encounter.   Patient to follow-up in August 2021.  [name], NP

## 2019-11-13 NOTE — Assessment & Plan Note (Signed)
Improved over the last month, still has dry cough especially when she talks for long periods of time but overall is gotten much better.  She is no longer having to wear oxygen nor did she have any physical deconditioning with her mobility.

## 2019-11-13 NOTE — Patient Instructions (Addendum)
I appreciate the opportunity to provide you with care for your health and wellness. Today we discussed: cough and thumb pain on right side  Follow up: August for Annual and fasting that day  No labs   Referral to Ortho office in town.  Please continue to practice social distancing to keep you, your family, and our community safe.  If you must go out, please wear a mask and practice good handwashing.  It was a pleasure to see you and I look forward to continuing to work together on your health and well-being. Please do not hesitate to call the office if you need care or have questions about your care.  Have a wonderful day and week. With Gratitude, Cherly Beach, DNP, AGNP-BC

## 2019-11-13 NOTE — Assessment & Plan Note (Signed)
resolved 

## 2019-11-13 NOTE — Assessment & Plan Note (Signed)
Reports possible diagnosis of carpal tunnel?  Unsure of the exact diagnosis is open to referral to orthopedist today.  Provided this to orthopedics here in town.  Thank you for collaboration in her care.

## 2019-11-19 ENCOUNTER — Telehealth: Payer: Self-pay | Admitting: *Deleted

## 2019-11-19 NOTE — Telephone Encounter (Signed)
Pt called stated she was on oxygen from the hospital but this needs to be discontinued from her primary provider. This needs to be called into [facility] so they can come and pick up the equipment from her home.

## 2019-11-20 NOTE — Telephone Encounter (Signed)
D/C order sent to [facility] as well as notifying the rep that was renewing the order

## 2019-11-22 ENCOUNTER — Ambulatory Visit: Payer: Medicare Other | Admitting: Orthopedic Surgery

## 2019-12-05 ENCOUNTER — Ambulatory Visit: Payer: Medicare Other | Attending: Internal Medicine

## 2019-12-05 DIAGNOSIS — Z23 Encounter for immunization: Secondary | ICD-10-CM

## 2019-12-05 NOTE — Progress Notes (Signed)
   Covid-19 Vaccination Clinic  Name:  Nicole Horn    MRN: [id number] DOB: [birth year]  12/05/2019  [name] was observed post Covid-19 immunization for 15 minutes without incident. She was provided with Vaccine Information Sheet and instruction to access the V-Safe system.   [name] was instructed to call 911 with any severe reactions post vaccine: Marland Kitchen Difficulty breathing  . Swelling of face and throat  . A fast heartbeat  . A bad rash all over body  . Dizziness and weakness   Immunizations Administered    Name Date Dose VIS Date Route   Moderna COVID-19 Vaccine 12/05/2019 11:22 AM 0.5 mL 07/24/2019 Intramuscular   Manufacturer: Moderna   Lot: [id number]   [id number]

## 2019-12-07 ENCOUNTER — Ambulatory Visit: Payer: Medicare Other | Admitting: Orthopedic Surgery

## 2019-12-07 ENCOUNTER — Other Ambulatory Visit: Payer: Self-pay

## 2019-12-07 VITALS — BP 122/78 | HR 74 | Temp 97.2°F | Ht 61.0 in | Wt 155.0 lb

## 2019-12-07 DIAGNOSIS — M19049 Primary osteoarthritis, unspecified hand: Secondary | ICD-10-CM | POA: Diagnosis not present

## 2019-12-07 DIAGNOSIS — M79644 Pain in right finger(s): Secondary | ICD-10-CM | POA: Diagnosis not present

## 2019-12-07 NOTE — Patient Instructions (Signed)

## 2019-12-07 NOTE — Progress Notes (Signed)
Chief Complaint  Patient presents with  . Follow-up    Recheck on right thumb, trigger finger.    74 year old female follows up for right thumb triggering she had an injection in the right thumb on February 12, 2019  She now complains of pain at the [facility] joint of her right thumb.  She says it started after she fell.  Complains of pain right over the joint  System review no numbness or tingling  Hip injection and knee injection helped her arthritic pain in those joints  BP 122/78   Pulse 74   Temp (!) 97.2 F (36.2 C)   Ht 5\' 1"  (1.549 m)   Wt 155 lb (70.3 kg)   BMI 29.29 kg/m   Right thumb compared to left thumb shows subluxation although both have some grinding on range of motion the left one is nontender to palpation the right 1 is very tender.  Pinch is intact.  Neurovascular exam is normal  Reasonable at this point to try an injection I let her know it may or may not work as the thumb is subluxating out of joint  Procedure note injection right thumb  Injection right CMC joint Medication Depo-Medrol 40 mg and lidocaine 1% 2 cc The patient gave verbal consent Timeout confirmed the site of injection right CMC joint  Alcohol and ethyl chloride was used to repair the skin and then a 25-gauge needle was used to inject the [facility] joint of the right thumb  There were no complications a sterile Band-Aid was applied

## 2019-12-10 ENCOUNTER — Other Ambulatory Visit: Payer: Self-pay | Admitting: *Deleted

## 2019-12-10 MED ORDER — AMLODIPINE BESYLATE 10 MG PO TABS: 10.0000 mg | ORAL_TABLET | Freq: Every day | ORAL | 3 refills | Status: DC

## 2019-12-11 ENCOUNTER — Other Ambulatory Visit: Payer: Self-pay

## 2019-12-11 ENCOUNTER — Ambulatory Visit (INDEPENDENT_AMBULATORY_CARE_PROVIDER_SITE_OTHER): Payer: Medicare Other | Admitting: Family Medicine

## 2019-12-11 ENCOUNTER — Encounter: Payer: Self-pay | Admitting: Family Medicine

## 2019-12-11 VITALS — BP 140/72 | HR 84 | Temp 97.7°F | Resp 15 | Ht 61.0 in | Wt 150.0 lb

## 2019-12-11 DIAGNOSIS — I519 Heart disease, unspecified: Secondary | ICD-10-CM | POA: Insufficient documentation

## 2019-12-11 DIAGNOSIS — I10 Essential (primary) hypertension: Secondary | ICD-10-CM

## 2019-12-11 DIAGNOSIS — E785 Hyperlipidemia, unspecified: Secondary | ICD-10-CM

## 2019-12-11 DIAGNOSIS — Z8679 Personal history of other diseases of the circulatory system: Secondary | ICD-10-CM

## 2019-12-11 DIAGNOSIS — Z8619 Personal history of other infectious and parasitic diseases: Secondary | ICD-10-CM | POA: Insufficient documentation

## 2019-12-11 DIAGNOSIS — Z202 Contact with and (suspected) exposure to infections with a predominantly sexual mode of transmission: Secondary | ICD-10-CM

## 2019-12-11 DIAGNOSIS — Z8616 Personal history of COVID-19: Secondary | ICD-10-CM

## 2019-12-11 DIAGNOSIS — I252 Old myocardial infarction: Secondary | ICD-10-CM | POA: Insufficient documentation

## 2019-12-11 HISTORY — DX: Personal history of other infectious and parasitic diseases: Z86.19

## 2019-12-11 HISTORY — DX: Personal history of other diseases of the circulatory system: Z86.79

## 2019-12-11 MED ORDER — AMLODIPINE BESYLATE 10 MG PO TABS: 10.0000 mg | ORAL_TABLET | Freq: Every day | ORAL | 1 refills | Status: DC

## 2019-12-11 NOTE — Assessment & Plan Note (Signed)
Improved, but reports "strange sensation in lungs" Referral to Pulm and Xray ordered

## 2019-12-11 NOTE — Assessment & Plan Note (Signed)
Nicole Horn is encouraged to maintain a well balanced diet that is low in salt. Controlled-but on higher end, might benefit from a med adjustment, will wait for Cards to consult.  Additionally,she is also reminded that exercise is beneficial for heart health and control of  Blood pressure. 30-60 minutes daily is recommended-walking was suggested.

## 2019-12-11 NOTE — Assessment & Plan Note (Signed)
Updated labs and GYN referral for follow up. Will treat if needed

## 2019-12-11 NOTE — Assessment & Plan Note (Signed)
Continue Crestor, updated labs ordered Will adjust meds if needed Encourage low fat diet

## 2019-12-11 NOTE — Patient Instructions (Addendum)
I appreciate the opportunity to provide you with care for your health and wellness. Today we discussed: overall health   Follow up: 3 month Labs and Chest xray tomorrow  Referrals to GYN, Cardiologist and Pulmonologist   Please continue to practice social distancing to keep you, your family, and our community safe.  If you must go out, please wear a mask and practice good handwashing.  It was a pleasure to see you and I look forward to continuing to work together on your health and well-being. Please do not hesitate to call the office if you need care or have questions about your care.  Have a wonderful day and week. With Gratitude, Cherly Beach, DNP, AGNP-BC

## 2019-12-11 NOTE — Progress Notes (Signed)
Subjective:  Patient ID: Nicole Horn, female    DOB: 1946-08-02  Age: 74 y.o. MRN: [id number]  CC:  Chief Complaint  Patient presents with  . Cough    whenever she coughs or yawns, she gets strange feelings in her lungs. No pain, Wants xrays done and labwork done. Also wants referral to cardio to see Dr Raliegh Ip because in [place] she was followed by cardio       HPI  HPI  Nicole Horn is a 74 year old female who recently established with me this Spring of 2021 after having COVID 19, from [place] originally, she was up until last year travel back and forth for her care. Insurance recently changed and she is not having to find drs in this area. Today she is seeking referrals for cardiologist, pulmonologist, and GYN.  She reports she had an irregular HR in the past is not on anything for this. Was told she might need a pacemaker. She also reports she had a MI before age 43. She takes norvasc, plavix, crestor, HCTZ, and accupril and 81 mg asa. She denies trouble with these or S&S of bleeding.  She reports that when she coughs or yawns she gets a strange pain in her lungs, her cough has improved as well as oxygenation. Denies much need for inhalers at this time.   Additionally, she reports that she was exposed to several STI's  And has tested + for both syphilis and herpes. She would like to be retested for these and reports not having a pap post these STI's and is open to GYN follow up. Denies discharge, bleeding or pain with intercourse since she stopped seeing the gentleman who she reports passed this on to her.   Today patient denies signs and symptoms of COVID 19 infection including fever, chills, cough, shortness of breath, and headache. Past Medical, Surgical, Social History, Allergies, and Medications have been Reviewed.   Past Medical History:  Diagnosis Date  . Acute respiratory disease due to COVID-19 virus 09/10/2019  . 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  COVID-19 09/10/2019  . Diabetes mellitus without complication (HCC)    no medications, uses diet and exercise to control  . High cholesterol   . Hypertension   . Oxygen dependent 10/21/2019  . Physical deconditioning 10/21/2019  . Pneumonia due to COVID-19 virus 09/10/2019  . Sleep apnea    Pt supposed to wear CPAP, but doesn't.    Current Meds  Medication Sig  . acetaminophen (TYLENOL) 500 MG tablet Take 1,000 mg by mouth every 6 (six) hours as needed for moderate pain or headache.  . albuterol (VENTOLIN HFA) 108 (90 Base) MCG/ACT inhaler Inhale 2 puffs into the lungs every 4 (four) hours as needed for wheezing or shortness of breath.  Marland Kitchen amLODipine (NORVASC) 10 MG tablet Take 1 tablet (10 mg total) by mouth daily.  Marland Kitchen aspirin EC 81 MG tablet Take 81 mg by mouth daily.  . clopidogrel (PLAVIX) 75 MG tablet Take 75 mg by mouth daily.  Marland Kitchen dextromethorphan (DELSYM) 30 MG/5ML liquid Take by mouth as needed for cough. Prn  . hydrochlorothiazide (HYDRODIURIL) 25 MG tablet Take 25 mg by mouth daily.  . Multiple Vitamin (MULTIVITAMIN WITH MINERALS) TABS tablet Take 1 tablet by mouth daily.  . quinapril (ACCUPRIL) 20 MG tablet Take 20 mg by mouth daily.  . rosuvastatin (CRESTOR) 40 MG tablet Take 40 mg by mouth daily.  . [DISCONTINUED] amLODipine (NORVASC) 10 MG tablet  Take 1 tablet (10 mg total) by mouth daily.    ROS:  Review of Systems  Constitutional: Negative.   HENT: Negative.   Eyes: Negative.   Respiratory: Negative.        See hpi   Cardiovascular: Negative.   Gastrointestinal: Negative.   Genitourinary: Negative.   Musculoskeletal: Negative.   Skin: Negative.   Neurological: Negative.   Endo/Heme/Allergies: Negative.   Psychiatric/Behavioral: Negative.   All other systems reviewed and are negative.    Objective:   Today's Vitals: BP 140/72   Pulse 84   Temp 97.7 F (36.5 C) (Temporal)   Resp 15   Ht 5\' 1"  (1.549 m)   Wt 150 lb (68 kg)   SpO2 94%   BMI 28.34 kg/m    Vitals with BMI 12/11/2019 12/07/2019 11/13/2019  Height 5\' 1"  5\' 1"  5\' 0"   Weight 150 lbs 155 lbs 155 lbs  BMI 28.36 [id number]  Systolic [id number]  Diastolic 72 78 72  Pulse 84 74 89     Physical Exam Vitals and nursing note reviewed.  Constitutional:      Appearance: Normal appearance. She is well-developed, well-groomed and overweight.  HENT:     Head: Normocephalic and atraumatic.     Right Ear: External ear normal.     Left Ear: External ear normal.     Mouth/Throat:     Comments: Mask in place Eyes:     General:        Right eye: No discharge.        Left eye: No discharge.     Conjunctiva/sclera: Conjunctivae normal.  Cardiovascular:     Rate and Rhythm: Normal rate and regular rhythm.     Pulses: Normal pulses.     Heart sounds: Normal heart sounds.  Pulmonary:     Effort: Pulmonary effort is normal.     Breath sounds: Normal breath sounds.  Musculoskeletal:        General: Normal range of motion.     Cervical back: Normal range of motion and neck supple.  Skin:    General: Skin is warm.  Neurological:     General: No focal deficit present.     Mental Status: She is alert and oriented to person, place, and time.  Psychiatric:        Attention and Perception: Attention normal.        Mood and Affect: Mood normal.        Speech: Speech normal.        Behavior: Behavior normal. Behavior is cooperative.        Thought Content: Thought content normal.        Cognition and Memory: Cognition normal.        Judgment: Judgment normal.    Assessment   1. Essential hypertension   2. Hyperlipidemia, unspecified hyperlipidemia type   3. History of COVID-19   4. History of syphilis   5. Exposure to sexually transmitted disease (STD)   6. Heart disease   7. History of myocardial infarct at age less than 79 years   82. History of irregular heartbeat     Tests ordered Orders Placed This Encounter  Procedures  . DG Chest 2 View  . CBC  . COMPLETE METABOLIC  PANEL WITH GFR  . Hemoglobin A1c  . Lipid panel  . RPR  . HIV Antibody (routine testing w rflx)  . Ambulatory referral to Obstetrics / Gynecology  . Ambulatory referral to Pulmonology  .  Ambulatory referral to Cardiology     Plan: Please see assessment and plan per problem list above.   Meds ordered this encounter  Medications  . amLODipine (NORVASC) 10 MG tablet    Sig: Take 1 tablet (10 mg total) by mouth daily.    Dispense:  90 tablet    Refill:  1    Patient to follow-up in 03/11/2020  [name], NP

## 2019-12-11 NOTE — Assessment & Plan Note (Signed)
Referral to Cards for Hx of MI and needing a cardiologist her in [facility]

## 2019-12-12 ENCOUNTER — Ambulatory Visit (HOSPITAL_COMMUNITY)
Admission: RE | Admit: 2019-12-12 | Discharge: 2019-12-12 | Disposition: A | Discharge status: Home / Self Care | Payer: Medicare Other | Source: Ambulatory Visit | Attending: Family Medicine | Admitting: Family Medicine

## 2019-12-12 DIAGNOSIS — R7301 Impaired fasting glucose: Secondary | ICD-10-CM | POA: Diagnosis not present

## 2019-12-12 DIAGNOSIS — R0602 Shortness of breath: Secondary | ICD-10-CM | POA: Insufficient documentation

## 2019-12-12 DIAGNOSIS — Z8616 Personal history of COVID-19: Secondary | ICD-10-CM | POA: Diagnosis not present

## 2019-12-12 DIAGNOSIS — E785 Hyperlipidemia, unspecified: Secondary | ICD-10-CM | POA: Diagnosis not present

## 2019-12-12 DIAGNOSIS — R918 Other nonspecific abnormal finding of lung field: Secondary | ICD-10-CM | POA: Insufficient documentation

## 2019-12-12 DIAGNOSIS — Z8619 Personal history of other infectious and parasitic diseases: Secondary | ICD-10-CM | POA: Diagnosis not present

## 2019-12-13 ENCOUNTER — Other Ambulatory Visit: Payer: Self-pay

## 2019-12-13 ENCOUNTER — Encounter (HOSPITAL_COMMUNITY): Payer: Self-pay

## 2019-12-13 ENCOUNTER — Emergency Department (HOSPITAL_COMMUNITY)
Admission: EM | Admit: 2019-12-13 | Discharge: 2019-12-13 | Disposition: A | Discharge status: Home / Self Care | Payer: Medicare Other | Attending: Emergency Medicine | Admitting: Emergency Medicine

## 2019-12-13 DIAGNOSIS — Z7982 Long term (current) use of aspirin: Secondary | ICD-10-CM | POA: Diagnosis not present

## 2019-12-13 DIAGNOSIS — D649 Anemia, unspecified: Secondary | ICD-10-CM | POA: Diagnosis not present

## 2019-12-13 DIAGNOSIS — Z79899 Other long term (current) drug therapy: Secondary | ICD-10-CM | POA: Insufficient documentation

## 2019-12-13 DIAGNOSIS — I10 Essential (primary) hypertension: Secondary | ICD-10-CM | POA: Diagnosis not present

## 2019-12-13 DIAGNOSIS — E876 Hypokalemia: Secondary | ICD-10-CM

## 2019-12-13 DIAGNOSIS — Z7902 Long term (current) use of antithrombotics/antiplatelets: Secondary | ICD-10-CM | POA: Insufficient documentation

## 2019-12-13 DIAGNOSIS — Z8719 Personal history of other diseases of the digestive system: Secondary | ICD-10-CM

## 2019-12-13 DIAGNOSIS — Z87891 Personal history of nicotine dependence: Secondary | ICD-10-CM | POA: Diagnosis not present

## 2019-12-13 DIAGNOSIS — Z8616 Personal history of COVID-19: Secondary | ICD-10-CM | POA: Diagnosis not present

## 2019-12-13 DIAGNOSIS — K921 Melena: Secondary | ICD-10-CM | POA: Diagnosis present

## 2019-12-13 DIAGNOSIS — E119 Type 2 diabetes mellitus without complications: Secondary | ICD-10-CM | POA: Diagnosis not present

## 2019-12-13 LAB — COMPREHENSIVE METABOLIC PANEL
ALT: 30 U/L (ref 0–44)
AST: 27 U/L (ref 15–41)
Albumin: 4.1 g/dL (ref 3.5–5.0)
Alkaline Phosphatase: 57 U/L (ref 38–126)
Anion gap: 10 (ref 5–15)
BUN: 15 mg/dL (ref 8–23)
CO2: 27 mmol/L (ref 22–32)
Calcium: 9.1 mg/dL (ref 8.9–10.3)
Chloride: 106 mmol/L (ref 98–111)
Creatinine, Ser: 1.01 mg/dL — ABNORMAL HIGH (ref 0.44–1.00)
GFR calc Af Amer: >60 mL/min (ref >60)
GFR calc non Af Amer: 56 mL/min — ABNORMAL LOW (ref >60)
Glucose, Bld: 134 mg/dL — ABNORMAL HIGH (ref 70–99)
Potassium: 3 mmol/L — ABNORMAL LOW (ref 3.5–5.1)
Sodium: 143 mmol/L (ref 135–145)
Total Bilirubin: 0.5 mg/dL (ref 0.3–1.2)
Total Protein: 7.8 g/dL (ref 6.5–8.1)

## 2019-12-13 LAB — COMPLETE METABOLIC PANEL WITH GFR
AG Ratio: 1.5 (calc) (ref 1.0–2.5)
ALT: 27 U/L (ref 6–29)
AST: 26 U/L (ref 10–35)
Albumin: 4.1 g/dL (ref 3.6–5.1)
Alkaline phosphatase (APISO): 53 U/L (ref 37–153)
BUN/Creatinine Ratio: 14 (calc) (ref 6–22)
BUN: 16 mg/dL (ref 7–25)
CO2: 30 mmol/L (ref 20–32)
Calcium: 9.6 mg/dL (ref 8.6–10.4)
Chloride: 107 mmol/L (ref 98–110)
Creat: 1.17 mg/dL — ABNORMAL HIGH (ref 0.60–0.93)
GFR, Est African American: 54 mL/min/{1.73_m2} — ABNORMAL LOW (ref >=60)
GFR, Est Non African American: 47 mL/min/{1.73_m2} — ABNORMAL LOW (ref >=60)
Globulin: 2.8 g/dL (calc) (ref 1.9–3.7)
Glucose, Bld: 97 mg/dL (ref 65–99)
Potassium: 3.7 mmol/L (ref 3.5–5.3)
Sodium: 145 mmol/L (ref 135–146)
Total Bilirubin: 0.4 mg/dL (ref 0.2–1.2)
Total Protein: 6.9 g/dL (ref 6.1–8.1)

## 2019-12-13 LAB — TYPE AND SCREEN
ABO/RH(D): B POS
Antibody Screen: NEGATIVE

## 2019-12-13 LAB — CBC
HCT: 30.5 % — ABNORMAL LOW (ref 35.0–45.0)
HCT: 31.6 % — ABNORMAL LOW (ref 36.0–46.0)
Hemoglobin: 8.7 g/dL — ABNORMAL LOW (ref 11.7–15.5)
Hemoglobin: 9.2 g/dL — ABNORMAL LOW (ref 12.0–15.0)
MCH: 22.1 pg — ABNORMAL LOW (ref 27.0–33.0)
MCH: 22.9 pg — ABNORMAL LOW (ref 26.0–34.0)
MCHC: 28.5 g/dL — ABNORMAL LOW (ref 32.0–36.0)
MCHC: 29.1 g/dL — ABNORMAL LOW (ref 30.0–36.0)
MCV: 77.4 fL — ABNORMAL LOW (ref 80.0–100.0)
MCV: 78.6 fL — ABNORMAL LOW (ref 80.0–100.0)
MPV: 10.4 fL (ref 7.5–12.5)
Platelets: 433 10*3/uL — ABNORMAL HIGH (ref 140–400)
Platelets: 428 10*3/uL — ABNORMAL HIGH (ref 150–400)
RBC: 3.94 10*6/uL (ref 3.80–5.10)
RBC: 4.02 MIL/uL (ref 3.87–5.11)
RDW: 15.3 % — ABNORMAL HIGH (ref 11.0–15.0)
RDW: 16 % — ABNORMAL HIGH (ref 11.5–15.5)
WBC: 4.3 10*3/uL (ref 3.8–10.8)
WBC: 5.9 10*3/uL (ref 4.0–10.5)
nRBC: 0 % (ref 0.0–0.2)

## 2019-12-13 LAB — RPR: RPR Ser Ql: REACTIVE — AB

## 2019-12-13 LAB — HEMOGLOBIN A1C
Hgb A1c MFr Bld: 5.7 % of total Hgb — ABNORMAL HIGH (ref <5.7)
Mean Plasma Glucose: 117 (calc)
eAG (mmol/L): 6.5 (calc)

## 2019-12-13 LAB — LIPID PANEL
Cholesterol: 106 mg/dL (ref <200)
HDL: 46 mg/dL — ABNORMAL LOW (ref >=50)
LDL Cholesterol (Calc): 43 mg/dL (calc)
Non-HDL Cholesterol (Calc): 60 mg/dL (calc) (ref <130)
Total CHOL/HDL Ratio: 2.3 (calc) (ref <5.0)
Triglycerides: 85 mg/dL (ref <150)

## 2019-12-13 LAB — RPR TITER: RPR Titer: 1:1 {titer} — ABNORMAL HIGH

## 2019-12-13 LAB — POC OCCULT BLOOD, ED: Fecal Occult Bld: NEGATIVE

## 2019-12-13 LAB — FLUORESCENT TREPONEMAL AB(FTA)-IGG-BLD: Fluorescent Treponemal ABS: NONREACTIVE

## 2019-12-13 LAB — HIV ANTIBODY (ROUTINE TESTING W REFLEX): HIV 1&2 Ab, 4th Generation: NONREACTIVE

## 2019-12-13 MED ORDER — POTASSIUM CHLORIDE CRYS ER 20 MEQ PO TBCR: 20.0000 meq | EXTENDED_RELEASE_TABLET | Freq: Two times a day (BID) | ORAL | 0 refills | Status: DC

## 2019-12-13 MED ORDER — POTASSIUM CHLORIDE CRYS ER 20 MEQ PO TBCR
40.0000 meq | EXTENDED_RELEASE_TABLET | Freq: Once | ORAL | Status: AC
  Administered 2019-12-13: 40 meq via ORAL
  Filled 2019-12-13: qty 2

## 2019-12-13 NOTE — Discharge Instructions (Signed)
Take the potassium pills as directed.  Chemical test on your stool today was not showing any evidence of blood.  However you are anemic but not in the zone needing blood transfusion.  Your hemoglobin was greater than 7 and is actually was 9.2 today.  Call gastroenterology for follow-up.  Make an appointment to follow back up with your primary care doctor to have your potassium rechecked.  Return for any new or worse symptoms.  Return for any red blood in your bowel movements per tickly if it occurs twice a day.  Continue your iron pills.

## 2019-12-13 NOTE — ED Provider Notes (Signed)
[facility] EMERGENCY DEPARTMENT Provider Note   CSN: [id number] Arrival date & time: 12/13/19  1524     History Chief Complaint  Patient presents with  . Melena    Nicole Horn is a 74 y.o. female.  Alert here patient sent in from her primary care office.  She was seen there yesterday.  Patient talks about having black stools for about 3 weeks.  Patient is on iron supplementation.  But has not been on it for about a week.  They got lab work done yesterday her hemoglobin came in at 8.7.  Patient without any abdominal pain no red blood in her bowel movements no vomiting.  Patient is was placed on the iron because of the anemia.  Patient's had a colonoscopy a few years ago.  [place]  [place]        Past Medical History:  Diagnosis Date  . Acute respiratory disease due to COVID-19 virus 09/10/2019  . 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 COVID-19 09/10/2019  . Diabetes mellitus without complication (HCC)    no medications, uses diet and exercise to control  . High cholesterol   . Hypertension   . Oxygen dependent 10/21/2019  . Physical deconditioning 10/21/2019  . Pneumonia due to COVID-19 virus 09/10/2019  . Sleep apnea    Pt supposed to wear CPAP, but doesn't.    Patient Active Problem List   Diagnosis Date Noted  . History of COVID-19 12/11/2019  . History of syphilis 12/11/2019  . Exposure to sexually transmitted disease (STD) 12/11/2019  . Heart disease 12/11/2019  . History of myocardial infarct at age less than 78 years 12/11/2019  . History of irregular heartbeat 12/11/2019  . Cough 11/13/2019  . Chronic thumb pain, right 11/13/2019  . Obesity (BMI 30-39.9) 10/16/2019  . HTN (hypertension) 09/10/2019  . Hyperlipemia 09/10/2019    Past Surgical History:  Procedure Laterality Date  . ABDOMINAL HYSTERECTOMY    . BACK SURGERY    . BARTHOLIN GLAND CYST EXCISION Left 03/04/2015   Procedure: EXCISION OF LEFT BARTHOLINS  TUMOR;  Surgeon: [name], MD;  Location: [facility];  Service: Gynecology;  Laterality: Left;  . CERVICAL SPINE SURGERY    . CYST REMOVAL TRUNK    . multiple cyst removal surgeries       OB History    Gravida      Para      Term      Preterm      AB      Living  1     SAB      TAB      Ectopic      Multiple      Live Births              Family History  Problem Relation Age of Onset  . Cancer Mother        colon  . Cancer Sister        breast  . Cancer Maternal Aunt        stomach    Social History   Tobacco Use  . Smoking status: Former Smoker    Packs/day: 2.00    Years: 25.00    Pack years: 50.00    Types: Cigarettes  . Smokeless tobacco: Never Used  Substance Use Topics  . Alcohol use: Not Currently    Alcohol/week: 0.0 standard drinks    Comment: socially  . Drug use: No  Home Medications Prior to Admission medications   Medication Sig Start Date End Date Taking? Authorizing Provider  acetaminophen (TYLENOL) 500 MG tablet Take 1,000 mg by mouth every 6 (six) hours as needed for moderate pain or headache.    [provider]  albuterol (VENTOLIN HFA) 108 (90 Base) MCG/ACT inhaler Inhale 2 puffs into the lungs every 4 (four) hours as needed for wheezing or shortness of breath.    [provider]  amLODipine (NORVASC) 10 MG tablet Take 1 tablet (10 mg total) by mouth daily. 12/11/19   [name], NP  aspirin EC 81 MG tablet Take 81 mg by mouth daily.    [provider]  clopidogrel (PLAVIX) 75 MG tablet Take 75 mg by mouth daily.    [provider]  dextromethorphan (DELSYM) 30 MG/5ML liquid Take by mouth as needed for cough. Prn    [provider]  hydrochlorothiazide (HYDRODIURIL) 25 MG tablet Take 25 mg by mouth daily.    [provider]  Multiple Vitamin (MULTIVITAMIN WITH MINERALS) TABS tablet Take 1 tablet by mouth daily.    [provider]  potassium chloride SA  (KLOR-CON) 20 MEQ tablet Take 1 tablet (20 mEq total) by mouth 2 (two) times daily for 4 days. 12/13/19 12/17/19  [name], MD  quinapril (ACCUPRIL) 20 MG tablet Take 20 mg by mouth daily.    [provider]  rosuvastatin (CRESTOR) 40 MG tablet Take 40 mg by mouth daily.    [provider]    Allergies    Patient has no known allergies.  Review of Systems   Review of Systems  Constitutional: Negative for chills and fever.  HENT: Negative for rhinorrhea and sore throat.   Eyes: Negative for visual disturbance.  Respiratory: Negative for cough and shortness of breath.   Cardiovascular: Negative for chest pain and leg swelling.  Gastrointestinal: Negative for abdominal pain, diarrhea, nausea and vomiting.  Genitourinary: Negative for dysuria.  Musculoskeletal: Negative for back pain and neck pain.  Skin: Negative for rash.  Neurological: Negative for dizziness, light-headedness and headaches.  Hematological: Does not bruise/bleed easily.  Psychiatric/Behavioral: Negative for confusion.    Physical Exam Updated Vital Signs BP (!) 148/53   Pulse 66   Temp 97.8 F (36.6 C) (Oral)   Resp 16   Ht 1.549 m (5\' 1" )   Wt 68 kg   SpO2 96%   BMI 28.33 kg/m   Physical Exam Vitals and nursing note reviewed.  Constitutional:      General: She is not in acute distress.    Appearance: Normal appearance. She is well-developed.  HENT:     Head: Normocephalic and atraumatic.  Eyes:     Extraocular Movements: Extraocular movements intact.     Conjunctiva/sclera: Conjunctivae normal.     Pupils: Pupils are equal, round, and reactive to light.  Cardiovascular:     Rate and Rhythm: Normal rate and regular rhythm.     Heart sounds: No murmur.  Pulmonary:     Effort: Pulmonary effort is normal. No respiratory distress.     Breath sounds: Normal breath sounds.  Abdominal:     Palpations: Abdomen is soft.     Tenderness: There is no abdominal tenderness.   Genitourinary:    Rectum: Guaiac result positive.     Comments: Rectal exam and some external skin tags no prolapsed internal hemorrhoids no external hemorrhoids.  No fissure.  Stool was brownish in color Hemoccult negative.  Although just a  small amount of stool was obtained. Musculoskeletal:     Cervical back: Normal range of motion and neck supple.  Skin:    General: Skin is warm and dry.     Capillary Refill: Capillary refill takes less than 2 seconds.  Neurological:     General: No focal deficit present.     Mental Status: She is alert and oriented to person, place, and time.     ED Results / Procedures / Treatments   Labs (all labs ordered are listed, but only abnormal results are displayed) Labs Reviewed  COMPREHENSIVE METABOLIC PANEL - Abnormal; Notable for the following components:      Result Value   Potassium 3.0 (*)    Glucose, Bld 134 (*)    Creatinine, Ser 1.01 (*)    GFR calc non Af Amer 56 (*)    All other components within normal limits  CBC - Abnormal; Notable for the following components:   Hemoglobin 9.2 (*)    HCT 31.6 (*)    MCV 78.6 (*)    MCH 22.9 (*)    MCHC 29.1 (*)    RDW 16.0 (*)    Platelets 428 (*)    All other components within normal limits  POC OCCULT BLOOD, ED  POC OCCULT BLOOD, ED  TYPE AND SCREEN    EKG None  Radiology DG Chest 2 View  Result Date: 12/12/2019 CLINICAL DATA:  Shortness of breath. EXAM: CHEST - 2 VIEW COMPARISON:  10/05/2019 FINDINGS: Stable cardiomediastinal contours. Atherosclerotic calcification of the aortic knob. Persistent but improving opacities within the bilateral lung bases and peripheral aspect of the left mid lung. No pleural effusion or pneumothorax. IMPRESSION: Persistent but improving bilateral pulmonary infiltrates. Electronically Signed   By: Davina Poke D.O.   On: 12/12/2019 16:40    Procedures Procedures (including critical care time)  Medications Ordered in ED Medications  potassium  chloride SA (KLOR-CON) CR tablet 40 mEq (40 mEq Oral Given 12/13/19 1852)    ED Course  I have reviewed the triage vital signs and the nursing notes.  Pertinent labs & imaging results that were available during my care of the patient were reviewed by me and considered in my medical decision making (see chart for details).    MDM Rules/Calculators/A&P                      Patient with a history consistent with melena concerning obviously for GI blood loss particularly with the anemia.  Patient on iron pills due to this anemia.  But ran out of them about a week ago.  Patient denies any red blood in her bowel movements any abdominal pain or any vomiting of blood.  Her hemoglobin here today was 9.2.  So not in the threshold for blood transfusion.  Hemoccult was negative.  But will refer her back to her primary care doctor as well as her GI doctor.  Potassium was a little low at 3.0.  Patient given 40 mEq potassium here and will be discharged home with 20 mEq twice a day for 4 days and follow-up with her primary care doctor.  Patient nontoxic no acute distress. Final Clinical Impression(s) / ED Diagnoses Final diagnoses:  History of melena  Anemia, unspecified type  Hypokalemia    Rx / DC Orders ED Discharge Orders         Ordered    potassium chloride SA (KLOR-CON) 20 MEQ tablet  2 times daily  12/13/19 1921           [name], MD 12/13/19 [phone]

## 2019-12-13 NOTE — ED Triage Notes (Signed)
Pt presenting to ER after receiving call from provider stating her blood count was low.   Pt admits to having dark, tarry stools x3 weeks.   Pt denies any other symptoms.

## 2019-12-17 ENCOUNTER — Telehealth: Payer: Self-pay | Admitting: Internal Medicine

## 2019-12-17 NOTE — Telephone Encounter (Signed)
HOSPITAL REFERRAL

## 2019-12-17 NOTE — Telephone Encounter (Signed)
Ok to schedule ov.  

## 2019-12-28 ENCOUNTER — Telehealth: Payer: Self-pay | Admitting: Obstetrics and Gynecology

## 2019-12-28 NOTE — Telephone Encounter (Signed)

## 2019-12-31 ENCOUNTER — Telehealth: Payer: Self-pay | Admitting: *Deleted

## 2019-12-31 ENCOUNTER — Encounter: Payer: Medicare Other | Admitting: Obstetrics and Gynecology

## 2019-12-31 NOTE — Telephone Encounter (Signed)
Pt consented to a virtual visit. 

## 2019-12-31 NOTE — Telephone Encounter (Signed)
Nicole Horn, you are scheduled for a virtual visit with your provider today.  Just as we do with appointments in the office, we must obtain your consent to participate.  Your consent will be active for this visit and any virtual visit you may have with one of our providers in the next 365 days.  If you have a MyChart account, I can also send a copy of this consent to you electronically.  All virtual visits are billed to your insurance company just like a traditional visit in the office.  As this is a virtual visit, video technology does not allow for your provider to perform a traditional examination.  This may limit your provider's ability to fully assess your condition.  If your provider identifies any concerns that need to be evaluated in person or the need to arrange testing such as labs, EKG, etc, we will make arrangements to do so.  Although advances in technology are sophisticated, we cannot ensure that it will always work on either your end or our end.  If the connection with a video visit is poor, we may have to switch to a telephone visit.  With either a video or telephone visit, we are not always able to ensure that we have a secure connection.   I need to obtain your verbal consent now.   Are you willing to proceed with your visit today?

## 2020-01-01 ENCOUNTER — Other Ambulatory Visit: Payer: Self-pay

## 2020-01-01 ENCOUNTER — Telehealth (INDEPENDENT_AMBULATORY_CARE_PROVIDER_SITE_OTHER): Payer: Medicare Other | Admitting: Gastroenterology

## 2020-01-01 ENCOUNTER — Encounter: Payer: Self-pay | Admitting: Gastroenterology

## 2020-01-01 DIAGNOSIS — R195 Other fecal abnormalities: Secondary | ICD-10-CM

## 2020-01-01 NOTE — Progress Notes (Signed)
Patient could not complete visit as she had no video. She was brought in next day for office visit.

## 2020-01-02 ENCOUNTER — Other Ambulatory Visit: Payer: Self-pay | Admitting: *Deleted

## 2020-01-02 ENCOUNTER — Encounter: Payer: Self-pay | Admitting: Gastroenterology

## 2020-01-02 ENCOUNTER — Other Ambulatory Visit: Payer: Self-pay

## 2020-01-02 ENCOUNTER — Encounter: Payer: Self-pay | Admitting: *Deleted

## 2020-01-02 ENCOUNTER — Ambulatory Visit (INDEPENDENT_AMBULATORY_CARE_PROVIDER_SITE_OTHER): Payer: Medicare Other | Admitting: Gastroenterology

## 2020-01-02 VITALS — BP 123/58 | HR 88 | Temp 97.1°F | Ht 60.0 in | Wt 152.0 lb

## 2020-01-02 DIAGNOSIS — D509 Iron deficiency anemia, unspecified: Secondary | ICD-10-CM | POA: Diagnosis not present

## 2020-01-02 DIAGNOSIS — K625 Hemorrhage of anus and rectum: Secondary | ICD-10-CM

## 2020-01-02 DIAGNOSIS — Z860101 Personal history of adenomatous and serrated colon polyps: Secondary | ICD-10-CM

## 2020-01-02 DIAGNOSIS — Z86010 Personal history of colonic polyps: Secondary | ICD-10-CM | POA: Insufficient documentation

## 2020-01-02 DIAGNOSIS — Z800 Family history of malignant neoplasm of digestive organs: Secondary | ICD-10-CM

## 2020-01-02 MED ORDER — CLENPIQ 10-3.5-12 MG-GM -GM/160ML PO SOLN: 1.0000 | Freq: Once | ORAL | 0 refills | Status: AC

## 2020-01-02 NOTE — Patient Instructions (Signed)
Please have your labs done as discussed. We will contact you with results as available.   Colonoscopy as scheduled. See separate instructions.   You can resume iron, please note your stool may turn dark on iron. YOU WILL HAVE TO HOLD FOR 7 DAYS PRIOR TO YOUR COLONOSCOPY.  Call if you have any questions or concerns in the interim.

## 2020-01-02 NOTE — Progress Notes (Signed)
Primary Care Physician:  [name], NP  Primary Gastroenterologist:  Garfield Cornea, MD   Chief Complaint  Patient presents with  . dark stool    Seen in er 4/22. was light for about 2 days but now dark again. Eating greens and veggies. No blood in stool as of now. No iron tabs in about 2 weeks    HPI:  Nicole Horn is a 73 y.o. female here for further evaluation of microcytic anemia and blood in the stool.   Recently seen in the ED here on December 13, 2019 with complaints of several week history of black stools.  Patient reports being placed on iron because of anemia but she felt like her stools were darker than usual.  Had also been out of iron for about a week.  Also had noted a little faint amount of bright red blood per rectum.  In the ED she was heme negative brown stool on exam.  BUN 15, hemoglobin 9.2, hematocrit 31.6, MCV 78.6.  Back in January her hemoglobin was 11 when admitted with Covid.  At time of discharge her hemoglobin was 9.9.  Lost 16 pounds while sick with Covid in January.  Has gained 6 pounds back.  Usually stools are bristol 4 but some bristol 1 lately. BM daily. Normally no abdominal pain but two days ago was having little lower abdominal pain. No dysuria. Some nausea the other day but no vomiting. No heartburn. No dysphagia.   [place] had [place] and was told come back in 5 years.  No prior EGD.  She is on aspirin and Plavix chronically for history of coronary artery stents placed remotely.  Denies any NSAID use.  Mother had colon cancer in her 90s.  Current Outpatient Medications  Medication Sig Dispense Refill  . acetaminophen (TYLENOL) 500 MG tablet Take 1,000 mg by mouth every 6 (six) hours as needed for moderate pain or headache.    . albuterol (VENTOLIN HFA) 108 (90 Base) MCG/ACT inhaler Inhale 2 puffs into the lungs every 4 (four) hours as needed for wheezing or shortness of breath.    Marland Kitchen amLODipine  (NORVASC) 10 MG tablet Take 1 tablet (10 mg total) by mouth daily. 90 tablet 1  . aspirin EC 81 MG tablet Take 81 mg by mouth daily.    . clopidogrel (PLAVIX) 75 MG tablet Take 75 mg by mouth daily.    Marland Kitchen dextromethorphan (DELSYM) 30 MG/5ML liquid Take by mouth as needed for cough. Prn    . hydrochlorothiazide (HYDRODIURIL) 25 MG tablet Take 25 mg by mouth daily.    . Multiple Vitamin (MULTIVITAMIN WITH MINERALS) TABS tablet Take 1 tablet by mouth daily.    . quinapril (ACCUPRIL) 20 MG tablet Take 20 mg by mouth daily.    . rosuvastatin (CRESTOR) 40 MG tablet Take 40 mg by mouth daily.     No current facility-administered medications for this visit.    Allergies as of 01/02/2020  . (No Known Allergies)    Past Medical History:  Diagnosis Date  . Acute respiratory disease due to COVID-19 virus 09/10/2019  . 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 COVID-19 09/10/2019  . Diabetes mellitus without complication (HCC)    no medications, uses diet and exercise to control  . High cholesterol   . Hypertension   . Oxygen dependent 10/21/2019  . Physical deconditioning 10/21/2019  . Pneumonia due to COVID-19 virus 09/10/2019  . Sleep apnea  Pt supposed to wear CPAP, but doesn't.    Past Surgical History:  Procedure Laterality Date  . ABDOMINAL HYSTERECTOMY    . BACK SURGERY    . BARTHOLIN GLAND CYST EXCISION Left 03/04/2015   Procedure: EXCISION OF LEFT BARTHOLINS TUMOR;  Surgeon: [name], MD;  Location: [facility];  Service: Gynecology;  Laterality: Left;  . CERVICAL SPINE SURGERY    . CYST REMOVAL TRUNK    . multiple cyst removal surgeries      Family History  Problem Relation Age of Onset  . Cancer Mother        colon  . Cancer Sister        breast  . Cancer Maternal Aunt        stomach    Social History   Socioeconomic History  . Marital status: Widowed    Spouse name: Not on file  . Number of children: 1  . Years of education: Not on file  . Highest  education level: Some college, no degree  Occupational History  . Not on file  Tobacco Use  . Smoking status: Former Smoker    Packs/day: 2.00    Years: 25.00    Pack years: 50.00    Types: Cigarettes  . Smokeless tobacco: Never Used  Substance and Sexual Activity  . Alcohol use: Not Currently    Alcohol/week: 0.0 standard drinks    Comment: socially  . Drug use: No  . Sexual activity: Not Currently  Other Topics Concern  . Not on file  Social History Narrative   Lives in [place] and [place] months here and [place] owns an apt in [place] alone, but helps to care for mother who is 75 years (goes between the kids)   Sister is in [place] is in [place]      Son who is 56 years old lives in [place], [place]    Has 6 grand kids and 7 great grands      Enjoys exercise (harder since COVID)-dancing      Diet: eats all foods-enjoys smoothies, avoids fried foods, limited red meat if any   Caffeine: coffee 1 cup in the morning-sometimes will have 1 in evening    Water: 3-4 bottles daily       Wears seat belt   Smoke detectors in home   Does not use phone while driving            Social Determinants of Health   Financial Resource Strain: Low Risk   . Difficulty of Paying Living Expenses: Not very hard  Food Insecurity: No Food Insecurity  . Worried About Charity fundraiser in the Last Year: Never true  . Ran Out of Food in the Last Year: Never true  Transportation Needs: No Transportation Needs  . Lack of Transportation (Medical): No  . Lack of Transportation (Non-Medical): No  Physical Activity: Inactive  . Days of Exercise per Week: 0 days  . Minutes of Exercise per Session: 0 min  Stress: No Stress Concern Present  . Feeling of Stress : Not at all  Social Connections: Moderately Isolated  . Frequency of Communication with Friends and Family: More than three times a week  . Frequency of Social Gatherings with Friends and Family: More than three  times a week  . Attends Religious Services: Never  . Active Member of Clubs or Organizations: No  . Attends Archivist Meetings: Never  . Marital  Status: Widowed  Intimate Partner Violence: Not At Risk  . Fear of Current or Ex-Partner: No  . Emotionally Abused: No  . Physically Abused: No  . Sexually Abused: No      ROS:  General: Negative for anorexia, weight loss, fever, chills, fatigue, weakness. Eyes: Negative for vision changes.  ENT: Negative for hoarseness, difficulty swallowing , nasal congestion. CV: Negative for chest pain, angina, palpitations, dyspnea on exertion, peripheral edema.  Respiratory: Negative for dyspnea at rest, dyspnea on exertion, cough, sputum, wheezing.  GI: See history of present illness. GU:  Negative for dysuria, hematuria, urinary incontinence, urinary frequency, nocturnal urination.  MS: Negative for joint pain, low back pain.  Derm: Negative for rash or itching.  Neuro: Negative for weakness, abnormal sensation, seizure, frequent headaches, memory loss, confusion.  Psych: Negative for anxiety, depression, suicidal ideation, hallucinations.  Endo: Negative for unusual weight change.  Heme: Negative for bruising or bleeding. Allergy: Negative for rash or hives.    Physical Examination:  BP (!) 123/58   Pulse 88   Temp (!) 97.1 F (36.2 C) (Oral)   Ht 5' (1.524 m)   Wt 152 lb (68.9 kg)   BMI 29.69 kg/m    General: Well-nourished, well-developed in no acute distress.  Head: Normocephalic, atraumatic.   Eyes: Conjunctiva pink, no icterus. Mouth: masked Neck: Supple without thyromegaly, masses, or lymphadenopathy.  Lungs: Clear to auscultation bilaterally.  Heart: Regular rate and rhythm, no murmurs rubs or gallops.  Abdomen: Bowel sounds are normal, nontender, nondistended, no hepatosplenomegaly or masses, no abdominal bruits or    hernia , no rebound or guarding.   Rectal: Not performed Extremities: No lower extremity edema.  No clubbing or deformities.  Neuro: Alert and oriented x 4 , grossly normal neurologically.  Skin: Warm and dry, no rash or jaundice.   Psych: Alert and cooperative, normal mood and affect.  Labs: Lab Results  Component Value Date   CREATININE 1.01 (H) 12/13/2019   BUN 15 12/13/2019   NA 143 12/13/2019   K 3.0 (L) 12/13/2019   CL 106 12/13/2019   CO2 27 12/13/2019   Lab Results  Component Value Date   WBC 5.9 12/13/2019   HGB 9.2 (L) 12/13/2019   HCT 31.6 (L) 12/13/2019   MCV 78.6 (L) 12/13/2019   PLT 428 (H) 12/13/2019   Lab Results  Component Value Date   ALT 30 12/13/2019   AST 27 12/13/2019   ALKPHOS 57 12/13/2019   BILITOT 0.5 12/13/2019   Lab Results  Component Value Date   FERRITIN 34 09/15/2019   No results found for: VITAMINB12 No results found for: FOLATE  Lab Results  Component Value Date   HGBA1C 5.7 (H) 12/12/2019    Imaging Studies: DG Chest 2 View  Result Date: 12/12/2019 CLINICAL DATA:  Shortness of breath. EXAM: CHEST - 2 VIEW COMPARISON:  10/05/2019 FINDINGS: Stable cardiomediastinal contours. Atherosclerotic calcification of the aortic knob. Persistent but improving opacities within the bilateral lung bases and peripheral aspect of the left mid lung. No pleural effusion or pneumothorax. IMPRESSION: Persistent but improving bilateral pulmonary infiltrates. Electronically Signed   By: Davina Poke D.O.   On: 12/12/2019 16:40    Impression/plan:  Pleasant 74 year old female presenting for further evaluation of microcytic anemia (low normal ferritin), reported black stools in the setting of iron, rectal bleeding, history of colon polyps.  Anemia first noted during hospitalization for Covid.  Overall H&H has been stable since discharge in January but no improvement with  oral iron.  ED evaluation recently for black stools, ambiguous documentation both reporting heme positive and heme-negative stool on exam although Hemoccult performed in the lab was  negative.  Given microcytic anemia, history of colon polyps, rectal bleeding would pursue colonoscopy as next step.  At this time it is not clear that she had true melena.  I have discussed the risks, alternatives, benefits with regards to but not limited to the risk of reaction to medication, bleeding, infection, perforation and the patient is agreeable to proceed. Written consent to be obtained.  We will update labs.  She will resume iron on I will hold for 1 week prior to colonoscopy.

## 2020-01-09 DIAGNOSIS — K625 Hemorrhage of anus and rectum: Secondary | ICD-10-CM | POA: Insufficient documentation

## 2020-01-11 ENCOUNTER — Telehealth: Payer: Self-pay | Admitting: Obstetrics and Gynecology

## 2020-01-11 NOTE — Telephone Encounter (Signed)

## 2020-01-14 ENCOUNTER — Ambulatory Visit: Payer: Medicare Other | Admitting: Obstetrics and Gynecology

## 2020-01-14 ENCOUNTER — Encounter: Payer: Self-pay | Admitting: Obstetrics and Gynecology

## 2020-01-14 ENCOUNTER — Other Ambulatory Visit (HOSPITAL_COMMUNITY)
Admission: RE | Admit: 2020-01-14 | Discharge: 2020-01-14 | Disposition: A | Discharge status: Home / Self Care | Payer: Medicare Other | Source: Ambulatory Visit | Attending: Obstetrics and Gynecology | Admitting: Obstetrics and Gynecology

## 2020-01-14 ENCOUNTER — Encounter: Payer: Medicare Other | Admitting: Obstetrics and Gynecology

## 2020-01-14 VITALS — BP 152/65 | HR 97 | Wt 152.0 lb

## 2020-01-14 DIAGNOSIS — Z113 Encounter for screening for infections with a predominantly sexual mode of transmission: Secondary | ICD-10-CM | POA: Insufficient documentation

## 2020-01-14 NOTE — Progress Notes (Signed)
PATIENT ID: Nicole Horn, female     DOB: 09/11/1945, 74 y.o.     MRN: [id number]   [facility] Visit  01/14/20     PATIENT NAME: Nicole Horn     MRN [id number]     DOB: Jul 20, 1946  CC & HPI:  Nicole Horn is a 74 y.o. female presenting today for a routine physical exam and STI check.  She notes that she is just planning on being cautious with her testing today. She notes some mild discomfort in her external genitalia, but denies itching or irritation. She notes she had a recent partner that didn't want to use their condom properly. She does self breast exams and denies any new lumps, bumps, or changes.   She was diagnosed with COVID-19 in Jan 2021 and was in intensive care for a week.   ROS:  Review of Systems  Constitutional: Negative for chills, diaphoresis, fever, malaise/fatigue and weight loss.  HENT: Negative for congestion and sore throat.   Eyes: Negative for blurred vision and double vision.  Respiratory: Negative for cough and shortness of breath.   Cardiovascular: Negative for chest pain, palpitations and leg swelling.  Gastrointestinal: Negative for constipation, diarrhea, heartburn, nausea and vomiting.  Genitourinary: Negative for frequency and urgency.  Musculoskeletal: Negative for back pain, falls and myalgias.  Skin: Negative for rash.  Neurological: Negative for dizziness, sensory change, weakness and headaches.  Endo/Heme/Allergies: Does not bruise/bleed easily.  Psychiatric/Behavioral: Negative for depression. The patient is not nervous/anxious.     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                              Surgical Hx:    Past Surgical History:  Procedure Laterality Date   ABDOMINAL HYSTERECTOMY     BACK SURGERY     BARTHOLIN GLAND CYST EXCISION Left 03/04/2015   Procedure: EXCISION OF LEFT BARTHOLINS TUMOR;  Surgeon: [name], MD;  Location: [facility];  Service: Gynecology;  Laterality: Left;   CERVICAL SPINE SURGERY     CYST REMOVAL TRUNK     multiple cyst removal surgeries     stent of lower extremities     Medications: Reviewed & Updated - see associated section                       Current Outpatient Medications:    amLODipine (NORVASC) 10 MG tablet, Take 1 tablet (10 mg total) by mouth daily., Disp: 90 tablet, Rfl: 1   aspirin EC 81 MG tablet, Take 81 mg by mouth daily., Disp: , Rfl:    clopidogrel (PLAVIX) 75 MG tablet, Take 75 mg by mouth daily., Disp: , Rfl:    dextromethorphan (DELSYM) 30 MG/5ML liquid, Take by mouth as needed for cough. Prn, Disp: , Rfl:  hydrochlorothiazide (HYDRODIURIL) 25 MG tablet, Take 25 mg by mouth daily., Disp: , Rfl:    Multiple Vitamin (MULTIVITAMIN WITH MINERALS) TABS tablet, Take 1 tablet by mouth daily., Disp: , Rfl:    quinapril (ACCUPRIL) 20 MG tablet, Take 20 mg by mouth daily., Disp: , Rfl:    acetaminophen (TYLENOL) 500 MG tablet, Take 1,000 mg by mouth every 6 (six) hours as needed for moderate pain or headache., Disp: , Rfl:    albuterol (VENTOLIN HFA) 108 (90 Base) MCG/ACT inhaler, Inhale 2 puffs into the lungs every 4 (four) hours as needed for wheezing or shortness of breath., Disp: , Rfl:    rosuvastatin (CRESTOR) 40 MG tablet, Take 40 mg by mouth daily., Disp: , Rfl:    Social History: Reviewed -  reports that she has quit smoking. Her  smoking use included cigarettes. She has a 50.00 pack-year smoking history. She has never used smokeless tobacco.  Objective Findings:  Vitals: Blood pressure (!) 152/65, pulse 97, weight 152 lb (68.9 kg).  PHYSICAL EXAMINATION General appearance - alert, well appearing, and in no distress, oriented to person, place, and time, normal appearing weight and well hydrated Mental status - alert, oriented to person, place, and time, normal mood, behavior, speech, dress, motor activity, and thought processes, affect appropriate to mood Chest - not examined Heart - not examined Abdomen - not examined Breasts - risk and benefit of breast self-exam was discussed, not examined Skin - normal coloration and turgor, no rashes, no suspicious skin lesions noted  PELVIC External genitalia -normal for age Vulva -normal Vagina -moderate vaginal shortening Cervix - surgically absent Uterus - surgically absent Adnexa -nontender [place] -results pending Rectal - normal rectal, no masses, guaiac negative stool obtained  Assessment & Plan:   A:  1. STI screen  P:  1.  Repeat testing in 3 months for HIV, follow-up GC chlamydia trichomonas, HPV   By signing my name below, I, [employer], attest that this documentation has been prepared under the direction and in the presence of [name], MD. Electronically Signed: Kay. 01/14/20. 4:14 PM.  I personally performed the services described in this documentation, which was SCRIBED in my presence. The recorded information has been reviewed and considered accurate. It has been edited as necessary during review. [name], MD

## 2020-01-15 LAB — HIV ANTIBODY (ROUTINE TESTING W REFLEX): HIV Screen 4th Generation wRfx: NONREACTIVE

## 2020-01-15 NOTE — Progress Notes (Signed)
HIV testing negative

## 2020-01-16 LAB — CERVICOVAGINAL ANCILLARY ONLY
Chlamydia: NEGATIVE
Comment: NEGATIVE
Comment: NEGATIVE
Comment: NORMAL
Neisseria Gonorrhea: NEGATIVE
Trichomonas: NEGATIVE

## 2020-01-24 ENCOUNTER — Telehealth: Payer: Self-pay | Admitting: Internal Medicine

## 2020-01-24 NOTE — Telephone Encounter (Signed)
Miralax instructions placed at front desk for pt to pickup. Called and informed pt.

## 2020-01-24 NOTE — Telephone Encounter (Signed)
PATIENT CALLED AND SAID THAT SHE CAN NOT AFFORD THE PREP THAT WAS SENT IN AND WOULD [place] AN ALTERNATIVE    [phone]

## 2020-01-29 ENCOUNTER — Other Ambulatory Visit: Payer: Self-pay

## 2020-01-29 ENCOUNTER — Other Ambulatory Visit: Payer: Self-pay | Admitting: *Deleted

## 2020-01-29 ENCOUNTER — Telehealth: Payer: Self-pay

## 2020-01-29 ENCOUNTER — Encounter: Payer: Self-pay | Admitting: Cardiology

## 2020-01-29 ENCOUNTER — Ambulatory Visit (INDEPENDENT_AMBULATORY_CARE_PROVIDER_SITE_OTHER): Payer: Medicare Other | Admitting: Cardiology

## 2020-01-29 VITALS — BP 120/54 | HR 84 | Ht 60.0 in | Wt 152.0 lb

## 2020-01-29 DIAGNOSIS — I25119 Atherosclerotic heart disease of native coronary artery with unspecified angina pectoris: Secondary | ICD-10-CM

## 2020-01-29 DIAGNOSIS — I739 Peripheral vascular disease, unspecified: Secondary | ICD-10-CM | POA: Diagnosis not present

## 2020-01-29 DIAGNOSIS — E782 Mixed hyperlipidemia: Secondary | ICD-10-CM | POA: Diagnosis not present

## 2020-01-29 MED ORDER — ROSUVASTATIN CALCIUM 40 MG PO TABS: 40.0000 mg | ORAL_TABLET | Freq: Every day | ORAL | 0 refills | Status: DC

## 2020-01-29 MED ORDER — AMLODIPINE BESYLATE 10 MG PO TABS: 10.0000 mg | ORAL_TABLET | Freq: Every day | ORAL | 1 refills | Status: DC

## 2020-01-29 MED ORDER — HYDROCHLOROTHIAZIDE 25 MG PO TABS: 25.0000 mg | ORAL_TABLET | Freq: Every day | ORAL | 0 refills | Status: DC

## 2020-01-29 MED ORDER — CLOPIDOGREL BISULFATE 75 MG PO TABS: 75.0000 mg | ORAL_TABLET | Freq: Every day | ORAL | 0 refills | Status: DC

## 2020-01-29 NOTE — Progress Notes (Signed)
Cardiology Office Note  Date: 01/29/2020   ID: Nicole Horn, DOB Jan 31, 1946, MRN [id number]  PCP:  [name], NP  Cardiologist:  Rozann Lesches, MD Electrophysiologist:  None   Chief Complaint  Patient presents with  . Establish cardiac follow-up    History of Present Illness: Nicole Horn is a 74 y.o. female referred for cardiology consultation by [name] to establish cardiology follow-up.  She is from [facility] living in this area now although still has an apartment in [place].  She states that her mother is from this area.    She reports a history of "heart attack" back in 2004.  Per her description she was hospitalized with chest pain and diaphoresis, underwent stress testing but managed medically.  At some point thereafter she did undergo cardiac catheterization, but does not recall any requirement for revascularization.  She does not describe any angina at this time, chronic dyspnea on exertion which is NYHA class II.  She had COVID-19 pneumonia in January and has had somewhat of a prolonged recovery. She is trying to exercise a few days a week at the gym.  She also reports history of PAD and stent intervention to both legs in [place] a few years ago.  She was having claudication at that time, states that she has had some recurring symptoms recently although not as significant.  I reviewed her medications which are outlined below and well-rounded from a cardiac perspective.  Her LDL was 43 in April.  I also reviewed her ECG from February.  Past Medical History:  Diagnosis Date  . 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 Essential hypertension   . Heart attack [facility]) 2004   [place] - hospitalized with stress test by report and presumably medical therapy; subsequent cardiac catheterization (no PCI)  . Mixed hyperlipidemia   . PAD (peripheral artery disease) ([place]) 2019   PTCA/stent [place]  . Pneumonia due to COVID-19 virus  09/10/2019  . Sleep apnea    Pt supposed to wear CPAP, but doesn't.  . Type 2 diabetes mellitus ([place])     Past Surgical History:  Procedure Laterality Date  . ABDOMINAL HYSTERECTOMY    . BACK SURGERY    . BARTHOLIN GLAND CYST EXCISION Left 03/04/2015   Procedure: EXCISION OF LEFT BARTHOLINS TUMOR;  Surgeon: [name], MD;  Location: [facility];  Service: Gynecology;  Laterality: Left;  . CERVICAL SPINE SURGERY    . CYST REMOVAL TRUNK    . Multiple cyst removal surgeries    . Stent of lower extremities      Current Outpatient Medications  Medication Sig Dispense Refill  . albuterol (VENTOLIN HFA) 108 (90 Base) MCG/ACT inhaler Inhale 2 puffs into the lungs every 4 (four) hours as needed for wheezing or shortness of breath.    Marland Kitchen amLODipine (NORVASC) 10 MG tablet Take 1 tablet (10 mg total) by mouth daily. 90 tablet 1  . aspirin EC 81 MG tablet Take 81 mg by mouth daily.    . clopidogrel (PLAVIX) 75 MG tablet Take 75 mg by mouth daily.    Marland Kitchen dextromethorphan (DELSYM) 30 MG/5ML liquid Take 30 mg by mouth as needed for cough.     . hydrochlorothiazide (HYDRODIURIL) 25 MG tablet Take 25 mg by mouth daily.    . Multiple Vitamin (MULTIVITAMIN WITH MINERALS) TABS tablet Take 1 tablet by mouth daily.    . quinapril (ACCUPRIL) 20 MG tablet Take 20 mg by  mouth daily.    . rosuvastatin (CRESTOR) 40 MG tablet Take 40 mg by mouth daily.     No current facility-administered medications for this visit.   Allergies:  Patient has no known allergies.   Social History: The patient  reports that she has quit smoking. Her smoking use included cigarettes. She has a 50.00 pack-year smoking history. She has never used smokeless tobacco. She reports previous alcohol use. She reports that she does not use drugs.   Family History: The patient's family history includes Breast cancer in her sister; Colon cancer in her mother; Stomach cancer in her maternal aunt.   ROS:   No palpitations or syncope.  Fatigue.   Leg cramping at nighttime.  Physical Exam: VS:  BP (!) 120/54 (BP Location: Right Arm)   Pulse 84   Ht 5' (1.524 m)   Wt 152 lb (68.9 kg)   SpO2 98%   BMI 29.69 kg/m , BMI Body mass index is 29.69 kg/m.  Wt Readings from Last 3 Encounters:  01/29/20 152 lb (68.9 kg)  01/14/20 152 lb (68.9 kg)  01/02/20 152 lb (68.9 kg)    General: Patient appears comfortable at rest. HEENT: Conjunctiva and lids normal, wearing a mask. Neck: Supple, no elevated JVP or carotid bruits, no thyromegaly. Lungs: Clear to auscultation, nonlabored breathing at rest. Cardiac: Regular rate and rhythm, no S3, 9-3/8 systolic murmur, no pericardial rub. Abdomen: Soft, nontender, bowel sounds present, no guarding or rebound. Extremities: No pitting edema, DP's diminished. Skin: Warm and dry.  No ulcerations distally. Musculoskeletal: No kyphosis. Neuropsychiatric: Alert and oriented x3, affect grossly appropriate.  ECG:  An ECG dated 10/05/2019 was personally reviewed today and demonstrated:  Sinus rhythm with nonspecific ST changes.  Recent Labwork: 09/15/2019: Magnesium 2.4 10/05/2019: B Natriuretic Peptide 28.0 12/13/2019: ALT 30; AST 27; BUN 15; Creatinine, Ser 1.01; Hemoglobin 9.2; Platelets 428; Potassium 3.0; Sodium 143     Component Value Date/Time   CHOL 106 12/12/2019 1021   TRIG 85 12/12/2019 1021   HDL 46 (L) 12/12/2019 1021   CHOLHDL 2.3 12/12/2019 1021   LDLCALC 43 12/12/2019 1021    Other Studies Reviewed Today:  Chest x-ray 12/12/2019: FINDINGS: Stable cardiomediastinal contours. Atherosclerotic calcification of the aortic knob. Persistent but improving opacities within the bilateral lung bases and peripheral aspect of the left mid lung. No pleural effusion or pneumothorax.  IMPRESSION: Persistent but improving bilateral pulmonary infiltrates.  Assessment and Plan:  1.  Reported history of heart attack in 2004 as discussed above.  I do not have details regarding previous cardiac  work-up in [place].  It does not sound like she ever required PCI or other revascularization and has been managed medically.  She does not describe any angina at this point, ECG from February is nonspecific.  She is currently on aspirin, Plavix, Norvasc, quinapril, and Crestor with very good LDL control.  We will obtain an echocardiogram to assess cardiac structure and function otherwise continue with observation for now.  2.  PAD status post reported stent intervention to both legs approximately 2 years ago in [place].  Details were not available.  We will obtain lower extremity arterial Dopplers and ABIs.  She remains on dual antiplatelet therapy.  Does report some claudication symptoms.  3.  Mixed hyperlipidemia on Crestor with LDL 43.  Medication Adjustments/Labs and Tests Ordered: Current medicines are reviewed at length with the patient today.  Concerns regarding medicines are outlined above.   Tests Ordered: Orders Placed This  Encounter  Procedures  . ECHOCARDIOGRAM COMPLETE  . VAS [place] LOWER EXTREMITY ARTERIAL DUPLEX    Medication Changes: No orders of the defined types were placed in this encounter.   Disposition:  Follow up 6 months in the [place] office.  Signed, Satira Sark, MD, [facility] 01/29/2020 2:01 PM    [facility] [address], [place], [place] [address] Phone: [phone]; Fax: [phone]

## 2020-01-29 NOTE — Telephone Encounter (Signed)
Medications called into pharmacy.

## 2020-01-29 NOTE — Patient Instructions (Signed)
Medication Instructions:  Your physician recommends that you continue on your current medications as directed. Please refer to the Current Medication list given to you today.  *If you need a refill on your cardiac medications before your next appointment, please call your pharmacy*   Lab Work: None today If you have labs (blood work) drawn today and your tests are completely normal, you will receive your results only by: Marland Kitchen MyChart Message (if you have MyChart) OR . A paper copy in the mail If you have any lab test that is abnormal or we need to change your treatment, we will call you to review the results.   Testing/Procedures: Your physician has requested that you have an echocardiogram. Echocardiography is a painless test that uses sound waves to create images of your heart. It provides your doctor with information about the size and shape of your heart and how well your heart's chambers and valves are working. This procedure takes approximately one hour. There are no restrictions for this procedure.  Your physician has requested that you have a lower or upper extremity arterial duplex. This test is an ultrasound of the arteries in the legs or arms. It looks at arterial blood flow in the legs and arms. Allow one hour for Lower and Upper Arterial scans. There are no restrictions or special instructions     Follow-Up: At [facility], you and your health needs are our priority.  As part of our continuing mission to provide you with exceptional heart care, we have created designated Provider Care Teams.  These Care Teams include your primary Cardiologist (physician) and Advanced Practice Providers (APPs -  Physician Assistants and Nurse Practitioners) who all work together to provide you with the care you need, when you need it.  We recommend signing up for the patient portal called "MyChart".  Sign up information is provided on this After Visit Summary.  MyChart is used to connect with  patients for Virtual Visits (Telemedicine).  Patients are able to view lab/test results, encounter notes, upcoming appointments, etc.  Non-urgent messages can be sent to your provider as well.   To learn more about what you can do with MyChart, go to [email or web address].    Your next appointment:   6 month(s)  The format for your next appointment:   In Person  Provider:   Rozann Lesches, MD   Other Instructions None       Thank you for choosing [facility] !

## 2020-01-29 NOTE — Telephone Encounter (Signed)
Pt LVM that she needs all of her medication called in

## 2020-01-30 ENCOUNTER — Other Ambulatory Visit (HOSPITAL_COMMUNITY)
Admission: RE | Admit: 2020-01-30 | Discharge: 2020-01-30 | Disposition: A | Discharge status: Home / Self Care | Payer: Medicare Other | Source: Ambulatory Visit | Attending: Internal Medicine | Admitting: Internal Medicine

## 2020-01-30 DIAGNOSIS — Z20822 Contact with and (suspected) exposure to covid-19: Secondary | ICD-10-CM | POA: Insufficient documentation

## 2020-01-30 DIAGNOSIS — Z01812 Encounter for preprocedural laboratory examination: Secondary | ICD-10-CM | POA: Diagnosis not present

## 2020-01-31 ENCOUNTER — Other Ambulatory Visit (HOSPITAL_COMMUNITY): Payer: Medicare Other

## 2020-01-31 LAB — SARS CORONAVIRUS 2 (TAT 6-24 HRS): SARS Coronavirus 2: NEGATIVE

## 2020-02-01 ENCOUNTER — Ambulatory Visit (HOSPITAL_COMMUNITY)
Admission: RE | Admit: 2020-02-01 | Discharge: 2020-02-01 | Disposition: A | Discharge status: Home / Self Care | Payer: Medicare Other | Attending: Internal Medicine | Admitting: Internal Medicine

## 2020-02-01 ENCOUNTER — Encounter: Payer: Self-pay | Admitting: Internal Medicine

## 2020-02-01 ENCOUNTER — Other Ambulatory Visit: Payer: Self-pay

## 2020-02-01 ENCOUNTER — Encounter (HOSPITAL_COMMUNITY)
Admission: RE | Disposition: A | Discharge status: Home / Self Care | Payer: Self-pay | Source: Home / Self Care | Attending: Internal Medicine

## 2020-02-01 ENCOUNTER — Encounter (HOSPITAL_COMMUNITY): Payer: Self-pay | Admitting: Internal Medicine

## 2020-02-01 DIAGNOSIS — Z7982 Long term (current) use of aspirin: Secondary | ICD-10-CM | POA: Diagnosis not present

## 2020-02-01 DIAGNOSIS — K5730 Diverticulosis of large intestine without perforation or abscess without bleeding: Secondary | ICD-10-CM | POA: Insufficient documentation

## 2020-02-01 DIAGNOSIS — K921 Melena: Secondary | ICD-10-CM | POA: Insufficient documentation

## 2020-02-01 DIAGNOSIS — Z79899 Other long term (current) drug therapy: Secondary | ICD-10-CM | POA: Insufficient documentation

## 2020-02-01 DIAGNOSIS — E782 Mixed hyperlipidemia: Secondary | ICD-10-CM | POA: Insufficient documentation

## 2020-02-01 DIAGNOSIS — I252 Old myocardial infarction: Secondary | ICD-10-CM | POA: Insufficient documentation

## 2020-02-01 DIAGNOSIS — Z87891 Personal history of nicotine dependence: Secondary | ICD-10-CM | POA: Insufficient documentation

## 2020-02-01 DIAGNOSIS — I10 Essential (primary) hypertension: Secondary | ICD-10-CM | POA: Diagnosis not present

## 2020-02-01 DIAGNOSIS — Z955 Presence of coronary angioplasty implant and graft: Secondary | ICD-10-CM | POA: Insufficient documentation

## 2020-02-01 DIAGNOSIS — D509 Iron deficiency anemia, unspecified: Secondary | ICD-10-CM | POA: Diagnosis not present

## 2020-02-01 DIAGNOSIS — G4730 Sleep apnea, unspecified: Secondary | ICD-10-CM | POA: Diagnosis not present

## 2020-02-01 DIAGNOSIS — E1151 Type 2 diabetes mellitus with diabetic peripheral angiopathy without gangrene: Secondary | ICD-10-CM | POA: Insufficient documentation

## 2020-02-01 DIAGNOSIS — Z7902 Long term (current) use of antithrombotics/antiplatelets: Secondary | ICD-10-CM | POA: Diagnosis not present

## 2020-02-01 DIAGNOSIS — Z8616 Personal history of COVID-19: Secondary | ICD-10-CM | POA: Diagnosis not present

## 2020-02-01 DIAGNOSIS — Z86010 Personal history of colonic polyps: Secondary | ICD-10-CM | POA: Insufficient documentation

## 2020-02-01 HISTORY — PX: COLONOSCOPY: SHX5424

## 2020-02-01 SURGERY — COLONOSCOPY
Anesthesia: Moderate Sedation

## 2020-02-01 MED ORDER — MIDAZOLAM HCL 5 MG/5ML IJ SOLN
INTRAMUSCULAR | Status: DC | PRN
  Administered 2020-02-01: 2 mg via INTRAVENOUS
  Administered 2020-02-01: 1 mg via INTRAVENOUS

## 2020-02-01 MED ORDER — SODIUM CHLORIDE 0.9 % IV SOLN: INTRAVENOUS | Status: DC

## 2020-02-01 MED ORDER — MEPERIDINE HCL 100 MG/ML IJ SOLN
INTRAMUSCULAR | Status: DC | PRN
  Administered 2020-02-01: 15 mg via INTRAVENOUS
  Administered 2020-02-01: 25 mg via INTRAVENOUS

## 2020-02-01 MED ORDER — MIDAZOLAM HCL 5 MG/5ML IJ SOLN
INTRAMUSCULAR | Status: AC
  Filled 2020-02-01: qty 10

## 2020-02-01 MED ORDER — ONDANSETRON HCL 4 MG/2ML IJ SOLN
INTRAMUSCULAR | Status: DC | PRN
  Administered 2020-02-01: 4 mg via INTRAVENOUS

## 2020-02-01 MED ORDER — ONDANSETRON HCL 4 MG/2ML IJ SOLN
INTRAMUSCULAR | Status: AC
  Filled 2020-02-01: qty 2

## 2020-02-01 MED ORDER — MEPERIDINE HCL 50 MG/ML IJ SOLN
INTRAMUSCULAR | Status: AC
  Filled 2020-02-01: qty 1

## 2020-02-01 NOTE — Op Note (Signed)
[facility] Patient Name: Nicole Horn Procedure Date: 02/01/2020 11:23 AM MRN: [id number] Date of Birth: 1945/08/30 Attending MD: Norvel Richards , MD CSN: [id number] Age: 74 Admit Type: Outpatient Procedure:                Colonoscopy Indications:              Hematochezia Providers:                Norvel Richards, MD, Janeece Riggers, RN, Crystal                            Page, Aram Candela Referring MD:              Medicines:                Midazolam 3 mg IV, Meperidine 40 mg IV, Ondansetron                            4 mg IV Complications:            No immediate complications. Estimated Blood Loss:     Estimated blood loss: none. Procedure:                Pre-Anesthesia Assessment:                           - Prior to the procedure, a History and Physical                            was performed, and patient medications and                            allergies were reviewed. The patient's tolerance of                            previous anesthesia was also reviewed. The risks                            and benefits of the procedure and the sedation                            options and risks were discussed with the patient.                            All questions were answered, and informed consent                            was obtained. Prior Anticoagulants: The patient has                            taken no previous anticoagulant or antiplatelet                            agents. ASA Grade Assessment: II - A patient with  mild systemic disease. After reviewing the risks                            and benefits, the patient was deemed in                            satisfactory condition to undergo the procedure.                           After obtaining informed consent, the colonoscope                            was passed under direct vision. Throughout the                            procedure, the patient's blood pressure,  pulse, and                            oxygen saturations were monitored continuously. The                            CF-HQ190L ([id number]) scope was introduced through                            the anus and advanced to the the cecum, identified                            by appendiceal orifice and ileocecal valve. The                            colonoscopy was performed without difficulty. The                            patient tolerated the procedure well. The quality                            of the bowel preparation was adequate. Scope In: 12:26:02 PM Scope Out: 12:37:13 PM Scope Withdrawal Time: 0 hours 7 minutes 35 seconds  Total Procedure Duration: 0 hours 11 minutes 11 seconds  Findings:      The perianal and digital rectal examinations were normal.      Scattered medium-mouthed diverticula were found in the sigmoid colon and       descending colon.      The exam was otherwise without abnormality on direct and retroflexion       views. Impression:               - Diverticulosis in the sigmoid colon and in the                            descending colon.                           - The examination was otherwise normal on direct  and retroflexion views.                           - No specimens collected. Moderate Sedation:      Moderate (conscious) sedation was administered by the endoscopy nurse       and supervised by the endoscopist. The following parameters were       monitored: oxygen saturation, heart rate, blood pressure, respiratory       rate, EKG, adequacy of pulmonary ventilation, and response to care.       Total physician intraservice time was 15 minutes. Recommendation:           - Patient has a contact number available for                            emergencies. The signs and symptoms of potential                            delayed complications were discussed with the                            patient. Return to normal activities  tomorrow.                            Written discharge instructions were provided to the                            patient.                           - Resume previous diet.                           - Continue present medications.                           - No repeat colonoscopy due to current age (90                            years or older).                           - Return to GI clinic in 6 weeks with CBC. Procedure Code(s):        --- Professional ---                           [phone], Colonoscopy, flexible; diagnostic, including                            collection of specimen(s) by brushing or washing,                            when performed (separate procedure)                           G0500, Moderate sedation services provided by the  same physician or other qualified health care                            professional performing a gastrointestinal                            endoscopic service that sedation supports,                            requiring the presence of an independent trained                            observer to assist in the monitoring of the                            patient's level of consciousness and physiological                            status; initial 15 minutes of intra-service time;                            patient age 56 years or older (additional time may                            be reported with [phone], as appropriate) Diagnosis Code(s):        --- Professional ---                           K92.1, Melena (includes Hematochezia)                           K57.30, Diverticulosis of large intestine without                            perforation or abscess without bleeding CPT copyright 2019 American Medical Association. All rights reserved. The codes documented in this report are preliminary and upon coder review may  be revised to meet current compliance requirements. Cristopher Estimable. Berle Fitz, MD Norvel Richards,  MD 02/01/2020 12:44:49 PM This report has been signed electronically. Number of Addenda: 0

## 2020-02-01 NOTE — H&P (Signed)
@LOGO @   Primary Care Physician:  [name], NP Primary Gastroenterologist:  Dr. Gala Romney  Pre-Procedure History & Physical: HPI:  Nicole Horn is a 74 y.o. female here for intermittent rectal bleeding.  Microcytic anemia.  Hemoccult positive.  [place].  Past Medical History:  Diagnosis Date  . 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 Essential hypertension   . Heart attack [facility]) 2004   [place] - hospitalized with stress test by report and presumably medical therapy; subsequent cardiac catheterization (no PCI)  . Mixed hyperlipidemia   . PAD (peripheral artery disease) ([place]) 2019   PTCA/stent [place]  . Pneumonia due to COVID-19 virus 09/10/2019  . Sleep apnea    Pt supposed to wear CPAP, but doesn't.  . Type 2 diabetes mellitus ([place])     Past Surgical History:  Procedure Laterality Date  . ABDOMINAL HYSTERECTOMY    . BACK SURGERY    . BARTHOLIN GLAND CYST EXCISION Left 03/04/2015   Procedure: EXCISION OF LEFT BARTHOLINS TUMOR;  Surgeon: [name], MD;  Location: [facility];  Service: Gynecology;  Laterality: Left;  . CERVICAL SPINE SURGERY    . CYST REMOVAL TRUNK    . Multiple cyst removal surgeries    . Stent of lower extremities      Prior to Admission medications   Medication Sig Start Date End Date Taking? Authorizing Provider  albuterol (VENTOLIN HFA) 108 (90 Base) MCG/ACT inhaler Inhale 2 puffs into the lungs every 4 (four) hours as needed for wheezing or shortness of breath.   Yes [provider]  amLODipine (NORVASC) 10 MG tablet Take 1 tablet (10 mg total) by mouth daily. 01/29/20  Yes [name], NP  aspirin EC 81 MG tablet Take 81 mg by mouth daily.   Yes [provider]  clopidogrel (PLAVIX) 75 MG tablet Take 1 tablet (75 mg total) by mouth daily. 01/29/20  Yes [name], NP  dextromethorphan (DELSYM) 30 MG/5ML liquid Take 30 mg by mouth as needed for cough.    Yes  [provider]  hydrochlorothiazide (HYDRODIURIL) 25 MG tablet Take 1 tablet (25 mg total) by mouth daily. 01/29/20  Yes [name], NP  Multiple Vitamin (MULTIVITAMIN WITH MINERALS) TABS tablet Take 1 tablet by mouth daily.   Yes [provider]  quinapril (ACCUPRIL) 20 MG tablet Take 20 mg by mouth daily.   Yes [provider]  rosuvastatin (CRESTOR) 40 MG tablet Take 1 tablet (40 mg total) by mouth daily. 01/29/20  Yes [name], NP    Allergies as of 01/02/2020  . (No Known Allergies)    Family History  Problem Relation Age of Onset  . Colon cancer Mother        In her 69s  . Breast cancer Sister   . Stomach cancer Maternal Aunt     Social History   Socioeconomic History  . Marital status: Widowed    Spouse name: Not on file  . Number of children: 1  . Years of education: Not on file  . Highest education level: Some college, no degree  Occupational History  . Not on file  Tobacco Use  . Smoking status: Former Smoker    Packs/day: 2.00    Years: 25.00    Pack years: 50.00    Types: Cigarettes  . Smokeless tobacco: Never Used  Vaping Use  . Vaping Use: Never used  Substance and Sexual Activity  .  Alcohol use: Not Currently    Alcohol/week: 0.0 standard drinks    Comment: socially  . Drug use: No  . Sexual activity: Yes    Birth control/protection: Surgical  Other Topics Concern  . Not on file  Social History Narrative   Lives in [place] and [place] months here and there owns an apt in [place] alone, but helps to care for mother who is 37 years (goes between the kids)   Sister is in [place] is in [place]      Son who is 28 years old lives in [place], [place]    Has 6 grand kids and 7 great grands      Enjoys exercise (harder since COVID)-dancing      Diet: eats all foods-enjoys smoothies, avoids fried foods, limited red meat if any   Caffeine: coffee 1 cup in the morning-sometimes will have 1 in  evening    Water: 3-4 bottles daily       Wears seat belt   Smoke detectors in home   Does not use phone while driving            Social Determinants of Health   Financial Resource Strain: Low Risk   . Difficulty of Paying Living Expenses: Not very hard  Food Insecurity: No Food Insecurity  . Worried About Charity fundraiser in the Last Year: Never true  . Ran Out of Food in the Last Year: Never true  Transportation Needs: No Transportation Needs  . Lack of Transportation (Medical): No  . Lack of Transportation (Non-Medical): No  Physical Activity: Inactive  . Days of Exercise per Week: 0 days  . Minutes of Exercise per Session: 0 min  Stress: No Stress Concern Present  . Feeling of Stress : Not at all  Social Connections: Socially Isolated  . Frequency of Communication with Friends and Family: More than three times a week  . Frequency of Social Gatherings with Friends and Family: More than three times a week  . Attends Religious Services: Never  . Active Member of Clubs or Organizations: No  . Attends Archivist Meetings: Never  . Marital Status: Widowed  Intimate Partner Violence: Not At Risk  . Fear of Current or Ex-Partner: No  . Emotionally Abused: No  . Physically Abused: No  . Sexually Abused: No    Review of Systems: See HPI, otherwise negative ROS  Physical Exam: BP (!) 142/62   Pulse 88   Temp 98.9 F (37.2 C) (Oral)   Resp 14   Ht 5' (1.524 m)   Wt 68.9 kg   SpO2 97%   BMI 29.69 kg/m  General:   Alert,  Well-developed, well-nourished, pleasant and cooperative in NAD Mouth:  No deformity or lesions. Neck:  Supple; no masses or thyromegaly. No significant cervical adenopathy. Lungs:  Clear throughout to auscultation.   No wheezes, crackles, or rhonchi. No acute distress. Heart:  Regular rate and rhythm; no murmurs, clicks, rubs,  or gallops. Abdomen: Non-distended, normal bowel sounds.  Soft and nontender without appreciable mass or  hepatosplenomegaly.  Pulses:  Normal pulses noted. Extremities:  Without clubbing or edema.  Impression/Plan: 74 year old lady with intermittent rectal bleeding microcytic anemia Hemoccult positive history of colonic adenomas.  Here for diagnostic colonoscopy per plan. The risks, benefits, limitations, alternatives and imponderables have been reviewed with the patient. Questions have been answered. All parties are agreeable.      Notice: This dictation  was prepared with Dragon dictation along with smaller phrase technology. Any transcriptional errors that result from this process are unintentional and may not be corrected upon review.

## 2020-02-01 NOTE — Discharge Instructions (Signed)
Colonoscopy Discharge Instructions  Read the instructions outlined below and refer to this sheet in the next few weeks. These discharge instructions provide you with general information on caring for yourself after you leave the hospital. Your doctor may also give you specific instructions. While your treatment has been planned according to the most current medical practices available, unavoidable complications occasionally occur. If you have any problems or questions after discharge, call Dr. Gala Romney at [phone]. ACTIVITY  You may resume your regular activity, but move at a slower pace for the next 24 hours.   Take frequent rest periods for the next 24 hours.   Walking will help get rid of the air and reduce the bloated feeling in your belly (abdomen).   No driving for 24 hours (because of the medicine (anesthesia) used during the test).    Do not sign any important legal documents or operate any machinery for 24 hours (because of the anesthesia used during the test).  NUTRITION  Drink plenty of fluids.   You may resume your normal diet as instructed by your doctor.   Begin with a light meal and progress to your normal diet. Heavy or fried foods are harder to digest and may make you feel sick to your stomach (nauseated).   Avoid alcoholic beverages for 24 hours or as instructed.  MEDICATIONS  You may resume your normal medications unless your doctor tells you otherwise.  WHAT YOU CAN EXPECT TODAY  Some feelings of bloating in the abdomen.   Passage of more gas than usual.   Spotting of blood in your stool or on the toilet paper.  SEEK IMMEDIATE MEDICAL ATTENTION IF:  You have more than a spotting of blood in your stool.   Your belly is swollen (abdominal distention).   You are nauseated or vomiting.   You have a temperature over 101.   You have abdominal pain or discomfort that is severe or gets worse throughout the day.    Diverticulosis information provided  No  polyp or tumor found today which is great news  I do not recommend a future colonoscopy unless new symptoms develop  CBC and office visit with [place] in 6 weeks  At patient request, I called Berniece Pap at [phone] -left message on voicemail   Diverticulosis  Diverticulosis is a condition that develops when small pouches (diverticula) form in the wall of the large intestine (colon). The colon is where water is absorbed and stool (feces) is formed. The pouches form when the inside layer of the colon pushes through weak spots in the outer layers of the colon. You may have a few pouches or many of them. The pouches usually do not cause problems unless they become inflamed or infected. When this happens, the condition is called diverticulitis. What are the causes? The cause of this condition is not known. What increases the risk? The following factors may make you more likely to develop this condition:  Being older than age 86. Your risk for this condition increases with age. Diverticulosis is rare among people younger than age 39. By age 76, many people have it.  Eating a low-fiber diet.  Having frequent constipation.  Being overweight.  Not getting enough exercise.  Smoking.  Taking over-the-counter pain medicines, like aspirin and ibuprofen.  Having a family history of diverticulosis. What are the signs or symptoms? In most people, there are no symptoms of this condition. If you do have symptoms, they may include:  Bloating.  Cramps in the  abdomen.  Constipation or diarrhea.  Pain in the lower left side of the abdomen. How is this diagnosed? Because diverticulosis usually has no symptoms, it is most often diagnosed during an exam for other colon problems. The condition may be diagnosed by:  Using a flexible scope to examine the colon (colonoscopy).  Taking an X-ray of the colon after dye has been put into the colon (barium enema).  Having a CT scan. How is  this treated? You may not need treatment for this condition. Your health care provider may recommend treatment to prevent problems. You may need treatment if you have symptoms or if you previously had diverticulitis. Treatment may include:  Eating a high-fiber diet.  Taking a fiber supplement.  Taking a live bacteria supplement (probiotic).  Taking medicine to relax your colon. Follow these instructions at home: Medicines  Take over-the-counter and prescription medicines only as told by your health care provider.  If told by your health care provider, take a fiber supplement or probiotic. Constipation prevention Your condition may cause constipation. To prevent or treat constipation, you may need to:  Drink enough fluid to keep your urine pale yellow.  Take over-the-counter or prescription medicines.  Eat foods that are high in fiber, such as beans, whole grains, and fresh fruits and vegetables.  Limit foods that are high in fat and processed sugars, such as fried or sweet foods.  General instructions  Try not to strain when you have a bowel movement.  Keep all follow-up visits as told by your health care provider. This is important. Contact a health care provider if you:  Have pain in your abdomen.  Have bloating.  Have cramps.  Have not had a bowel movement in 3 days. Get help right away if:  Your pain gets worse.  Your bloating becomes very bad.  You have a fever or chills, and your symptoms suddenly get worse.  You vomit.  You have bowel movements that are bloody or black.  You have bleeding from your rectum. Summary  Diverticulosis is a condition that develops when small pouches (diverticula) form in the wall of the large intestine (colon).  You may have a few pouches or many of them.  This condition is most often diagnosed during an exam for other colon problems.  Treatment may include increasing the fiber in your diet, taking supplements, or taking  medicines. This information is not intended to replace advice given to you by your health care provider. Make sure you discuss any questions you have with your health care provider. Document Revised: 03/08/2019 Document Reviewed: 03/08/2019 Elsevier Patient Education  [place].

## 2020-02-04 ENCOUNTER — Ambulatory Visit: Payer: Medicare (Managed Care) | Admitting: Family Medicine

## 2020-02-05 ENCOUNTER — Other Ambulatory Visit: Payer: Self-pay | Admitting: Cardiology

## 2020-02-05 DIAGNOSIS — I739 Peripheral vascular disease, unspecified: Secondary | ICD-10-CM

## 2020-02-06 ENCOUNTER — Encounter (HOSPITAL_COMMUNITY): Payer: Self-pay | Admitting: Internal Medicine

## 2020-02-18 ENCOUNTER — Telehealth: Payer: Self-pay

## 2020-02-18 ENCOUNTER — Other Ambulatory Visit: Payer: Self-pay | Admitting: *Deleted

## 2020-02-18 MED ORDER — ROSUVASTATIN CALCIUM 40 MG PO TABS: 40.0000 mg | ORAL_TABLET | Freq: Every day | ORAL | 0 refills | Status: DC

## 2020-02-18 MED ORDER — QUINAPRIL HCL 20 MG PO TABS: 20.0000 mg | ORAL_TABLET | Freq: Every day | ORAL | 0 refills | Status: DC

## 2020-02-18 MED ORDER — CLOPIDOGREL BISULFATE 75 MG PO TABS: 75.0000 mg | ORAL_TABLET | Freq: Every day | ORAL | 0 refills | Status: DC

## 2020-02-18 MED ORDER — HYDROCHLOROTHIAZIDE 25 MG PO TABS: 25.0000 mg | ORAL_TABLET | Freq: Every day | ORAL | 0 refills | Status: DC

## 2020-02-18 NOTE — Telephone Encounter (Signed)
Received a fax from [facility] requesting supply sent all meds to mail order pharmacy

## 2020-02-18 NOTE — Telephone Encounter (Signed)
LVM for pt to call the office so I could clarify mail order pharmacy.

## 2020-02-18 NOTE — Telephone Encounter (Signed)
Patient is asking for 90 day refill on all meds and have it go to her mail order.  She said the mail order service has sent request but they have not been answered.  She is asking for someone to please follow up.

## 2020-02-19 ENCOUNTER — Other Ambulatory Visit: Payer: Medicare Other

## 2020-02-29 ENCOUNTER — Other Ambulatory Visit: Payer: Self-pay | Admitting: *Deleted

## 2020-02-29 MED ORDER — ROSUVASTATIN CALCIUM 40 MG PO TABS: 40.0000 mg | ORAL_TABLET | Freq: Every day | ORAL | 0 refills | Status: DC

## 2020-03-04 ENCOUNTER — Ambulatory Visit (INDEPENDENT_AMBULATORY_CARE_PROVIDER_SITE_OTHER): Payer: Medicare Other

## 2020-03-04 DIAGNOSIS — I739 Peripheral vascular disease, unspecified: Secondary | ICD-10-CM

## 2020-03-04 DIAGNOSIS — I25119 Atherosclerotic heart disease of native coronary artery with unspecified angina pectoris: Secondary | ICD-10-CM | POA: Diagnosis not present

## 2020-03-05 ENCOUNTER — Other Ambulatory Visit: Payer: Self-pay | Admitting: *Deleted

## 2020-03-05 MED ORDER — HYDROCHLOROTHIAZIDE 25 MG PO TABS: 25.0000 mg | ORAL_TABLET | Freq: Every day | ORAL | 0 refills | Status: DC

## 2020-03-05 MED ORDER — CLOPIDOGREL BISULFATE 75 MG PO TABS: 75.0000 mg | ORAL_TABLET | Freq: Every day | ORAL | 0 refills | Status: DC

## 2020-03-10 ENCOUNTER — Encounter: Payer: Self-pay | Admitting: Internal Medicine

## 2020-03-10 ENCOUNTER — Other Ambulatory Visit: Payer: Self-pay

## 2020-03-10 ENCOUNTER — Ambulatory Visit (INDEPENDENT_AMBULATORY_CARE_PROVIDER_SITE_OTHER): Payer: Medicare Other | Admitting: Internal Medicine

## 2020-03-10 ENCOUNTER — Ambulatory Visit (HOSPITAL_COMMUNITY)
Admission: RE | Admit: 2020-03-10 | Discharge: 2020-03-10 | Disposition: A | Discharge status: Home / Self Care | Payer: Medicare Other | Source: Ambulatory Visit | Attending: Internal Medicine | Admitting: Internal Medicine

## 2020-03-10 DIAGNOSIS — R0602 Shortness of breath: Secondary | ICD-10-CM | POA: Diagnosis not present

## 2020-03-10 DIAGNOSIS — R0600 Dyspnea, unspecified: Secondary | ICD-10-CM | POA: Diagnosis not present

## 2020-03-10 DIAGNOSIS — I10 Essential (primary) hypertension: Secondary | ICD-10-CM | POA: Diagnosis not present

## 2020-03-10 DIAGNOSIS — R0609 Other forms of dyspnea: Secondary | ICD-10-CM

## 2020-03-10 MED ORDER — OLMESARTAN MEDOXOMIL-HCTZ 20-12.5 MG PO TABS: 1.0000 | ORAL_TABLET | Freq: Every day | ORAL | 11 refills | Status: DC

## 2020-03-10 NOTE — Progress Notes (Signed)
Nicole Horn, female    DOB: 1946-03-27,     MRN: [id number]   Brief patient profile:  21 yowf quit smoking 1986    Admit date: 09/10/2019 Discharge date: 09/15/2019 Equipment/Devices: home O2, 2L  Discharge Condition: Stable CODE STATUS: Full code Diet recommendation: Diabetic  HPI: Per admitting MD, 74 y.o.female,with past medical history of hypertension, hyperlipidemia, diabetes mellitus diet-controlled, apnea does not wear CPAP, patient was diagnosed with COVID-19 1/14, she was referred to monoclonal antibody infusion clinic, patient was noted to be short of breath, with some increased work of breathing, her oxygen saturation was 77%, she was referred to [facility] for direct admission for her acute hypoxic respiratory failure from COVID-19 for pneumonia, and reports she has been feeling weak, with poor appetite, generalized body ache x one  week, diagnosed with COVID-19 1/14, reports her dyspnea x 24 hours PTA  denies diarrhea, she does report some cough with blood-tinged sputum.    Hospital Course / Discharge diagnoses: Acute Hypoxic Respiratory Failure due to Covid-19 Viral Illness /post Covid pneumonitis-patient was admitted to the hospital as she was found hypoxic in the infusion center.  She required 2 L while hospitalized, completed remdesivir course in the hospital and she was also placed on steroids.  Clinically she has improved, her shortness of breath has completely resolved, she is able to ambulate without difficulties, her chest x-ray showed slight improvement however she was still hypoxic requiring 2 L nasal cannula.  She does have a remote history of tobacco use which is potentially contributing.  She received several doses of IV Lasix while hospitalized.  Given clinical return to baseline she will be discharged home in stable condition with home oxygen.  She was advised to see PCP within couple of weeks.  Her CRP, ferritin, D-dimer are all normal on  discharge.  COVID-19 Labs  Recent Labs (last 2 labs)        Recent Labs    09/13/19 0620 09/14/19 0507 09/15/19 0505  DDIMER <0.27 0.33 <0.27  FERRITIN 51 40 34  CRP 1.3* 0.9 0.7      Recent Labs       Lab Results  Component Value Date   SARSCOV2NAA POSITIVE (A) 09/05/2019   [place] Not Detected 01/30/2019     Active Problems Essential hypertension -continue home medications on discharge Hyperlipidemia -Continue statin Hypokalemia -Repleted, her potassium has been and has remained normal, hold home potassium supplements until she gets blood work done as an outpatient History of DM 2 -diet controlled, A1c 6.0. CBGs acceptable as below, continue sliding scale       History of Present Illness  03/10/2020  Pulmonary/ 1st office eval/Khayri Horn  Re cough and doe on accupril  Chief Complaint  Patient presents with  . Consult    Patient had Covid in Jan. Shortness of breath with exertion.  Dry Cough    Dyspnea:  2 blocks / one flight and has to stop at top  Cough: dry, worse with voice>>>  Has severe coughing fits several times a day to point of feeling choked Sleep: after wakes up starts with cough  SABA use: typically p activity never pre challenges or rechallenges  02 stopped in April but not checking sats   No obvious day to day or daytime variability or assoc excess/ purulent sputum or mucus plugs or hemoptysis or cp or chest tightness, subjective wheeze or overt sinus or hb symptoms.   Sleeping  without nocturnal    exacerbation  of  respiratory  c/o's or need for noct saba. Also denies any obvious fluctuation of symptoms with weather or environmental changes or other aggravating or alleviating factors except as outlined above   No unusual exposure hx or h/o childhood pna/ asthma or knowledge of premature birth.  Current Allergies, Complete Past Medical History, Past Surgical History, Family History, and Social History were reviewed in [facility] record.  ROS  The following are not active complaints unless bolded Hoarseness, sore throat (globus sensation) , dysphagia, dental problems, itching, sneezing,  nasal congestion or discharge of excess mucus or purulent secretions, ear ache,   fever, chills, sweats, unintended wt loss or wt gain, classically pleuritic or exertional cp,  orthopnea pnd or arm/hand swelling  or leg swelling, presyncope, palpitations, abdominal pain, anorexia, nausea, vomiting, diarrhea  or change in bowel habits or change in bladder habits, change in stools or change in urine, dysuria, hematuria,  rash, arthralgias, visual complaints, headache, numbness, weakness or ataxia or problems with walking or coordination,  change in mood or  memory.           Past Medical History:  Diagnosis Date  . 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 Essential hypertension   . Heart attack [facility]) 2004   [place] - hospitalized with stress test by report and presumably medical therapy; subsequent cardiac catheterization (no PCI)  . Mixed hyperlipidemia   . PAD (peripheral artery disease) ([place]) 2019   PTCA/stent [place]  . Pneumonia due to COVID-19 virus 09/10/2019  . Sleep apnea    Pt supposed to wear CPAP, but doesn't.  . Type 2 diabetes mellitus ([place])     Outpatient Medications Prior to Visit  Medication Sig Dispense Refill  . albuterol (VENTOLIN HFA) 108 (90 Base) MCG/ACT inhaler Inhale 2 puffs into the lungs every 4 (four) hours as needed for wheezing or shortness of breath.    Marland Kitchen amLODipine (NORVASC) 10 MG tablet Take 1 tablet (10 mg total) by mouth daily. 90 tablet 1  . aspirin EC 81 MG tablet Take 81 mg by mouth daily.    . clopidogrel (PLAVIX) 75 MG tablet Take 1 tablet (75 mg total) by mouth daily. 90 tablet 0  . dextromethorphan (DELSYM) 30 MG/5ML liquid Take 30 mg by mouth as needed for cough.     . hydrochlorothiazide (HYDRODIURIL) 25 MG tablet Take 1 tablet (25 mg total) by mouth daily. 90  tablet 0  . Multiple Vitamin (MULTIVITAMIN WITH MINERALS) TABS tablet Take 1 tablet by mouth daily.    . quinapril (ACCUPRIL) 20 MG tablet Take 1 tablet (20 mg total) by mouth daily. 90 tablet 0  . rosuvastatin (CRESTOR) 40 MG tablet Take 1 tablet (40 mg total) by mouth daily. 90 tablet 0   No facility-administered medications prior to visit.     Objective:     BP 140/68 (BP Location: Left Arm, Patient Position: Sitting, Cuff Size: Normal)   Pulse 82   Temp 98.7 F (37.1 C) (Oral)   Ht 5' (1.524 m)   Wt 145 lb (65.8 kg)   SpO2 100% Comment: on room air  BMI 28.32 kg/m   SpO2: 100 % (on room air)    Pleasant amb bf nad walks with brisk pace   HEENT : pt wearing mask not removed for exam due to covid -19 concerns.    NECK :  without JVD/Nodes/TM/ nl carotid upstrokes bilaterally   LUNGS: no acc muscle use,  Nl contour chest which  is clear to A and P bilaterally without cough on insp or exp maneuvers   CV:  RRR  no s3 or murmur or increase in P2, and no edema   ABD:  soft and nontender with nl inspiratory excursion in the supine position. No bruits or organomegaly appreciated, bowel sounds nl  MS:  Nl gait/ ext warm without deformities, calf tenderness, cyanosis or clubbing No obvious joint restrictions   SKIN: warm and dry without lesions    NEURO:  alert, approp, nl sensorium with  no motor or cerebellar deficits apparent.       I personally reviewed images and agree with radiology impression as follows:  CXR:   12/12/19  Persistent but improving bilateral pulmonary infiltrates.  CXR PA and Lateral:   03/10/2020 :    I personally reviewed images and   impression as follows:   Very minimal residual scarring / slt elevation of L HD with increased intestinal air in LUQ        Assessment       DOE (dyspnea on exertion) Onset with covid 19 pna 09/06/19  - quit smoking 1986  -  03/10/2020   Walked RA  approx   400  ft  @ fast pace  stopped due to  End of study,  sats still  98%  - trial off acei  03/10/2020 >>>   Symptoms are markedly disproportionate to objective findings and not clear to what extent this is actually a pulmonary  problem but pt does appear to have difficult to sort out respiratory symptoms of unknown origin for which  DDX  = almost all start with A and  include Adherence, Ace Inhibitors, Acid Reflux, Active Sinus Disease, Alpha 1 Antitripsin deficiency, Anxiety masquerading as Airways dz,  ABPA,  Allergy(esp in young), Aspiration (esp in elderly), Adverse effects of meds,  Active smoking or Vaping, A bunch of PE's/clot burden (a few small clots can't cause this syndrome unless there is already severe underlying pulm or vascular dz with poor reserve),  Anemia or thyroid disorder, plus two Bs  = Bronchiectasis and Beta blocker use..and one C= CHF     Adherence is always the initial "prime suspect" and is a multilayered concern that requires a "trust but verify" approach in every patient - starting with knowing how to use medications, especially inhalers, correctly, keeping up with refills and understanding the fundamental difference between maintenance and prns vs those medications only taken for a very short course and then stopped and not refilled.  - return with all meds in hand using a trust but verify approach to confirm accurate Medication  Reconciliation The principal here is that until we are certain that the  patients are doing what we've asked, it makes no sense to ask them to do more.   ACEi adverse effects at the  top of the usual list of suspects and the only way to rule it out is a trial off > see a/p   ? Allergy/asthma/ copd >  When respiratory symptoms begin or become refractory well after a patient reports complete smoking cessation,  Especially when this wasn't the case while they were smoking, a red flag is raised based on the work of Dr Kris Mouton which states: if you quit smoking when your best day FEV1 is still well preserved it  is highly unlikely you will progress to severe disease. That is to say, once the smoking stops,  the symptoms should not suddenly erupt or markedly worsen.  If so, the differential diagnosis should include  obesity/deconditioning,  LPR/Reflux/Aspiration syndromes,  occult CHF, or  especially side effect of medications commonly used in this population, especially acei.    ACE inhibitors are problematic in  pts with airway complaints because  even experienced pulmonologists can't always distinguish ace effects from copd/asthma.  By themselves they don't actually cause a problem, much like oxygen can't by itself start a fire, but they certainly serve as a powerful catalyst or enhancer for any "fire"  or inflammatory process in the upper airway, be it caused by an ET  tube or more commonly reflux (especially in the obese or pts with known GERD or who are on biphoshonates).    In the era of ARB near equivalency until we have a better handle on the reversibility of the airway problem, it just makes sense to avoid ACEI  entirely in the short run and then decide later, having established a level of airway control using a reasonable limited regimen, whether to add back ace but even then being very careful to observe the pt for worsening airway control and number of meds used/ needed to control symptoms.    In terms of saba use: I spent extra time with pt today reviewing appropriate use of albuterol for prn use on exertion with the following points: 1) saba is for relief of sob that does not improve by walking a slower pace or resting but rather if the pt does not improve after trying this first. 2) If the pt is convinced, as many are, that saba helps recover from activity faster then it's easy to tell if this is the case by re-challenging : ie stop, take the inhaler, then p 5 minutes try the exact same activity (intensity of workload) that just caused the symptoms and see if they are substantially diminished or not  after saba 3) if there is an activity that reproducibly causes the symptoms, try the saba 15 min before the activity on alternate days   If in fact the saba really does help, then fine to continue to use it prn but advised may need to look closer at the maintenance regimen being used to achieve better control of airways disease with exertion.     Essential hypertension D/c acei 03/10/2020 due to cough/sob p covid  In the best review of chronic cough to date ( NEJM 2016 375 [id number]) ,  ACEi are now felt to cause cough in up to  20% of pts which is a 4 fold increase from previous reports and does not include the variety of non-specific complaints we see in pulmonary clinic in pts on ACEi but previously attributed to another dx like  Copd/asthma and  include PNDS, throat fullness/ globus and chest congestion, "bronchitis", unexplained dyspnea and "choking" sensations, and hoarseness, but also  atypical /refractory GERD symptoms like dysphagia and "bad heartburn"   The only way I know  to prove this is not an "ACEi Case" is a trial off ACEi x a minimum of 6 weeks then regroup.   Try off hctz/ accupril and just rx with benicar 20/12.5 daily x 6 weeks then ov to see if additional w/u warranted    >>> f/u 6 weeks to re-eval for the remainder of the possibilities above if not improved to her satisfaction       Each maintenance medication was reviewed in detail including emphasizing most importantly the difference between maintenance and prns and under what circumstances the prns are to  be triggered using an action plan format where appropriate.  Total time for H and P, chart review, counseling, t  directly observing portions of ambulatory 02 saturation study/ and generating customized AVS unique to this office visit / charting = 45 min         Christinia Gully, MD 03/10/2020

## 2020-03-10 NOTE — Patient Instructions (Addendum)
Only use your albuterol as a rescue medication to be used if you can't catch your breath by resting or doing a relaxed purse lip breathing pattern.  - The less you use it, the better it will work when you need it. - Ok to use up to 2 puffs  every 4 hours if you must but call for immediate appointment if use goes up over your usual need - Don't leave home without it !!  (think of it like the spare tire for your car)   Also ok > Try albuterol 15 min before an activity that you know would make you short of breath and see if it makes any difference and if makes none then don't take it after activity unless you can't catch your breath.  Make sure you check your oxygen saturations at highest level of activity to be sure it stays over 90% and adjust upward to maintain this level if needed but remember to turn it back to previous settings when you stop (to conserve your supply).    Stop quinapril and hydrochlorothiazide   and replace with benicar(olmesartan)  20/12.5 mg one daily in its place   Please remember to go to the  x-ray department  for your tests - we will call you with the results when they are available      Please schedule a follow up office visit in 6 weeks, call sooner if needed

## 2020-03-10 NOTE — Assessment & Plan Note (Signed)
D/c acei 03/10/2020 due to cough/sob p covid  In the best review of chronic cough to date ( NEJM 2016 375 [id number]) ,  ACEi are now felt to cause cough in up to  20% of pts which is a 4 fold increase from previous reports and does not include the variety of non-specific complaints we see in pulmonary clinic in pts on ACEi but previously attributed to another dx like  Copd/asthma and  include PNDS, throat fullness/ globus and chest congestion, "bronchitis", unexplained dyspnea and "choking" sensations, and hoarseness, but also  atypical /refractory GERD symptoms like dysphagia and "bad heartburn"   The only way I know  to prove this is not an "ACEi Case" is a trial off ACEi x a minimum of 6 weeks then regroup.   Try off hctz/ accupril and just rx with benicar 20/12.5 daily x 6 weeks then ov to see if additional w/u warranted          Each maintenance medication was reviewed in detail including emphasizing most importantly the difference between maintenance and prns and under what circumstances the prns are to be triggered using an action plan format where appropriate.  Total time for H and P, chart review, counseling, t  directly observing portions of ambulatory 02 saturation study/ and generating customized AVS unique to this office visit / charting = 45 min

## 2020-03-10 NOTE — Assessment & Plan Note (Signed)
Onset with covid 19 pna 09/06/19  - quit smoking 1986  -  03/10/2020   Walked RA  approx   400  ft  @ fast pace  stopped due to  End of study, sats still  98%  - trial off acei  03/10/2020 >>>   Symptoms are markedly disproportionate to objective findings and not clear to what extent this is actually a pulmonary  problem but pt does appear to have difficult to sort out respiratory symptoms of unknown origin for which  DDX  = almost all start with A and  include Adherence, Ace Inhibitors, Acid Reflux, Active Sinus Disease, Alpha 1 Antitripsin deficiency, Anxiety masquerading as Airways dz,  ABPA,  Allergy(esp in young), Aspiration (esp in elderly), Adverse effects of meds,  Active smoking or Vaping, A bunch of PE's/clot burden (a few small clots can't cause this syndrome unless there is already severe underlying pulm or vascular dz with poor reserve),  Anemia or thyroid disorder, plus two Bs  = Bronchiectasis and Beta blocker use..and one C= CHF     Adherence is always the initial "prime suspect" and is a multilayered concern that requires a "trust but verify" approach in every patient - starting with knowing how to use medications, especially inhalers, correctly, keeping up with refills and understanding the fundamental difference between maintenance and prns vs those medications only taken for a very short course and then stopped and not refilled.  - return with all meds in hand using a trust but verify approach to confirm accurate Medication  Reconciliation The principal here is that until we are certain that the  patients are doing what we've asked, it makes no sense to ask them to do more.   ACEi adverse effects at the  top of the usual list of suspects and the only way to rule it out is a trial off > see a/p   ? Allergy/asthma/ copd >  When respiratory symptoms begin or become refractory well after a patient reports complete smoking cessation,  Especially when this wasn't the case while they were  smoking, a red flag is raised based on the work of Dr Kris Mouton which states: if you quit smoking when your best day FEV1 is still well preserved it is highly unlikely you will progress to severe disease. That is to say, once the smoking stops,  the symptoms should not suddenly erupt or markedly worsen.  If so, the differential diagnosis should include  obesity/deconditioning,  LPR/Reflux/Aspiration syndromes,  occult CHF, or  especially side effect of medications commonly used in this population, especially acei.    ACE inhibitors are problematic in  pts with airway complaints because  even experienced pulmonologists can't always distinguish ace effects from copd/asthma.  By themselves they don't actually cause a problem, much like oxygen can't by itself start a fire, but they certainly serve as a powerful catalyst or enhancer for any "fire"  or inflammatory process in the upper airway, be it caused by an ET  tube or more commonly reflux (especially in the obese or pts with known GERD or who are on biphoshonates).    In the era of ARB near equivalency until we have a better handle on the reversibility of the airway problem, it just makes sense to avoid ACEI  entirely in the short run and then decide later, having established a level of airway control using a reasonable limited regimen, whether to add back ace but even then being very careful to observe the  pt for worsening airway control and number of meds used/ needed to control symptoms.    In terms of saba use: I spent extra time with pt today reviewing appropriate use of albuterol for prn use on exertion with the following points: 1) saba is for relief of sob that does not improve by walking a slower pace or resting but rather if the pt does not improve after trying this first. 2) If the pt is convinced, as many are, that saba helps recover from activity faster then it's easy to tell if this is the case by re-challenging : ie stop, take the inhaler,  then p 5 minutes try the exact same activity (intensity of workload) that just caused the symptoms and see if they are substantially diminished or not after saba 3) if there is an activity that reproducibly causes the symptoms, try the saba 15 min before the activity on alternate days   If in fact the saba really does help, then fine to continue to use it prn but advised may need to look closer at the maintenance regimen being used to achieve better control of airways disease with exertion.   >>> f/u 6 weeks to re-eval for the remainder of the possibilities above if not improved to her satisfaction

## 2020-03-11 ENCOUNTER — Ambulatory Visit: Payer: Medicare Other | Admitting: Family Medicine

## 2020-03-11 NOTE — Progress Notes (Signed)
Spoke with pt and notified of results per Dr. Wert. Pt verbalized understanding and denied any questions. 

## 2020-03-13 ENCOUNTER — Ambulatory Visit (INDEPENDENT_AMBULATORY_CARE_PROVIDER_SITE_OTHER): Payer: Medicare Other | Admitting: Family Medicine

## 2020-03-13 ENCOUNTER — Encounter: Payer: Self-pay | Admitting: Family Medicine

## 2020-03-13 ENCOUNTER — Other Ambulatory Visit: Payer: Self-pay

## 2020-03-13 VITALS — BP 111/56 | HR 86 | Temp 97.4°F | Resp 16 | Ht 60.0 in | Wt 147.1 lb

## 2020-03-13 DIAGNOSIS — R42 Dizziness and giddiness: Secondary | ICD-10-CM | POA: Insufficient documentation

## 2020-03-13 DIAGNOSIS — D509 Iron deficiency anemia, unspecified: Secondary | ICD-10-CM | POA: Diagnosis not present

## 2020-03-13 NOTE — Assessment & Plan Note (Signed)
She reports some dizziness that is increased recently.  She also has had a low hemoglobin in the past.  Increase in ice chewing.  Will be getting updated labs stat to make sure that this is not something that she needs to go to the emergency room for for transfusion.  Possible need to either start oral iron and/or infusions depending on situation.  Patient is in agreements with any plan of care that is decided.

## 2020-03-13 NOTE — Patient Instructions (Signed)
I appreciate the opportunity to provide you with care for your health and wellness. Today we discussed: medications and chewing ice  Follow up: as scheduled   Stat Labs  No referrals today  Please continue all medications as ordered. Glad cough is clearing up.  Pending your lab results we will decided on best treatment if blood levels are low.  Please continue to practice social distancing to keep you, your family, and our community safe.  If you must go out, please wear a mask and practice good handwashing.  It was a pleasure to see you and I look forward to continuing to work together on your health and well-being. Please do not hesitate to call the office if you need care or have questions about your care.  Have a wonderful day and week. With Gratitude, Cherly Beach, DNP, AGNP-BC

## 2020-03-13 NOTE — Assessment & Plan Note (Signed)
She has had trending low hemoglobin.  However she reports that she has been increasing her ice chewing secondary to desire of having it.  Will be getting stat labs to make sure that her hemoglobin is not in need of transfusion level.  She also has reported some dizziness which is a little bit more concerning as well.  She was advised that if her hemoglobin comes back low that we will advise for her to go to the nearest emergency room for possible transfusion needs.  She is in agreements with this.

## 2020-03-13 NOTE — Progress Notes (Signed)
Subjective:  Patient ID: Nicole Horn, female    DOB: 11-Feb-1946  Age: 74 y.o. MRN: [id number]  CC:  Chief Complaint  Patient presents with   Follow-up    has been getting light headed / dizzy alot more recent goes and comes when she bends over also if she gets excited or upset this happens      HPI  HPI Ms Bousquet is a 74 year old female patient of mine.  She presents today back for follow-up.  She reports that she has been starting to get lightheaded and dizzy a lot more recently.  She reports it comes and goes is not consistent.  Sometimes it happens when she bends over.  Sometimes happens when she gets upset or excited.  She reports that she did have a change in her medication this week by the pulmonologist.  She reports that this was to help with her cough.  And it has improved.  She was taken off the ACE inhibitor and placed on a ARB combo with hydrochlorothiazide.  She additionally wants to know why she has to stay on Plavix.  However she has a history of having an irregular heartbeat.  Would like cardiology to review this with her as to whether or not she would need to come off of it or can come off of it.  She is open to this.  She reports that she has been eating a lot of ice recently just more than usual.  She has had low hemoglobin in the past.  And is willing to get updated labs today.  She denies having any headaches, vision changes, shortness of breath or chest pain.   Today patient denies signs and symptoms of COVID 19 infection including fever, chills, cough, shortness of breath, and headache. Past Medical, Surgical, Social History, Allergies, and Medications have been Reviewed.   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   Type 2 diabetes mellitus (HCC)     Current Meds  Medication Sig   albuterol (VENTOLIN HFA) 108 (90 Base) MCG/ACT inhaler Inhale 2 puffs into the lungs every 4 (four) hours as needed for wheezing or shortness of breath.   amLODipine (NORVASC) 10 MG tablet Take 1 tablet (10 mg total) by mouth daily.   aspirin EC 81 MG tablet Take 81 mg by mouth daily.   clopidogrel (PLAVIX) 75 MG tablet Take 1 tablet (75 mg total) by mouth daily.   Multiple Vitamin (MULTIVITAMIN WITH MINERALS) TABS tablet Take 1 tablet by mouth daily.   olmesartan-hydrochlorothiazide (BENICAR HCT) 20-12.5 MG tablet Take 1 tablet by mouth daily.   rosuvastatin (CRESTOR) 40 MG tablet Take 1 tablet (40 mg total) by mouth daily.    ROS:  Review of Systems  Constitutional: Negative.   HENT: Negative.   Eyes: Negative.   Respiratory: Negative.   Cardiovascular: Negative.   Gastrointestinal: Negative.   Genitourinary: Negative.  Musculoskeletal: Negative.   Skin: Negative.   Neurological: Positive for dizziness.  Endo/Heme/Allergies: Negative.   Psychiatric/Behavioral: Negative.   All other systems reviewed and are negative.    Objective:   Today's Vitals: BP (!) 111/56 (BP Location: Right Arm, Patient Position: Sitting, Cuff Size: Normal)    Pulse 86    Temp (!) 97.4 F (36.3 C) (Temporal)    Resp 16    Ht 5' (1.524 m)    Wt 147 lb 1.9 oz (66.7 kg)    SpO2 99%    BMI 28.73 kg/m  Vitals with BMI 03/13/2020 03/10/2020 02/01/2020  Height 5\' 0"  5\' 0"  -  Weight 147 lbs 2 oz 145 lbs -  BMI 63.14 97.02 -  Systolic 637 858 850  Diastolic 56 68 55  Pulse 86 82 71      Physical Exam Vitals and nursing note reviewed.  Constitutional:      Appearance: Normal appearance. She is well-developed, well-groomed and overweight.  HENT:     Head: Normocephalic and atraumatic.     Right Ear: External ear normal.     Left Ear: External ear normal.     Nose: Nose normal.     Mouth/Throat:     Comments: Mask in place Eyes:     General:        Right eye: No discharge.        Left eye: No discharge.     Conjunctiva/sclera: Conjunctivae normal.  Cardiovascular:     Rate and Rhythm: Normal rate and regular rhythm.     Pulses: Normal pulses.     Heart sounds: Normal heart sounds.  Pulmonary:     Effort: Pulmonary effort is normal.     Breath sounds: Normal breath sounds.  Musculoskeletal:        General: Normal range of motion.     Cervical back: Normal range of motion and neck supple.  Skin:    General: Skin is warm.  Neurological:     General: No focal deficit present.     Mental Status: She is alert and oriented to person, place, and time.  Psychiatric:        Attention and Perception: Attention normal.        Mood and Affect: Mood normal.        Speech: Speech normal.        Behavior: Behavior normal. Behavior is cooperative.        Thought Content: Thought content normal.        Cognition and Memory: Cognition normal.        Judgment: Judgment normal.     Assessment   1. Microcytic anemia   2. Dizziness     Tests ordered Orders Placed This Encounter  Procedures   CBC   Comprehensive metabolic panel    Plan: Please see assessment and plan per problem list above.   No orders of the defined types were placed in this encounter.   Patient to follow-up in as scheduled.  [name], NP

## 2020-03-14 ENCOUNTER — Telehealth: Payer: Self-pay | Admitting: Family Medicine

## 2020-03-14 ENCOUNTER — Observation Stay (HOSPITAL_COMMUNITY)
Admission: EM | Admit: 2020-03-14 | Discharge: 2020-03-15 | Disposition: A | Discharge status: Home / Self Care | Payer: Medicare Other | Attending: Internal Medicine | Admitting: Internal Medicine

## 2020-03-14 ENCOUNTER — Other Ambulatory Visit: Payer: Self-pay

## 2020-03-14 ENCOUNTER — Encounter (HOSPITAL_COMMUNITY): Payer: Self-pay | Admitting: Emergency Medicine

## 2020-03-14 DIAGNOSIS — D649 Anemia, unspecified: Principal | ICD-10-CM

## 2020-03-14 DIAGNOSIS — Z79899 Other long term (current) drug therapy: Secondary | ICD-10-CM | POA: Insufficient documentation

## 2020-03-14 DIAGNOSIS — K5900 Constipation, unspecified: Secondary | ICD-10-CM | POA: Diagnosis not present

## 2020-03-14 DIAGNOSIS — I2510 Atherosclerotic heart disease of native coronary artery without angina pectoris: Secondary | ICD-10-CM | POA: Diagnosis not present

## 2020-03-14 DIAGNOSIS — Z20822 Contact with and (suspected) exposure to covid-19: Secondary | ICD-10-CM | POA: Insufficient documentation

## 2020-03-14 DIAGNOSIS — I739 Peripheral vascular disease, unspecified: Secondary | ICD-10-CM | POA: Diagnosis not present

## 2020-03-14 DIAGNOSIS — I10 Essential (primary) hypertension: Secondary | ICD-10-CM

## 2020-03-14 DIAGNOSIS — E785 Hyperlipidemia, unspecified: Secondary | ICD-10-CM

## 2020-03-14 DIAGNOSIS — Z87891 Personal history of nicotine dependence: Secondary | ICD-10-CM | POA: Insufficient documentation

## 2020-03-14 DIAGNOSIS — Z8544 Personal history of malignant neoplasm of other female genital organs: Secondary | ICD-10-CM | POA: Diagnosis not present

## 2020-03-14 DIAGNOSIS — D509 Iron deficiency anemia, unspecified: Secondary | ICD-10-CM | POA: Insufficient documentation

## 2020-03-14 DIAGNOSIS — Z7901 Long term (current) use of anticoagulants: Secondary | ICD-10-CM | POA: Insufficient documentation

## 2020-03-14 DIAGNOSIS — R531 Weakness: Secondary | ICD-10-CM | POA: Insufficient documentation

## 2020-03-14 LAB — COMPREHENSIVE METABOLIC PANEL
ALT: 16 IU/L (ref 0–32)
ALT: 18 U/L (ref 0–44)
AST: 21 IU/L (ref 0–40)
AST: 25 U/L (ref 15–41)
Albumin/Globulin Ratio: 1.6 (ref 1.2–2.2)
Albumin: 4.3 g/dL (ref 3.7–4.7)
Albumin: 3.9 g/dL (ref 3.5–5.0)
Alkaline Phosphatase: 72 IU/L (ref 48–121)
Alkaline Phosphatase: 57 U/L (ref 38–126)
Anion gap: 9 (ref 5–15)
BUN/Creatinine Ratio: 12 (ref 12–28)
BUN: 17 mg/dL (ref 8–27)
BUN: 17 mg/dL (ref 8–23)
Bilirubin Total: 0.2 mg/dL (ref 0.0–1.2)
CO2: 25 mmol/L (ref 20–29)
CO2: 25 mmol/L (ref 22–32)
Calcium: 9.6 mg/dL (ref 8.7–10.3)
Calcium: 8.9 mg/dL (ref 8.9–10.3)
Chloride: 106 mmol/L (ref 96–106)
Chloride: 108 mmol/L (ref 98–111)
Creatinine, Ser: 1.37 mg/dL — ABNORMAL HIGH (ref 0.57–1.00)
Creatinine, Ser: 1.21 mg/dL — ABNORMAL HIGH (ref 0.44–1.00)
GFR calc Af Amer: 44 mL/min/{1.73_m2} — ABNORMAL LOW (ref >59)
GFR calc Af Amer: 52 mL/min — ABNORMAL LOW (ref >60)
GFR calc non Af Amer: 39 mL/min/{1.73_m2} — ABNORMAL LOW (ref >59)
GFR calc non Af Amer: 45 mL/min — ABNORMAL LOW (ref >60)
Globulin, Total: 2.7 g/dL (ref 1.5–4.5)
Glucose, Bld: 107 mg/dL — ABNORMAL HIGH (ref 70–99)
Glucose: 78 mg/dL (ref 65–99)
Potassium: 4.1 mmol/L (ref 3.5–5.2)
Potassium: 3.7 mmol/L (ref 3.5–5.1)
Sodium: 143 mmol/L (ref 134–144)
Sodium: 142 mmol/L (ref 135–145)
Total Bilirubin: 0.4 mg/dL (ref 0.3–1.2)
Total Protein: 7 g/dL (ref 6.0–8.5)
Total Protein: 7.1 g/dL (ref 6.5–8.1)

## 2020-03-14 LAB — CBC WITH DIFFERENTIAL/PLATELET
Abs Immature Granulocytes: 0.02 10*3/uL (ref 0.00–0.07)
Basophils Absolute: 0 10*3/uL (ref 0.0–0.1)
Basophils Relative: 1 %
Eosinophils Absolute: 0.3 10*3/uL (ref 0.0–0.5)
Eosinophils Relative: 5 %
HCT: 22.5 % — ABNORMAL LOW (ref 36.0–46.0)
Hemoglobin: 6 g/dL — CL (ref 12.0–15.0)
Immature Granulocytes: 0 %
Lymphocytes Relative: 33 %
Lymphs Abs: 1.7 10*3/uL (ref 0.7–4.0)
MCH: 17.2 pg — ABNORMAL LOW (ref 26.0–34.0)
MCHC: 26.7 g/dL — ABNORMAL LOW (ref 30.0–36.0)
MCV: 64.7 fL — ABNORMAL LOW (ref 80.0–100.0)
Monocytes Absolute: 0.6 10*3/uL (ref 0.1–1.0)
Monocytes Relative: 12 %
Neutro Abs: 2.5 10*3/uL (ref 1.7–7.7)
Neutrophils Relative %: 49 %
Platelets: 347 10*3/uL (ref 150–400)
RBC: 3.48 MIL/uL — ABNORMAL LOW (ref 3.87–5.11)
RDW: 20.7 % — ABNORMAL HIGH (ref 11.5–15.5)
WBC: 5 10*3/uL (ref 4.0–10.5)
nRBC: 0 % (ref 0.0–0.2)

## 2020-03-14 LAB — IRON AND TIBC
Iron: 16 ug/dL — ABNORMAL LOW (ref 28–170)
Saturation Ratios: 3 % — ABNORMAL LOW (ref 10.4–31.8)
TIBC: 509 ug/dL — ABNORMAL HIGH (ref 250–450)
UIBC: 493 ug/dL

## 2020-03-14 LAB — RETICULOCYTES
Immature Retic Fract: 34.6 % — ABNORMAL HIGH (ref 2.3–15.9)
RBC.: 3.48 MIL/uL — ABNORMAL LOW (ref 3.87–5.11)
Retic Count, Absolute: 49.1 10*3/uL (ref 19.0–186.0)
Retic Ct Pct: 1.4 % (ref 0.4–3.1)

## 2020-03-14 LAB — POC OCCULT BLOOD, ED: Fecal Occult Bld: NEGATIVE

## 2020-03-14 LAB — CBC
Hematocrit: 24.3 % — ABNORMAL LOW (ref 34.0–46.6)
Hemoglobin: 6.3 g/dL — CL (ref 11.1–15.9)
MCH: 17 pg — ABNORMAL LOW (ref 26.6–33.0)
MCHC: 25.9 g/dL — ABNORMAL LOW (ref 31.5–35.7)
MCV: 66 fL — ABNORMAL LOW (ref 79–97)
Platelets: 400 10*3/uL (ref 150–450)
RBC: 3.71 x10E6/uL — ABNORMAL LOW (ref 3.77–5.28)
RDW: 18.6 % — ABNORMAL HIGH (ref 11.7–15.4)
WBC: 7.6 10*3/uL (ref 3.4–10.8)

## 2020-03-14 LAB — VITAMIN B12: Vitamin B-12: 420 pg/mL (ref 180–914)

## 2020-03-14 LAB — FOLATE: Folate: 40 ng/mL (ref >5.9)

## 2020-03-14 LAB — PREPARE RBC (CROSSMATCH)

## 2020-03-14 LAB — FERRITIN: Ferritin: 3 ng/mL — ABNORMAL LOW (ref 11–307)

## 2020-03-14 LAB — SARS CORONAVIRUS 2 BY RT PCR (HOSPITAL ORDER, PERFORMED IN ~~LOC~~ HOSPITAL LAB): SARS Coronavirus 2: NEGATIVE

## 2020-03-14 MED ORDER — ONDANSETRON HCL 4 MG/2ML IJ SOLN: 4.0000 mg | Freq: Four times a day (QID) | INTRAMUSCULAR | Status: DC | PRN

## 2020-03-14 MED ORDER — ACETAMINOPHEN 650 MG RE SUPP: 650.0000 mg | Freq: Four times a day (QID) | RECTAL | Status: DC | PRN

## 2020-03-14 MED ORDER — ADULT MULTIVITAMIN W/MINERALS CH
1.0000 | ORAL_TABLET | Freq: Every day | ORAL | Status: DC
  Administered 2020-03-14 – 2020-03-15 (×2): 1 via ORAL
  Filled 2020-03-14 (×2): qty 1

## 2020-03-14 MED ORDER — ALBUTEROL SULFATE (2.5 MG/3ML) 0.083% IN NEBU: 2.5000 mg | INHALATION_SOLUTION | RESPIRATORY_TRACT | Status: DC | PRN

## 2020-03-14 MED ORDER — AMLODIPINE BESYLATE 5 MG PO TABS
10.0000 mg | ORAL_TABLET | Freq: Every day | ORAL | Status: DC
  Administered 2020-03-14 – 2020-03-15 (×2): 10 mg via ORAL
  Filled 2020-03-14 (×2): qty 2

## 2020-03-14 MED ORDER — ROSUVASTATIN CALCIUM 40 MG PO TABS
40.0000 mg | ORAL_TABLET | Freq: Every day | ORAL | Status: DC
  Filled 2020-03-14 (×4): qty 1

## 2020-03-14 MED ORDER — ONDANSETRON HCL 4 MG PO TABS: 4.0000 mg | ORAL_TABLET | Freq: Four times a day (QID) | ORAL | Status: DC | PRN

## 2020-03-14 MED ORDER — ACETAMINOPHEN 325 MG PO TABS: 650.0000 mg | ORAL_TABLET | Freq: Four times a day (QID) | ORAL | Status: DC | PRN

## 2020-03-14 MED ORDER — SODIUM CHLORIDE 0.9 % IV SOLN
10.0000 mL/h | Freq: Once | INTRAVENOUS | Status: AC
  Administered 2020-03-14: 10 mL/h via INTRAVENOUS

## 2020-03-14 MED ORDER — PANTOPRAZOLE SODIUM 40 MG IV SOLR
40.0000 mg | Freq: Once | INTRAVENOUS | Status: AC
  Administered 2020-03-14: 40 mg via INTRAVENOUS
  Filled 2020-03-14: qty 40

## 2020-03-14 MED ORDER — SODIUM CHLORIDE 0.9 % IV BOLUS
500.0000 mL | Freq: Once | INTRAVENOUS | Status: AC
  Administered 2020-03-14: 500 mL via INTRAVENOUS

## 2020-03-14 NOTE — ED Notes (Signed)
Pt given a meal tray 

## 2020-03-14 NOTE — H&P (Signed)
History and Physical    Nicole Horn QPR:[id number] DOB: [birth year] DOA: 03/14/2020  PCP: [name], NP  Patient coming from: Home  I have personally briefly reviewed patient's old medical records in [place]  Chief Complaint: Fatigue  HPI: Nicole Horn is a 74 y.o. female with medical history significant of coronary artery disease, peripheral vascular disease, hypertension, iron deficiency anemia, has been feeling increasingly weak, fatigue for the last several weeks.  She denies any chest pain or shortness of breath.  She is seen her primary care physician yesterday and was found to have severe anemia with a hemoglobin of 6.3.  She did report some dark stools in the past few weeks, but nothing recently.  She reports that she stopped taking her oral iron supplementation approximately a month ago.  She has not had any hematuria.  No hematochezia.  ED Course: She was while in the emergency room her hemoglobin is noted to be 6.  Stool occult blood was sent to be negative, but per ER physician it was a poor sample.  Iron study shows severe iron deficiency.  2 units of PRBC were ordered.  She been referred for admission.  Review of Systems: As per HPI otherwise 10 point review of systems negative.    Past Medical History:  Diagnosis Date  . 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 Cough 11/13/2019  . Essential hypertension   . Exposure to sexually transmitted disease (STD) 12/11/2019  . Heart attack [facility]) 2004   [place] - hospitalized with stress test by report and presumably medical therapy; subsequent cardiac catheterization (no PCI)  . History of COVID-19 12/11/2019  . History of syphilis 12/11/2019  . Mixed hyperlipidemia   . PAD (peripheral artery disease) ([place]) 2019   PTCA/stent [place]  . Pneumonia due to COVID-19 virus 09/10/2019  . Sleep apnea    Pt supposed to wear CPAP, but doesn't.  . Type 2 diabetes mellitus ([place])     Past Surgical  History:  Procedure Laterality Date  . ABDOMINAL HYSTERECTOMY    . BACK SURGERY    . BARTHOLIN GLAND CYST EXCISION Left 03/04/2015   Procedure: EXCISION OF LEFT BARTHOLINS TUMOR;  Surgeon: [name], MD;  Location: [facility] ORS;  Service: Gynecology;  Laterality: Left;  . CERVICAL SPINE SURGERY    . COLONOSCOPY N/A 02/01/2020   Procedure: COLONOSCOPY;  Surgeon: Daneil Dolin, MD;  Location: [facility] ENDO SUITE;  Service: Endoscopy;  Laterality: N/A;  12:15pm  . CYST REMOVAL TRUNK    . Multiple cyst removal surgeries    . Stent of lower extremities      Social History:  reports that she quit smoking about 35 years ago. Her smoking use included cigarettes. She has a 50.00 pack-year smoking history. She has never used smokeless tobacco. She reports previous alcohol use. She reports that she does not use drugs.  No Known Allergies  Family History  Problem Relation Age of Onset  . Colon cancer Mother        In her 40s  . Breast cancer Sister   . Stomach cancer Maternal Aunt      Prior to Admission medications   Medication Sig Start Date End Date Taking? Authorizing Provider  albuterol (VENTOLIN HFA) 108 (90 Base) MCG/ACT inhaler Inhale 2 puffs into the lungs every 4 (four) hours as needed for wheezing or shortness of breath.   Yes [provider]  amLODipine (NORVASC) 10 MG tablet Take 1 tablet (10  mg total) by mouth daily. 01/29/20  Yes [name], NP  aspirin EC 81 MG tablet Take 81 mg by mouth daily.   Yes [provider]  clopidogrel (PLAVIX) 75 MG tablet Take 1 tablet (75 mg total) by mouth daily. 03/05/20  Yes [name], NP  Multiple Vitamin (MULTIVITAMIN WITH MINERALS) TABS tablet Take 1 tablet by mouth daily.   Yes [provider]  olmesartan-hydrochlorothiazide (BENICAR HCT) 20-12.5 MG tablet Take 1 tablet by mouth daily. 03/10/20  Yes Tanda Rockers, MD  rosuvastatin (CRESTOR) 40 MG tablet Take 1 tablet (40 mg total) by mouth daily. 02/29/20  Yes  [name], NP    Physical Exam: Vitals:   03/14/20 1600 03/14/20 1648 03/14/20 1700 03/14/20 1803  BP: (!) 146/60 (!) 146/60 (!) 152/65 (!) 132/92  Pulse: 77 77 71 82  Resp: 16 16 21  (!) 24  Temp:  97.9 F (36.6 C)    TempSrc:  Oral    SpO2: 98% 99% 97% 100%  Weight:      Height:        Constitutional: NAD, calm, comfortable Eyes: PERRL, lids and conjunctivae normal ENMT: Mucous membranes are moist. Posterior pharynx clear of any exudate or lesions.Normal dentition.  Neck: normal, supple, no masses, no thyromegaly Respiratory: clear to auscultation bilaterally, no wheezing, no crackles. Normal respiratory effort. No accessory muscle use.  Cardiovascular: Regular rate and rhythm, no murmurs / rubs / gallops. No extremity edema. 2+ pedal pulses. No carotid bruits.  Abdomen: no tenderness, no masses palpated. No hepatosplenomegaly. Bowel sounds positive.  Musculoskeletal: no clubbing / cyanosis. No joint deformity upper and lower extremities. Good ROM, no contractures. Normal muscle tone.  Skin: no rashes, lesions, ulcers. No induration Neurologic: CN 2-12 grossly intact. Sensation intact, DTR normal. Strength 5/5 in all 4.  Psychiatric: Normal judgment and insight. Alert and oriented x 3. Normal mood.    Labs on Admission: I have personally reviewed following labs and imaging studies  CBC: Recent Labs  Lab 03/13/20 1516 03/14/20 0940  WBC 7.6 5.0  NEUTROABS  --  2.5  HGB 6.3* 6.0*  HCT 24.3* 22.5*  MCV 66* 64.7*  PLT 400 742   Basic Metabolic Panel: Recent Labs  Lab 03/13/20 1516 03/14/20 0940  NA 143 142  K 4.1 3.7  CL 106 108  CO2 25 25  GLUCOSE 78 107*  BUN 17 17  CREATININE 1.37* 1.21*  CALCIUM 9.6 8.9   GFR: Estimated Creatinine Clearance: 35.6 mL/min (A) (by C-G formula based on SCr of 1.21 mg/dL (H)). Liver Function Tests: Recent Labs  Lab 03/13/20 1516 03/14/20 0940  AST 21 25  ALT 16 18  ALKPHOS 72 57  BILITOT 0.2 0.4  PROT 7.0 7.1   ALBUMIN 4.3 3.9   No results for input(s): LIPASE, AMYLASE in the last 168 hours. No results for input(s): AMMONIA in the last 168 hours. Coagulation Profile: No results for input(s): INR, PROTIME in the last 168 hours. Cardiac Enzymes: No results for input(s): CKTOTAL, CKMB, CKMBINDEX, TROPONINI in the last 168 hours. BNP (last 3 results) No results for input(s): PROBNP in the last 8760 hours. HbA1C: No results for input(s): HGBA1C in the last 72 hours. CBG: No results for input(s): GLUCAP in the last 168 hours. Lipid Profile: No results for input(s): CHOL, HDL, LDLCALC, TRIG, CHOLHDL, LDLDIRECT in the last 72 hours. Thyroid Function Tests: No results for input(s): TSH, T4TOTAL, FREET4, T3FREE, THYROIDAB in the last 72 hours. Anemia Panel:  Recent Labs    03/14/20 0940  VITAMINB12 420  FOLATE 40.0  FERRITIN 3*  TIBC 509*  IRON 16*  RETICCTPCT 1.4   Urine analysis:    Component Value Date/Time   COLORURINE YELLOW 02/28/2015 1011   APPEARANCEUR CLEAR 02/28/2015 1011   LABSPEC 1.025 02/28/2015 1011   PHURINE 5.5 02/28/2015 1011   GLUCOSEU NEGATIVE 02/28/2015 1011   HGBUR NEGATIVE 02/28/2015 1011   BILIRUBINUR NEGATIVE 02/28/2015 1011   KETONESUR NEGATIVE 02/28/2015 1011   PROTEINUR NEGATIVE 02/28/2015 1011   UROBILINOGEN 0.2 02/28/2015 1011   NITRITE NEGATIVE 02/28/2015 1011   LEUKOCYTESUR NEGATIVE 02/28/2015 1011    Radiological Exams on Admission: No results found.   Assessment/Plan Active Problems:   Essential hypertension   Hyperlipemia   Symptomatic anemia   PVD (peripheral vascular disease) (HCC)   CAD (coronary artery disease)   Iron deficiency anemia     1. Symptomatic anemia, iron deficiency anemia.  Patient has severe iron deficiency with associated anemia.  She does not have any current evidence of bleeding, but may have had some bleeding episodes recently.  She has been seen by GI and has a follow-up appointment scheduled on Monday.  She has  been transfused 2 units of PRBC.  Will resume oral iron on discharge. 2. Coronary artery disease/peripheral vascular disease.  She reports having stent placement in her lower extremity approximately 2 years ago.  She is not had any recent interventions.  Discussed with Dr. Harl Bowie, cardiology who felt that if she did not have any recent stents, will be reasonable to hold aspirin and Plavix for now until she can be evaluated by GI.  Plans will be to follow-up with Dr. Domenic Polite in the outpatient setting to discuss need for further antiplatelet agents.  Of note, she had ABIs performed there was noted to be greater than 1 bilaterally. 3. Hypertension.  Continue on Norvasc.  We will hold olmesartan/hydrochlorothiazide for now. 4. Hyperlipidemia.  Continue statin  DVT prophylaxis: SCDs Code Status: Full code Family Communication: Discussed with patient Disposition Plan: Discharge home in a.m. if labs are stable Consults called: Gastroenterology Admission status: Observation, telemetry  Kathie Dike MD [facility]   If 7PM-7AM, please contact night-coverage [email or web address]   03/14/2020, 7:55 PM

## 2020-03-14 NOTE — Consult Note (Addendum)
Gastroenterology attending attestation note: I personally reviewed the medical chart and evaluated the patient.  Briefly, this is a 74 year old female with past medical history of myocardial infarction 3 medically, history of COVID-19, hyperlipidemia, peripheral artery disease status post stent placement, chronic iron deficiency anemia and type 2 diabetes who was referred to our hospital from her primary care physician after being found to have worsening anemia of 6.3.  The patient denies having any overt symptoms of gastrointestinal bleeding.  She had a relatively recent colonoscopy on 01/31/2010 which showed diverticulosis but no other alterations.  She endorses having previous episodes of melena many months ago but not recently.  Has not had an EGD.  She has been taking oral iron supplementation and has been following in the GI clinic.  The patient was found to have severe microcytic anemia consistent with iron deficiency given her iron stores.  However, her BUN is normal and she does not have any clinical evidence of ongoing gastrointestinal bleeding.  She is presenting symptomatic anemia as she has had some lightheadedness and fatigue.  At this moment, she will benefit from getting transfused 2 units PRBC to optimize her H&H and monitor if she presents any episodes of overt gastrointestinal bleeding.  She will require an outpatient EGD soon, which will be scheduled.  Importantly, the patient has been on dual antiplatelets for long-term -we will recommend primary team to discuss with cardiology if Plavix could be discontinued as the stents have been placed more than a year ago.  If outpatient endoscopic investigations are negative she presents persist anemia, she will need to undergo a capsule endoscopy.  Harvel Quale, MD Gastroenterology and Hepatology [facility]   Referring Provider: [facility] Primary Care Physician:  [name],  NP Primary Gastroenterologist:  Dr. Gala Romney  Date of Admission: 03/14/20 Date of Consultation: 03/14/20  Reason for Consultation:  Acute on chronic anemia  HPI:  Denesia Donelan is a 74 y.o. female with a past medical history of MI in [place] on presumable medical therapy with cardiac catheterization but no PCI, history of COVID-19, hyperlipidemia, PAD with PTCA/stent also completed in [place], type 2 diabetes.  The patient was seen in our office 01/02/2020 for microcytic anemia as well as history of adenomatous colon polyps, family history of colon cancer, rectal bleeding.  At that time complained of several weeks of black stools, placed on iron due to anemia but still felt stools are darker than normal.  Scant bright red blood per rectum as well.  Heme-negative in the ED at a previous presentation.  Hemoglobin at that time was also 9.2, hemoglobin in January 2021 was 11.  [place], no history of EGD.  Mother with colon cancer in her 81s.  Recommended colonoscopy for further evaluation, update labs.  Unfortunately, her CBC and iron studies were not completed at that time.  Colonoscopy was completed 02/01/2020 which found diverticulosis in the sigmoid and descending colon, otherwise normal exam.  Recommended to continue current medications, no need for repeat colonoscopy due to age unless new symptoms develop, follow-up in 6 weeks in the office with repeat CBC.  Her follow-up visit in our office is scheduled for this coming Monday.  The patient presented to the emergency room on referral from primary care due to weakness and acute on chronic anemia with a hemoglobin of 6.3, on Plavix and aspirin due to history of CAD.  Notes no black stools in the past  month.  Rectal exam with a minimal amount of stool but heme negative.  CBC in the ED confirmed hemoglobin 6.0 which was microcytic and hypochromic, normal platelets, normal white blood cell count.   Creatinine elevated at 1.21 but within baseline over the past 3 months.  LFTs normal.  B12 and folate normal.  Iron low at 16, saturation is low at 3%, ferritin low at 3 (6 months prior her ferritin was 34).  Reticulocyte count normal.  SARS-CoV-2 negative.  Today she states she's doing ok overall. Has had weakness, fatigue recently as well as lightheadedness and DOE. Denies chest pain. One episode of nausea this week, no vomiting. Denies any recent melena or hematochezia. Denies GERD symptoms. Denies NSAIDs recently. Is on Plavix and ASA. No ASA powders. Denies any bleeding of any kind. Denies fever, chills, unintentional weight loss. No other GI complaints.  Denies URI or flu-like symptoms. Denies loss of sense of taste or smell. The patient has received COVID-19 vaccination(s).  Past Medical History:  Diagnosis Date  . 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 Cough 11/13/2019  . Essential hypertension   . Exposure to sexually transmitted disease (STD) 12/11/2019  . Heart attack [facility]) 2004   [place] - hospitalized with stress test by report and presumably medical therapy; subsequent cardiac catheterization (no PCI)  . History of COVID-19 12/11/2019  . History of syphilis 12/11/2019  . Mixed hyperlipidemia   . PAD (peripheral artery disease) ([place]) 2019   PTCA/stent [place]  . Pneumonia due to COVID-19 virus 09/10/2019  . Sleep apnea    Pt supposed to wear CPAP, but doesn't.  . Type 2 diabetes mellitus ([place])     Past Surgical History:  Procedure Laterality Date  . ABDOMINAL HYSTERECTOMY    . BACK SURGERY    . BARTHOLIN GLAND CYST EXCISION Left 03/04/2015   Procedure: EXCISION OF LEFT BARTHOLINS TUMOR;  Surgeon: [name], MD;  Location: [facility] ORS;  Service: Gynecology;  Laterality: Left;  . CERVICAL SPINE SURGERY    . COLONOSCOPY N/A 02/01/2020   Procedure: COLONOSCOPY;  Surgeon: Daneil Dolin, MD;  Location: [facility] ENDO SUITE;  Service: Endoscopy;  Laterality: N/A;  12:15pm  .  CYST REMOVAL TRUNK    . Multiple cyst removal surgeries    . Stent of lower extremities      Prior to Admission medications   Medication Sig Start Date End Date Taking? Authorizing Provider  albuterol (VENTOLIN HFA) 108 (90 Base) MCG/ACT inhaler Inhale 2 puffs into the lungs every 4 (four) hours as needed for wheezing or shortness of breath.   Yes [provider]  amLODipine (NORVASC) 10 MG tablet Take 1 tablet (10 mg total) by mouth daily. 01/29/20  Yes [name], NP  aspirin EC 81 MG tablet Take 81 mg by mouth daily.   Yes [provider]  clopidogrel (PLAVIX) 75 MG tablet Take 1 tablet (75 mg total) by mouth daily. 03/05/20  Yes [name], NP  Multiple Vitamin (MULTIVITAMIN WITH MINERALS) TABS tablet Take 1 tablet by mouth daily.   Yes [provider]  olmesartan-hydrochlorothiazide (BENICAR HCT) 20-12.5 MG tablet Take 1 tablet by mouth daily. 03/10/20  Yes Tanda Rockers, MD  rosuvastatin (CRESTOR) 40 MG tablet Take 1 tablet (40 mg total) by mouth daily. 02/29/20  Yes [name], NP    No current facility-administered medications for this encounter.   Current Outpatient Medications  Medication Sig Dispense Refill  . albuterol (VENTOLIN  HFA) 108 (90 Base) MCG/ACT inhaler Inhale 2 puffs into the lungs every 4 (four) hours as needed for wheezing or shortness of breath.    Marland Kitchen amLODipine (NORVASC) 10 MG tablet Take 1 tablet (10 mg total) by mouth daily. 90 tablet 1  . aspirin EC 81 MG tablet Take 81 mg by mouth daily.    . clopidogrel (PLAVIX) 75 MG tablet Take 1 tablet (75 mg total) by mouth daily. 90 tablet 0  . Multiple Vitamin (MULTIVITAMIN WITH MINERALS) TABS tablet Take 1 tablet by mouth daily.    Marland Kitchen olmesartan-hydrochlorothiazide (BENICAR HCT) 20-12.5 MG tablet Take 1 tablet by mouth daily. 30 tablet 11  . rosuvastatin (CRESTOR) 40 MG tablet Take 1 tablet (40 mg total) by mouth daily. 90 tablet 0    Allergies as of 03/14/2020  . (No Known  Allergies)    Family History  Problem Relation Age of Onset  . Colon cancer Mother        In her 81s  . Breast cancer Sister   . Stomach cancer Maternal Aunt     Social History   Socioeconomic History  . Marital status: Widowed    Spouse name: Not on file  . Number of children: 1  . Years of education: Not on file  . Highest education level: Some college, no degree  Occupational History  . Not on file  Tobacco Use  . Smoking status: Former Smoker    Packs/day: 2.00    Years: 25.00    Pack years: 50.00    Types: Cigarettes    Quit date: 03/10/1985    Years since quitting: 35.0  . Smokeless tobacco: Never Used  Vaping Use  . Vaping Use: Never used  Substance and Sexual Activity  . Alcohol use: Not Currently    Alcohol/week: 0.0 standard drinks    Comment: socially  . Drug use: No  . Sexual activity: Yes    Birth control/protection: Surgical  Other Topics Concern  . Not on file  Social History Narrative   Lives in [place] months here and [place] owns an apt in [place] alone, but helps to care for mother who is 7 years (goes between the kids)   Sister is in [place] is in [place]      Son who is 76 years old lives in [place], [place]    Has 6 grand kids and 7 great grands      Enjoys exercise (harder since COVID)-dancing      Diet: eats all foods-enjoys smoothies, avoids fried foods, limited red meat if any   Caffeine: coffee 1 cup in the morning-sometimes will have 1 in evening    Water: 3-4 bottles daily       Wears seat belt   Smoke detectors in home   Does not use phone while driving            Social Determinants of Health   Financial Resource Strain: Low Risk   . Difficulty of Paying Living Expenses: Not very hard  Food Insecurity: No Food Insecurity  . Worried About Charity fundraiser in the Last Year: Never true  . Ran Out of Food in the Last Year: Never true  Transportation Needs: No Transportation Needs  .  Lack of Transportation (Medical): No  . Lack of Transportation (Non-Medical): No  Physical Activity: Inactive  . Days of Exercise per Week: 0 days  . Minutes of Exercise per Session:  0 min  Stress: No Stress Concern Present  . Feeling of Stress : Not at all  Social Connections: Socially Isolated  . Frequency of Communication with Friends and Family: More than three times a week  . Frequency of Social Gatherings with Friends and Family: More than three times a week  . Attends Religious Services: Never  . Active Member of Clubs or Organizations: No  . Attends Archivist Meetings: Never  . Marital Status: Widowed  Intimate Partner Violence: Not At Risk  . Fear of Current or Ex-Partner: No  . Emotionally Abused: No  . Physically Abused: No  . Sexually Abused: No    Review of Systems: General: Negative for anorexia, weight loss, fever, chills. Eyes: Negative for vision changes.  ENT: Negative for hoarseness, difficulty swallowing , nasal congestion. CV: Negative for chest pain, angina, palpitations, dyspnea on exertion, peripheral edema.  Respiratory: Negative for dyspnea at rest, dyspnea on exertion, cough, sputum, wheezing.  GI: See history of present illness. GU:  Negative for dysuria, hematuria, urinary incontinence, urinary frequency, nocturnal urination.  MS: Negative for joint pain, low back pain.  Derm: Negative for rash or itching.  Neuro: Negative for weakness, abnormal sensation, seizure, frequent headaches, memory loss, confusion.  Psych: Negative for anxiety, depression, suicidal ideation, hallucinations.  Endo: Negative for unusual weight change.  Heme: Negative for bruising or bleeding. Allergy: Negative for rash or hives.  Physical Exam: Vital signs in last 24 hours: Temp:  [97.4 F (36.3 C)-98.3 F (36.8 C)] 98 F (36.7 C) (07/23 1200) Pulse Rate:  [67-86] 78 (07/23 1200) Resp:  [16-24] 18 (07/23 1200) BP: (111-154)/(54-64) 154/64 (07/23 1200) SpO2:   [99 %-100 %] 100 % (07/23 1200) Weight:  [65.8 kg-66.7 kg] 65.8 kg (07/23 0900)   General:   Alert,  Well-developed, well-nourished, pleasant and cooperative in NAD Head:  Normocephalic and atraumatic. Eyes:  Sclera clear, no icterus.   Conjunctiva pink. Ears:  Normal auditory acuity. Nose:  No deformity, discharge,  or lesions. Mouth:  No deformity or lesions, dentition normal. Neck:  Supple; no masses or thyromegaly. Lungs:  Clear throughout to auscultation.   No wheezes, crackles, or rhonchi. No acute distress. Heart:  Regular rate and rhythm; no murmurs, clicks, rubs,  or gallops. Abdomen:  Soft, nontender and nondistended. No masses, hepatosplenomegaly or hernias noted. Normal bowel sounds, without guarding, and without rebound.   Rectal:  Deferred until time of colonoscopy.   Msk:  Symmetrical without gross deformities. Normal posture. Pulses:  Normal pulses noted. Extremities:  Without clubbing or edema. Neurologic:  Alert and  oriented x4;  grossly normal neurologically. Skin:  Intact without significant lesions or rashes. Cervical Nodes:  No significant cervical adenopathy. Psych:  Alert and cooperative. Normal mood and affect.  Intake/Output from previous day: No intake/output data recorded. Intake/Output this shift: Total I/O In: 500 [IV Piggyback:500] Out: -   Lab Results: Recent Labs    03/13/20 1516 03/14/20 0940  WBC 7.6 5.0  HGB 6.3* 6.0*  HCT 24.3* 22.5*  PLT 400 347   BMET Recent Labs    03/13/20 1516 03/14/20 0940  NA 143 142  K 4.1 3.7  CL 106 108  CO2 25 25  GLUCOSE 78 107*  BUN 17 17  CREATININE 1.37* 1.21*  CALCIUM 9.6 8.9   LFT Recent Labs    03/13/20 1516 03/14/20 0940  PROT 7.0 7.1  ALBUMIN 4.3 3.9  AST 21 25  ALT 16 18  ALKPHOS 72 57  BILITOT 0.2 0.4   PT/INR No results for input(s): LABPROT, INR in the last 72 hours. Hepatitis Panel No results for input(s): HEPBSAG, HCVAB, HEPAIGM, HEPBIGM in the last 72  hours. C-Diff No results for input(s): CDIFFTOX in the last 72 hours.  Studies/Results: No results found.  Impression: Pleasant 74 year old female who presents on referral from primary care for acute on chronic anemia with significant drop in hemoglobin to 6.0 which is microcytic, hypochromic.  Iron sats, iron, ferritin also quite low.  Folate and B12 normal.  Overall picture of acute on chronic iron deficiency anemia in the setting of Plavix and aspirin for history of CAD with no obvious significant GI bleeding and heme-negative stool on exam.  Colonoscopy recently updated and essentially unremarkable as outlined above.  No history of EGD or small bowel evaluation.  Acute on chronic IDA- Admitting hgb 6.0, no obvious source for bleeding. Heme negative stool on EDP exam. Denies melena/hematochezia. No GERD symptoms. No NSAIDs. Is on Plavix and ASA for history of CAD (medical therapy, no PCI) as well as PVD s/p stenting both remotely in [place]. Recent cardiology evaluation with ABI > 1. Recent colonoscopy unremarkable (01/2020). No history of EGD. Differentials include UGI or small bowel bleed including AVM, esophagitis, gastritis, duodenitis, PUD, less likely malignancy. Should would benefit from EGD +/- capsule endoscopy, but not necessarily needed as IP.  Discussed with Dr. Roderic Palau and Dr. Harl Bowie with cardiology who feels it's ok to hold her Plavix and ASA in the current situation.  Plan: 1. Transfuse 2 units as planned 2. Monitor hgb 3. Monitor for obvious GI bleed 4. If acute change and needs endoscopic evaluation this weekend will need transfer to [place] (No [facility] GI coverage this weekend) 5. Has appointment in our office Monday 7/26; can plan for OP EGD at that time 6. Supportive measures   Thank you for allowing [place] to participate in the care of [facility]  Walden Field, DNP, AGNP-C Adult & Gerontological Nurse Practitioner [facility]   LOS: 0 days      03/14/2020, 2:18 PM

## 2020-03-14 NOTE — ED Notes (Signed)
Pt given meal tray.

## 2020-03-14 NOTE — ED Notes (Signed)
Bedside commode placed at bedside.  

## 2020-03-14 NOTE — ED Provider Notes (Signed)
GI has been consulted and they stated they will come see the patient in the emergency department   Milton Ferguson, MD 03/14/20 1337

## 2020-03-14 NOTE — Telephone Encounter (Signed)
error 

## 2020-03-14 NOTE — ED Notes (Signed)
CRITICAL VALUE ALERT  Critical Value:  Hg 6.0   Date & Time Notied: 03/14/20 1038  Provider Notified: Charline Bills   Orders Received/Actions taken: None yet

## 2020-03-14 NOTE — ED Notes (Signed)
ED TO INPATIENT HANDOFF REPORT  ED Nurse Name and Phone #:  Fabio Neighbors [phone]  S Name/Age/Gender Nicole Horn 74 y.o. female Room/Bed: APA07/APA07  Code Status   Code Status: Prior  Home/SNF/Other Home Patient oriented to: self, place, time and situation Is this baseline? Yes   Triage Complete: Triage complete  Chief Complaint Symptomatic anemia [D64.9]  Triage Note Pt sent by PCP Cherly Beach, PA) for abnormal labs "low blood" per pt.  Pt reports feeling tired for about one month.  Denies any pain or discomfort.       Allergies No Known Allergies  Level of Care/Admitting Diagnosis ED Disposition    ED Disposition Condition Comment   Admit  Hospital Area: [facility] [[id number]]  Level of Care: Telemetry [5]  Covid Evaluation: Confirmed COVID Negative  Diagnosis: Symptomatic anemia [1540086]  Admitting Physician: Kathie Dike [[id number]]  Attending Physician: Kathie Dike [[id number]]       B Medical/Surgery History Past Medical History:  Diagnosis Date  . 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 Cough 11/13/2019  . Essential hypertension   . Exposure to sexually transmitted disease (STD) 12/11/2019  . Heart attack [facility]) 2004   [place] - hospitalized with stress test by report and presumably medical therapy; subsequent cardiac catheterization (no PCI)  . History of COVID-19 12/11/2019  . History of syphilis 12/11/2019  . Mixed hyperlipidemia   . PAD (peripheral artery disease) ([place]) 2019   PTCA/stent [place]  . Pneumonia due to COVID-19 virus 09/10/2019  . Sleep apnea    Pt supposed to wear CPAP, but doesn't.  . Type 2 diabetes mellitus ([place])    Past Surgical History:  Procedure Laterality Date  . ABDOMINAL HYSTERECTOMY    . BACK SURGERY    . BARTHOLIN GLAND CYST EXCISION Left 03/04/2015   Procedure: EXCISION OF LEFT BARTHOLINS TUMOR;  Surgeon: [name], MD;  Location: [facility] ORS;  Service: Gynecology;  Laterality: Left;  .  CERVICAL SPINE SURGERY    . COLONOSCOPY N/A 02/01/2020   Procedure: COLONOSCOPY;  Surgeon: Daneil Dolin, MD;  Location: [facility] ENDO SUITE;  Service: Endoscopy;  Laterality: N/A;  12:15pm  . CYST REMOVAL TRUNK    . Multiple cyst removal surgeries    . Stent of lower extremities       A IV Location/Drains/Wounds Patient Lines/Drains/Airways Status    Active Line/Drains/Airways    Name Placement date Placement time Site Days   Peripheral IV 03/14/20 Right Antecubital 03/14/20  0953  Antecubital  less than 1   External Urinary Catheter 03/14/20  1455  --  less than 1   Incision (Closed) 03/04/15 Vagina Other (Comment) 03/04/15  0823   1837          Intake/Output Last 24 hours  Intake/Output Summary (Last 24 hours) at 03/14/2020 1837 Last data filed at 03/14/2020 1650 Gross per 24 hour  Intake 1810 ml  Output --  Net 1810 ml    Labs/Imaging Results for orders placed or performed during the hospital encounter of 03/14/20 (from the past 48 hour(s))  POC occult blood, ED     Status: None   Collection Time: 03/14/20  9:08 AM  Result Value Ref Range   Fecal Occult Bld NEGATIVE NEGATIVE  CBC with Differential/Platelet     Status: Abnormal   Collection Time: 03/14/20  9:40 AM  Result Value Ref Range   WBC 5.0 4.0 - 10.5 K/uL   RBC 3.48 (L) 3.87 - 5.11 MIL/uL  Hemoglobin 6.0 (LL) 12.0 - 15.0 g/dL    Comment: REPEATED TO VERIFY THIS CRITICAL RESULT HAS VERIFIED AND BEEN CALLED TO FARMER E BY LATISHA HENDERSON ON 07 23 2021 AT 1032, AND HAS BEEN READ BACK.     HCT 22.5 (L) 36 - 46 %   MCV 64.7 (L) 80.0 - 100.0 fL   MCH 17.2 (L) 26.0 - 34.0 pg   MCHC 26.7 (L) 30.0 - 36.0 g/dL   RDW 20.7 (H) 11.5 - 15.5 %   Platelets 347 150 - 400 K/uL   nRBC 0.0 0.0 - 0.2 %   Neutrophils Relative % 49 %   Neutro Abs 2.5 1.7 - 7.7 K/uL   Lymphocytes Relative 33 %   Lymphs Abs 1.7 0.7 - 4.0 K/uL   Monocytes Relative 12 %   Monocytes Absolute 0.6 0 - 1 K/uL   Eosinophils Relative 5 %    Eosinophils Absolute 0.3 0 - 0 K/uL   Basophils Relative 1 %   Basophils Absolute 0.0 0 - 0 K/uL   Immature Granulocytes 0 %   Abs Immature Granulocytes 0.02 0.00 - 0.07 K/uL   Polychromasia PRESENT    Spherocytes PRESENT     Comment: Performed at [facility], [address], [place], [place] [address]  Comprehensive metabolic panel     Status: Abnormal   Collection Time: 03/14/20  9:40 AM  Result Value Ref Range   Sodium 142 135 - 145 mmol/L   Potassium 3.7 3.5 - 5.1 mmol/L   Chloride 108 98 - 111 mmol/L   CO2 25 22 - 32 mmol/L   Glucose, Bld 107 (H) 70 - 99 mg/dL    Comment: Glucose reference range applies only to samples taken after fasting for at least 8 hours.   BUN 17 8 - 23 mg/dL   Creatinine, Ser 1.21 (H) 0.44 - 1.00 mg/dL   Calcium 8.9 8.9 - 10.3 mg/dL   Total Protein 7.1 6.5 - 8.1 g/dL   Albumin 3.9 3.5 - 5.0 g/dL   AST 25 15 - 41 U/L   ALT 18 0 - 44 U/L   Alkaline Phosphatase 57 38 - 126 U/L   Total Bilirubin 0.4 0.3 - 1.2 mg/dL   GFR calc non Af Amer 45 (L) >60 mL/min   GFR calc Af Amer 52 (L) >60 mL/min   Anion gap 9 5 - 15    Comment: Performed at [facility], [address], [place], [place] [address]  Type and screen     Status: None (Preliminary result)   Collection Time: 03/14/20  9:40 AM  Result Value Ref Range   ABO/RH(D) B POS    Antibody Screen NEG    Sample Expiration 03/17/2020,2359    Unit Number [id number]    Blood Component Type RBC LR PHER1    Unit division 00    Status of Unit ISSUED    Transfusion Status OK TO TRANSFUSE    Crossmatch Result      Compatible Performed at [facility], [address] [place], [place] [address]    Unit Number [id number]    Blood Component Type RED CELLS,LR    Unit division 00    Status of Unit ISSUED    Transfusion Status OK TO TRANSFUSE    Crossmatch Result Compatible   Vitamin B12     Status: None   Collection Time: 03/14/20  9:40 AM  Result Value Ref Range   Vitamin B-12 420 180 - 914 pg/mL  Comment: (NOTE) This assay is not validated for testing neonatal or myeloproliferative syndrome specimens for Vitamin B12 levels. Performed at [facility], [address], [place], [place] [address]   Folate     Status: None   Collection Time: 03/14/20  9:40 AM  Result Value Ref Range   Folate 40.0 >5.9 ng/mL    Comment: RESULTS CONFIRMED BY MANUAL DILUTION Performed at [facility], [address], [place], [place] [address]   Iron and TIBC     Status: Abnormal   Collection Time: 03/14/20  9:40 AM  Result Value Ref Range   Iron 16 (L) 28 - 170 ug/dL   TIBC 509 (H) 250 - 450 ug/dL   Saturation Ratios 3 (L) 10.4 - 31.8 %   UIBC 493 ug/dL    Comment: Performed at [facility], [address], [place], [place] [address]  Ferritin     Status: Abnormal   Collection Time: 03/14/20  9:40 AM  Result Value Ref Range   Ferritin 3 (L) 11 - 307 ng/mL    Comment: Performed at [facility], [address], [place], [place] [address]  Reticulocytes     Status: Abnormal   Collection Time: 03/14/20  9:40 AM  Result Value Ref Range   Retic Ct Pct 1.4 0.4 - 3.1 %   RBC. 3.48 (L) 3.87 - 5.11 MIL/uL   Retic Count, Absolute 49.1 19.0 - 186.0 K/uL   Immature Retic Fract 34.6 (H) 2.3 - 15.9 %    Comment: Performed at [facility], [address] [place], [place] [address]  Prepare RBC (crossmatch)     Status: None   Collection Time: 03/14/20 10:49 AM  Result Value Ref Range   Order Confirmation      ORDER PROCESSED BY BLOOD BANK Performed at [facility], [address] [place], [place] [address]   SARS Coronavirus 2 by RT PCR (hospital order, performed in [facility] hospital lab) Nasopharyngeal Nasopharyngeal Swab     Status: None   Collection Time: 03/14/20 11:25 AM   Specimen: Nasopharyngeal Swab  Result Value Ref Range   SARS Coronavirus 2 NEGATIVE NEGATIVE    Comment: (NOTE) SARS-CoV-2 target nucleic acids are NOT DETECTED.  The SARS-CoV-2 RNA is generally detectable in upper and  lower respiratory specimens during the acute phase of infection. The lowest concentration of SARS-CoV-2 viral copies this assay can detect is 250 copies / mL. A negative result does not preclude SARS-CoV-2 infection and should not be used as the sole basis for treatment or other patient management decisions.  A negative result may occur with improper specimen collection / handling, submission of specimen other than nasopharyngeal swab, presence of viral mutation(s) within the areas targeted by this assay, and inadequate number of viral copies (<250 copies / mL). A negative result must be combined with clinical observations, patient history, and epidemiological information.  Fact Sheet for Patients:   StrictlyIdeas.no  Fact Sheet for Healthcare Providers: BankingDealers.co.za  This test is not yet approved or  cleared by the [place] FDA and has been authorized for detection and/or diagnosis of SARS-CoV-2 by FDA under an Emergency Use Authorization (EUA).  This EUA will remain in effect (meaning this test can be used) for the duration of the COVID-19 declaration under Section 564(b)(1) of the Act, 21 U.S.C. section 360bbb-3(b)(1), unless the authorization is terminated or revoked sooner.  Performed at [facility], [address] [place], [place] [address]    No results found.  Pending Labs Unresulted  Labs (From admission, onward) Comment          Start     Ordered   03/14/20 0910  Occult blood card to lab, stool Provider will collect  Once,   STAT       Question:  Specimen to be collected by:  Answer:  Provider will collect   03/14/20 0909          Vitals/Pain Today's Vitals   03/14/20 1600 03/14/20 1648 03/14/20 1700 03/14/20 1803  BP: (!) 146/60 (!) 146/60 (!) 152/65 (!) 132/92  Pulse: 77 77 71 82  Resp: 16 16 21  (!) 24  Temp:  97.9 F (36.6 C)    TempSrc:  Oral    SpO2: 98% 99% 97% 100%  Weight:      Height:       PainSc:        Isolation Precautions No active isolations  Medications Medications  sodium chloride 0.9 % bolus 500 mL (0 mLs Intravenous Stopped 03/14/20 1146)  pantoprazole (PROTONIX) injection 40 mg (40 mg Intravenous Given 03/14/20 1021)  0.9 %  sodium chloride infusion (0 mL/hr Intravenous Stopped 03/14/20 1650)    Mobility walks Low fall risk   Focused Assessments    R Recommendations: See Admitting Provider Note  Report given to:   Additional Notes:

## 2020-03-14 NOTE — ED Triage Notes (Signed)
Pt sent by PCP Cherly Beach, PA) for abnormal labs "low blood" per pt.  Pt reports feeling tired for about one month.  Denies any pain or discomfort.

## 2020-03-14 NOTE — Progress Notes (Signed)
noted 

## 2020-03-14 NOTE — ED Provider Notes (Signed)
[facility] EMERGENCY DEPARTMENT Provider Note   CSN: [id number] Arrival date & time: 03/14/20  8676     History Chief Complaint  Patient presents with  . Abnormal Lab    Nicole Horn is a 74 y.o. female.  Patient states that she has been weak for the last week.  She was seen by her primary care doctor yesterday and they did a CBC that showed hemoglobin of 6.3.  Patient does take Plavix and aspirin.  She does have a history of coronary artery disease.  No black stools within the last month  The history is provided by the patient and medical records. No language interpreter was used.  Weakness Severity:  Moderate Onset quality:  Sudden Timing:  Constant Progression:  Worsening Chronicity:  Recurrent Context: not alcohol use   Relieved by:  Nothing Worsened by:  Nothing Ineffective treatments:  None tried Associated symptoms: no abdominal pain, no chest pain, no cough, no diarrhea, no frequency, no headaches and no seizures        Past Medical History:  Diagnosis Date  . 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 Cough 11/13/2019  . Essential hypertension   . Exposure to sexually transmitted disease (STD) 12/11/2019  . Heart attack [facility]) 2004   [place] - hospitalized with stress test by report and presumably medical therapy; subsequent cardiac catheterization (no PCI)  . History of COVID-19 12/11/2019  . History of syphilis 12/11/2019  . Mixed hyperlipidemia   . PAD (peripheral artery disease) ([place]) 2019   PTCA/stent [place]  . Pneumonia due to COVID-19 virus 09/10/2019  . Sleep apnea    Pt supposed to wear CPAP, but doesn't.  . Type 2 diabetes mellitus [facility])     Patient Active Problem List   Diagnosis Date Noted  . Dizziness 03/13/2020  . DOE (dyspnea on exertion) 03/10/2020  . Rectal bleeding 01/09/2020  . Microcytic anemia 01/02/2020  . H/O adenomatous polyp of colon 01/02/2020  . FH: colon cancer 01/02/2020  . Heart disease 12/11/2019  .  History of myocardial infarct at age less than 73 years 12/11/2019  . History of irregular heartbeat 12/11/2019  . Chronic thumb pain, right 11/13/2019  . Obesity (BMI 30-39.9) 10/16/2019  . Essential hypertension 09/10/2019  . Hyperlipemia 09/10/2019    Past Surgical History:  Procedure Laterality Date  . ABDOMINAL HYSTERECTOMY    . BACK SURGERY    . BARTHOLIN GLAND CYST EXCISION Left 03/04/2015   Procedure: EXCISION OF LEFT BARTHOLINS TUMOR;  Surgeon: [name], MD;  Location: [facility] ORS;  Service: Gynecology;  Laterality: Left;  . CERVICAL SPINE SURGERY    . COLONOSCOPY N/A 02/01/2020   Procedure: COLONOSCOPY;  Surgeon: Daneil Dolin, MD;  Location: [facility] ENDO SUITE;  Service: Endoscopy;  Laterality: N/A;  12:15pm  . CYST REMOVAL TRUNK    . Multiple cyst removal surgeries    . Stent of lower extremities       OB History    Gravida      Para      Term      Preterm      AB      Living  1     SAB      TAB      Ectopic      Multiple      Live Births              Family History  Problem Relation Age of Onset  . Colon cancer  Mother        In her 75s  . Breast cancer Sister   . Stomach cancer Maternal Aunt     Social History   Tobacco Use  . Smoking status: Former Smoker    Packs/day: 2.00    Years: 25.00    Pack years: 50.00    Types: Cigarettes    Quit date: 03/10/1985    Years since quitting: 35.0  . Smokeless tobacco: Never Used  Vaping Use  . Vaping Use: Never used  Substance Use Topics  . Alcohol use: Not Currently    Alcohol/week: 0.0 standard drinks    Comment: socially  . Drug use: No    Home Medications Prior to Admission medications   Medication Sig Start Date End Date Taking? Authorizing Provider  albuterol (VENTOLIN HFA) 108 (90 Base) MCG/ACT inhaler Inhale 2 puffs into the lungs every 4 (four) hours as needed for wheezing or shortness of breath.    [provider]  amLODipine (NORVASC) 10 MG tablet Take 1 tablet (10  mg total) by mouth daily. 01/29/20   [name], NP  aspirin EC 81 MG tablet Take 81 mg by mouth daily.    [provider]  clopidogrel (PLAVIX) 75 MG tablet Take 1 tablet (75 mg total) by mouth daily. 03/05/20   [name], NP  Multiple Vitamin (MULTIVITAMIN WITH MINERALS) TABS tablet Take 1 tablet by mouth daily.    [provider]  olmesartan-hydrochlorothiazide (BENICAR HCT) 20-12.5 MG tablet Take 1 tablet by mouth daily. 03/10/20   Tanda Rockers, MD  rosuvastatin (CRESTOR) 40 MG tablet Take 1 tablet (40 mg total) by mouth daily. 02/29/20   [name], NP    Allergies    Patient has no known allergies.  Review of Systems   Review of Systems  Constitutional: Negative for appetite change and fatigue.  HENT: Negative for congestion, ear discharge and sinus pressure.   Eyes: Negative for discharge.  Respiratory: Negative for cough.   Cardiovascular: Negative for chest pain.  Gastrointestinal: Negative for abdominal pain and diarrhea.  Genitourinary: Negative for frequency and hematuria.  Musculoskeletal: Negative for back pain.  Skin: Negative for rash.  Neurological: Positive for weakness. Negative for seizures and headaches.  Psychiatric/Behavioral: Negative for hallucinations.    Physical Exam Updated Vital Signs BP (!) 154/64   Pulse 78   Temp 98 F (36.7 C) (Oral)   Resp 18   Ht 5' (1.524 m)   Wt 65.8 kg   SpO2 100%   BMI 28.32 kg/m   Physical Exam Vitals and nursing note reviewed.  Constitutional:      Appearance: She is well-developed.  HENT:     Head: Normocephalic.     Nose: Nose normal.  Eyes:     General: No scleral icterus.    Conjunctiva/sclera: Conjunctivae normal.  Neck:     Thyroid: No thyromegaly.  Cardiovascular:     Rate and Rhythm: Normal rate and regular rhythm.     Heart sounds: No murmur heard.  No friction rub. No gallop.   Pulmonary:     Breath sounds: No stridor. No wheezing or rales.  Chest:     Chest  wall: No tenderness.  Abdominal:     General: There is no distension.     Tenderness: There is no abdominal tenderness. There is no rebound.  Genitourinary:    Comments: Rectal exam heme-negative but minimal amount of stool Musculoskeletal:  General: Normal range of motion.     Cervical back: Neck supple.  Lymphadenopathy:     Cervical: No cervical adenopathy.  Skin:    Findings: No erythema or rash.  Neurological:     Mental Status: She is alert and oriented to person, place, and time.     Motor: No abnormal muscle tone.     Coordination: Coordination normal.  Psychiatric:        Behavior: Behavior normal.     ED Results / Procedures / Treatments   Labs (all labs ordered are listed, but only abnormal results are displayed) Labs Reviewed  CBC WITH DIFFERENTIAL/PLATELET - Abnormal; Notable for the following components:      Result Value   RBC 3.48 (*)    Hemoglobin 6.0 (*)    HCT 22.5 (*)    MCV 64.7 (*)    MCH 17.2 (*)    MCHC 26.7 (*)    RDW 20.7 (*)    All other components within normal limits  COMPREHENSIVE METABOLIC PANEL - Abnormal; Notable for the following components:   Glucose, Bld 107 (*)    Creatinine, Ser 1.21 (*)    GFR calc non Af Amer 45 (*)    GFR calc Af Amer 52 (*)    All other components within normal limits  IRON AND TIBC - Abnormal; Notable for the following components:   Iron 16 (*)    TIBC 509 (*)    Saturation Ratios 3 (*)    All other components within normal limits  FERRITIN - Abnormal; Notable for the following components:   Ferritin 3 (*)    All other components within normal limits  RETICULOCYTES - Abnormal; Notable for the following components:   RBC. 3.48 (*)    Immature Retic Fract 34.6 (*)    All other components within normal limits  SARS CORONAVIRUS 2 BY RT PCR (HOSPITAL ORDER, [place] LAB)  VITAMIN B12  FOLATE  OCCULT BLOOD X 1 CARD TO LAB, STOOL  POC OCCULT BLOOD, ED  TYPE AND SCREEN   PREPARE RBC (CROSSMATCH)    EKG None  Radiology No results found.  Procedures Procedures (including critical care time)  Medications Ordered in ED Medications  sodium chloride 0.9 % bolus 500 mL (0 mLs Intravenous Stopped 03/14/20 1146)  pantoprazole (PROTONIX) injection 40 mg (40 mg Intravenous Given 03/14/20 1021)  0.9 %  sodium chloride infusion (10 mL/hr Intravenous New Bag/Given 03/14/20 1132)    ED Course  I have reviewed the triage vital signs and the nursing notes.  Pertinent labs & imaging results that were available during my care of the patient were reviewed by me and considered in my medical decision making (see chart for details).    CRITICAL CARE Performed by: Milton Ferguson Total critical care time: 35 minutes Critical care time was exclusive of separately billable procedures and treating other patients. Critical care was necessary to treat or prevent imminent or life-threatening deterioration. Critical care was time spent personally by me on the following activities: development of treatment plan with patient and/or surrogate as well as nursing, discussions with consultants, evaluation of patient's response to treatment, examination of patient, obtaining history from patient or surrogate, ordering and performing treatments and interventions, ordering and review of laboratory studies, ordering and review of radiographic studies, pulse oximetry and re-evaluation of patient's condition.  MDM Rules/Calculators/A&P  Patient with symptomatic anemia she will be transfused with GI consult and medicine admission         This patient presents to the ED for concern of weakness, this involves an extensive number of treatment options, and is a complaint that carries with it a high risk of complications and morbidity.  The differential diagnosis includes anemia MI PE   Lab Tests:   I Ordered, reviewed, and interpreted labs, which included CBC  and chemistries that showed a hemoglobin of 6.0  Medicines ordered:   I ordered medication blood for anemia  Imaging Studies ordered:  Additional history obtained:   Additional history obtained from records  Previous records obtained and reviewed.  Consultations Obtained:   I consulted hospitalist and GI and discussed lab and imaging findings  Reevaluation:  After the interventions stated above, I reevaluated the patient and found improved  Critical Interventions:  . Transfusion of blood  Final Clinical Impression(s) / ED Diagnoses Final diagnoses:  Symptomatic anemia    Rx / DC Orders ED Discharge Orders    None       Milton Ferguson, MD 03/14/20 1244

## 2020-03-15 ENCOUNTER — Observation Stay (HOSPITAL_COMMUNITY): Payer: Medicare Other

## 2020-03-15 DIAGNOSIS — I739 Peripheral vascular disease, unspecified: Secondary | ICD-10-CM | POA: Diagnosis not present

## 2020-03-15 DIAGNOSIS — D509 Iron deficiency anemia, unspecified: Secondary | ICD-10-CM | POA: Diagnosis not present

## 2020-03-15 DIAGNOSIS — R319 Hematuria, unspecified: Secondary | ICD-10-CM | POA: Diagnosis not present

## 2020-03-15 DIAGNOSIS — E785 Hyperlipidemia, unspecified: Secondary | ICD-10-CM | POA: Diagnosis not present

## 2020-03-15 DIAGNOSIS — I700 Atherosclerosis of aorta: Secondary | ICD-10-CM | POA: Diagnosis not present

## 2020-03-15 DIAGNOSIS — I10 Essential (primary) hypertension: Secondary | ICD-10-CM | POA: Diagnosis not present

## 2020-03-15 DIAGNOSIS — K5730 Diverticulosis of large intestine without perforation or abscess without bleeding: Secondary | ICD-10-CM | POA: Diagnosis not present

## 2020-03-15 DIAGNOSIS — D649 Anemia, unspecified: Secondary | ICD-10-CM | POA: Diagnosis not present

## 2020-03-15 LAB — URINALYSIS, ROUTINE W REFLEX MICROSCOPIC
Bacteria, UA: NONE SEEN
Bilirubin Urine: NEGATIVE
Glucose, UA: NEGATIVE mg/dL
Ketones, ur: NEGATIVE mg/dL
Leukocytes,Ua: NEGATIVE
Nitrite: NEGATIVE
Protein, ur: NEGATIVE mg/dL
Specific Gravity, Urine: 1.004 — ABNORMAL LOW (ref 1.005–1.030)
pH: 8 (ref 5.0–8.0)

## 2020-03-15 LAB — CBC
HCT: 29.8 % — ABNORMAL LOW (ref 36.0–46.0)
Hemoglobin: 8.4 g/dL — ABNORMAL LOW (ref 12.0–15.0)
MCH: 20.2 pg — ABNORMAL LOW (ref 26.0–34.0)
MCHC: 28.2 g/dL — ABNORMAL LOW (ref 30.0–36.0)
MCV: 71.6 fL — ABNORMAL LOW (ref 80.0–100.0)
Platelets: 304 10*3/uL (ref 150–400)
RBC: 4.16 MIL/uL (ref 3.87–5.11)
RDW: 25.3 % — ABNORMAL HIGH (ref 11.5–15.5)
WBC: 7.2 10*3/uL (ref 4.0–10.5)
nRBC: 0 % (ref 0.0–0.2)

## 2020-03-15 LAB — BPAM RBC
Blood Product Expiration Date: 202108172359
Blood Product Expiration Date: 202108282359
ISSUE DATE / TIME: 202107231139
ISSUE DATE / TIME: 202107231433
Unit Type and Rh: 1700
Unit Type and Rh: 9500

## 2020-03-15 LAB — BASIC METABOLIC PANEL
Anion gap: 7 (ref 5–15)
BUN: 17 mg/dL (ref 8–23)
CO2: 26 mmol/L (ref 22–32)
Calcium: 8.8 mg/dL — ABNORMAL LOW (ref 8.9–10.3)
Chloride: 111 mmol/L (ref 98–111)
Creatinine, Ser: 1.16 mg/dL — ABNORMAL HIGH (ref 0.44–1.00)
GFR calc Af Amer: 54 mL/min — ABNORMAL LOW (ref >60)
GFR calc non Af Amer: 47 mL/min — ABNORMAL LOW (ref >60)
Glucose, Bld: 98 mg/dL (ref 70–99)
Potassium: 3.7 mmol/L (ref 3.5–5.1)
Sodium: 144 mmol/L (ref 135–145)

## 2020-03-15 LAB — TYPE AND SCREEN
ABO/RH(D): B POS
Antibody Screen: NEGATIVE
Unit division: 0
Unit division: 0

## 2020-03-15 MED ORDER — CLOPIDOGREL BISULFATE 75 MG PO TABS: 75.0000 mg | ORAL_TABLET | Freq: Every day | ORAL | 0 refills | Status: DC

## 2020-03-15 MED ORDER — IOHEXOL 9 MG/ML PO SOLN
ORAL | Status: AC
  Filled 2020-03-15: qty 1000

## 2020-03-15 MED ORDER — FERROUS SULFATE 325 (65 FE) MG PO TBEC
325.0000 mg | DELAYED_RELEASE_TABLET | Freq: Two times a day (BID) | ORAL | 3 refills | Status: DC
Start: 2020-03-15 — End: 2020-03-27

## 2020-03-15 MED ORDER — ASPIRIN EC 81 MG PO TBEC: 81.0000 mg | DELAYED_RELEASE_TABLET | Freq: Every day | ORAL | 11 refills | Status: DC

## 2020-03-15 NOTE — Care Management Obs Status (Signed)
[place] NOTIFICATION   Patient Details  Name: Nicole Horn MRN: [id number] Date of Birth: June 23, 1946   Medicare Observation Status Notification Given:  Yes    Natasha Bence, LCSW 03/15/2020, 4:01 PM

## 2020-03-15 NOTE — Progress Notes (Signed)
Referring Provider: [name], NP Primary Care Physician:  [name], NP Primary GI Physician: Dr. Gala Romney  Chief Complaint  Patient presents with  . pp f/u    had some light pink bleeding couple days ago-not sure where blood came from. ASA and Plavix are on hold. Hasn't picked up Iron    HPI:   Nicole Horn is a 74 y.o. female presenting today for follow-up of microcytic anemia with history of rectal bleeding, personal history of adenomatous colon polyps, and family history of colon cancer.   Last seen in our office 01/02/2020.  Recent ED visit December 13, 2019 with complaints of several weeks of black stools.  Reported being placed on iron due to anemia but felt stools were darker than usual.  Also with faint amount of bright red blood per rectum.  She was heme-negative in the ED.  Hemoglobin 9.2 with MCV 78.6.  At her office visit, she [place] and recommendations to repeat in 5 years.  No prior EGD.  She was scheduled for colonoscopy in light of history of colon polyps and rectal bleeding.  Plans to update labs and resume oral iron.  Labs were not completed. Colonoscopy 02/01/2020: Scattered medium mouth diverticula in the sigmoid and descending colon, otherwise normal exam.  Recommended returning to the GI clinic in 6 weeks with CBC.  Patient was briefly admitted to the hospital 03/14/2020-03/15/2020 for symptomatic anemia.  She reported feeling increasingly weak with fatigue for the last several weeks.  Recently seen at her primary care's office and found to have hemoglobin of 6.3.  Reported dark stools the past 2 weeks but nothing recently.  Stopped taking oral iron supplementation approximately 1 month ago.  Repeat hemoglobin of 6, iron 16, saturation ratio 3%, ferritin 3.  Stool occult blood negative but per ER physician, it was a poor sample.  She reported occasional blood in her urine.  Urinalysis without signs of  infection and CT without worrisome lesions in kidneys or bladder.  Questioned some concurrent vaginal bleeding.  She is followed by Dr. Glo Herring with gynecology with recommendations to follow-up with him for further work-up.  Case was discussed with Dr. Harl Bowie, cardiology, who felt since patient did not have any recent stents, it would be reasonable to hold aspirin and Plavix for now until further evaluated by GI.  Plan to follow-up outpatient with Dr. Domenic Polite to discuss further long-term antiplatelet agents.  She was transfused 2 units PRBCs with improvement in hemoglobin to 8.4.  No active GI bleeding during hospitalization with recommendations for outpatient EGD +/- capsule study.  Today: Aspirin and plavix on hold. Plans to pick up iron today.  Occasional dark stools when she was on oral iron, but none recently. After running out of iron, dark stools resolved. Occasional brbpr. States she also brbpr "from the front". No constipation. Some straining with hard balls of stool. This happened on saturday when she notice the scant amount of bright red blood on toilet tissue. Doesn't take anything to help with constipation.   No abdominal pain. No GERD symptoms, dysphagia, nausea or vomiting. Tuesday of last week, she did have the sensation of nausea that lasted 15-20 minutes but hasn't returned.   Feels much better since blood transfusion. Fatigue and weakness has resolved. No prior upper endoscopy. No NSAIDs other than baby aspirin.   Lost 16 lbs with COVID. Appetite is improving. Eating about 3 meals a day. Weight is stable recently.  No early satiety.   Past Medical History:  Diagnosis Date  . 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 Cough 11/13/2019  . Essential hypertension   . Exposure to sexually transmitted disease (STD) 12/11/2019  . Heart attack [facility]) 2004   [place] - hospitalized with stress test by report and presumably medical therapy; subsequent cardiac catheterization (no PCI)    . History of COVID-19 08/2019  . History of syphilis 12/11/2019  . Mixed hyperlipidemia   . PAD (peripheral artery disease) ([place]) 2019   PTCA/stent [place]  . Pneumonia due to COVID-19 virus 09/10/2019  . Sleep apnea    Pt supposed to wear CPAP, but doesn't.  . Type 2 diabetes mellitus ([place])     Past Surgical History:  Procedure Laterality Date  . ABDOMINAL HYSTERECTOMY    . BACK SURGERY    . BARTHOLIN GLAND CYST EXCISION Left 03/04/2015   Procedure: EXCISION OF LEFT BARTHOLINS TUMOR;  Surgeon: [name], MD;  Location: [facility];  Service: Gynecology;  Laterality: Left;  . CERVICAL SPINE SURGERY    . COLONOSCOPY N/A 02/01/2020   Procedure: COLONOSCOPY;  Surgeon: Daneil Dolin, MD; Scattered medium mouth diverticula in the sigmoid and descending colon, otherwise normal exam.  . CYST REMOVAL TRUNK    . Multiple cyst removal surgeries    . Stent of lower extremities      Current Outpatient Medications  Medication Sig Dispense Refill  . albuterol (VENTOLIN HFA) 108 (90 Base) MCG/ACT inhaler Inhale 2 puffs into the lungs every 4 (four) hours as needed for wheezing or shortness of breath.    Marland Kitchen amLODipine (NORVASC) 10 MG tablet Take 1 tablet (10 mg total) by mouth daily. 90 tablet 1  . Multiple Vitamin (MULTIVITAMIN WITH MINERALS) TABS tablet Take 1 tablet by mouth daily.    Marland Kitchen olmesartan-hydrochlorothiazide (BENICAR HCT) 20-12.5 MG tablet Take 1 tablet by mouth daily. 30 tablet 11  . rosuvastatin (CRESTOR) 40 MG tablet Take 1 tablet (40 mg total) by mouth daily. 90 tablet 0  . aspirin EC 81 MG tablet Take 1 tablet (81 mg total) by mouth daily. Hold until cleared by Gastroenterology (Patient not taking: Reported on 03/17/2020) 30 tablet 11  . clopidogrel (PLAVIX) 75 MG tablet Take 1 tablet (75 mg total) by mouth daily. Hold until cleared by Gastroenterology (Patient not taking: Reported on 03/17/2020) 90 tablet 0  . ferrous sulfate 325 (65 FE) MG EC tablet Take 1 tablet (325 mg total) by  mouth 2 (two) times daily. (Patient not taking: Reported on 03/17/2020) 60 tablet 3   No current facility-administered medications for this visit.    Allergies as of 03/17/2020  . (No Known Allergies)    Family History  Problem Relation Age of Onset  . Colon cancer Mother        In her 49s  . Breast cancer Sister   . Stomach cancer Maternal Aunt     Social History   Socioeconomic History  . Marital status: Widowed    Spouse name: Not on file  . Number of children: 1  . Years of education: Not on file  . Highest education level: Some college, no degree  Occupational History  . Not on file  Tobacco Use  . Smoking status: Former Smoker    Packs/day: 2.00    Years: 25.00    Pack years: 50.00    Types: Cigarettes    Quit date: 03/10/1985    Years since quitting: 35.0  .  Smokeless tobacco: Never Used  Vaping Use  . Vaping Use: Never used  Substance and Sexual Activity  . Alcohol use: Yes    Alcohol/week: 0.0 standard drinks    Comment: socially  . Drug use: No  . Sexual activity: Yes    Birth control/protection: Surgical  Other Topics Concern  . Not on file  Social History Narrative   Lives in [place] and [place] 6 months here and there owns an apt in [place] alone, but helps to care for mother who is 73 years (goes between the kids)   Sister is in [place] is in [place]      Son who is 3 years old lives in [place], [place]    Has 6 grand kids and 7 great grands      Enjoys exercise (harder since COVID)-dancing      Diet: eats all foods-enjoys smoothies, avoids fried foods, limited red meat if any   Caffeine: coffee 1 cup in the morning-sometimes will have 1 in evening    Water: 3-4 bottles daily       Wears seat belt   Smoke detectors in home   Does not use phone while driving            Social Determinants of Health   Financial Resource Strain: Low Risk   . Difficulty of Paying Living Expenses: Not very hard  Food Insecurity: No  Food Insecurity  . Worried About Charity fundraiser in the Last Year: Never true  . Ran Out of Food in the Last Year: Never true  Transportation Needs: No Transportation Needs  . Lack of Transportation (Medical): No  . Lack of Transportation (Non-Medical): No  Physical Activity: Inactive  . Days of Exercise per Week: 0 days  . Minutes of Exercise per Session: 0 min  Stress: No Stress Concern Present  . Feeling of Stress : Not at all  Social Connections: Socially Isolated  . Frequency of Communication with Friends and Family: More than three times a week  . Frequency of Social Gatherings with Friends and Family: More than three times a week  . Attends Religious Services: Never  . Active Member of Clubs or Organizations: No  . Attends Archivist Meetings: Never  . Marital Status: Widowed    Review of Systems: Gen: Denies fever, chills, cold or flulike symptoms. CV: Denies chest pain or palpitations.  Resp: Denies dyspnea. Admits to occasional cough which is improving s/p COVID in Jan 2021.  GI: See HPI Heme: See HPI  Physical Exam: BP (!) 139/70   Pulse 84   Temp (!) 96.2 F (35.7 C) (Temporal)   Ht 5' (1.524 m)   Wt 148 lb 12.8 oz (67.5 kg)   BMI 29.06 kg/m  General:   Alert and oriented. No distress noted. Pleasant and cooperative.  Head:  Normocephalic and atraumatic. Eyes:  Conjuctiva clear without scleral icterus. Heart:  S1, S2 present without murmurs appreciated. Lungs:  Clear to auscultation bilaterally. No wheezes, rales, or rhonchi. No distress.  Abdomen:  +BS, soft, non-tender and non-distended. No rebound or guarding. No HSM or masses noted. Msk:  Symmetrical without gross deformities. Normal posture. Extremities:  Without edema. Neurologic:  Alert and  oriented x4 Psych: Normal mood and affect.

## 2020-03-15 NOTE — Plan of Care (Signed)
  Problem: Education: Goal: Knowledge of General Education information will improve Description Including pain rating scale, medication(s)/side effects and non-pharmacologic comfort measures Outcome: Progressing   Problem: Health Behavior/Discharge Planning: Goal: Ability to manage health-related needs will improve Outcome: Progressing   

## 2020-03-15 NOTE — Discharge Summary (Signed)
Physician Discharge Summary  Nicole Horn PZW:[id number] DOB: Mar 03, 1946 DOA: 03/14/2020  PCP: [name], NP  Admit date: 03/14/2020 Discharge date: 03/15/2020  Admitted From: Home Disposition: Home  Recommendations for Outpatient Follow-up:  1. Follow up with PCP in 1-2 weeks 2. Please obtain BMP/CBC in one week 3. Follow-up with gastroenterology on 7/26 as previously scheduled 4. Follow-up with Dr. Glo Herring, gynecology to discuss transient vaginal bleeding 5. Hold antiplatelet agents until cleared by gastroenterology   Discharge Condition: Stable CODE STATUS:full code Diet recommendation: heart healthy  Brief/Interim Summary: HPI: Nicole Horn is a 74 y.o. female with medical history significant of coronary artery disease, peripheral vascular disease, hypertension, iron deficiency anemia, has been feeling increasingly weak, fatigue for the last several weeks.  She denies any chest pain or shortness of breath.  She is seen her primary care physician yesterday and was found to have severe anemia with a hemoglobin of 6.3.  She did report some dark stools in the past few weeks, but nothing recently.  She reports that she stopped taking her oral iron supplementation approximately a month ago.  She has not had any hematuria.  No hematochezia.  ED Course: She was while in the emergency room her hemoglobin is noted to be 6.  Stool occult blood was sent to be negative, but per ER physician it was a poor sample.  Iron study shows severe iron deficiency.  2 units of PRBC were ordered.  She been referred for admission.  Discharge Diagnoses:  Active Problems:   Essential hypertension   Hyperlipemia   Symptomatic anemia   PVD (peripheral vascular disease) (HCC)   CAD (coronary artery disease)   Iron deficiency anemia  1. Symptomatic anemia, iron deficiency anemia.  Patient has severe iron deficiency with associated anemia.  She did not have any evidence of active  bleeding, but may have had some GI bleeding episodes in the last few weeks.  She was just seen by GI during her hospitalization and was not felt that endoscopy was urgently needed.  The patient has an appointment with gastroenterology on 7/26.  She was transfused 2 units of PRBC with improvement in hemoglobin as well as her symptoms.  She did not have any evidence of GI bleeding to her hospitalization she did report occasional blood in her urine.  Urinalysis did not show any signs of infection and CT scan did not show any worrisome lesions in her kidneys or bladder.  This may be some concurrent vaginal bleeding.  She is already followed by Dr. Glo Herring with gynecology and has been recommended to follow-up with him for further work-up as needed. 2. Coronary artery disease/peripheral vascular disease.  She reports having stent placement in her lower extremity approximately 2 years ago.  She is not had any recent interventions.  Discussed with Dr. Harl Bowie, cardiology who felt that since she did not have any recent stents, it would be reasonable to hold aspirin and Plavix for now until she can be evaluated by GI.  Plans will be to follow-up with Dr. Domenic Polite in the outpatient setting to discuss need for further long-term antiplatelet agents.  Of note, she had ABIs performed recently and they was noted to be greater than 1 bilaterally. 3. Hypertension.    Continue Norvasc, olmesartan/hydrochlorothiazide on discharge 4. Hyperlipidemia.  Continue statin  Discharge Instructions  Discharge Instructions    Diet - low sodium heart healthy   Complete by: As directed    Increase activity slowly   Complete by: As directed  Allergies as of 03/15/2020   No Known Allergies     Medication List    TAKE these medications   albuterol 108 (90 Base) MCG/ACT inhaler Commonly known as: VENTOLIN HFA Inhale 2 puffs into the lungs every 4 (four) hours as needed for wheezing or shortness of breath.   amLODipine 10 MG  tablet Commonly known as: NORVASC Take 1 tablet (10 mg total) by mouth daily.   aspirin EC 81 MG tablet Take 1 tablet (81 mg total) by mouth daily. Hold until cleared by Gastroenterology What changed: additional instructions   clopidogrel 75 MG tablet Commonly known as: PLAVIX Take 1 tablet (75 mg total) by mouth daily. Hold until cleared by Gastroenterology What changed: additional instructions   ferrous sulfate 325 (65 FE) MG EC tablet Take 1 tablet (325 mg total) by mouth 2 (two) times daily.   multivitamin with minerals Tabs tablet Take 1 tablet by mouth daily.   olmesartan-hydrochlorothiazide 20-12.5 MG tablet Commonly known as: Benicar HCT Take 1 tablet by mouth daily.   rosuvastatin 40 MG tablet Commonly known as: CRESTOR Take 1 tablet (40 mg total) by mouth daily.       No Known Allergies  Consultations:  Gastroenterology   Procedures/Studies: CT ABDOMEN PELVIS WO CONTRAST  Result Date: 03/15/2020 CLINICAL DATA:  Hematuria EXAM: CT ABDOMEN AND PELVIS WITHOUT CONTRAST TECHNIQUE: Multidetector CT imaging of the abdomen and pelvis was performed following the standard protocol without IV contrast. Oral enteric contrast was administered. COMPARISON:  CT abdomen pelvis, 07/30/2014 FINDINGS: Lower chest: Scarring of the bilateral lung bases Hepatobiliary: No solid liver abnormality is seen. No gallstones, gallbladder wall thickening, or biliary dilatation. Pancreas: Unremarkable. No pancreatic ductal dilatation or surrounding inflammatory changes. Spleen: Normal in size without significant abnormality. Adrenals/Urinary Tract: Adrenal glands are unremarkable. Kidneys are normal, without renal calculi, solid lesion, or hydronephrosis. Bladder is unremarkable. Stomach/Bowel: Stomach is within normal limits. Ileal diverticula. No evidence of bowel wall thickening, distention, or inflammatory changes. Sigmoid diverticula. Vascular/Lymphatic: Aortic atherosclerosis. No enlarged  abdominal or pelvic lymph nodes. Reproductive: Status post hysterectomy. Other: No abdominal wall hernia or abnormality. No abdominopelvic ascites. Musculoskeletal: No acute or significant osseous findings. IMPRESSION: 1. No non-contrast CT findings of the abdomen or pelvis to explain hematuria. No evidence of urinary tract calculus or hydronephrosis. 2. Diverticulosis without evidence of acute diverticulitis. 3. Status post hysterectomy. 4. Aortic Atherosclerosis (ICD10-I70.0). Electronically Signed   By: Eddie Candle M.D.   On: 03/15/2020 15:55   DG Chest 2 View  Result Date: 03/11/2020 CLINICAL DATA:  Cough, shortness of breath EXAM: CHEST - 2 VIEW COMPARISON:  12/12/2019 FINDINGS: Areas of scarring in the lungs bilaterally. No acute airspace opacities or effusions. Heart is normal size. No acute bony abnormality. IMPRESSION: Persistent predominantly linear densities in the lungs bilaterally, likely scarring. No active disease. Electronically Signed   By: Rolm Baptise M.D.   On: 03/11/2020 13:22   ECHOCARDIOGRAM COMPLETE  Result Date: 03/04/2020    ECHOCARDIOGRAM REPORT   Patient Name:   SHAKOYA GILMORE Date of Exam: 03/04/2020 Medical Rec #:  [id number]              Height:       60.0 in Accession #:    [id number]             Weight:       152.0 lb Date of Birth:  1946/01/30              BSA:  1.661 m Patient Age:    74 years               BP:           155/65 mmHg Patient Gender: F                      HR:           77 bpm. Exam Location:  [facility] Procedure: 2D Echo, Cardiac Doppler and Color Doppler Indications:     I25.119 CAD  History:         Patient has no prior history of Echocardiogram examinations.                  CAD, PAD; Risk Factors:Hypertension, Dyslipidemia, Diabetes,                  Former Smoker and Sleep Apnea.  Sonographer:     Jeneen Montgomery RDMS, RVT, RDCS Referring Phys:  [place] Diagnosing Phys: Carlyle Dolly MD IMPRESSIONS  1. Left ventricular  ejection fraction, by estimation, is 60 to 65%. The left ventricle has normal function. The left ventricle has no regional wall motion abnormalities. There is mild left ventricular hypertrophy. Left ventricular diastolic parameters are consistent with Grade I diastolic dysfunction (impaired relaxation).  2. Right ventricular systolic function is normal. The right ventricular size is normal. There is normal pulmonary artery systolic pressure.  3. Right atrial size was mildly dilated.  4. The mitral valve is normal in structure. No evidence of mitral valve regurgitation. No evidence of mitral stenosis.  5. The aortic valve is tricuspid. Aortic valve regurgitation is not visualized. No aortic stenosis is present.  6. The inferior vena cava is normal in size with greater than 50% respiratory variability, suggesting right atrial pressure of 3 mmHg. FINDINGS  Left Ventricle: Left ventricular ejection fraction, by estimation, is 60 to 65%. The left ventricle has normal function. The left ventricle has no regional wall motion abnormalities. The left ventricular internal cavity size was normal in size. There is  mild left ventricular hypertrophy. Left ventricular diastolic parameters are consistent with Grade I diastolic dysfunction (impaired relaxation). Normal left ventricular filling pressure. Right Ventricle: The right ventricular size is normal. No increase in right ventricular wall thickness. Right ventricular systolic function is normal. There is normal pulmonary artery systolic pressure. The tricuspid regurgitant velocity is 1.70 m/s, and  with an assumed right atrial pressure of 10 mmHg, the estimated right ventricular systolic pressure is 46.9 mmHg. Left Atrium: Left atrial size was normal in size. Right Atrium: Right atrial size was mildly dilated. Pericardium: There is no evidence of pericardial effusion. Mitral Valve: The mitral valve is normal in structure. No evidence of mitral valve regurgitation. No evidence  of mitral valve stenosis. Tricuspid Valve: The tricuspid valve is normal in structure. Tricuspid valve regurgitation is trivial. No evidence of tricuspid stenosis. Aortic Valve: The aortic valve is tricuspid. Aortic valve regurgitation is not visualized. No aortic stenosis is present. Aortic valve mean gradient measures 3.9 mmHg. Aortic valve peak gradient measures 8.7 mmHg. Aortic valve area, by VTI measures 2.46 cm. Pulmonic Valve: The pulmonic valve was not well visualized. Pulmonic valve regurgitation is not visualized. No evidence of pulmonic stenosis. Aorta: The aortic root is normal in size and structure. Pulmonary Artery: Indeterminant PASP, inadequate TR jet. Venous: The inferior vena cava is normal in size with greater than 50% respiratory variability, suggesting right atrial pressure of 3 mmHg.  IAS/Shunts: No atrial level shunt detected by color flow Doppler.  LEFT VENTRICLE PLAX 2D LVIDd:         4.33 cm     Diastology LVIDs:         2.50 cm     LV e' lateral:   11.80 cm/s LV PW:         1.09 cm     LV E/e' lateral: 7.0 LV IVS:        0.97 cm     LV e' medial:    6.48 cm/s LVOT diam:     1.80 cm     LV E/e' medial:  12.8 LV SV:         69 LV SV Index:   42 LVOT Area:     2.54 cm  LV Volumes (MOD) LV vol d, MOD A2C: 55.4 ml LV vol d, MOD A4C: 50.0 ml LV vol s, MOD A2C: 21.3 ml LV vol s, MOD A4C: 13.5 ml LV SV MOD A2C:     34.1 ml LV SV MOD A4C:     50.0 ml LV SV MOD BP:      39.0 ml RIGHT VENTRICLE RV S prime:     11.60 cm/s TAPSE (M-mode): 2.6 cm LEFT ATRIUM             Index LA diam:        3.00 cm 1.81 cm/m LA Vol (A2C):   34.1 ml 20.53 ml/m LA Vol (A4C):   40.9 ml 24.62 ml/m LA Biplane Vol:         23.60 ml/m  AORTIC VALVE AV Area (Vmax):    2.16 cm AV Area (Vmean):   2.16 cm AV Area (VTI):     2.46 cm AV Vmax:           147.20 cm/s AV Vmean:          91.718 cm/s AV VTI:            0.281 m AV Peak Grad:      8.7 mmHg AV Mean Grad:      3.9 mmHg LVOT Vmax:         125.00 cm/s LVOT Vmean:         77.900 cm/s LVOT VTI:          0.272 m LVOT/AV VTI ratio: 0.97  AORTA Ao Root diam: 2.90 cm MITRAL VALVE                TRICUSPID VALVE MV Area (PHT):              TR Peak grad:   11.6 mmHg MV Decel Time: 185 msec     TR Vmax:        170.00 cm/s MR Peak grad: 6.8 mmHg MR Vmax:      130.00 cm/s   SHUNTS MV E velocity: 82.90 cm/s   Systemic VTI:  0.27 m MV A velocity: 122.00 cm/s  Systemic Diam: 1.80 cm MV E/A ratio:  0.68 Carlyle Dolly MD Electronically signed by Carlyle Dolly MD Signature Date/Time: 03/04/2020/4:18:26 PM    Final    VAS [place] LOWER EXT ART SEG MULTI (SEGMENTALS & LE RAYNAUDS)  Result Date: 03/05/2020 LOWER EXTREMITY DOPPLER STUDY Indications: Peripheral artery disease. High Risk Factors: Hypertension, hyperlipidemia, Diabetes, past history of                    smoking, coronary artery disease. Other Factors: Patient complains of pain in  her legs right > left but denies                true claudication symptoms. She states she had stents put in both                legs in [place] years ago but no records were found.  Performing Technologist: McFatter, Leafy Ro RVT  Examination Guidelines: A complete evaluation includes at minimum, Doppler waveform signals and systolic blood pressure reading at the level of bilateral brachial, anterior tibial, and posterior tibial arteries, when vessel segments are accessible. Bilateral testing is considered an integral part of a complete examination. Photoelectric Plethysmograph (PPG) waveforms and toe systolic pressure readings are included as required and additional duplex testing as needed. Limited examinations for reoccurring indications may be performed as noted.  ABI Findings: +---------+------------------+-----+---------+--------+ Right    Rt Pressure (mmHg)IndexWaveform Comment  +---------+------------------+-----+---------+--------+ Brachial 115                    triphasic         +---------+------------------+-----+---------+--------+ CFA                              triphasic         +---------+------------------+-----+---------+--------+ Popliteal                       triphasic         +---------+------------------+-----+---------+--------+ ATA      115               1.00 triphasic         +---------+------------------+-----+---------+--------+ PTA      131               1.14 triphasic         +---------+------------------+-----+---------+--------+ PERO     128               1.11 triphasic         +---------+------------------+-----+---------+--------+ Great Toe98                0.85 Normal            +---------+------------------+-----+---------+--------+ +---------+------------------+-----+---------+-------+ Left     Lt Pressure (mmHg)IndexWaveform Comment +---------+------------------+-----+---------+-------+ Brachial 112                    triphasic        +---------+------------------+-----+---------+-------+ CFA                             triphasic        +---------+------------------+-----+---------+-------+ Popliteal                       triphasic        +---------+------------------+-----+---------+-------+ ATA      124               1.08 triphasic        +---------+------------------+-----+---------+-------+ PTA      135               1.17 triphasic        +---------+------------------+-----+---------+-------+ PERO     125               1.09 triphasic        +---------+------------------+-----+---------+-------+ Alinda Money  0.87 Normal           +---------+------------------+-----+---------+-------+  See Arterial Duplex study  Summary: Right: Resting right ankle-brachial index is within normal range. No evidence of significant right lower extremity arterial disease. The right toe-brachial index is normal. Left: Resting left ankle-brachial index is within normal range. No evidence of significant left lower extremity arterial disease. The left  toe-brachial index is normal.  *See table(s) above for measurements and observations.  Electronically signed by Carlyle Dolly MD on 03/05/2020 at 12:44:34 PM.    Final    VAS [place] LOWER EXTREMITY ARTERIAL DUPLEX  Result Date: 03/05/2020 LOWER EXTREMITY ARTERIAL DUPLEX STUDY Indications: Peripheral artery disease, and Bilateral stent placement in legs. High Risk Factors: Hypertension, hyperlipidemia, Diabetes, past history of                    smoking, coronary artery disease. Other Factors: Patient complains of pain in her legs right > left but denies                true claudication symptoms. She states she had stents put in both                legs in [place] years ago but no records were found.  Current ABI: Right 1.14, Left 1.17 Performing Technologist: Jeneen Montgomery RDMS, RVT, RDCS  Examination Guidelines: A complete evaluation includes B-mode imaging, spectral Doppler, color Doppler, and power Doppler as needed of all accessible portions of each vessel. Bilateral testing is considered an integral part of a complete examination. Limited examinations for reoccurring indications may be performed as noted.  +-----------+--------+-----+---------------+---------+--------+ RIGHT      PSV cm/sRatioStenosis       Waveform Comments +-----------+--------+-----+---------------+---------+--------+ CFA Distal 149                         triphasic         +-----------+--------+-----+---------------+---------+--------+ DFA        138                                           +-----------+--------+-----+---------------+---------+--------+ SFA Prox   127                         triphasic         +-----------+--------+-----+---------------+---------+--------+ SFA Mid    167          30-49% stenosistriphasic         +-----------+--------+-----+---------------+---------+--------+ SFA Distal 133                         triphasic          +-----------+--------+-----+---------------+---------+--------+ POP Prox   134                         triphasic         +-----------+--------+-----+---------------+---------+--------+ POP Distal 111                         triphasic         +-----------+--------+-----+---------------+---------+--------+ TP Trunk   111                         triphasic         +-----------+--------+-----+---------------+---------+--------+  ATA Distal 162          30-49% stenosistriphasic         +-----------+--------+-----+---------------+---------+--------+ PTA Distal 137                         triphasic         +-----------+--------+-----+---------------+---------+--------+ PERO Distal63                          triphasic         +-----------+--------+-----+---------------+---------+--------+  +-----------+--------+-----+---------------+---------+--------+ LEFT       PSV cm/sRatioStenosis       Waveform Comments +-----------+--------+-----+---------------+---------+--------+ CFA Distal 148                         triphasic         +-----------+--------+-----+---------------+---------+--------+ DFA        144                                           +-----------+--------+-----+---------------+---------+--------+ SFA Prox   157          30-49% stenosistriphasic         +-----------+--------+-----+---------------+---------+--------+ SFA Mid    176          30-49% stenosistriphasic         +-----------+--------+-----+---------------+---------+--------+ SFA Distal 146                         triphasic         +-----------+--------+-----+---------------+---------+--------+ POP Prox   117                         triphasic         +-----------+--------+-----+---------------+---------+--------+ POP Distal 110                         triphasic         +-----------+--------+-----+---------------+---------+--------+ TP Trunk   108                          triphasic         +-----------+--------+-----+---------------+---------+--------+ ATA Distal 127                         triphasic         +-----------+--------+-----+---------------+---------+--------+ PTA Distal 103                         triphasic         +-----------+--------+-----+---------------+---------+--------+ PERO Distal70                          triphasic         +-----------+--------+-----+---------------+---------+--------+  Summary: Right: 30-49% stenosis noted in the superficial femoral artery. 30-49% stenosis noted in the anterior tibial artery. Stent struts were not visualized. Left: 30-49% stenosis noted in the superficial femoral artery. Stents struts were not visualized. See Arterial Doppler study.  See table(s) above for measurements and observations. Electronically signed by Carlyle Dolly MD on 03/05/2020 at 1:00:38 PM.    Final        Subjective:   Discharge Exam: Vitals:  03/14/20 2130 03/15/20 0130 03/15/20 0530 03/15/20 1457  BP: (!) 145/64 (!) 115/42 (!) 128/61 (!) 143/54  Pulse: 84 80 71 71  Resp: 20 20 20 20   Temp: 98.9 F (37.2 C) 99 F (37.2 C) 98.2 F (36.8 C) 98.4 F (36.9 C)  TempSrc: Oral Oral Axillary Oral  SpO2: 99%  100% 100%  Weight: 67 kg     Height: 5' (1.524 m)       General: Pt is alert, awake, not in acute distress Cardiovascular: RRR, S1/S2 +, no rubs, no gallops Respiratory: CTA bilaterally, no wheezing, no rhonchi Abdominal: Soft, NT, ND, bowel sounds + Extremities: no edema, no cyanosis    The results of significant diagnostics from this hospitalization (including imaging, microbiology, ancillary and laboratory) are listed below for reference.     Microbiology: Recent Results (from the past 240 hour(s))  SARS Coronavirus 2 by RT PCR (hospital order, performed in [facility] hospital lab) Nasopharyngeal Nasopharyngeal Swab     Status: None   Collection Time: 03/14/20 11:25 AM    Specimen: Nasopharyngeal Swab  Result Value Ref Range Status   SARS Coronavirus 2 NEGATIVE NEGATIVE Final    Comment: (NOTE) SARS-CoV-2 target nucleic acids are NOT DETECTED.  The SARS-CoV-2 RNA is generally detectable in upper and lower respiratory specimens during the acute phase of infection. The lowest concentration of SARS-CoV-2 viral copies this assay can detect is 250 copies / mL. A negative result does not preclude SARS-CoV-2 infection and should not be used as the sole basis for treatment or other patient management decisions.  A negative result may occur with improper specimen collection / handling, submission of specimen other than nasopharyngeal swab, presence of viral mutation(s) within the areas targeted by this assay, and inadequate number of viral copies (<250 copies / mL). A negative result must be combined with clinical observations, patient history, and epidemiological information.  Fact Sheet for Patients:   StrictlyIdeas.no  Fact Sheet for Healthcare Providers: BankingDealers.co.za  This test is not yet approved or  cleared by the [place] FDA and has been authorized for detection and/or diagnosis of SARS-CoV-2 by FDA under an Emergency Use Authorization (EUA).  This EUA will remain in effect (meaning this test can be used) for the duration of the COVID-19 declaration under Section 564(b)(1) of the Act, 21 U.S.C. section 360bbb-3(b)(1), unless the authorization is terminated or revoked sooner.  Performed at [facility], [address], [place], [place] [address]      Labs: BNP (last 3 results) Recent Labs    10/05/19 2119  BNP 70.3   Basic Metabolic Panel: Recent Labs  Lab 03/13/20 1516 03/14/20 0940 03/15/20 0611  NA 143 142 144  K 4.1 3.7 3.7  CL 106 108 111  CO2 25 25 26   GLUCOSE 78 107* 98  BUN 17 17 17   CREATININE 1.37* 1.21* 1.16*  CALCIUM 9.6 8.9 8.8*   Liver Function  Tests: Recent Labs  Lab 03/13/20 1516 03/14/20 0940  AST 21 25  ALT 16 18  ALKPHOS 72 57  BILITOT 0.2 0.4  PROT 7.0 7.1  ALBUMIN 4.3 3.9   No results for input(s): LIPASE, AMYLASE in the last 168 hours. No results for input(s): AMMONIA in the last 168 hours. CBC: Recent Labs  Lab 03/13/20 1516 03/14/20 0940 03/15/20 0611  WBC 7.6 5.0 7.2  NEUTROABS  --  2.5  --   HGB 6.3* 6.0* 8.4*  HCT 24.3* 22.5* 29.8*  MCV 66* 64.7* 71.6*  PLT  400 347 304   Cardiac Enzymes: No results for input(s): CKTOTAL, CKMB, CKMBINDEX, TROPONINI in the last 168 hours. BNP: Invalid input(s): POCBNP CBG: No results for input(s): GLUCAP in the last 168 hours. D-Dimer No results for input(s): DDIMER in the last 72 hours. Hgb A1c No results for input(s): HGBA1C in the last 72 hours. Lipid Profile No results for input(s): CHOL, HDL, LDLCALC, TRIG, CHOLHDL, LDLDIRECT in the last 72 hours. Thyroid function studies No results for input(s): TSH, T4TOTAL, T3FREE, THYROIDAB in the last 72 hours.  Invalid input(s): FREET3 Anemia work up Recent Labs    03/14/20 0940  VITAMINB12 420  FOLATE 40.0  FERRITIN 3*  TIBC 509*  IRON 16*  RETICCTPCT 1.4   Urinalysis    Component Value Date/Time   COLORURINE STRAW (A) 03/15/2020 1447   APPEARANCEUR CLEAR 03/15/2020 1447   LABSPEC 1.004 (L) 03/15/2020 1447   PHURINE 8.0 03/15/2020 1447   GLUCOSEU NEGATIVE 03/15/2020 1447   HGBUR SMALL (A) 03/15/2020 1447   BILIRUBINUR NEGATIVE 03/15/2020 1447   KETONESUR NEGATIVE 03/15/2020 1447   PROTEINUR NEGATIVE 03/15/2020 1447   UROBILINOGEN 0.2 02/28/2015 1011   NITRITE NEGATIVE 03/15/2020 1447   LEUKOCYTESUR NEGATIVE 03/15/2020 1447   Sepsis Labs Invalid input(s): PROCALCITONIN,  WBC,  LACTICIDVEN Microbiology Recent Results (from the past 240 hour(s))  SARS Coronavirus 2 by RT PCR (hospital order, performed in [place] hospital lab) Nasopharyngeal Nasopharyngeal Swab     Status: None   Collection  Time: 03/14/20 11:25 AM   Specimen: Nasopharyngeal Swab  Result Value Ref Range Status   SARS Coronavirus 2 NEGATIVE NEGATIVE Final    Comment: (NOTE) SARS-CoV-2 target nucleic acids are NOT DETECTED.  The SARS-CoV-2 RNA is generally detectable in upper and lower respiratory specimens during the acute phase of infection. The lowest concentration of SARS-CoV-2 viral copies this assay can detect is 250 copies / mL. A negative result does not preclude SARS-CoV-2 infection and should not be used as the sole basis for treatment or other patient management decisions.  A negative result may occur with improper specimen collection / handling, submission of specimen other than nasopharyngeal swab, presence of viral mutation(s) within the areas targeted by this assay, and inadequate number of viral copies (<250 copies / mL). A negative result must be combined with clinical observations, patient history, and epidemiological information.  Fact Sheet for Patients:   StrictlyIdeas.no  Fact Sheet for Healthcare Providers: BankingDealers.co.za  This test is not yet approved or  cleared by the [place] FDA and has been authorized for detection and/or diagnosis of SARS-CoV-2 by FDA under an Emergency Use Authorization (EUA).  This EUA will remain in effect (meaning this test can be used) for the duration of the COVID-19 declaration under Section 564(b)(1) of the Act, 21 U.S.C. section 360bbb-3(b)(1), unless the authorization is terminated or revoked sooner.  Performed at [facility], [address], [place], [place] [address]      Time coordinating discharge: 48mins  SIGNED:   Kathie Dike, MD  [facility] 03/15/2020, 7:30 PM   If 7PM-7AM, please contact night-coverage [email or web address]

## 2020-03-15 NOTE — Progress Notes (Signed)
Nsg Discharge Note  Admit Date:  03/14/2020 Discharge date: 03/15/2020   Nicole Horn to be D/C'd Home per MD order.  AVS completed.  Copy for chart, and copy for patient signed, and dated. Removed IV-clean, dry, intact. Reviewed d/c paperwork with patient. Answered all questions. Walked stable patient to main entrance where she was picked up by her aunt.  Patient/caregiver able to verbalize understanding.  Discharge Medication: Allergies as of 03/15/2020   No Known Allergies     Medication List    TAKE these medications   albuterol 108 (90 Base) MCG/ACT inhaler Commonly known as: VENTOLIN HFA Inhale 2 puffs into the lungs every 4 (four) hours as needed for wheezing or shortness of breath.   amLODipine 10 MG tablet Commonly known as: NORVASC Take 1 tablet (10 mg total) by mouth daily.   aspirin EC 81 MG tablet Take 1 tablet (81 mg total) by mouth daily. Hold until cleared by Gastroenterology What changed: additional instructions   clopidogrel 75 MG tablet Commonly known as: PLAVIX Take 1 tablet (75 mg total) by mouth daily. Hold until cleared by Gastroenterology What changed: additional instructions   ferrous sulfate 325 (65 FE) MG EC tablet Take 1 tablet (325 mg total) by mouth 2 (two) times daily.   multivitamin with minerals Tabs tablet Take 1 tablet by mouth daily.   olmesartan-hydrochlorothiazide 20-12.5 MG tablet Commonly known as: Benicar HCT Take 1 tablet by mouth daily.   rosuvastatin 40 MG tablet Commonly known as: CRESTOR Take 1 tablet (40 mg total) by mouth daily.       Discharge Assessment: Vitals:   03/15/20 0530 03/15/20 1457  BP: (!) 128/61 (!) 143/54  Pulse: 71 71  Resp: 20 20  Temp: 98.2 F (36.8 C) 98.4 F (36.9 C)  SpO2: 100% 100%   Skin clean, dry and intact without evidence of skin break down, no evidence of skin tears noted. IV catheter discontinued intact. Site without signs and symptoms of complications - no redness or edema  noted at insertion site, patient denies c/o pain - only slight tenderness at site.  Dressing with slight pressure applied.  D/c Instructions-Education: Discharge instructions given to patient/family with verbalized understanding. D/c education completed with patient/family including follow up instructions, medication list, d/c activities limitations if indicated, with other d/c instructions as indicated by MD - patient able to verbalize understanding, all questions fully answered. Patient instructed to return to ED, call 911, or call MD for any changes in condition.  Patient escorted via Nicole Horn, and D/C home via private auto.  Santa Lighter, RN 03/15/2020 6:32 PM

## 2020-03-17 ENCOUNTER — Ambulatory Visit (INDEPENDENT_AMBULATORY_CARE_PROVIDER_SITE_OTHER): Payer: Medicare Other | Admitting: Gastroenterology

## 2020-03-17 ENCOUNTER — Other Ambulatory Visit: Payer: Self-pay

## 2020-03-17 ENCOUNTER — Encounter: Payer: Self-pay | Admitting: Gastroenterology

## 2020-03-17 ENCOUNTER — Telehealth: Payer: Self-pay | Admitting: *Deleted

## 2020-03-17 ENCOUNTER — Encounter: Payer: Self-pay | Admitting: *Deleted

## 2020-03-17 VITALS — BP 139/70 | HR 84 | Temp 96.2°F | Ht 60.0 in | Wt 148.8 lb

## 2020-03-17 DIAGNOSIS — K5900 Constipation, unspecified: Secondary | ICD-10-CM

## 2020-03-17 DIAGNOSIS — D509 Iron deficiency anemia, unspecified: Secondary | ICD-10-CM

## 2020-03-17 NOTE — Patient Instructions (Addendum)
Will be scheduled for an upper endoscopy with possible placement of Givens capsule in the very near future with Dr. Gala Romney.   Please hold off on starting oral iron for now as we are working to get your procedure scheduled as soon as possible.  Please have repeat labs completed in 2 weeks to recheck your hemoglobin.  After procedure, please start oral iron twice daily. Be sure to look at the back of the bottle and ensure each tablet contains 325 mg of ferrous sulfate.  For constipation: Add Benefiber or Metamucil daily. Add MiraLAX one capful (17 g) daily in 8 ounces of water. If you develop diarrhea, you may decrease the frequency of MiraLAX. Be sure to drink enough water to keep your urine pale yellow to clear.  Continue to monitor for bright red blood per rectum or black stools and let us know if this occurs.  We will follow up with you after your procedures. Do not hesitate to call with questions or concerns prior.  Nicole Altes, PA-C [facility]

## 2020-03-17 NOTE — Assessment & Plan Note (Addendum)
74 year old female presenting today for further evaluation of iron deficiency anemia.  Recently underwent colonoscopy 02/01/2020 revealing scattered medium mouth diverticula in the sigmoid and descending colon, otherwise normal exam.  Patient had worsening weakness and fatigue and presented to the emergency room 03/14/2020 found to have hemoglobin of 6 (down from 9.2 in April 2021), iron 16, saturation ratio 3%, ferritin 3.  Stool occult blood negative but per ER physician, it was a poor sample.  She received 2 units PRBCs with posttransfusion hemoglobin 8.4.  Currently, she is feeling much improved.  Admits to occasional bright red blood per rectum in the setting of constipation with hard stools and straining (addressed below).  Also with possible vaginal bleeding.  Historically had reported black stools in the setting of iron but none recently she has been off iron.  Per cardiology, aspirin and Plavix are on hold until GI evaluation is complete.  No regular NSAID use other than baby aspirin.  No significant upper GI symptoms.  Weight loss associated with COVID-19 in January 2021, but weight has been fairly stable more recently.  We are going to need to evaluate her upper GI tract and possibly her small bowel to evaluate for etiology of IDA.  She could also have IDA secondary to GYN etiology.  Plan: Proceed with EGD +/- Givens capsule with Dr. Gala Romney ASAP. Due to scheduling and need for EGD ASAP, will complete procedure with propofol. ASA II Further recommendations regarding resuming aspirin and Plavix per Dr. Gala Romney after procedures. Hold off on starting oral iron until EGD/possible Givens is complete.  Plan to start iron twice daily thereafter. Update CBC in 2 weeks. Continue to avoid NSAIDs. Follow-up after procedures.

## 2020-03-17 NOTE — Assessment & Plan Note (Signed)
Typically moves her bowels daily but she is passing small hard balls of stools at times with associated scant toilet tissue hematochezia. TCS up to date in June 2021 with scattered medium mouth diverticula in the sigmoid and descending colon, otherwise normal exam.    Plan:  Add Benefiber or Metamucil daily. Add MiraLAX 1 capful (17 g) daily in 8 ounces of water.  May decrease frequency if she develops frequent diarrhea. Drinking of water to keep your purulent clear. Follow-up after EGD +/- Givens capsule for IDA as discussed above.

## 2020-03-17 NOTE — Telephone Encounter (Signed)
PA submitted via [facility] website for EGD/GIVENS Capsule Study. PA pending Tracking #: [id number]

## 2020-03-18 ENCOUNTER — Encounter (HOSPITAL_COMMUNITY)
Admission: RE | Admit: 2020-03-18 | Discharge: 2020-03-18 | Disposition: A | Discharge status: Home / Self Care | Payer: Medicare Other | Source: Ambulatory Visit | Attending: Internal Medicine | Admitting: Internal Medicine

## 2020-03-18 ENCOUNTER — Encounter (HOSPITAL_COMMUNITY): Payer: Self-pay

## 2020-03-18 ENCOUNTER — Other Ambulatory Visit: Payer: Self-pay

## 2020-03-19 NOTE — Telephone Encounter (Signed)
PA for EGD/GIVENS approved. Authorization: [id number]  Dates 03/27/2020-06/25/2020

## 2020-03-26 ENCOUNTER — Other Ambulatory Visit: Payer: Self-pay

## 2020-03-26 ENCOUNTER — Other Ambulatory Visit (HOSPITAL_COMMUNITY)
Admission: RE | Admit: 2020-03-26 | Discharge: 2020-03-26 | Disposition: A | Discharge status: Home / Self Care | Payer: Medicare Other | Source: Ambulatory Visit | Attending: Internal Medicine | Admitting: Internal Medicine

## 2020-03-26 ENCOUNTER — Encounter (HOSPITAL_COMMUNITY)
Admission: RE | Admit: 2020-03-26 | Discharge: 2020-03-26 | Disposition: A | Discharge status: Home / Self Care | Payer: Medicare Other | Source: Ambulatory Visit | Attending: Internal Medicine | Admitting: Internal Medicine

## 2020-03-26 DIAGNOSIS — Z20822 Contact with and (suspected) exposure to covid-19: Secondary | ICD-10-CM | POA: Insufficient documentation

## 2020-03-26 DIAGNOSIS — Z01812 Encounter for preprocedural laboratory examination: Secondary | ICD-10-CM | POA: Insufficient documentation

## 2020-03-26 LAB — CBC WITH DIFFERENTIAL/PLATELET
Abs Immature Granulocytes: 0.01 10*3/uL (ref 0.00–0.07)
Basophils Absolute: 0.1 10*3/uL (ref 0.0–0.1)
Basophils Relative: 1 %
Eosinophils Absolute: 0.2 10*3/uL (ref 0.0–0.5)
Eosinophils Relative: 4 %
HCT: 34.1 % — ABNORMAL LOW (ref 36.0–46.0)
Hemoglobin: 9.7 g/dL — ABNORMAL LOW (ref 12.0–15.0)
Immature Granulocytes: 0 %
Lymphocytes Relative: 27 %
Lymphs Abs: 1.5 10*3/uL (ref 0.7–4.0)
MCH: 20.2 pg — ABNORMAL LOW (ref 26.0–34.0)
MCHC: 28.4 g/dL — ABNORMAL LOW (ref 30.0–36.0)
MCV: 70.9 fL — ABNORMAL LOW (ref 80.0–100.0)
Monocytes Absolute: 0.6 10*3/uL (ref 0.1–1.0)
Monocytes Relative: 10 %
Neutro Abs: 3.1 10*3/uL (ref 1.7–7.7)
Neutrophils Relative %: 58 %
Platelets: 326 10*3/uL (ref 150–400)
RBC: 4.81 MIL/uL (ref 3.87–5.11)
RDW: 25 % — ABNORMAL HIGH (ref 11.5–15.5)
WBC: 5.5 10*3/uL (ref 4.0–10.5)
nRBC: 0 % (ref 0.0–0.2)

## 2020-03-26 LAB — SARS CORONAVIRUS 2 (TAT 6-24 HRS): SARS Coronavirus 2: NEGATIVE

## 2020-03-27 ENCOUNTER — Ambulatory Visit (HOSPITAL_COMMUNITY)
Admission: RE | Admit: 2020-03-27 | Discharge: 2020-03-27 | Disposition: A | Discharge status: Home / Self Care | Payer: Medicare Other | Attending: Internal Medicine | Admitting: Internal Medicine

## 2020-03-27 ENCOUNTER — Other Ambulatory Visit: Payer: Self-pay

## 2020-03-27 ENCOUNTER — Ambulatory Visit (HOSPITAL_COMMUNITY): Payer: Medicare Other | Admitting: Anesthesiology

## 2020-03-27 ENCOUNTER — Encounter (HOSPITAL_COMMUNITY)
Admission: RE | Disposition: A | Discharge status: Home / Self Care | Payer: Self-pay | Source: Home / Self Care | Attending: Internal Medicine

## 2020-03-27 ENCOUNTER — Encounter (HOSPITAL_COMMUNITY): Payer: Self-pay | Admitting: Internal Medicine

## 2020-03-27 DIAGNOSIS — E782 Mixed hyperlipidemia: Secondary | ICD-10-CM | POA: Diagnosis not present

## 2020-03-27 DIAGNOSIS — Z955 Presence of coronary angioplasty implant and graft: Secondary | ICD-10-CM | POA: Diagnosis not present

## 2020-03-27 DIAGNOSIS — G4730 Sleep apnea, unspecified: Secondary | ICD-10-CM | POA: Diagnosis not present

## 2020-03-27 DIAGNOSIS — D500 Iron deficiency anemia secondary to blood loss (chronic): Secondary | ICD-10-CM | POA: Insufficient documentation

## 2020-03-27 DIAGNOSIS — I2510 Atherosclerotic heart disease of native coronary artery without angina pectoris: Secondary | ICD-10-CM | POA: Insufficient documentation

## 2020-03-27 DIAGNOSIS — Z8616 Personal history of COVID-19: Secondary | ICD-10-CM | POA: Insufficient documentation

## 2020-03-27 DIAGNOSIS — Z79899 Other long term (current) drug therapy: Secondary | ICD-10-CM | POA: Diagnosis not present

## 2020-03-27 DIAGNOSIS — R195 Other fecal abnormalities: Secondary | ICD-10-CM

## 2020-03-27 DIAGNOSIS — E1151 Type 2 diabetes mellitus with diabetic peripheral angiopathy without gangrene: Secondary | ICD-10-CM | POA: Diagnosis not present

## 2020-03-27 DIAGNOSIS — Z87891 Personal history of nicotine dependence: Secondary | ICD-10-CM | POA: Diagnosis not present

## 2020-03-27 DIAGNOSIS — I10 Essential (primary) hypertension: Secondary | ICD-10-CM | POA: Diagnosis not present

## 2020-03-27 DIAGNOSIS — I252 Old myocardial infarction: Secondary | ICD-10-CM | POA: Diagnosis not present

## 2020-03-27 DIAGNOSIS — K269 Duodenal ulcer, unspecified as acute or chronic, without hemorrhage or perforation: Secondary | ICD-10-CM | POA: Insufficient documentation

## 2020-03-27 DIAGNOSIS — E119 Type 2 diabetes mellitus without complications: Secondary | ICD-10-CM | POA: Diagnosis not present

## 2020-03-27 HISTORY — PX: ESOPHAGOGASTRODUODENOSCOPY (EGD) WITH PROPOFOL: SHX5813

## 2020-03-27 HISTORY — PX: GIVENS CAPSULE STUDY: SHX5432

## 2020-03-27 LAB — GLUCOSE, CAPILLARY
Glucose-Capillary: 101 mg/dL — ABNORMAL HIGH (ref 70–99)
Glucose-Capillary: 92 mg/dL (ref 70–99)

## 2020-03-27 SURGERY — ESOPHAGOGASTRODUODENOSCOPY (EGD) WITH PROPOFOL
Anesthesia: General

## 2020-03-27 MED ORDER — STERILE WATER FOR IRRIGATION IR SOLN
Status: DC | PRN
  Administered 2020-03-27: 1.5 mL

## 2020-03-27 MED ORDER — LIDOCAINE VISCOUS HCL 2 % MT SOLN
OROMUCOSAL | Status: AC
  Filled 2020-03-27: qty 15

## 2020-03-27 MED ORDER — LIDOCAINE HCL (CARDIAC) PF 50 MG/5ML IV SOSY
PREFILLED_SYRINGE | INTRAVENOUS | Status: DC | PRN
Start: 2020-03-27 — End: 2020-03-27
  Administered 2020-03-27: 100 mg via INTRAVENOUS

## 2020-03-27 MED ORDER — GLYCOPYRROLATE 0.2 MG/ML IJ SOLN: 0.2000 mg | Freq: Once | INTRAMUSCULAR | Status: DC

## 2020-03-27 MED ORDER — LIDOCAINE VISCOUS HCL 2 % MT SOLN
15.0000 mL | Freq: Once | OROMUCOSAL | Status: AC
  Administered 2020-03-27: 15 mL via OROMUCOSAL

## 2020-03-27 MED ORDER — GLYCOPYRROLATE PF 0.2 MG/ML IJ SOSY
PREFILLED_SYRINGE | INTRAMUSCULAR | Status: AC
  Administered 2020-03-27: 0.2 mg
  Filled 2020-03-27: qty 1

## 2020-03-27 MED ORDER — PROPOFOL 500 MG/50ML IV EMUL
INTRAVENOUS | Status: DC | PRN
  Administered 2020-03-27: 100 mg via INTRAVENOUS
  Administered 2020-03-27: 175 ug/kg/min via INTRAVENOUS

## 2020-03-27 MED ORDER — LACTATED RINGERS IV SOLN: INTRAVENOUS | Status: DC | PRN

## 2020-03-27 MED ORDER — CHLORHEXIDINE GLUCONATE CLOTH 2 % EX PADS: 6.0000 | MEDICATED_PAD | Freq: Once | CUTANEOUS | Status: DC

## 2020-03-27 MED ORDER — LACTATED RINGERS IV SOLN: Freq: Once | INTRAVENOUS | Status: AC

## 2020-03-27 NOTE — Anesthesia Postprocedure Evaluation (Signed)
Anesthesia Post Note  Patient: Nicole Horn  Procedure(s) Performed: ESOPHAGOGASTRODUODENOSCOPY (EGD) WITH PROPOFOL (N/A ) GIVENS CAPSULE STUDY (N/A )  Patient location during evaluation: Endoscopy Anesthesia Type: General Level of consciousness: awake, oriented, awake and alert and patient cooperative Pain management: pain level controlled Vital Signs Assessment: post-procedure vital signs reviewed and stable Respiratory status: spontaneous breathing, respiratory function stable, nonlabored ventilation and patient connected to nasal cannula oxygen Cardiovascular status: blood pressure returned to baseline and stable Postop Assessment: no backache and no headache Anesthetic complications: no   No complications documented.   Last Vitals:  Vitals:   03/27/20 1112  BP: 133/62  Pulse: 74  Resp: (!) 21  Temp: 37.1 C  SpO2: 100%    Last Pain:  Vitals:   03/27/20 1159  TempSrc:   PainSc: 0-No pain                 Tacy Learn

## 2020-03-27 NOTE — Anesthesia Preprocedure Evaluation (Addendum)
Anesthesia Evaluation  Patient identified by MRN, date of birth, ID band Patient awake    Reviewed: Allergy & Precautions, NPO status , Patient's Chart, lab work & pertinent test results  History of Anesthesia Complications Negative for: history of anesthetic complications  Airway Mallampati: II  TM Distance: >3 FB Neck ROM: Full    Dental  (+) Partial Lower, Dental Advisory Given   Pulmonary sleep apnea , pneumonia (covid - 08/2019), resolved, former smoker,    Pulmonary exam normal breath sounds clear to auscultation       Cardiovascular METS: 3 - Mets hypertension, Pt. on medications + CAD, + Past MI, + Peripheral Vascular Disease and + DOE  Normal cardiovascular exam Rhythm:Regular Rate:Normal  1. Left ventricular ejection fraction, by estimation, is 60 to 65%. The  left ventricle has normal function. The left ventricle has no regional  wall motion abnormalities. There is mild left ventricular hypertrophy.  Left ventricular diastolic parameters  are consistent with Grade I diastolic dysfunction (impaired relaxation).  2. Right ventricular systolic function is normal. The right ventricular  size is normal. There is normal pulmonary artery systolic pressure.  3. Right atrial size was mildly dilated.  4. The mitral valve is normal in structure. No evidence of mitral valve  regurgitation. No evidence of mitral stenosis.  5. The aortic valve is tricuspid. Aortic valve regurgitation is not  visualized. No aortic stenosis is present.  6. The inferior vena cava is normal in size with greater than 50%  respiratory variability, suggesting right atrial pressure of 3 mmHg.    Neuro/Psych negative psych ROS   GI/Hepatic Neg liver ROS, GERD  Controlled,  Endo/Other  diabetes, Well Controlled, Type 2, Oral Hypoglycemic Agents  Renal/GU negative Renal ROS  negative genitourinary   Musculoskeletal negative musculoskeletal  ROS (+)   Abdominal   Peds  Hematology  (+) anemia ,   Anesthesia Other Findings   Reproductive/Obstetrics negative OB ROS                            Anesthesia Physical Anesthesia Plan  ASA: III  Anesthesia Plan: General   Post-op Pain Management:    Induction: Intravenous  PONV Risk Score and Plan: TIVA  Airway Management Planned: Nasal Cannula, Natural Airway and Simple Face Mask  Additional Equipment:   Intra-op Plan:   Post-operative Plan:   Informed Consent: I have reviewed the patients History and Physical, chart, labs and discussed the procedure including the risks, benefits and alternatives for the proposed anesthesia with the patient or authorized representative who has indicated his/her understanding and acceptance.     Dental advisory given  Plan Discussed with: CRNA and Surgeon  Anesthesia Plan Comments:        Anesthesia Quick Evaluation

## 2020-03-27 NOTE — H&P (Signed)
@LOGO @   Primary Care Physician:  [name], NP Primary Gastroenterologist:  Dr. Gala Romney  Pre-Procedure History & Physical: HPI:  Nicole Horn is a 74 y.o. female here for further evaluation of iron deficiency anemia /GI bleed via EGD and possible capsule study the small intestine. She continues on aspirin and Plavix. Negative colonoscopy recently.   Past Medical History:  Diagnosis Date  . 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 Cough 11/13/2019  . Essential hypertension   . Exposure to sexually transmitted disease (STD) 12/11/2019  . Heart attack [facility]) 2004   [place] - hospitalized with stress test by report and presumably medical therapy; subsequent cardiac catheterization (no PCI)  . History of COVID-19 08/2019  . History of syphilis 12/11/2019  . Mixed hyperlipidemia   . PAD (peripheral artery disease) ([place]) 2019   PTCA/stent [place]  . Pneumonia due to COVID-19 virus 09/10/2019  . Sleep apnea    Pt supposed to wear CPAP, but doesn't.  . Type 2 diabetes mellitus ([place])     Past Surgical History:  Procedure Laterality Date  . ABDOMINAL HYSTERECTOMY    . BACK SURGERY    . BARTHOLIN GLAND CYST EXCISION Left 03/04/2015   Procedure: EXCISION OF LEFT BARTHOLINS TUMOR;  Surgeon: [name], MD;  Location: [facility];  Service: Gynecology;  Laterality: Left;  . CERVICAL SPINE SURGERY    . COLONOSCOPY N/A 02/01/2020   Procedure: COLONOSCOPY;  Surgeon: Daneil Dolin, MD; Scattered medium mouth diverticula in the sigmoid and descending colon, otherwise normal exam.  . CYST REMOVAL TRUNK    . Multiple cyst removal surgeries    . Stent of lower extremities      Prior to Admission medications   Medication Sig Start Date End Date Taking? Authorizing Provider  amLODipine (NORVASC) 10 MG tablet Take 1 tablet (10 mg total) by mouth daily. 01/29/20  Yes [name], NP  Multiple Vitamin (MULTIVITAMIN WITH MINERALS) TABS tablet Take 1 tablet by mouth daily.    Yes [provider]  olmesartan-hydrochlorothiazide (BENICAR HCT) 20-12.5 MG tablet Take 1 tablet by mouth daily. 03/10/20  Yes Tanda Rockers, MD  rosuvastatin (CRESTOR) 40 MG tablet Take 1 tablet (40 mg total) by mouth daily. 02/29/20  Yes [name], NP  albuterol (VENTOLIN HFA) 108 (90 Base) MCG/ACT inhaler Inhale 2 puffs into the lungs every 4 (four) hours as needed for wheezing or shortness of breath.    [provider]  aspirin EC 81 MG tablet Take 1 tablet (81 mg total) by mouth daily. Hold until cleared by Gastroenterology Patient not taking: Reported on 03/17/2020 03/15/20   Kathie Dike, MD  clopidogrel (PLAVIX) 75 MG tablet Take 1 tablet (75 mg total) by mouth daily. Hold until cleared by Gastroenterology Patient not taking: Reported on 03/17/2020 03/15/20   Kathie Dike, MD  ferrous sulfate 325 (65 FE) MG EC tablet Take 1 tablet (325 mg total) by mouth 2 (two) times daily. Patient not taking: Reported on 03/17/2020 03/15/20 03/15/21  Kathie Dike, MD    Allergies as of 03/17/2020  . (No Known Allergies)    Family History  Problem Relation Age of Onset  . Colon cancer Mother        In her 12s  . Breast cancer Sister   . Stomach cancer Maternal Aunt     Social History   Socioeconomic History  . Marital status: Widowed    Spouse name: Not on file  . Number of  children: 1  . Years of education: Not on file  . Highest education level: Some college, no degree  Occupational History  . Not on file  Tobacco Use  . Smoking status: Former Smoker    Packs/day: 2.00    Years: 25.00    Pack years: 50.00    Types: Cigarettes    Quit date: 03/10/1985    Years since quitting: 35.0  . Smokeless tobacco: Never Used  Vaping Use  . Vaping Use: Never used  Substance and Sexual Activity  . Alcohol use: Yes    Alcohol/week: 0.0 standard drinks    Comment: socially  . Drug use: No  . Sexual activity: Yes    Birth control/protection: Surgical  Other Topics  Concern  . Not on file  Social History Narrative   Lives in [place] and [place] 6 months here and there owns an apt in [place] alone, but helps to care for mother who is 56 years (goes between the kids)   Sister is in [place] is in [place]      Son who is 20 years old lives in [place], [place]    Has 6 grand kids and 7 great grands      Enjoys exercise (harder since COVID)-dancing      Diet: eats all foods-enjoys smoothies, avoids fried foods, limited red meat if any   Caffeine: coffee 1 cup in the morning-sometimes will have 1 in evening    Water: 3-4 bottles daily       Wears seat belt   Smoke detectors in home   Does not use phone while driving            Social Determinants of Health   Financial Resource Strain: Low Risk   . Difficulty of Paying Living Expenses: Not very hard  Food Insecurity: No Food Insecurity  . Worried About Charity fundraiser in the Last Year: Never true  . Ran Out of Food in the Last Year: Never true  Transportation Needs: No Transportation Needs  . Lack of Transportation (Medical): No  . Lack of Transportation (Non-Medical): No  Physical Activity: Inactive  . Days of Exercise per Week: 0 days  . Minutes of Exercise per Session: 0 min  Stress: No Stress Concern Present  . Feeling of Stress : Not at all  Social Connections: Socially Isolated  . Frequency of Communication with Friends and Family: More than three times a week  . Frequency of Social Gatherings with Friends and Family: More than three times a week  . Attends Religious Services: Never  . Active Member of Clubs or Organizations: No  . Attends Archivist Meetings: Never  . Marital Status: Widowed  Intimate Partner Violence: Not At Risk  . Fear of Current or Ex-Partner: No  . Emotionally Abused: No  . Physically Abused: No  . Sexually Abused: No    Review of Systems: See HPI, otherwise negative ROS  Physical Exam: BP 133/62   Pulse 74   Temp  98.8 F (37.1 C) (Oral)   Resp (!) 21   SpO2 100%  General:   Alert,  Well-developed, well-nourished, pleasant and cooperative in NAD Skin:  Intact without significant lesions or rashes. Neck:  Supple; no masses or thyromegaly. No significant cervical adenopathy. Lungs:  Clear throughout to auscultation.   No wheezes, crackles, or rhonchi. No acute distress. Heart:  Regular rate and rhythm; no murmurs, clicks, rubs,  or gallops. Abdomen:  Non-distended, normal bowel sounds.  Soft and nontender without appreciable mass or hepatosplenomegaly.  Pulses:  Normal pulses noted. Extremities:  Without clubbing or edema.  Impression/Plan: 74 year old lady with iron deficiency anemia, GI bleed negative colonoscopy earlier this year.  Here for diagnostic EGD with possible capsule deployment to further evaluate per plan.  Denies dysphagia.  The risks, benefits, limitations, alternatives and imponderables have been reviewed with the patient. Potential for esophageal dilation, biopsy, etc. have also been reviewed.  Questions have been answered. All parties agreeable.     Notice: This dictation was prepared with Dragon dictation along with smaller phrase technology. Any transcriptional errors that result from this process are unintentional and may not be corrected upon review.

## 2020-03-27 NOTE — Op Note (Signed)
[facility] Patient Name: Nicole Horn Procedure Date: 03/27/2020 11:15 AM MRN: [id number] Date of Birth: 01/18/1946 Attending MD: Norvel Richards , MD CSN: [id number] Age: 74 Admit Type: Outpatient Procedure:                Upper GI endoscopy Indications:              Iron deficiency anemia secondary to chronic blood                            loss, Heme positive stool Providers:                Norvel Richards, MD, Otis Peak B. Sharon Seller, RN,                            Aram Candela Referring MD:              Medicines:                Propofol per Anesthesia Complications:            No immediate complications. Estimated Blood Loss:     Estimated blood loss: none. Procedure:                Pre-Anesthesia Assessment:                           - Prior to the procedure, a History and Physical                            was performed, and patient medications and                            allergies were reviewed. The patient's tolerance of                            previous anesthesia was also reviewed. The risks                            and benefits of the procedure and the sedation                            options and risks were discussed with the patient.                            All questions were answered, and informed consent                            was obtained. Prior Anticoagulants: The patient has                            taken no previous anticoagulant or antiplatelet                            agents. ASA Grade Assessment: II - A patient with  mild systemic disease. After reviewing the risks                            and benefits, the patient was deemed in                            satisfactory condition to undergo the procedure.                           After obtaining informed consent, the endoscope was                            passed under direct vision. Throughout the                            procedure, the  patient's blood pressure, pulse, and                            oxygen saturations were monitored continuously. The                            [id number]-[id number] ([id number]) scope was introduced through the                            mouth, and advanced to the second part of duodenum.                            The upper GI endoscopy was accomplished without                            difficulty. The patient tolerated the procedure                            well. Scope In: 12:05:15 PM Scope Out: 12:10:47 PM Total Procedure Duration: 0 hours 5 minutes 32 seconds  Findings:      The examined esophagus was normal.      The entire examined stomach was normal. (2) 2 mm erosions in the second       portion of the duodenum otherwise D1 and D2 appeared normal. Scope       withdrawn. Small bowel video capsule deployment device attached. Scope       reintroduced in the stomach video capsule launched into the duodenal       bulb without difficulty or complication. Impression:               - Normal esophagus.                           - Normal stomach. 2 small duodenal erosions. Status                            post deployment of the small bowel capsule into the                            duodenum.                           -  No specimens collected. Moderate Sedation:      Moderate (conscious) sedation was personally administered by an       anesthesia professional. The following parameters were monitored: oxygen       saturation, heart rate, blood pressure, respiratory rate, EKG, adequacy       of pulmonary ventilation, and response to care. Recommendation:           - Patient has a contact number available for                            emergencies. The signs and symptoms of potential                            delayed complications were discussed with the                            patient. Return to normal activities tomorrow.                            Written discharge instructions were provided to  the                            patient.                           -Follow capsule protocol. Further recommendations                            to follow pending review of capsule data. Procedure Code(s):        --- Professional ---                           [phone], Esophagogastroduodenoscopy, flexible,                            transoral; diagnostic, including collection of                            specimen(s) by brushing or washing, when performed                            (separate procedure) Diagnosis Code(s):        --- Professional ---                           D50.0, Iron deficiency anemia secondary to blood                            loss (chronic)                           R19.5, Other fecal abnormalities CPT copyright 2019 American Medical Association. All rights reserved. The codes documented in this report are preliminary and upon coder review may  be revised to meet current compliance requirements. Cristopher Estimable. Gabi Mcfate, MD Norvel Richards, MD 03/27/2020 12:20:11 PM This report has been signed electronically. Number of Addenda: 0

## 2020-03-27 NOTE — Transfer of Care (Signed)
Immediate Anesthesia Transfer of Care Note  Patient: Nicole Horn  Procedure(s) Performed: ESOPHAGOGASTRODUODENOSCOPY (EGD) WITH PROPOFOL (N/A ) GIVENS CAPSULE STUDY (N/A )  Patient Location: Endoscopy Unit  Anesthesia Type:General  Level of Consciousness: awake, alert , oriented and patient cooperative  Airway & Oxygen Therapy: Patient Spontanous Breathing and Patient connected to nasal cannula oxygen  Post-op Assessment: Report given to RN, Post -op Vital signs reviewed and stable and Post -op Vital signs reviewed and unstable, Anesthesiologist notified  Post vital signs: Reviewed and stable  Last Vitals:  Vitals Value Taken Time  BP    Temp    Pulse    Resp    SpO2      Last Pain:  Vitals:   03/27/20 1159  TempSrc:   PainSc: 0-No pain         Complications: No complications documented.

## 2020-03-27 NOTE — Discharge Instructions (Signed)
EGD Discharge instructions Please read the instructions outlined below and refer to this sheet in the next few weeks. These discharge instructions provide you with general information on caring for yourself after you leave the hospital. Your doctor may also give you specific instructions. While your treatment has been planned according to the most current medical practices available, unavoidable complications occasionally occur. If you have any problems or questions after discharge, please call your doctor. ACTIVITY  You may resume your regular activity but move at a slower pace for the next 24 hours.   Take frequent rest periods for the next 24 hours.   Walking will help expel (get rid of) the air and reduce the bloated feeling in your abdomen.   No driving for 24 hours (because of the anesthesia (medicine) used during the test).   You may shower.   Do not sign any important legal documents or operate any machinery for 24 hours (because of the anesthesia used during the test).  NUTRITION  Drink plenty of fluids.   You may resume your normal diet.   Begin with a light meal and progress to your normal diet.   Avoid alcoholic beverages for 24 hours or as instructed by your caregiver.  MEDICATIONS  You may resume your normal medications unless your caregiver tells you otherwise.  WHAT YOU CAN EXPECT TODAY  You may experience abdominal discomfort such as a feeling of fullness or "gas" pains.  FOLLOW-UP  Your doctor will discuss the results of your test with you.  SEEK IMMEDIATE MEDICAL ATTENTION IF ANY OF THE FOLLOWING OCCUR:  Excessive nausea (feeling sick to your stomach) and/or vomiting.   Severe abdominal pain and distention (swelling).   Trouble swallowing.   Temperature over 101 F (37.8 C).   Rectal bleeding or vomiting of blood.    Your esophagus and stomach look good today.  There was a slight amount of irritation in your small intestine.  The capsule was placed  to take pictures of your small intestine  Follow the instructions of the nurses regarding diet afterwards today  Further recommendations to follow once the small bowel capsule study results have been reviewed  At patient request, I called [facility] at [phone] -call rolled to voicemail "not set up yet"      Monitored Anesthesia Care, Care After These instructions provide you with information about caring for yourself after your procedure. Your health care provider may also give you more specific instructions. Your treatment has been planned according to current medical practices, but problems sometimes occur. Call your health care provider if you have any problems or questions after your procedure. What can I expect after the procedure? After your procedure, you may:  Feel sleepy for several hours.  Feel clumsy and have poor balance for several hours.  Feel forgetful about what happened after the procedure.  Have poor judgment for several hours.  Feel nauseous or vomit.  Have a sore throat if you had a breathing tube during the procedure. Follow these instructions at home: For at least 24 hours after the procedure:      Have a responsible adult stay with you. It is important to have someone help care for you until you are awake and alert.  Rest as needed.  Do not: ? Participate in activities in which you could fall or become injured. ? Drive. ? Use heavy machinery. ? Drink alcohol. ? Take sleeping pills or medicines that cause drowsiness. ? Make important decisions or sign legal  documents. ? Take care of children on your own. Eating and drinking  Follow the diet that is recommended by your health care provider.  If you vomit, drink water, juice, or soup when you can drink without vomiting.  Make sure you have little or no nausea before eating solid foods. General instructions  Take over-the-counter and prescription medicines only as told by your  health care provider.  If you have sleep apnea, surgery and certain medicines can increase your risk for breathing problems. Follow instructions from your health care provider about wearing your sleep device: ? Anytime you are sleeping, including during daytime naps. ? While taking prescription pain medicines, sleeping medicines, or medicines that make you drowsy.  If you smoke, do not smoke without supervision.  Keep all follow-up visits as told by your health care provider. This is important. Contact a health care provider if:  You keep feeling nauseous or you keep vomiting.  You feel light-headed.  You develop a rash.  You have a fever. Get help right away if:  You have trouble breathing. Summary  For several hours after your procedure, you may feel sleepy and have poor judgment.  Have a responsible adult stay with you for at least 24 hours or until you are awake and alert. This information is not intended to replace advice given to you by your health care provider. Make sure you discuss any questions you have with your health care provider. Document Revised: 11/07/2017 Document Reviewed: 11/30/2015 Elsevier Patient Education  [place].

## 2020-03-31 ENCOUNTER — Encounter (HOSPITAL_COMMUNITY): Payer: Self-pay | Admitting: Internal Medicine

## 2020-04-02 ENCOUNTER — Telehealth: Payer: Self-pay | Admitting: Nurse Practitioner

## 2020-04-02 DIAGNOSIS — D509 Iron deficiency anemia, unspecified: Secondary | ICD-10-CM

## 2020-04-02 NOTE — Op Note (Signed)
Small Bowel Givens Capsule Study Procedure date:  03/27/20  Referring Provider:  Aliene Altes, PA PCP:  Dr. Jerelene Redden, Barbee Cough, NP  Indication for procedure:  Acute on chronic iron deficiency anemia, GI bleed  Patient data:  Wt: 5' Ht: 67.5 kg Waist: N/A  Findings: Complete study.  Image quality good.  No gastric masses time as the capsule was deployed directly into the duodenum after completion of EGD.  The [place] identified at 03:47:14.  There were multiple identified small bowel angiectasia's as likely source of iron deficiency anemia and acute bleed as well.  A couple images of mild bleeding there is no identified source of the bleeding.  Toward the end of the study, a few images were captured with blood flecks in the distal small bowel likely from intermittent bleeding.  First Gastric image:  N/A (deployed by EGD scope) First Duodenal image: 00:01:44 First Ileo-Cecal Valve image: N/A First Cecal image: 07:09:45 Gastric Passage time: N/A Small Bowel Passage time:  07h 48m  Summary & Recommendations: We will need to discuss with cardiology discontinuing aspirin and/or Plavix, based on cardiac risk factors versus risk of recurrent acute bleeding.  She would definitely benefit from referral to hematology/oncology for ongoing care and management of iron deficiency anemia.  She will likely continue to have iron deficiency anemia with the multiple pan small bowel angiectasia is identified today.  The study today was reviewed and discussed with Dr. Gala Romney   Thank you for allowing [place] to participate in the care of [place], [place], AGNP-C Adult & Gerontological Nurse Practitioner [facility]

## 2020-04-02 NOTE — Telephone Encounter (Signed)
Please tell the patient I reviewed her small bowel capsule study.  There are multiple areas ("angiectasia's") very likely the reason why she has iron deficiency anemia with a slow bleed as well as the reason why she had a sudden blood loss.  Because of the number of these that are throughout her small intestines, there is no endoscopic procedure or surgery that she would likely be a candidate for to try to remove or fix these.  Generally, they are not particularly dangerous other than the risk for bleeding.  She should avoid all NSAIDs (ibuprofen, Motrin, Advil, Aleve, naproxen, Naprosyn, aspirin powders).  We will send a message to her cardiologist and they will likely need to stop either her Plavix and/or her aspirin to help prevent recurrent bleeding.  I will also send a referral for her to start seeing a hematologist who can manage her anemia and give her iron or blood if needed/as needed.  Call if she has any questions.  Please set-up referral to Heme/Onc for IDA s/p acute GI bleed related to small bowel source.

## 2020-04-03 NOTE — Telephone Encounter (Signed)
Lmom waiting on a return call.  

## 2020-04-04 NOTE — Telephone Encounter (Signed)
Spoke with pt. Pt is aware that the small bowel capsule study was reviewed and results. Pt was advised to avoid NDAIDS.   Please send referral for a Hematology eval for IDA, acute GI bleed

## 2020-04-04 NOTE — Addendum Note (Signed)
Addended by: Cheron Every on: 04/04/2020 09:29 AM   Modules accepted: Orders

## 2020-04-04 NOTE — Telephone Encounter (Signed)
Referral sent 

## 2020-04-07 ENCOUNTER — Telehealth: Payer: Self-pay

## 2020-04-07 NOTE — Telephone Encounter (Signed)
I spoke with patient.She has stopped Plavix for now.

## 2020-04-07 NOTE — Telephone Encounter (Signed)
-----  Message from Satira Sark, MD sent at 04/05/2020  2:05 PM EDT ----- Regarding: RE: GI bleed on Plavix/ASA Thank you for letting me know about her recent GI evaluation Eric.  It seems to me that we should stop Plavix given the likelihood of recurrent GI bleed in the setting of small bowel angiectasias.  She has not undergone a recent vascular intervention and if we can keep her on aspirin alone that should be fine for now.  I will make sure that our nurse contacts her to let her know that we have discussed this issue and plan to stop Plavix for now. ----- Message ----- From: Waynetta Pean Sent: 04/02/2020   5:53 PM EDT To: Satira Sark, MD, Carlis Stable, NP Subject: RE: GI bleed on Plavix/ASA                     Hi Eric,   Dr. Domenic Polite is out this week but should be back on Monday and I will leave for review upon his return. The patient just established care with our office in 01/2020 and by review of notes she has not required any recent cardiac interventions and her last PAD procedure was over 2 years ago. I suspect that Plavix can be discontinued with her remaining on ASA 81 mg daily but given I have not met the patient, I will leave that final decision to Dr. Domenic Polite.  Thanks, [place]  ----- Message ----- From: Carlis Stable, NP Sent: 04/02/2020   4:44 PM EDT To: Satira Sark, MD Subject: GI bleed on Plavix/ASA                         Hey Dr. Domenic Polite,  We recently did an EGD and colonoscopy on this patient for acute on chronic IDA. Her Plavix/ASA has been on hold during her GI workup. I just finished reading her small bowel capsule study (see Op Note 03/27/20 in the "notes" tab).  She has multiple small bowel angiectasias that are at risk for recurrent and/or acute bleeding. There's not really a treatment to get rid of them. I think a risk/benefits analysis should be done to stop her ASA and/or Plavix, based on her cardiac needs vs. bleeding risk.  Will  also be referring to hematology for anemia management.  ThanksRandall Hiss, NP [facility]

## 2020-04-16 ENCOUNTER — Ambulatory Visit (INDEPENDENT_AMBULATORY_CARE_PROVIDER_SITE_OTHER): Payer: Medicare Other | Admitting: Family Medicine

## 2020-04-16 ENCOUNTER — Other Ambulatory Visit: Payer: Self-pay

## 2020-04-16 ENCOUNTER — Encounter: Payer: Self-pay | Admitting: Family Medicine

## 2020-04-16 VITALS — BP 142/71 | HR 66 | Temp 97.4°F | Resp 18 | Ht 60.0 in | Wt 151.4 lb

## 2020-04-16 DIAGNOSIS — E663 Overweight: Secondary | ICD-10-CM | POA: Diagnosis not present

## 2020-04-16 DIAGNOSIS — Z0001 Encounter for general adult medical examination with abnormal findings: Secondary | ICD-10-CM

## 2020-04-16 DIAGNOSIS — I10 Essential (primary) hypertension: Secondary | ICD-10-CM

## 2020-04-16 DIAGNOSIS — Z6829 Body mass index (BMI) 29.0-29.9, adult: Secondary | ICD-10-CM

## 2020-04-16 DIAGNOSIS — Z780 Asymptomatic menopausal state: Secondary | ICD-10-CM

## 2020-04-16 DIAGNOSIS — Z23 Encounter for immunization: Secondary | ICD-10-CM | POA: Insufficient documentation

## 2020-04-16 DIAGNOSIS — D509 Iron deficiency anemia, unspecified: Secondary | ICD-10-CM

## 2020-04-16 DIAGNOSIS — Z1231 Encounter for screening mammogram for malignant neoplasm of breast: Secondary | ICD-10-CM | POA: Insufficient documentation

## 2020-04-16 DIAGNOSIS — D229 Melanocytic nevi, unspecified: Secondary | ICD-10-CM

## 2020-04-16 NOTE — Assessment & Plan Note (Signed)
Improved  Nicole Horn is educated about the importance of exercise daily to help with weight management. A minumum of 30 minutes daily is recommended. Additionally, importance of healthy food choices  with portion control discussed.   Wt Readings from Last 3 Encounters:  04/16/20 151 lb 6.4 oz (68.7 kg)  03/18/20 148 lb 12.8 oz (67.5 kg)  03/17/20 148 lb 12.8 oz (67.5 kg)

## 2020-04-16 NOTE — Patient Instructions (Addendum)
I appreciate the opportunity to provide you with care for your health and wellness. Today we discussed: overall health   Follow up: 3 months  Labs- today at [place] today to dermatologist Orders: Bone Scan and [place]!!!!  Great to see you doing better!  Continue to be active like you have been.  Please continue to practice social distancing to keep you, your family, and our community safe.  If you must go out, please wear a mask and practice good handwashing.  It was a pleasure to see you and I look forward to continuing to work together on your health and well-being. Please do not hesitate to call the office if you need care or have questions about your care.  Have a wonderful day and week. With Gratitude, Cherly Beach, DNP, AGNP-BC  HEALTH MAINTENANCE RECOMMENDATIONS:  It is recommended that you get at least 30 minutes of aerobic exercise at least 5 days/week (for weight loss, you may need as much as 60-90 minutes). This can be any activity that gets your heart rate up. This can be divided in 10-15 minute intervals if needed, but try and build up your endurance at least once a week.  Weight bearing exercise is also recommended twice weekly.  Eat a healthy diet with lots of vegetables, fruits and fiber.  "Colorful" foods have a lot of vitamins (ie green vegetables, tomatoes, red peppers, etc).  Limit sweet tea, regular sodas and alcoholic beverages, all of which has a lot of calories and sugar.  Up to 1 alcoholic drink daily may be beneficial for women (unless trying to lose weight, watch sugars).  Drink a lot of water.  Calcium recommendations are 1200-1500 mg daily (1500 mg for postmenopausal women or women without ovaries), and vitamin D 1000 IU daily.  This should be obtained from diet and/or supplements (vitamins), and calcium should not be taken all at once, but in divided doses.  Monthly self breast exams and yearly mammograms for women  over the age of 29 is recommended.  Sunscreen of at least SPF 30 should be used on all sun-exposed parts of the skin when outside between the hours of 10 am and 4 pm (not just when at beach or pool, but even with exercise, golf, tennis, and yard work!)  Use a sunscreen that says "broad spectrum" so it covers both UVA and UVB rays, and make sure to reapply every 1-2 hours.  Remember to change the batteries in your smoke detectors when changing your clock times in the spring and fall.  Use your seat belt every time you are in a car, and please drive safely and not be distracted with cell phones and texting while driving.

## 2020-04-16 NOTE — Assessment & Plan Note (Signed)
Very very very small mole noted to the left cheek.  Wanted a referral to dermatology.  Referral to dermatology placed.  However I am unsure if she really needs this but she might want it removed per her.

## 2020-04-16 NOTE — Progress Notes (Signed)
Health Maintenance reviewed -   Immunization History  Administered Date(s) Administered   Influenza,inj,Quad PF,6+ Mos 04/16/2020   Influenza-Unspecified 06/19/2019   Moderna SARS-COVID-2 Vaccination 11/02/2019, 12/05/2019   Last Pap smear: n/a Last mammogram: ordered today Last colonoscopy: 01/2020 Last DEXA: ordered today Dentist:  Yes, in [place] Ophtho: Glasses, is going to go to [facility] Exercise: just started back  Smoker: no  Alcohol Use: no  Other doctors caring for patient include:  Patient Care Team: [name], NP as PCP - General (Family Medicine) Satira Sark, MD as PCP - Cardiology (Cardiology) Gala Romney Cristopher Estimable, MD as Consulting Physician (Gastroenterology)  End of Life Discussion:  Patient has a living will and medical power of attorney, but looking to change it.  Subjective:   HPI  Nicole Horn is a 74 y.o. female who presents for annual wellness visit and follow-up on chronic medical conditions.    She reports that she sleeps so-so.  Takes water to go to sleep at times.  She reports that if she moves around the house cleans and, wears her seatbelt she will sleep better.  She has not seen a dentist down here [place].  She usually goes to [place] once or twice a year mostly in there.  She denies having any active bleeding in urine or stool.  Is currently waiting for an appointment with her hematologist/oncologist secondary to having low hemoglobin recently.  She reports that her memory is okay but she does have "senior moments".  She denies having any falls or injury.  She reports that she has a mole on the left side of her face that she would like to get checked out by dermatologist.  She denies have any hearing changes.  She reports that her glasses are about 73 years old.  And she needs [facility] but she has not had an appointment yet.  She reports she still eating ice that she is probably still having some  anemic issues.  Is willing to have me recheck her blood to make sure that the trend is still going in the right direction.  She reports that she tries to eat a lot of vegetables or fruits.  Has not really been on eating meats  She is due for her mammogram, DEXA scan.  She is also willing to have her flu vaccine today.  Review Of Systems  Review of Systems  HENT: Negative.   Eyes: Negative.   Respiratory: Negative.  Negative for cough and shortness of breath.   Cardiovascular: Positive for chest pain. Negative for leg swelling.       Chest pain- only happens when she is upset. Does not have any today   Genitourinary: Negative.   Musculoskeletal: Negative.   Skin: Negative.   Neurological: Positive for headaches. Negative for dizziness.       Headaches recently   Psychiatric/Behavioral: Positive for sleep disturbance.    Objective:   PHYSICAL EXAM:  BP (!) 142/71 (BP Location: Right Arm, Patient Position: Sitting, Cuff Size: Normal)    Pulse 66    Temp (!) 97.4 F (36.3 C) (Temporal)    Resp 18    Ht 5' (1.524 m)    Wt 151 lb 6.4 oz (68.7 kg)    SpO2 99%    BMI 29.57 kg/m   Physical Exam Vitals and nursing note reviewed.  Constitutional:      Appearance: Normal appearance. She is well-developed, well-groomed and  overweight.  HENT:     Head: Normocephalic.     Right Ear: Hearing, tympanic membrane, ear canal and external ear normal.     Left Ear: Hearing, tympanic membrane, ear canal and external ear normal.     Nose: Nose normal.     Mouth/Throat:     Lips: Pink.     Mouth: Mucous membranes are moist.     Pharynx: Oropharynx is clear. Uvula midline.  Eyes:     General: Lids are normal.     Extraocular Movements: Extraocular movements intact.     Conjunctiva/sclera: Conjunctivae normal.     Pupils: Pupils are equal, round, and reactive to light.  Neck:     Thyroid: No thyroid mass, thyromegaly or thyroid tenderness.  Cardiovascular:     Rate and Rhythm: Normal rate and  regular rhythm.     Pulses: Normal pulses.          Radial pulses are 2+ on the right side and 2+ on the left side.       Dorsalis pedis pulses are 2+ on the right side and 2+ on the left side.     Heart sounds: Normal heart sounds.  Pulmonary:     Effort: Pulmonary effort is normal.     Breath sounds: Normal breath sounds.  Abdominal:     General: Abdomen is flat. Bowel sounds are normal.     Palpations: Abdomen is soft.     Tenderness: There is no abdominal tenderness. There is no right CVA tenderness or left CVA tenderness.     Hernia: No hernia is present.  Musculoskeletal:        General: Normal range of motion.     Cervical back: Full passive range of motion without pain, normal range of motion and neck supple.     Right lower leg: No edema.     Left lower leg: No edema.     Comments: MAE, ROM intact   Lymphadenopathy:     Cervical: No cervical adenopathy.  Skin:    General: Skin is warm and dry.     Capillary Refill: Capillary refill takes less than 2 seconds.     Comments: Very small in head size small on left cheek  Neurological:     General: No focal deficit present.     Mental Status: She is alert and oriented to person, place, and time. Mental status is at baseline.     Cranial Nerves: Cranial nerves are intact.     Sensory: Sensation is intact.     Motor: Motor function is intact.     Coordination: Coordination is intact.     Gait: Gait is intact.     Deep Tendon Reflexes: Reflexes are normal and symmetric.  Psychiatric:        Attention and Perception: Attention and perception normal.        Mood and Affect: Mood and affect normal.        Speech: Speech normal.        Behavior: Behavior normal. Behavior is cooperative.        Thought Content: Thought content normal.        Cognition and Memory: Cognition and memory normal.        Judgment: Judgment normal.     Depression Screening  Depression screen [facility] 2/9 04/16/2020 03/13/2020 10/16/2019  Decreased  Interest 0 0 0  Down, Depressed, Hopeless 1 0 0  PHQ - 2 Score 1 0 0  Altered sleeping 3 - -  Tired, decreased energy 1 - -  Change in appetite 0 - -  Feeling bad or failure about yourself  0 - -  Trouble concentrating 0 - -  Moving slowly or fidgety/restless 0 - -  Suicidal thoughts 0 - -  PHQ-9 Score 5 - -  Difficult doing work/chores Not difficult at all - -     Falls  Fall Risk  04/16/2020 03/13/2020 10/16/2019  Falls in the past year? 0 0 1  Number falls in past yr: 0 0 0  Injury with Fall? 0 0 1  Risk for fall due to : No Fall Risks No Fall Risks -  Follow up Falls evaluation completed Falls evaluation completed -    Assessment & Plan:   1. Annual visit for general adult medical examination with abnormal findings   2. Iron deficiency anemia, unspecified iron deficiency anemia type   3. Overweight with body mass index (BMI) of 29 to 29.9 in adult   4. Essential hypertension   5. Postmenopausal   6. Screening mammogram, encounter for   7. Atypical mole   8. Need for immunization against influenza     Tests ordered Orders Placed This Encounter  Procedures   DG Bone Density   MM DIAG BREAST TOMO BILATERAL   Flu Vaccine QUAD 36+ mos IM   CBC   Comprehensive metabolic panel   Ambulatory referral to Dermatology     Plan: Please see assessment and plan per problem list above.   No orders of the defined types were placed in this encounter.  I have personally reviewed: The patient's medical and social history Their use of alcohol, tobacco or illicit drugs Their current medications and supplements The patient's functional ability including ADLs,fall risks, home safety risks, cognitive, and hearing and visual impairment Diet and physical activities Evidence for depression or mood disorders  The patient's weight, height, BMI, and visual acuity have been recorded in the chart.  I have made referrals, counseling, and provided education to the patient based on  review of the above and I have provided the patient with a written personalized care plan for preventive services.    Note: This dictation was prepared with Dragon dictation along with smaller phrase technology. Similar sounding words can be transcribed inadequately or may not be corrected upon review. Any transcriptional errors that result from this process are unintentional.      [name], NP   04/16/2020

## 2020-04-16 NOTE — Assessment & Plan Note (Signed)
Mammogram ordered, bone scan ordered

## 2020-04-16 NOTE — Assessment & Plan Note (Signed)

## 2020-04-16 NOTE — Assessment & Plan Note (Addendum)
She is encouraged to avoid NSAIDs still.  Will be getting updated labs to see how she is doing.  Will be following up with hematology here soon.  Currently reports she was advised not to take iron secondary to follow-up with hematology.

## 2020-04-16 NOTE — Assessment & Plan Note (Signed)
Extensively reviewed history in detail.  In addition reviewed care management/preventative management needs.  Ordered mammogram and bone scan.  Recent colonoscopy performed.  Flu shot provided today.  Encouraged to make sure that she wears her seatbelt.  Has good batteries and smoke detectors.  Gets 30 to 60 minutes of exercise at least 5 days a week.  Follows a low-sodium heart healthy diet.

## 2020-04-16 NOTE — Assessment & Plan Note (Signed)
Not on ACE due to cough.  Is on Benicar will have follow-up with Dr. Leonides Schanz to see if there is anything else needed.  Blood pressure is on the high end of control.  She is encouraged to maintain a DASH diet.  And continue physical exercise.

## 2020-04-17 ENCOUNTER — Other Ambulatory Visit (HOSPITAL_COMMUNITY): Payer: Self-pay | Admitting: Family Medicine

## 2020-04-17 ENCOUNTER — Telehealth: Payer: Self-pay

## 2020-04-17 DIAGNOSIS — Z1231 Encounter for screening mammogram for malignant neoplasm of breast: Secondary | ICD-10-CM

## 2020-04-17 LAB — COMPREHENSIVE METABOLIC PANEL
ALT: 22 IU/L (ref 0–32)
AST: 25 IU/L (ref 0–40)
Albumin/Globulin Ratio: 1.5 (ref 1.2–2.2)
Albumin: 4.3 g/dL (ref 3.7–4.7)
Alkaline Phosphatase: 85 IU/L (ref 48–121)
BUN/Creatinine Ratio: 13 (ref 12–28)
BUN: 14 mg/dL (ref 8–27)
Bilirubin Total: 0.3 mg/dL (ref 0.0–1.2)
CO2: 25 mmol/L (ref 20–29)
Calcium: 9.9 mg/dL (ref 8.7–10.3)
Chloride: 105 mmol/L (ref 96–106)
Creatinine, Ser: 1.05 mg/dL — ABNORMAL HIGH (ref 0.57–1.00)
GFR calc Af Amer: 61 mL/min/{1.73_m2} (ref >59)
GFR calc non Af Amer: 53 mL/min/{1.73_m2} — ABNORMAL LOW (ref >59)
Globulin, Total: 2.9 g/dL (ref 1.5–4.5)
Glucose: 94 mg/dL (ref 65–99)
Potassium: 4.1 mmol/L (ref 3.5–5.2)
Sodium: 143 mmol/L (ref 134–144)
Total Protein: 7.2 g/dL (ref 6.0–8.5)

## 2020-04-17 LAB — CBC
Hematocrit: 35.6 % (ref 34.0–46.6)
Hemoglobin: 10.2 g/dL — ABNORMAL LOW (ref 11.1–15.9)
MCH: 20.1 pg — ABNORMAL LOW (ref 26.6–33.0)
MCHC: 28.7 g/dL — ABNORMAL LOW (ref 31.5–35.7)
MCV: 70 fL — ABNORMAL LOW (ref 79–97)
Platelets: 341 10*3/uL (ref 150–450)
RBC: 5.07 x10E6/uL (ref 3.77–5.28)
RDW: 24.9 % — ABNORMAL HIGH (ref 11.7–15.4)
WBC: 6.1 10*3/uL (ref 3.4–10.8)

## 2020-04-17 NOTE — Telephone Encounter (Signed)
Pt called back regarding lab results.  °

## 2020-04-17 NOTE — Telephone Encounter (Signed)
Pt is calling back regarding lab work

## 2020-04-18 ENCOUNTER — Telehealth: Payer: Self-pay

## 2020-04-18 NOTE — Telephone Encounter (Signed)
Pt has called 2 times --waiting on a call back

## 2020-04-18 NOTE — Telephone Encounter (Signed)
Returned patient's call. No answer. Left vm.  

## 2020-04-21 NOTE — Telephone Encounter (Signed)
Gave patient lab results again with verbal understanding.

## 2020-04-22 ENCOUNTER — Other Ambulatory Visit: Payer: Self-pay

## 2020-04-22 MED ORDER — QUINAPRIL HCL 20 MG PO TABS: 20.0000 mg | ORAL_TABLET | Freq: Every day | ORAL | 0 refills | Status: DC

## 2020-04-23 ENCOUNTER — Other Ambulatory Visit: Payer: Self-pay | Admitting: *Deleted

## 2020-04-23 MED ORDER — ROSUVASTATIN CALCIUM 40 MG PO TABS: 40.0000 mg | ORAL_TABLET | Freq: Every day | ORAL | 0 refills | Status: DC

## 2020-04-24 ENCOUNTER — Inpatient Hospital Stay (HOSPITAL_COMMUNITY): Payer: Medicare Other | Attending: Hematology | Admitting: Hematology

## 2020-04-24 ENCOUNTER — Encounter (HOSPITAL_COMMUNITY): Payer: Self-pay | Admitting: Hematology

## 2020-04-24 ENCOUNTER — Inpatient Hospital Stay (HOSPITAL_COMMUNITY): Payer: Medicare Other

## 2020-04-24 ENCOUNTER — Other Ambulatory Visit: Payer: Self-pay

## 2020-04-24 VITALS — BP 141/59 | HR 77 | Temp 96.9°F | Resp 18 | Ht 60.0 in | Wt 150.4 lb

## 2020-04-24 DIAGNOSIS — Z823 Family history of stroke: Secondary | ICD-10-CM | POA: Diagnosis not present

## 2020-04-24 DIAGNOSIS — I10 Essential (primary) hypertension: Secondary | ICD-10-CM | POA: Diagnosis not present

## 2020-04-24 DIAGNOSIS — Z803 Family history of malignant neoplasm of breast: Secondary | ICD-10-CM

## 2020-04-24 DIAGNOSIS — Z8679 Personal history of other diseases of the circulatory system: Secondary | ICD-10-CM | POA: Insufficient documentation

## 2020-04-24 DIAGNOSIS — I252 Old myocardial infarction: Secondary | ICD-10-CM | POA: Insufficient documentation

## 2020-04-24 DIAGNOSIS — Z8249 Family history of ischemic heart disease and other diseases of the circulatory system: Secondary | ICD-10-CM | POA: Insufficient documentation

## 2020-04-24 DIAGNOSIS — E785 Hyperlipidemia, unspecified: Secondary | ICD-10-CM | POA: Diagnosis not present

## 2020-04-24 DIAGNOSIS — G4733 Obstructive sleep apnea (adult) (pediatric): Secondary | ICD-10-CM | POA: Diagnosis not present

## 2020-04-24 DIAGNOSIS — D509 Iron deficiency anemia, unspecified: Secondary | ICD-10-CM | POA: Diagnosis not present

## 2020-04-24 DIAGNOSIS — N189 Chronic kidney disease, unspecified: Secondary | ICD-10-CM | POA: Diagnosis not present

## 2020-04-24 DIAGNOSIS — Z8616 Personal history of COVID-19: Secondary | ICD-10-CM | POA: Diagnosis not present

## 2020-04-24 DIAGNOSIS — K921 Melena: Secondary | ICD-10-CM | POA: Diagnosis not present

## 2020-04-24 DIAGNOSIS — Z79899 Other long term (current) drug therapy: Secondary | ICD-10-CM | POA: Insufficient documentation

## 2020-04-24 DIAGNOSIS — K5730 Diverticulosis of large intestine without perforation or abscess without bleeding: Secondary | ICD-10-CM | POA: Insufficient documentation

## 2020-04-24 DIAGNOSIS — Z800 Family history of malignant neoplasm of digestive organs: Secondary | ICD-10-CM | POA: Insufficient documentation

## 2020-04-24 LAB — COMPREHENSIVE METABOLIC PANEL
ALT: 26 U/L (ref 0–44)
AST: 28 U/L (ref 15–41)
Albumin: 4.1 g/dL (ref 3.5–5.0)
Alkaline Phosphatase: 70 U/L (ref 38–126)
Anion gap: 9 (ref 5–15)
BUN: 15 mg/dL (ref 8–23)
CO2: 26 mmol/L (ref 22–32)
Calcium: 9.8 mg/dL (ref 8.9–10.3)
Chloride: 107 mmol/L (ref 98–111)
Creatinine, Ser: 1.05 mg/dL — ABNORMAL HIGH (ref 0.44–1.00)
GFR calc Af Amer: >60 mL/min (ref >60)
GFR calc non Af Amer: 53 mL/min — ABNORMAL LOW (ref >60)
Glucose, Bld: 81 mg/dL (ref 70–99)
Potassium: 3.6 mmol/L (ref 3.5–5.1)
Sodium: 142 mmol/L (ref 135–145)
Total Bilirubin: 0.4 mg/dL (ref 0.3–1.2)
Total Protein: 7.9 g/dL (ref 6.5–8.1)

## 2020-04-24 LAB — CBC WITH DIFFERENTIAL/PLATELET
Abs Immature Granulocytes: 0 10*3/uL (ref 0.00–0.07)
Band Neutrophils: 0 %
Basophils Absolute: 0.1 10*3/uL (ref 0.0–0.1)
Basophils Relative: 1 %
Blasts: 0 %
Eosinophils Absolute: 0.1 10*3/uL (ref 0.0–0.5)
Eosinophils Relative: 2 %
HCT: 37 % (ref 36.0–46.0)
Hemoglobin: 10.6 g/dL — ABNORMAL LOW (ref 12.0–15.0)
Lymphocytes Relative: 31 %
Lymphs Abs: 2 10*3/uL (ref 0.7–4.0)
MCH: 20.5 pg — ABNORMAL LOW (ref 26.0–34.0)
MCHC: 28.6 g/dL — ABNORMAL LOW (ref 30.0–36.0)
MCV: 71.6 fL — ABNORMAL LOW (ref 80.0–100.0)
Metamyelocytes Relative: 0 %
Monocytes Absolute: 0.7 10*3/uL (ref 0.1–1.0)
Monocytes Relative: 11 %
Myelocytes: 0 %
Neutro Abs: 3.5 10*3/uL (ref 1.7–7.7)
Neutrophils Relative %: 55 %
Other: 0 %
Platelets: 321 10*3/uL (ref 150–400)
Promyelocytes Relative: 0 %
RBC: 5.17 MIL/uL — ABNORMAL HIGH (ref 3.87–5.11)
RDW: 25.9 % — ABNORMAL HIGH (ref 11.5–15.5)
WBC: 6.4 10*3/uL (ref 4.0–10.5)
nRBC: 0 % (ref 0.0–0.2)
nRBC: 0 /100 WBC

## 2020-04-24 LAB — LACTATE DEHYDROGENASE: LDH: 171 U/L (ref 98–192)

## 2020-04-24 LAB — RETICULOCYTES
Immature Retic Fract: 36.5 % — ABNORMAL HIGH (ref 2.3–15.9)
RBC.: 5.32 MIL/uL — ABNORMAL HIGH (ref 3.87–5.11)
Retic Count, Absolute: 39.9 10*3/uL (ref 19.0–186.0)
Retic Ct Pct: 0.8 % (ref 0.4–3.1)

## 2020-04-24 LAB — IRON AND TIBC
Iron: 37 ug/dL (ref 28–170)
Saturation Ratios: 7 % — ABNORMAL LOW (ref 10.4–31.8)
TIBC: 531 ug/dL — ABNORMAL HIGH (ref 250–450)
UIBC: 494 ug/dL

## 2020-04-24 LAB — VITAMIN B12: Vitamin B-12: 465 pg/mL (ref 180–914)

## 2020-04-24 LAB — FOLATE: Folate: 42.8 ng/mL (ref >5.9)

## 2020-04-24 LAB — FERRITIN: Ferritin: 7 ng/mL — ABNORMAL LOW (ref 11–307)

## 2020-04-24 NOTE — Patient Instructions (Signed)
[place] at [facility] Discharge Instructions  You were seen today by Dr. Delton Coombes. Dr. Delton Coombes is a hematologist, meaning he specializes in blood disorders.  We will have baseline labs done today. We will arrange for you to have two iron infusion here in the clinic. You should begin taking 1 iron pill daily. You can get this over the counter at any local pharmacy. You can take iron pills in conjunction with iron infusions. The iron infusions act faster to help your body produce red blood cells, which carries iron through your body.   Thank you for choosing [place] at [facility] to provide your oncology and hematology care.  To afford each patient quality time with our provider, please arrive at least 15 minutes before your scheduled appointment time.   If you have a lab appointment with the [facility] please come in thru the Main Entrance and check in at the main information desk.  You need to re-schedule your appointment should you arrive 10 or more minutes late.  We strive to give you quality time with our providers, and arriving late affects you and other patients whose appointments are after yours.  Also, if you no show three or more times for appointments you may be dismissed from the clinic at the providers discretion.     Again, thank you for choosing [facility].  Our hope is that these requests will decrease the amount of time that you wait before being seen by our physicians.       _____________________________________________________________  Should you have questions after your visit to [facility], please contact our office at [phone] and follow the prompts.  Our office hours are 8:00 a.m. and 4:30 p.m. Monday - Friday.  Please note that voicemails left after 4:00 p.m. may not be returned until the following business day.  We are closed weekends and major holidays.  You do have access to a  nurse 24-7, just call the main number to the clinic [phone] and do not press any options, hold on the line and a nurse will answer the phone.    For prescription refill requests, have your pharmacy contact our office and allow 72 hours.    Due to Covid, you will need to wear a mask upon entering the hospital. If you do not have a mask, a mask will be given to you at the Main Entrance upon arrival. For doctor visits, patients may have 1 support person age 3 or older with them. For treatment visits, patients can not have anyone with them due to social distancing guidelines and our immunocompromised population.

## 2020-04-24 NOTE — Progress Notes (Signed)
CONSULT NOTE  Patient Care Team: [name], NP as PCP - General (Family Medicine) Satira Sark, MD as PCP - Cardiology (Cardiology) Gala Romney Cristopher Estimable, MD as Consulting Physician (Gastroenterology)  CHIEF COMPLAINTS/PURPOSE OF CONSULTATION: Anemia  HISTORY OF PRESENTING ILLNESS:  Nicole Horn 74 y.o. female was sent here by her PCP for anemia.  Patient reports she has known she has had anemia for the last 1 year.  She reports in the past she has noticed blood in her stool but no longer notices that.  She had a colonoscopy 2 months ago that was normal.  She denies any blood clots.  She has needed 1 blood transfusion approximately 1 month ago.  She has been diagnosed with CKD.  She reports she was diagnosed with obstructive sleep apnea but no longer wears her CPAP.  She denies any alcohol smoking or illicit drug use.  She reports she has ice pica and eats cups of ice periodically throughout the day.  She reports she was told to start on oral iron tablets however she has not picked them up yet.  She denies any recent bleeding such as epistasis, hematuria or hematochezia.  She has no prior history or diagnosis of cancer.  She has a positive family history for sister with breast cancer, mother with colon cancer, maternal aunt with stomach cancer.  She lives at home and performs all her own ADLs.    MEDICAL HISTORY:  Past Medical History:  Diagnosis Date  . 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 Chronic thumb pain, right 11/13/2019  . Cough 11/13/2019  . Dizziness 03/13/2020  . Essential hypertension   . Exposure to sexually transmitted disease (STD) 12/11/2019  . Heart attack [facility]) 2004   [place] - hospitalized with stress test by report and presumably medical therapy; subsequent cardiac catheterization (no PCI)  . History of COVID-19 08/2019  . History of irregular heartbeat 12/11/2019  . History of myocardial infarct at age less than 73 years 12/11/2019  . History of  syphilis 12/11/2019  . Mixed hyperlipidemia   . PAD (peripheral artery disease) ([place]) 2019   PTCA/stent [place]  . Pneumonia due to COVID-19 virus 09/10/2019  . Sleep apnea    Pt supposed to wear CPAP, but doesn't.  . Type 2 diabetes mellitus ([place])     SURGICAL HISTORY: Past Surgical History:  Procedure Laterality Date  . ABDOMINAL HYSTERECTOMY    . BACK SURGERY    . BARTHOLIN GLAND CYST EXCISION Left 03/04/2015   Procedure: EXCISION OF LEFT BARTHOLINS TUMOR;  Surgeon: [name], MD;  Location: [facility] ORS;  Service: Gynecology;  Laterality: Left;  . CERVICAL SPINE SURGERY    . COLONOSCOPY N/A 02/01/2020   Procedure: COLONOSCOPY;  Surgeon: Daneil Dolin, MD; Scattered medium mouth diverticula in the sigmoid and descending colon, otherwise normal exam.  . CYST REMOVAL TRUNK    . ESOPHAGOGASTRODUODENOSCOPY (EGD) WITH PROPOFOL N/A 03/27/2020   Procedure: ESOPHAGOGASTRODUODENOSCOPY (EGD) WITH PROPOFOL;  Surgeon: Daneil Dolin, MD;  Location: [facility] ENDO SUITE;  Service: Endoscopy;  Laterality: N/A;  1:00pm  . GIVENS CAPSULE STUDY N/A 03/27/2020   Procedure: GIVENS CAPSULE STUDY;  Surgeon: Daneil Dolin, MD;  Location: [facility] END[facility] SUITE;  Service: Endoscopy;  Laterality: N/A;  . Multiple cyst removal surgeries    . Stent of lower extremities      SOCIAL HISTORY: Social History   Socioeconomic History  . Marital status: Widowed    Spouse name: Not on  file  . Number of children: 1  . Years of education: Not on file  . Highest education level: Some college, no degree  Occupational History  . Not on file  Tobacco Use  . Smoking status: Former Smoker    Packs/day: 2.00    Years: 25.00    Pack years: 50.00    Types: Cigarettes    Quit date: 03/10/1985    Years since quitting: 35.1  . Smokeless tobacco: Never Used  Vaping Use  . Vaping Use: Never used  Substance and Sexual Activity  . Alcohol use: Yes    Alcohol/week: 0.0 standard drinks    Comment: socially  . Drug use: No  .  Sexual activity: Not Currently    Birth control/protection: Surgical  Other Topics Concern  . Not on file  Social History Narrative   Lives in [place] and [place] 6 months here and there owns an apt in [place] alone, but helps to care for mother who is 104 years (goes between the kids)   Sister is in [place] is in [place]      Son who is 83 years old lives in [place], [place]    Has 6 grand kids and 7 great grands      Enjoys exercise (harder since COVID)-dancing      Diet: eats all foods-enjoys smoothies, avoids fried foods, limited red meat if any   Caffeine: coffee 1 cup in the morning-sometimes will have 1 in evening    Water: 3-4 bottles daily       Wears seat belt   Smoke detectors in home   Does not use phone while driving            Social Determinants of Health   Financial Resource Strain: Low Risk   . Difficulty of Paying Living Expenses: Not very hard  Food Insecurity: No Food Insecurity  . Worried About Charity fundraiser in the Last Year: Never true  . Ran Out of Food in the Last Year: Never true  Transportation Needs: No Transportation Needs  . Lack of Transportation (Medical): No  . Lack of Transportation (Non-Medical): No  Physical Activity: Inactive  . Days of Exercise per Week: 0 days  . Minutes of Exercise per Session: 0 min  Stress: No Stress Concern Present  . Feeling of Stress : Not at all  Social Connections: Socially Isolated  . Frequency of Communication with Friends and Family: More than three times a week  . Frequency of Social Gatherings with Friends and Family: More than three times a week  . Attends Religious Services: Never  . Active Member of Clubs or Organizations: No  . Attends Archivist Meetings: Never  . Marital Status: Widowed  Intimate Partner Violence: Not At Risk  . Fear of Current or Ex-Partner: No  . Emotionally Abused: No  . Physically Abused: No  . Sexually Abused: No    FAMILY  HISTORY: Family History  Problem Relation Age of Onset  . Colon cancer Mother        In her 70s  . Breast cancer Sister   . Stomach cancer Maternal Aunt   . Healthy Brother   . Stroke Paternal Grandmother   . Heart attack Paternal Grandmother     ALLERGIES:  has No Known Allergies.  MEDICATIONS:  Current Outpatient Medications  Medication Sig Dispense Refill  . albuterol (VENTOLIN HFA) 108 (90 Base) MCG/ACT inhaler Inhale 2 puffs into  the lungs every 4 (four) hours as needed for wheezing or shortness of breath.    Marland Kitchen amLODipine (NORVASC) 10 MG tablet Take 1 tablet (10 mg total) by mouth daily. 90 tablet 1  . ASPIRIN 81 PO Take 81 mg by mouth daily.    . Multiple Vitamin (MULTIVITAMIN WITH MINERALS) TABS tablet Take 1 tablet by mouth daily.    Marland Kitchen olmesartan-hydrochlorothiazide (BENICAR HCT) 20-12.5 MG tablet Take 1 tablet by mouth daily. 30 tablet 11  . rosuvastatin (CRESTOR) 40 MG tablet Take 1 tablet (40 mg total) by mouth daily. 90 tablet 0  . quinapril (ACCUPRIL) 20 MG tablet Take 1 tablet (20 mg total) by mouth daily. (Patient not taking: Reported on 04/24/2020) 90 tablet 0   No current facility-administered medications for this visit.    REVIEW OF SYSTEMS:   Constitutional: Denies fevers, chills or abnormal night sweats,  Respiratory: Denies dyspnea or wheezes, + cough and shortness of breath since she had Covid in January 2021 Cardiovascular: Denies palpitation, chest discomfort or lower extremity swelling Gastrointestinal:  Denies nausea, heartburn or change in bowel habits, + diarrhea Skin: Denies abnormal skin rashes Lymphatics: Denies new lymphadenopathy or easy bruising Neurological:Denies numbness, tingling or new weaknesses, + headaches Behavioral/Psych: Mood is stable, no new changes + depression and sleep issues All other systems were reviewed with the patient and are negative.  PHYSICAL EXAMINATION: ECOG PERFORMANCE STATUS: 1 - Symptomatic but completely  ambulatory  Vitals:   04/24/20 1337  BP: (!) 141/59  Pulse: 77  Resp: 18  Temp: (!) 96.9 F (36.1 C)  SpO2: 99%   Filed Weights   04/24/20 1337  Weight: 150 lb 5.7 oz (68.2 kg)    GENERAL:alert, no distress and comfortable SKIN: skin color, texture, turgor are normal, no rashes or significant lesions NECK: supple, thyroid normal size, non-tender, without nodularity LYMPH:  no palpable lymphadenopathy in the cervical, axillary or inguinal LUNGS: clear to auscultation and percussion with normal breathing effort HEART: regular rate & rhythm and no murmurs and no lower extremity edema ABDOMEN:abdomen soft, non-tender and normal bowel sounds Musculoskeletal:no cyanosis of digits and no clubbing  PSYCH: alert & oriented x 3 with fluent speech NEURO: no focal motor/sensory deficits  LABORATORY DATA:  I have reviewed the data as listed Recent Results (from the past 2160 hour(s))  SARS CORONAVIRUS 2 (TAT 6-24 HRS) Nasopharyngeal Nasopharyngeal Swab     Status: None   Collection Time: 01/30/20  2:30 PM   Specimen: Nasopharyngeal Swab  Result Value Ref Range   SARS Coronavirus 2 NEGATIVE NEGATIVE    Comment: (NOTE) SARS-CoV-2 target nucleic acids are NOT DETECTED.  The SARS-CoV-2 RNA is generally detectable in upper and lower respiratory specimens during the acute phase of infection. Negative results do not preclude SARS-CoV-2 infection, do not rule out co-infections with other pathogens, and should not be used as the sole basis for treatment or other patient management decisions. Negative results must be combined with clinical observations, patient history, and epidemiological information. The expected result is Negative.  Fact Sheet for Patients: SugarRoll.be  Fact Sheet for Healthcare Providers: https://www.woods-mathews.com/  This test is not yet approved or cleared by the [place] FDA and  has been authorized for detection  and/or diagnosis of SARS-CoV-2 by FDA under an Emergency Use Authorization (EUA). This EUA will remain  in effect (meaning this test can be used) for the duration of the COVID-19 declaration under Se ction 564(b)(1) of the Act, 21 U.S.C. section 360bbb-3(b)(1), unless the authorization  is terminated or revoked sooner.  Performed at [facility], [place] [address] [place] [address]   CBC     Status: Abnormal   Collection Time: 03/13/20  3:16 PM  Result Value Ref Range   WBC 7.6 3.4 - 10.8 x10E3/uL   RBC 3.71 (L) 3.77 - 5.28 x10E6/uL   Hemoglobin 6.3 (LL) 11.1 - 15.9 g/dL   Hematocrit 24.3 (L) 34.0 - 46.6 %   MCV 66 (L) 79 - 97 fL   MCH 17.0 (L) 26.6 - 33.0 pg   MCHC 25.9 (L) 31 - 35 g/dL   RDW 18.6 (H) 11.7 - 15.4 %   Platelets 400 150 - 450 x10E3/uL  Comprehensive metabolic panel     Status: Abnormal   Collection Time: 03/13/20  3:16 PM  Result Value Ref Range   Glucose 78 65 - 99 mg/dL   BUN 17 8 - 27 mg/dL   Creatinine, Ser 1.37 (H) 0.57 - 1.00 mg/dL   GFR calc non Af Amer 39 (L) >59 mL/min/1.73   GFR calc Af Amer 44 (L) >59 mL/min/1.73    Comment: **[facility] currently reports eGFR in compliance with the current**   recommendations of the [employer]. [facility] will   update reporting as new guidelines are published from the NKF-ASN   Task force.    BUN/Creatinine Ratio 12 12 - 28   Sodium 143 134 - 144 mmol/L   Potassium 4.1 3.5 - 5.2 mmol/L   Chloride 106 96 - 106 mmol/L   CO2 25 20 - 29 mmol/L   Calcium 9.6 8.7 - 10.3 mg/dL   Total Protein 7.0 6.0 - 8.5 g/dL   Albumin 4.3 3.7 - 4.7 g/dL   Globulin, Total 2.7 1.5 - 4.5 g/dL   Albumin/Globulin Ratio 1.6 1.2 - 2.2   Bilirubin Total 0.2 0.0 - 1.2 mg/dL   Alkaline Phosphatase 72 48 - 121 IU/L   AST 21 0 - 40 IU/L   ALT 16 0 - 32 IU/L  POC occult blood, ED     Status: None   Collection Time: 03/14/20  9:08 AM  Result Value Ref Range   Fecal Occult Bld NEGATIVE NEGATIVE  CBC with  Differential/Platelet     Status: Abnormal   Collection Time: 03/14/20  9:40 AM  Result Value Ref Range   WBC 5.0 4.0 - 10.5 K/uL   RBC 3.48 (L) 3.87 - 5.11 MIL/uL   Hemoglobin 6.0 (LL) 12.0 - 15.0 g/dL    Comment: REPEATED TO VERIFY THIS CRITICAL RESULT HAS VERIFIED AND BEEN CALLED TO FARMER E BY LATISHA HENDERSON ON 07 23 2021 AT 1032, AND HAS BEEN READ BACK.     HCT 22.5 (L) 36 - 46 %   MCV 64.7 (L) 80.0 - 100.0 fL   MCH 17.2 (L) 26.0 - 34.0 pg   MCHC 26.7 (L) 30.0 - 36.0 g/dL   RDW 20.7 (H) 11.5 - 15.5 %   Platelets 347 150 - 400 K/uL   nRBC 0.0 0.0 - 0.2 %   Neutrophils Relative % 49 %   Neutro Abs 2.5 1.7 - 7.7 K/uL   Lymphocytes Relative 33 %   Lymphs Abs 1.7 0.7 - 4.0 K/uL   Monocytes Relative 12 %   Monocytes Absolute 0.6 0 - 1 K/uL   Eosinophils Relative 5 %   Eosinophils Absolute 0.3 0 - 0 K/uL   Basophils Relative 1 %   Basophils Absolute 0.0 0 - 0 K/uL  Immature Granulocytes 0 %   Abs Immature Granulocytes 0.02 0.00 - 0.07 K/uL   Polychromasia PRESENT    Spherocytes PRESENT     Comment: Performed at [facility], [address] [place], [place] [address]  Comprehensive metabolic panel     Status: Abnormal   Collection Time: 03/14/20  9:40 AM  Result Value Ref Range   Sodium 142 135 - 145 mmol/L   Potassium 3.7 3.5 - 5.1 mmol/L   Chloride 108 98 - 111 mmol/L   CO2 25 22 - 32 mmol/L   Glucose, Bld 107 (H) 70 - 99 mg/dL    Comment: Glucose reference range applies only to samples taken after fasting for at least 8 hours.   BUN 17 8 - 23 mg/dL   Creatinine, Ser 1.21 (H) 0.44 - 1.00 mg/dL   Calcium 8.9 8.9 - 10.3 mg/dL   Total Protein 7.1 6.5 - 8.1 g/dL   Albumin 3.9 3.5 - 5.0 g/dL   AST 25 15 - 41 U/L   ALT 18 0 - 44 U/L   Alkaline Phosphatase 57 38 - 126 U/L   Total Bilirubin 0.4 0.3 - 1.2 mg/dL   GFR calc non Af Amer 45 (L) >60 mL/min   GFR calc Af Amer 52 (L) >60 mL/min   Anion gap 9 5 - 15    Comment: Performed at [facility], [address],  [place], [place] [address]  Type and screen     Status: None   Collection Time: 03/14/20  9:40 AM  Result Value Ref Range   ABO/RH(D) B POS    Antibody Screen NEG    Sample Expiration 03/17/2020,2359    Unit Number [id number]    Blood Component Type RBC LR PHER1    Unit division 00    Status of Unit ISSUED,FINAL    Transfusion Status OK TO TRANSFUSE    Crossmatch Result      Compatible Performed at [facility], [address], [place], [place] [address]    Unit Number [id number]    Blood Component Type RED CELLS,LR    Unit division 00    Status of Unit ISSUED,FINAL    Transfusion Status OK TO TRANSFUSE    Crossmatch Result Compatible   Vitamin B12     Status: None   Collection Time: 03/14/20  9:40 AM  Result Value Ref Range   Vitamin B-12 420 180 - 914 pg/mL    Comment: (NOTE) This assay is not validated for testing neonatal or myeloproliferative syndrome specimens for Vitamin B12 levels. Performed at [facility], [address] [place], [place] [address]   Folate     Status: None   Collection Time: 03/14/20  9:40 AM  Result Value Ref Range   Folate 40.0 >5.9 ng/mL    Comment: RESULTS CONFIRMED BY MANUAL DILUTION Performed at [facility], [address], [place], [place] [address]   Iron and TIBC     Status: Abnormal   Collection Time: 03/14/20  9:40 AM  Result Value Ref Range   Iron 16 (L) 28 - 170 ug/dL   TIBC 509 (H) 250 - 450 ug/dL   Saturation Ratios 3 (L) 10.4 - 31.8 %   UIBC 493 ug/dL    Comment: Performed at [facility] [address], [place], [place] [address]  Ferritin     Status: Abnormal   Collection Time: 03/14/20  9:40 AM  Result Value Ref Range   Ferritin 3 (L) 11 - 307 ng/mL  Comment: Performed at [facility], [address], [place], [place] [address]  Reticulocytes     Status: Abnormal   Collection Time: 03/14/20  9:40 AM  Result Value Ref Range   Retic Ct Pct 1.4 0.4 - 3.1 %   RBC. 3.48 (L) 3.87 - 5.11 MIL/uL   Retic Count, Absolute 49.1  19.0 - 186.0 K/uL   Immature Retic Fract 34.6 (H) 2.3 - 15.9 %    Comment: Performed at [facility], [address], [place], [place] [address]  BPAM RBC     Status: None   Collection Time: 03/14/20  9:40 AM  Result Value Ref Range   ISSUE DATE / TIME 203559741638    Blood Product Unit Number [id number]    PRODUCT CODE [id number]    Unit Type and Rh 9500    Blood Product Expiration Date 048889169450    ISSUE DATE / TIME 388828003491    Blood Product Unit Number [id number]    PRODUCT CODE [id number]    Unit Type and Rh 1700    Blood Product Expiration Date 270786754492   Prepare RBC (crossmatch)     Status: None   Collection Time: 03/14/20 10:49 AM  Result Value Ref Range   Order Confirmation      ORDER PROCESSED BY BLOOD BANK Performed at [facility], [address] [place], [place] [address]   SARS Coronavirus 2 by RT PCR (hospital order, performed in [facility] hospital lab) Nasopharyngeal Nasopharyngeal Swab     Status: None   Collection Time: 03/14/20 11:25 AM   Specimen: Nasopharyngeal Swab  Result Value Ref Range   SARS Coronavirus 2 NEGATIVE NEGATIVE    Comment: (NOTE) SARS-CoV-2 target nucleic acids are NOT DETECTED.  The SARS-CoV-2 RNA is generally detectable in upper and lower respiratory specimens during the acute phase of infection. The lowest concentration of SARS-CoV-2 viral copies this assay can detect is 250 copies / mL. A negative result does not preclude SARS-CoV-2 infection and should not be used as the sole basis for treatment or other patient management decisions.  A negative result may occur with improper specimen collection / handling, submission of specimen other than nasopharyngeal swab, presence of viral mutation(s) within the areas targeted by this assay, and inadequate number of viral copies (<250 copies / mL). A negative result must be combined with clinical observations, patient history, and epidemiological information.  Fact Sheet for  Patients:   StrictlyIdeas.no  Fact Sheet for Healthcare Providers: BankingDealers.co.za  This test is not yet approved or  cleared by the [place] FDA and has been authorized for detection and/or diagnosis of SARS-CoV-2 by FDA under an Emergency Use Authorization (EUA).  This EUA will remain in effect (meaning this test can be used) for the duration of the COVID-19 declaration under Section 564(b)(1) of the Act, 21 U.S.C. section 360bbb-3(b)(1), unless the authorization is terminated or revoked sooner.  Performed at [facility], [address] [place], [place] [address]   CBC     Status: Abnormal   Collection Time: 03/15/20  6:11 AM  Result Value Ref Range   WBC 7.2 4.0 - 10.5 K/uL   RBC 4.16 3.87 - 5.11 MIL/uL   Hemoglobin 8.4 (L) 12.0 - 15.0 g/dL    Comment: REPEATED TO VERIFY POST TRANSFUSION SPECIMEN Reticulocyte Hemoglobin testing may be clinically indicated, consider ordering this additional test [id number]    HCT 29.8 (L) 36 - 46 %   MCV 71.6 (L) 80.0 - 100.0 fL    Comment:  POST TRANSFUSION SPECIMEN REPEATED TO VERIFY DELTA CHECK NOTED    MCH 20.2 (L) 26.0 - 34.0 pg   MCHC 28.2 (L) 30.0 - 36.0 g/dL   RDW 25.3 (H) 11.5 - 15.5 %   Platelets 304 150 - 400 K/uL   nRBC 0.0 0.0 - 0.2 %    Comment: Performed at [facility], [address] [place], [place] [address]  Basic metabolic panel     Status: Abnormal   Collection Time: 03/15/20  6:11 AM  Result Value Ref Range   Sodium 144 135 - 145 mmol/L   Potassium 3.7 3.5 - 5.1 mmol/L   Chloride 111 98 - 111 mmol/L   CO2 26 22 - 32 mmol/L   Glucose, Bld 98 70 - 99 mg/dL    Comment: Glucose reference range applies only to samples taken after fasting for at least 8 hours.   BUN 17 8 - 23 mg/dL   Creatinine, Ser 1.16 (H) 0.44 - 1.00 mg/dL   Calcium 8.8 (L) 8.9 - 10.3 mg/dL   GFR calc non Af Amer 47 (L) >60 mL/min   GFR calc Af Amer 54 (L) >60 mL/min   Anion gap 7 5 - 15     Comment: Performed at [facility], [address] [place], [place] [address]  Urinalysis, Routine w reflex microscopic     Status: Abnormal   Collection Time: 03/15/20  2:47 PM  Result Value Ref Range   Color, Urine STRAW (A) YELLOW   APPearance CLEAR CLEAR   Specific Gravity, Urine 1.004 (L) 1.005 - 1.030   pH 8.0 5.0 - 8.0   Glucose, UA NEGATIVE NEGATIVE mg/dL   Hgb urine dipstick SMALL (A) NEGATIVE   Bilirubin Urine NEGATIVE NEGATIVE   Ketones, ur NEGATIVE NEGATIVE mg/dL   Protein, ur NEGATIVE NEGATIVE mg/dL   Nitrite NEGATIVE NEGATIVE   Leukocytes,Ua NEGATIVE NEGATIVE   RBC / HPF 0-5 0 - 5 RBC/hpf   WBC, UA 0-5 0 - 5 WBC/hpf   Bacteria, UA NONE SEEN NONE SEEN   Squamous Epithelial / LPF 0-5 0 - 5    Comment: Performed at [facility], [address], [place], [place] [address]  SARS CORONAVIRUS 2 (TAT 6-24 HRS) Nasopharyngeal Nasopharyngeal Swab     Status: None   Collection Time: 03/26/20  9:45 AM   Specimen: Nasopharyngeal Swab  Result Value Ref Range   SARS Coronavirus 2 NEGATIVE NEGATIVE    Comment: (NOTE) SARS-CoV-2 target nucleic acids are NOT DETECTED.  The SARS-CoV-2 RNA is generally detectable in upper and lower respiratory specimens during the acute phase of infection. Negative results do not preclude SARS-CoV-2 infection, do not rule out co-infections with other pathogens, and should not be used as the sole basis for treatment or other patient management decisions. Negative results must be combined with clinical observations, patient history, and epidemiological information. The expected result is Negative.  Fact Sheet for Patients: SugarRoll.be  Fact Sheet for Healthcare Providers: https://www.woods-mathews.com/  This test is not yet approved or cleared by the [place] FDA and  has been authorized for detection and/or diagnosis of SARS-CoV-2 by FDA under an Emergency Use Authorization (EUA). This EUA will  remain  in effect (meaning this test can be used) for the duration of the COVID-19 declaration under Se ction 564(b)(1) of the Act, 21 U.S.C. section 360bbb-3(b)(1), unless the authorization is terminated or revoked sooner.  Performed at [facility], [place] [address], [place], [place] [address]   CBC with Differential/Platelet  Status: Abnormal   Collection Time: 03/26/20 10:29 AM  Result Value Ref Range   WBC 5.5 4.0 - 10.5 K/uL   RBC 4.81 3.87 - 5.11 MIL/uL   Hemoglobin 9.7 (L) 12.0 - 15.0 g/dL   HCT 34.1 (L) 36 - 46 %   MCV 70.9 (L) 80.0 - 100.0 fL   MCH 20.2 (L) 26.0 - 34.0 pg   MCHC 28.4 (L) 30.0 - 36.0 g/dL   RDW 25.0 (H) 11.5 - 15.5 %   Platelets 326 150 - 400 K/uL    Comment: PLATELET COUNT CONFIRMED BY SMEAR SPECIMEN CHECKED FOR CLOTS    nRBC 0.0 0.0 - 0.2 %   Neutrophils Relative % 58 %   Neutro Abs 3.1 1.7 - 7.7 K/uL   Lymphocytes Relative 27 %   Lymphs Abs 1.5 0.7 - 4.0 K/uL   Monocytes Relative 10 %   Monocytes Absolute 0.6 0 - 1 K/uL   Eosinophils Relative 4 %   Eosinophils Absolute 0.2 0 - 0 K/uL   Basophils Relative 1 %   Basophils Absolute 0.1 0 - 0 K/uL   Immature Granulocytes 0 %   Abs Immature Granulocytes 0.01 0.00 - 0.07 K/uL    Comment: Performed at [facility], [address], [place], [place] [address]  Glucose, capillary     Status: Abnormal   Collection Time: 03/27/20 11:07 AM  Result Value Ref Range   Glucose-Capillary 101 (H) 70 - 99 mg/dL    Comment: Glucose reference range applies only to samples taken after fasting for at least 8 hours.  Glucose, capillary     Status: None   Collection Time: 03/27/20 12:19 PM  Result Value Ref Range   Glucose-Capillary 92 70 - 99 mg/dL    Comment: Glucose reference range applies only to samples taken after fasting for at least 8 hours.  CBC     Status: Abnormal   Collection Time: 04/16/20 11:41 AM  Result Value Ref Range   WBC 6.1 3.4 - 10.8 x10E3/uL   RBC 5.07 3.77 - 5.28 x10E6/uL    Hemoglobin 10.2 (L) 11.1 - 15.9 g/dL   Hematocrit 35.6 34.0 - 46.6 %   MCV 70 (L) 79 - 97 fL   MCH 20.1 (L) 26.6 - 33.0 pg   MCHC 28.7 (L) 31 - 35 g/dL   RDW 24.9 (H) 11.7 - 15.4 %   Platelets 341 150 - 450 x10E3/uL  Comprehensive metabolic panel     Status: Abnormal   Collection Time: 04/16/20 11:41 AM  Result Value Ref Range   Glucose 94 65 - 99 mg/dL   BUN 14 8 - 27 mg/dL   Creatinine, Ser 1.05 (H) 0.57 - 1.00 mg/dL   GFR calc non Af Amer 53 (L) >59 mL/min/1.73   GFR calc Af Amer 61 >59 mL/min/1.73    Comment: **[facility] currently reports eGFR in compliance with the current**   recommendations of the [employer]. [facility] will   update reporting as new guidelines are published from the NKF-ASN   Task force.    BUN/Creatinine Ratio 13 12 - 28   Sodium 143 134 - 144 mmol/L   Potassium 4.1 3.5 - 5.2 mmol/L   Chloride 105 96 - 106 mmol/L   CO2 25 20 - 29 mmol/L   Calcium 9.9 8.7 - 10.3 mg/dL   Total Protein 7.2 6.0 - 8.5 g/dL   Albumin 4.3 3.7 - 4.7 g/dL   Globulin, Total 2.9 1.5 - 4.5 g/dL  Albumin/Globulin Ratio 1.5 1.2 - 2.2   Bilirubin Total 0.3 0.0 - 1.2 mg/dL   Alkaline Phosphatase 85 48 - 121 IU/L   AST 25 0 - 40 IU/L   ALT 22 0 - 32 IU/L  Folate     Status: None   Collection Time: 04/24/20  2:49 PM  Result Value Ref Range   Folate 42.8 >5.9 ng/mL    Comment: RESULTS CONFIRMED BY MANUAL DILUTION Performed at [facility], [address], [place], [place] [address]   Reticulocytes     Status: Abnormal   Collection Time: 04/24/20  2:49 PM  Result Value Ref Range   Retic Ct Pct 0.8 0.4 - 3.1 %   RBC. 5.32 (H) 3.87 - 5.11 MIL/uL   Retic Count, Absolute 39.9 19.0 - 186.0 K/uL   Immature Retic Fract 36.5 (H) 2.3 - 15.9 %    Comment: Performed at [facility], [address] [place], [place] [address]  Comprehensive metabolic panel     Status: Abnormal   Collection Time: 04/24/20  2:49 PM  Result Value Ref Range   Sodium 142 135 - 145 mmol/L    Potassium 3.6 3.5 - 5.1 mmol/L   Chloride 107 98 - 111 mmol/L   CO2 26 22 - 32 mmol/L   Glucose, Bld 81 70 - 99 mg/dL    Comment: Glucose reference range applies only to samples taken after fasting for at least 8 hours.   BUN 15 8 - 23 mg/dL   Creatinine, Ser 1.05 (H) 0.44 - 1.00 mg/dL   Calcium 9.8 8.9 - 10.3 mg/dL   Total Protein 7.9 6.5 - 8.1 g/dL   Albumin 4.1 3.5 - 5.0 g/dL   AST 28 15 - 41 U/L   ALT 26 0 - 44 U/L   Alkaline Phosphatase 70 38 - 126 U/L   Total Bilirubin 0.4 0.3 - 1.2 mg/dL   GFR calc non Af Amer 53 (L) >60 mL/min   GFR calc Af Amer >60 >60 mL/min   Anion gap 9 5 - 15    Comment: Performed at [facility], [address] [place], [place] [address]  CBC with Differential/Platelet     Status: Abnormal (Preliminary result)   Collection Time: 04/24/20  2:49 PM  Result Value Ref Range   WBC 6.4 4.0 - 10.5 K/uL   RBC 5.17 (H) 3.87 - 5.11 MIL/uL   Hemoglobin 10.6 (L) 12.0 - 15.0 g/dL   HCT 37.0 36 - 46 %   MCV 71.6 (L) 80.0 - 100.0 fL   MCH 20.5 (L) 26.0 - 34.0 pg   MCHC 28.6 (L) 30.0 - 36.0 g/dL   RDW 25.9 (H) 11.5 - 15.5 %   Platelets 321 150 - 400 K/uL   nRBC 0.0 0.0 - 0.2 %    Comment: Performed at [facility], [address], [place], [place] [address]   Neutrophils Relative % PENDING %   Neutro Abs PENDING 1.7 - 7.7 K/uL   Band Neutrophils PENDING %   Lymphocytes Relative PENDING %   Lymphs Abs PENDING 0.7 - 4.0 K/uL   Monocytes Relative PENDING %   Monocytes Absolute PENDING 0 - 1 K/uL   Eosinophils Relative PENDING %   Eosinophils Absolute PENDING 0 - 0 K/uL   Basophils Relative PENDING %   Basophils Absolute PENDING 0 - 0 K/uL   WBC Morphology PENDING    RBC Morphology PENDING    Smear Review PENDING    Other PENDING %  nRBC PENDING 0 /100 WBC   Metamyelocytes Relative PENDING %   Myelocytes PENDING %   Promyelocytes Relative PENDING %   Blasts PENDING %   Immature Granulocytes PENDING %   Abs Immature Granulocytes PENDING 0.00 - 0.07 K/uL   Lactate dehydrogenase     Status: None   Collection Time: 04/24/20  2:49 PM  Result Value Ref Range   LDH 171 98 - 192 U/L    Comment: Performed at [facility], [address] [place], [place] [address]    RADIOGRAPHIC STUDIES: I have personally reviewed the radiological images as listed and agreed with the findings in the report. I have independently examined and elicited history from this patient.  I agree with HPI written by my nurse practitioner Wenda Low, FNP.  I have independently formulated my assessment and plan.  ASSESSMENT & PLAN:  1.  Microcytic anemia: -CBC on 04/16/2020 shows hemoglobin 10.2 with MCV of 70. -Recent drop in hemoglobin to 6 and MCV to 64.7 on 03/14/2020, status post 2 units PRBC transfusion.  Ferritin was 3 with normal folic acid and U04. -Colonoscopy on 02/01/2020 shows diverticulosis in the sigmoid colon and the descending colon. -EGD on 03/27/2020 shows normal esophagus, normal stomach, 2 small duodenal erosions. -CTAP on 03/15/2020 shows normal liver and normal-sized spleen. -She was taking iron tablet daily for 4 months prior to hospitalization and transfusion. -We will check her CBC, ferritin and iron panel.  We will also check for other nutritional deficiencies including V40, folic acid, copper and LDH.  We will also check serum protein electrophoresis. -Differential diagnosis includes combination anemia from CKD and iron deficiency state. -I have talked to her about parenteral iron therapy with Feraheme weekly x2. -We talked about side effects including severe allergic reactions. -We will plan to see her back in 6 weeks for follow-up.  2.  Family history: -Sister had breast cancer.  Mother had colon cancer.  Maternal aunt had stomach cancer.   All questions were answered. The patient knows to call the clinic with any problems, questions or concerns.     Derek Jack, MD 04/24/20 6:39 PM

## 2020-04-25 LAB — PROTEIN ELECTROPHORESIS, SERUM
A/G Ratio: 1.1 (ref 0.7–1.7)
Albumin ELP: 3.7 g/dL (ref 2.9–4.4)
Alpha-1-Globulin: 0.2 g/dL (ref 0.0–0.4)
Alpha-2-Globulin: 0.9 g/dL (ref 0.4–1.0)
Beta Globulin: 1.1 g/dL (ref 0.7–1.3)
Gamma Globulin: 1.1 g/dL (ref 0.4–1.8)
Globulin, Total: 3.3 g/dL (ref 2.2–3.9)
Total Protein ELP: 7 g/dL (ref 6.0–8.5)

## 2020-04-25 LAB — HAPTOGLOBIN: Haptoglobin: 209 mg/dL (ref 42–346)

## 2020-04-25 LAB — VITAMIN D 25 HYDROXY (VIT D DEFICIENCY, FRACTURES): Vit D, 25-Hydroxy: 34.12 ng/mL (ref 30–100)

## 2020-04-29 ENCOUNTER — Encounter: Payer: Self-pay | Admitting: Internal Medicine

## 2020-04-29 ENCOUNTER — Other Ambulatory Visit: Payer: Self-pay

## 2020-04-29 ENCOUNTER — Ambulatory Visit (INDEPENDENT_AMBULATORY_CARE_PROVIDER_SITE_OTHER): Payer: Medicare Other | Admitting: Internal Medicine

## 2020-04-29 ENCOUNTER — Other Ambulatory Visit (HOSPITAL_COMMUNITY): Payer: Medicare Other

## 2020-04-29 DIAGNOSIS — R0609 Other forms of dyspnea: Secondary | ICD-10-CM

## 2020-04-29 DIAGNOSIS — R0600 Dyspnea, unspecified: Secondary | ICD-10-CM

## 2020-04-29 DIAGNOSIS — I10 Essential (primary) hypertension: Secondary | ICD-10-CM | POA: Diagnosis not present

## 2020-04-29 DIAGNOSIS — R05 Cough: Secondary | ICD-10-CM

## 2020-04-29 DIAGNOSIS — R058 Other specified cough: Secondary | ICD-10-CM

## 2020-04-29 MED ORDER — OLMESARTAN MEDOXOMIL-HCTZ 20-12.5 MG PO TABS
1.0000 | ORAL_TABLET | Freq: Every day | ORAL | 0 refills | Status: DC
Start: 2020-04-29 — End: 2020-06-16

## 2020-04-29 NOTE — Assessment & Plan Note (Signed)
Improved off acei 04/29/2020   Upper airway cough syndrome (previously labeled PNDS),  is so named because it's frequently impossible to sort out how much is  CR/sinusitis with freq throat clearing (which can be related to primary GERD)   vs  causing  secondary (" extra esophageal")  GERD from wide swings in gastric pressure that occur with throat clearing, often  promoting self use of mint and menthol lozenges that reduce the lower esophageal sphincter tone and exacerbate the problem further in a cyclical fashion.   These are the same pts (now being labeled as having "irritable larynx syndrome" by some cough centers) who not infrequently have a history of having failed to tolerate ace inhibitors,  dry powder inhalers or biphosphonates or report having atypical/extraesophageal reflux symptoms that don't respond to standard doses of PPI  and are easily confused as having aecopd or asthma flares by even experienced allergists/ pulmonologists (myself included).   >>>  rec avoid throat clearing with use of hard rock candies/ avoid mint/ menthol  - see avs for instructions unique to this ov  ENT f/u is prn          Each maintenance medication was reviewed in detail including emphasizing most importantly the difference between maintenance and prns and under what circumstances the prns are to be triggered using an action plan format where appropriate.  Total time for H and P, chart review, counseling,   and generating customized AVS unique to this office visit / charting = 34 min

## 2020-04-29 NOTE — Assessment & Plan Note (Addendum)
D/c acei 03/10/2020 due to cough/sob p covid > much better  04/29/2020   Although even in retrospect it may not be clear the ACEi contributed to the pt's symptoms,  Pt improved off them and adding them back at this point or in the future would risk confusion in interpretation of non-specific respiratory symptoms to which this patient is prone  ie  Better not to muddy the waters here.    rec continue benicar 20-12.5 mg daily and f/u pcp

## 2020-04-29 NOTE — Progress Notes (Signed)
Nicole Horn, female    DOB: 07-Jun-1946,     MRN: [id number]   Brief patient profile:  78 yowf quit smoking 1986    Admit date: 09/10/2019 Discharge date: 09/15/2019 Equipment/Devices: home O2, 2L [place] Discharge Condition: Stable CODE STATUS: Full code Diet recommendation: Diabetic  HPI: Per admitting MD, 74 y.o.female,with past medical history of hypertension, hyperlipidemia, diabetes mellitus diet-controlled, apnea does not wear CPAP, patient was diagnosed with COVID-19 1/14, she was referred to monoclonal antibody infusion clinic, patient was noted to be short of breath, with some increased work of breathing, her oxygen saturation was 77%, she was referred to [facility] for direct admission for her acute hypoxic respiratory failure from COVID-19 for pneumonia, and reports she has been feeling weak, with poor appetite, generalized body ache x one  week, diagnosed with COVID-19 1/14, reports her dyspnea x 24 hours PTA  denies diarrhea, she does report some cough with blood-tinged sputum.    Hospital Course / Discharge diagnoses: Acute Hypoxic Respiratory Failure due to Covid-19 Viral Illness /post Covid pneumonitis-patient was admitted to the hospital as she was found hypoxic in the infusion center.  She required 2 L while hospitalized, completed remdesivir course in the hospital and she was also placed on steroids.  Clinically she has improved, her shortness of breath has completely resolved, she is able to ambulate without difficulties, her chest x-ray showed slight improvement however she was still hypoxic requiring 2 L nasal cannula.  She does have a remote history of tobacco use which is potentially contributing.  She received several doses of IV Lasix while hospitalized.  Given clinical return to baseline she will be discharged home in stable condition with home oxygen.  She was advised to see PCP within couple of weeks.  Her CRP, ferritin, D-dimer are all normal on  discharge.  COVID-19 Labs  Recent Labs (last 2 labs)        Recent Labs    09/13/19 0620 09/14/19 0507 09/15/19 0505  DDIMER <0.27 0.33 <0.27  FERRITIN 51 40 34  CRP 1.3* 0.9 0.7      Recent Labs       Lab Results  Component Value Date   SARSCOV2NAA POSITIVE (A) 09/05/2019   [place] Not Detected 01/30/2019     Active Problems Essential hypertension -continue home medications on discharge Hyperlipidemia -Continue statin Hypokalemia -Repleted, her potassium has been and has remained normal, hold home potassium supplements until she gets blood work done as an outpatient History of DM 2 -diet controlled, A1c 6.0. CBGs acceptable as below, continue sliding scale       History of Present Illness  03/10/2020  Pulmonary/ 1st office eval/Cleotilde Spadaccini  Re cough and doe on accupril  Chief Complaint  Patient presents with  . Consult    Patient had Covid in Jan. Shortness of breath with exertion.  Dry Cough    Dyspnea:  2 blocks / one flight and has to stop at top  Cough: dry, worse with voice>>>  Has severe coughing fits several times a day to point of feeling choked Sleep: after wakes up starts with cough  SABA use: typically p activity never pre challenges or rechallenges  02 stopped in April but not checking sats  rec Only use your albuterol as a rescue medication  Also ok > Try albuterol 15 min before an activity that you know would make you short of breath and see if it makes any difference and if makes none then don't take it  after activity unless you can't catch your breath. Make sure you check your oxygen saturations at highest level of activity    Stop quinapril and hydrochlorothiazide   and replace with benicar(olmesartan)  20/12.5 mg one daily in its place   04/29/2020  f/u ov/[place] office/Cavin Longman re:  Doe/ cough better off acei  Chief Complaint  Patient presents with  . Follow-up    Breathing has improved and she is coughing less since the last visit.    Dyspnea:  Not limited by breathing from desired activities   Cough: still some urge to clear  Sleeping: no longer wakes up coughing  SABA use: none 02: none    No obvious day to day or daytime variability or assoc excess/ purulent sputum or mucus plugs or hemoptysis or cp or chest tightness, subjective wheeze or overt sinus or hb symptoms.   Sleeping now  without nocturnal  or early am exacerbation  of respiratory  c/o's or need for noct saba. Also denies any obvious fluctuation of symptoms with weather or environmental changes or other aggravating or alleviating factors except as outlined above   No unusual exposure hx or h/o childhood pna/ asthma or knowledge of premature birth.  Current Allergies, Complete Past Medical History, Past Surgical History, Family History, and Social History were reviewed in [employer] record.  ROS  The following are not active complaints unless bolded Hoarseness, sore throat, dysphagia, dental problems, itching, sneezing,  nasal congestion or discharge of excess mucus or purulent secretions, ear ache,   fever, chills, sweats, unintended wt loss or wt gain, classically pleuritic or exertional cp,  orthopnea pnd or arm/hand swelling  or leg swelling, presyncope, palpitations, abdominal pain, anorexia, nausea, vomiting, diarrhea  or change in bowel habits or change in bladder habits, change in stools or change in urine, dysuria, hematuria,  rash, arthralgias, visual complaints, headache, numbness, weakness or ataxia or problems with walking or coordination,  change in mood or  memory.        Current Meds  Medication Sig  . albuterol (VENTOLIN HFA) 108 (90 Base) MCG/ACT inhaler Inhale 2 puffs into the lungs every 4 (four) hours as needed for wheezing or shortness of breath.  Marland Kitchen amLODipine (NORVASC) 10 MG tablet Take 1 tablet (10 mg total) by mouth daily.  . ASPIRIN 81 PO Take 81 mg by mouth daily.  . Multiple Vitamin (MULTIVITAMIN WITH  MINERALS) TABS tablet Take 1 tablet by mouth daily.  Marland Kitchen olmesartan-hydrochlorothiazide (BENICAR HCT) 20-12.5 MG tablet Take 1 tablet by mouth daily.  . rosuvastatin (CRESTOR) 40 MG tablet Take 1 tablet (40 mg total) by mouth daily.               Past Medical History:  Diagnosis Date  . 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 Essential hypertension   . Heart attack [facility]) 2004   [place] - hospitalized with stress test by report and presumably medical therapy; subsequent cardiac catheterization (no PCI)  . Mixed hyperlipidemia   . PAD (peripheral artery disease) ([place]) 2019   PTCA/stent [place]  . Pneumonia due to COVID-19 virus 09/10/2019  . Sleep apnea    Pt supposed to wear CPAP, but doesn't.  . Type 2 diabetes mellitus ([place])       Objective:     amb bf nad  Wt Readings from Last 3 Encounters:  04/29/20 152 lb (68.9 kg)  04/24/20 150 lb 5.7 oz (68.2 kg)  04/16/20 151 lb 6.4 oz (68.7  kg)     Vital signs reviewed - Note on arrival 04/29/2020  02 sats  99% on RA and bp 128/60                 Assessment

## 2020-04-29 NOTE — Patient Instructions (Addendum)
Avoid chocolate, peppermint, colas, red wine, and acidic juices such as orange juice.  NO MINT OR MENTHOL PRODUCTS SO NO COUGH DROPS  USE SUGARLESS CANDY INSTEAD (Jolley ranchers or Stover's or Life Savers) or even ice chips will also do - the key is to swallow to prevent all throat clearing. NO OIL BASED VITAMINS - use powdered substitutes.  Avoid fish oil also.   If you are satisfied with your treatment plan,  let your doctor know and he/she can either refill your medications or you can return here when your prescription runs out.     If in any way you are not 100% satisfied,  please tell us.  If 100% better, tell your friends!  Pulmonary follow up is as needed

## 2020-04-29 NOTE — Assessment & Plan Note (Signed)
Onset with covid 19 pna 09/06/19  - quit smoking 1986  -  03/10/2020   Walked RA  approx   400  ft  @ fast pace  stopped due to  End of study, sats still  98%  - trial off acei  03/10/2020 >>>  Much improved 04/29/2020   She still has some residual throat clearing but overall doing much better and no further pulmonary eval for this problem needed.

## 2020-05-01 ENCOUNTER — Ambulatory Visit (HOSPITAL_COMMUNITY)
Admission: RE | Admit: 2020-05-01 | Discharge: 2020-05-01 | Disposition: A | Discharge status: Home / Self Care | Payer: Medicare Other | Source: Ambulatory Visit | Attending: Family Medicine | Admitting: Family Medicine

## 2020-05-01 ENCOUNTER — Other Ambulatory Visit: Payer: Self-pay

## 2020-05-01 DIAGNOSIS — Z780 Asymptomatic menopausal state: Secondary | ICD-10-CM | POA: Insufficient documentation

## 2020-05-01 DIAGNOSIS — Z1231 Encounter for screening mammogram for malignant neoplasm of breast: Secondary | ICD-10-CM | POA: Diagnosis not present

## 2020-05-01 DIAGNOSIS — M85851 Other specified disorders of bone density and structure, right thigh: Secondary | ICD-10-CM | POA: Diagnosis not present

## 2020-05-01 LAB — COPPER, SERUM: Copper: 103 ug/dL (ref 80–158)

## 2020-05-02 ENCOUNTER — Encounter (HOSPITAL_COMMUNITY): Payer: Self-pay

## 2020-05-02 ENCOUNTER — Inpatient Hospital Stay (HOSPITAL_COMMUNITY): Payer: Medicare Other

## 2020-05-02 ENCOUNTER — Other Ambulatory Visit (HOSPITAL_COMMUNITY): Payer: Self-pay | Admitting: Nurse Practitioner

## 2020-05-02 VITALS — BP 138/57 | HR 70 | Temp 96.4°F | Resp 18

## 2020-05-02 DIAGNOSIS — G4733 Obstructive sleep apnea (adult) (pediatric): Secondary | ICD-10-CM | POA: Diagnosis not present

## 2020-05-02 DIAGNOSIS — K5730 Diverticulosis of large intestine without perforation or abscess without bleeding: Secondary | ICD-10-CM | POA: Diagnosis not present

## 2020-05-02 DIAGNOSIS — Z8249 Family history of ischemic heart disease and other diseases of the circulatory system: Secondary | ICD-10-CM | POA: Diagnosis not present

## 2020-05-02 DIAGNOSIS — I10 Essential (primary) hypertension: Secondary | ICD-10-CM | POA: Diagnosis not present

## 2020-05-02 DIAGNOSIS — D509 Iron deficiency anemia, unspecified: Secondary | ICD-10-CM | POA: Diagnosis not present

## 2020-05-02 DIAGNOSIS — Z803 Family history of malignant neoplasm of breast: Secondary | ICD-10-CM | POA: Diagnosis not present

## 2020-05-02 DIAGNOSIS — N189 Chronic kidney disease, unspecified: Secondary | ICD-10-CM | POA: Diagnosis not present

## 2020-05-02 DIAGNOSIS — E785 Hyperlipidemia, unspecified: Secondary | ICD-10-CM | POA: Diagnosis not present

## 2020-05-02 DIAGNOSIS — Z79899 Other long term (current) drug therapy: Secondary | ICD-10-CM | POA: Diagnosis not present

## 2020-05-02 DIAGNOSIS — K921 Melena: Secondary | ICD-10-CM | POA: Diagnosis not present

## 2020-05-02 DIAGNOSIS — I252 Old myocardial infarction: Secondary | ICD-10-CM | POA: Diagnosis not present

## 2020-05-02 DIAGNOSIS — Z800 Family history of malignant neoplasm of digestive organs: Secondary | ICD-10-CM | POA: Diagnosis not present

## 2020-05-02 DIAGNOSIS — Z8679 Personal history of other diseases of the circulatory system: Secondary | ICD-10-CM | POA: Diagnosis not present

## 2020-05-02 DIAGNOSIS — Z823 Family history of stroke: Secondary | ICD-10-CM | POA: Diagnosis not present

## 2020-05-02 MED ORDER — LORATADINE 10 MG PO TABS
10.0000 mg | ORAL_TABLET | Freq: Once | ORAL | Status: AC
  Administered 2020-05-02: 10 mg via ORAL

## 2020-05-02 MED ORDER — ACETAMINOPHEN 325 MG PO TABS
ORAL_TABLET | ORAL | Status: AC
  Filled 2020-05-02: qty 2

## 2020-05-02 MED ORDER — LORATADINE 10 MG PO TABS
ORAL_TABLET | ORAL | Status: AC
  Filled 2020-05-02: qty 1

## 2020-05-02 MED ORDER — SODIUM CHLORIDE 0.9 % IV SOLN
510.0000 mg | Freq: Once | INTRAVENOUS | Status: AC
  Administered 2020-05-02: 510 mg via INTRAVENOUS
  Filled 2020-05-02: qty 510

## 2020-05-02 MED ORDER — FAMOTIDINE IN NACL 20-0.9 MG/50ML-% IV SOLN: 20.0000 mg | Freq: Once | INTRAVENOUS | Status: DC

## 2020-05-02 MED ORDER — FAMOTIDINE 20 MG PO TABS
20.0000 mg | ORAL_TABLET | Freq: Once | ORAL | Status: AC
  Administered 2020-05-02: 20 mg via ORAL

## 2020-05-02 MED ORDER — SODIUM CHLORIDE 0.9 % IV SOLN: Freq: Once | INTRAVENOUS | Status: AC

## 2020-05-02 MED ORDER — ACETAMINOPHEN 325 MG PO TABS
650.0000 mg | ORAL_TABLET | Freq: Once | ORAL | Status: AC
  Administered 2020-05-02: 650 mg via ORAL

## 2020-05-02 MED ORDER — FAMOTIDINE 20 MG PO TABS
ORAL_TABLET | ORAL | Status: AC
  Filled 2020-05-02: qty 1

## 2020-05-02 NOTE — Patient Instructions (Signed)
[facility] at [facility]  Discharge Instructions:   _______________________________________________________________  Thank you for choosing [facility] at [facility] to provide your oncology and hematology care.  To afford each patient quality time with our providers, please arrive at least 15 minutes before your scheduled appointment.  You need to re-schedule your appointment if you arrive 10 or more minutes late.  We strive to give you quality time with our providers, and arriving late affects you and other patients whose appointments are after yours.  Also, if you no show three or more times for appointments you may be dismissed from the clinic.  Again, thank you for choosing [facility] at [facility]. Our hope is that these requests will allow you access to exceptional care and in a timely manner. _______________________________________________________________  If you have questions after your visit, please contact our office at [phone] between the hours of 8:30 a.m. and 5:00 p.m. Voicemails left after 4:30 p.m. will not be returned until the following business day. _______________________________________________________________  For prescription refill requests, have your pharmacy contact our office. _______________________________________________________________  Recommendations made by the consultant and any test results will be sent to your referring physician. _______________________________________________________________ 

## 2020-05-02 NOTE — Progress Notes (Signed)
Patient presents today for Feraheme infusion. Vital signs are stable. Patient has no complaints of any pain. Patient denies any changes since her last visit.   Feraheme given today per MD orders. Tolerated infusion without adverse affects. Vital signs stable. No complaints at this time. Discharged from clinic ambulatory. F/U with [facility] as scheduled.

## 2020-05-06 ENCOUNTER — Encounter (HOSPITAL_COMMUNITY): Payer: Medicare Other

## 2020-05-09 ENCOUNTER — Inpatient Hospital Stay (HOSPITAL_COMMUNITY): Payer: Medicare Other

## 2020-05-09 ENCOUNTER — Other Ambulatory Visit: Payer: Self-pay

## 2020-05-09 ENCOUNTER — Encounter (HOSPITAL_COMMUNITY): Payer: Self-pay

## 2020-05-09 VITALS — BP 149/55 | HR 72 | Temp 97.3°F | Resp 18

## 2020-05-09 DIAGNOSIS — K5730 Diverticulosis of large intestine without perforation or abscess without bleeding: Secondary | ICD-10-CM | POA: Diagnosis not present

## 2020-05-09 DIAGNOSIS — Z803 Family history of malignant neoplasm of breast: Secondary | ICD-10-CM | POA: Diagnosis not present

## 2020-05-09 DIAGNOSIS — E785 Hyperlipidemia, unspecified: Secondary | ICD-10-CM | POA: Diagnosis not present

## 2020-05-09 DIAGNOSIS — N189 Chronic kidney disease, unspecified: Secondary | ICD-10-CM | POA: Diagnosis not present

## 2020-05-09 DIAGNOSIS — Z8249 Family history of ischemic heart disease and other diseases of the circulatory system: Secondary | ICD-10-CM | POA: Diagnosis not present

## 2020-05-09 DIAGNOSIS — D509 Iron deficiency anemia, unspecified: Secondary | ICD-10-CM

## 2020-05-09 DIAGNOSIS — Z823 Family history of stroke: Secondary | ICD-10-CM | POA: Diagnosis not present

## 2020-05-09 DIAGNOSIS — I10 Essential (primary) hypertension: Secondary | ICD-10-CM | POA: Diagnosis not present

## 2020-05-09 DIAGNOSIS — I252 Old myocardial infarction: Secondary | ICD-10-CM | POA: Diagnosis not present

## 2020-05-09 DIAGNOSIS — Z800 Family history of malignant neoplasm of digestive organs: Secondary | ICD-10-CM | POA: Diagnosis not present

## 2020-05-09 DIAGNOSIS — Z79899 Other long term (current) drug therapy: Secondary | ICD-10-CM | POA: Diagnosis not present

## 2020-05-09 DIAGNOSIS — K921 Melena: Secondary | ICD-10-CM | POA: Diagnosis not present

## 2020-05-09 DIAGNOSIS — G4733 Obstructive sleep apnea (adult) (pediatric): Secondary | ICD-10-CM | POA: Diagnosis not present

## 2020-05-09 DIAGNOSIS — Z8679 Personal history of other diseases of the circulatory system: Secondary | ICD-10-CM | POA: Diagnosis not present

## 2020-05-09 MED ORDER — SODIUM CHLORIDE 0.9 % IV SOLN
510.0000 mg | Freq: Once | INTRAVENOUS | Status: AC
  Administered 2020-05-09: 510 mg via INTRAVENOUS
  Filled 2020-05-09: qty 510

## 2020-05-09 MED ORDER — SODIUM CHLORIDE 0.9 % IV SOLN: Freq: Once | INTRAVENOUS | Status: AC

## 2020-05-09 NOTE — Progress Notes (Signed)
Patient presents today for Feraheme infusion. MAR reviewed and updated. Vital signs stable. Patient has no complaints of any changes since her last visit.   Treatment given today per MD orders. Tolerated infusion without adverse affects. Vital signs stable. No complaints at this time. Discharged from clinic ambulatory in stable condition. Alert and oriented x 3. F/U with  Cancer Center as scheduled.  

## 2020-05-09 NOTE — Patient Instructions (Signed)
[facility] Discharge Instructions for Patients Receiving Chemotherapy  Today you received the following chemotherapy agents   To help prevent nausea and vomiting after your treatment, we encourage you to take your nausea medication   If you develop nausea and vomiting that is not controlled by your nausea medication, call the clinic.   BELOW ARE SYMPTOMS THAT SHOULD BE REPORTED IMMEDIATELY:  *FEVER GREATER THAN 100.5 F  *CHILLS WITH OR WITHOUT FEVER  NAUSEA AND VOMITING THAT IS NOT CONTROLLED WITH YOUR NAUSEA MEDICATION  *UNUSUAL SHORTNESS OF BREATH  *UNUSUAL BRUISING OR BLEEDING  TENDERNESS IN MOUTH AND THROAT WITH OR WITHOUT PRESENCE OF ULCERS  *URINARY PROBLEMS  *BOWEL PROBLEMS  UNUSUAL RASH Items with * indicate a potential emergency and should be followed up as soon as possible.  Feel free to call the clinic should you have any questions or concerns. The clinic phone number is [phone].  Please show the CHEMO ALERT CARD at check-in to the Emergency Department and triage nurse.   

## 2020-05-15 ENCOUNTER — Ambulatory Visit (HOSPITAL_COMMUNITY): Payer: Medicare Other | Admitting: Hematology

## 2020-06-05 ENCOUNTER — Other Ambulatory Visit (HOSPITAL_COMMUNITY): Payer: Self-pay

## 2020-06-05 DIAGNOSIS — D509 Iron deficiency anemia, unspecified: Secondary | ICD-10-CM

## 2020-06-06 ENCOUNTER — Inpatient Hospital Stay (HOSPITAL_COMMUNITY): Payer: Medicare Other | Attending: Hematology

## 2020-06-06 ENCOUNTER — Other Ambulatory Visit: Payer: Self-pay

## 2020-06-06 DIAGNOSIS — E782 Mixed hyperlipidemia: Secondary | ICD-10-CM | POA: Insufficient documentation

## 2020-06-06 DIAGNOSIS — I252 Old myocardial infarction: Secondary | ICD-10-CM | POA: Diagnosis not present

## 2020-06-06 DIAGNOSIS — Z800 Family history of malignant neoplasm of digestive organs: Secondary | ICD-10-CM | POA: Insufficient documentation

## 2020-06-06 DIAGNOSIS — Z8616 Personal history of COVID-19: Secondary | ICD-10-CM | POA: Insufficient documentation

## 2020-06-06 DIAGNOSIS — G479 Sleep disorder, unspecified: Secondary | ICD-10-CM | POA: Diagnosis not present

## 2020-06-06 DIAGNOSIS — Z823 Family history of stroke: Secondary | ICD-10-CM | POA: Insufficient documentation

## 2020-06-06 DIAGNOSIS — Z803 Family history of malignant neoplasm of breast: Secondary | ICD-10-CM | POA: Insufficient documentation

## 2020-06-06 DIAGNOSIS — I10 Essential (primary) hypertension: Secondary | ICD-10-CM | POA: Insufficient documentation

## 2020-06-06 DIAGNOSIS — D509 Iron deficiency anemia, unspecified: Secondary | ICD-10-CM

## 2020-06-06 DIAGNOSIS — R5383 Other fatigue: Secondary | ICD-10-CM | POA: Diagnosis not present

## 2020-06-06 DIAGNOSIS — Z79899 Other long term (current) drug therapy: Secondary | ICD-10-CM | POA: Diagnosis not present

## 2020-06-06 DIAGNOSIS — Z8249 Family history of ischemic heart disease and other diseases of the circulatory system: Secondary | ICD-10-CM | POA: Insufficient documentation

## 2020-06-06 DIAGNOSIS — Z87891 Personal history of nicotine dependence: Secondary | ICD-10-CM | POA: Insufficient documentation

## 2020-06-06 DIAGNOSIS — K5730 Diverticulosis of large intestine without perforation or abscess without bleeding: Secondary | ICD-10-CM | POA: Insufficient documentation

## 2020-06-06 DIAGNOSIS — R519 Headache, unspecified: Secondary | ICD-10-CM | POA: Diagnosis not present

## 2020-06-06 DIAGNOSIS — R42 Dizziness and giddiness: Secondary | ICD-10-CM | POA: Diagnosis not present

## 2020-06-06 DIAGNOSIS — K264 Chronic or unspecified duodenal ulcer with hemorrhage: Secondary | ICD-10-CM | POA: Insufficient documentation

## 2020-06-06 LAB — CBC WITH DIFFERENTIAL/PLATELET
Abs Immature Granulocytes: 0.01 10*3/uL (ref 0.00–0.07)
Basophils Absolute: 0.1 10*3/uL (ref 0.0–0.1)
Basophils Relative: 1 %
Eosinophils Absolute: 0.2 10*3/uL (ref 0.0–0.5)
Eosinophils Relative: 4 %
HCT: 41.4 % (ref 36.0–46.0)
Hemoglobin: 12.9 g/dL (ref 12.0–15.0)
Immature Granulocytes: 0 %
Lymphocytes Relative: 38 %
Lymphs Abs: 1.6 10*3/uL (ref 0.7–4.0)
MCH: 23.7 pg — ABNORMAL LOW (ref 26.0–34.0)
MCHC: 31.2 g/dL (ref 30.0–36.0)
MCV: 76 fL — ABNORMAL LOW (ref 80.0–100.0)
Monocytes Absolute: 0.5 10*3/uL (ref 0.1–1.0)
Monocytes Relative: 12 %
Neutro Abs: 2 10*3/uL (ref 1.7–7.7)
Neutrophils Relative %: 45 %
Platelets: 240 10*3/uL (ref 150–400)
RBC: 5.45 MIL/uL — ABNORMAL HIGH (ref 3.87–5.11)
RDW: 25.6 % — ABNORMAL HIGH (ref 11.5–15.5)
WBC: 4.3 10*3/uL (ref 4.0–10.5)
nRBC: 0 % (ref 0.0–0.2)

## 2020-06-06 LAB — VITAMIN B12: Vitamin B-12: 328 pg/mL (ref 180–914)

## 2020-06-06 LAB — IRON AND TIBC
Iron: 87 ug/dL (ref 28–170)
Saturation Ratios: 25 % (ref 10.4–31.8)
TIBC: 342 ug/dL (ref 250–450)
UIBC: 255 ug/dL

## 2020-06-06 LAB — LACTATE DEHYDROGENASE: LDH: 167 U/L (ref 98–192)

## 2020-06-06 LAB — FOLATE: Folate: 32.2 ng/mL (ref >5.9)

## 2020-06-06 LAB — FERRITIN: Ferritin: 390 ng/mL — ABNORMAL HIGH (ref 11–307)

## 2020-06-09 ENCOUNTER — Other Ambulatory Visit: Payer: Self-pay

## 2020-06-09 ENCOUNTER — Inpatient Hospital Stay (HOSPITAL_BASED_OUTPATIENT_CLINIC_OR_DEPARTMENT_OTHER): Payer: Medicare Other | Admitting: Hematology

## 2020-06-09 VITALS — BP 142/75 | HR 80 | Temp 98.3°F | Resp 16 | Wt 158.8 lb

## 2020-06-09 DIAGNOSIS — G479 Sleep disorder, unspecified: Secondary | ICD-10-CM | POA: Diagnosis not present

## 2020-06-09 DIAGNOSIS — Z79899 Other long term (current) drug therapy: Secondary | ICD-10-CM | POA: Diagnosis not present

## 2020-06-09 DIAGNOSIS — K264 Chronic or unspecified duodenal ulcer with hemorrhage: Secondary | ICD-10-CM | POA: Diagnosis not present

## 2020-06-09 DIAGNOSIS — D509 Iron deficiency anemia, unspecified: Secondary | ICD-10-CM

## 2020-06-09 DIAGNOSIS — E782 Mixed hyperlipidemia: Secondary | ICD-10-CM | POA: Diagnosis not present

## 2020-06-09 DIAGNOSIS — K5730 Diverticulosis of large intestine without perforation or abscess without bleeding: Secondary | ICD-10-CM | POA: Diagnosis not present

## 2020-06-09 DIAGNOSIS — R42 Dizziness and giddiness: Secondary | ICD-10-CM | POA: Diagnosis not present

## 2020-06-09 DIAGNOSIS — Z8249 Family history of ischemic heart disease and other diseases of the circulatory system: Secondary | ICD-10-CM | POA: Diagnosis not present

## 2020-06-09 DIAGNOSIS — Z87891 Personal history of nicotine dependence: Secondary | ICD-10-CM | POA: Diagnosis not present

## 2020-06-09 DIAGNOSIS — I252 Old myocardial infarction: Secondary | ICD-10-CM | POA: Diagnosis not present

## 2020-06-09 DIAGNOSIS — R5383 Other fatigue: Secondary | ICD-10-CM | POA: Diagnosis not present

## 2020-06-09 DIAGNOSIS — Z803 Family history of malignant neoplasm of breast: Secondary | ICD-10-CM | POA: Diagnosis not present

## 2020-06-09 DIAGNOSIS — I10 Essential (primary) hypertension: Secondary | ICD-10-CM | POA: Diagnosis not present

## 2020-06-09 DIAGNOSIS — Z800 Family history of malignant neoplasm of digestive organs: Secondary | ICD-10-CM | POA: Diagnosis not present

## 2020-06-09 DIAGNOSIS — Z823 Family history of stroke: Secondary | ICD-10-CM | POA: Diagnosis not present

## 2020-06-09 DIAGNOSIS — R519 Headache, unspecified: Secondary | ICD-10-CM | POA: Diagnosis not present

## 2020-06-09 LAB — PROTEIN ELECTROPHORESIS, SERUM
A/G Ratio: 1.2 (ref 0.7–1.7)
Albumin ELP: 3.8 g/dL (ref 2.9–4.4)
Alpha-1-Globulin: 0.2 g/dL (ref 0.0–0.4)
Alpha-2-Globulin: 0.9 g/dL (ref 0.4–1.0)
Beta Globulin: 0.9 g/dL (ref 0.7–1.3)
Gamma Globulin: 1.2 g/dL (ref 0.4–1.8)
Globulin, Total: 3.3 g/dL (ref 2.2–3.9)
Total Protein ELP: 7.1 g/dL (ref 6.0–8.5)

## 2020-06-09 NOTE — Patient Instructions (Signed)
[facility] at [facility] Discharge Instructions  You were seen today by Dr. Katragadda. He went over your recent results. Dr. Katragadda will see you back in 3 months for labs and follow up.   Thank you for choosing [facility] at [facility] to provide your oncology and hematology care.  To afford each patient quality time with our provider, please arrive at least 15 minutes before your scheduled appointment time.   If you have a lab appointment with the Cancer Center please come in thru the Main Entrance and check in at the main information desk  You need to re-schedule your appointment should you arrive 10 or more minutes late.  We strive to give you quality time with our providers, and arriving late affects you and other patients whose appointments are after yours.  Also, if you no show three or more times for appointments you may be dismissed from the clinic at the providers discretion.     Again, thank you for choosing [facility].  Our hope is that these requests will decrease the amount of time that you wait before being seen by our physicians.       _____________________________________________________________  Should you have questions after your visit to  Cancer Center, please contact our office at [phone] between the hours of 8:00 a.m. and 4:30 p.m.  Voicemails left after 4:00 p.m. will not be returned until the following business day.  For prescription refill requests, have your pharmacy contact our office and allow 72 hours.    Cancer Center Support Programs:   > Cancer Support Group  2nd Tuesday of the month 1pm-2pm, Journey Room   

## 2020-06-09 NOTE — Progress Notes (Signed)
[address] [place], [place] [address]   CLINIC:  Medical Oncology/Hematology  PCP:  [name], NP [place] / [place] [address]  [phone]  REASON FOR VISIT:  Follow-up for microcytic anemia  PRIOR THERAPY: Intermittent Feraheme last on 9/10 and 9/17  CURRENT THERAPY: Iron tablet daily  INTERVAL HISTORY:  Nicole Horn, a 74 y.o. female, returns for routine follow-up for her microcytic anemia. Nicole Horn was last seen on 04/24/2020.  Today she reports feeling good. Her energy level improved after she received the iron in mid-September. She takes 1 iron tablet daily and denies having constipation or CP, though she reports having headaches in the morning.  She has already received her flu shot.   REVIEW OF SYSTEMS:  Review of Systems  Constitutional: Positive for appetite change (75%) and fatigue (50%).  Cardiovascular: Positive for palpitations. Negative for chest pain.  Gastrointestinal: Negative for constipation.  Genitourinary: Positive for frequency.   Neurological: Positive for headaches (in AM) and numbness (R side).  Psychiatric/Behavioral: Positive for sleep disturbance.  All other systems reviewed and are negative.   PAST MEDICAL/SURGICAL HISTORY:  Past Medical History:  Diagnosis Date  . 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 Chronic thumb pain, right 11/13/2019  . Cough 11/13/2019  . Dizziness 03/13/2020  . Essential hypertension   . Exposure to sexually transmitted disease (STD) 12/11/2019  . Heart attack [facility]) 2004   [place] - hospitalized with stress test by report and presumably medical therapy; subsequent cardiac catheterization (no PCI)  . History of COVID-19 08/2019  . History of irregular heartbeat 12/11/2019  . History of myocardial infarct at age less than 65 years 12/11/2019  . History of syphilis 12/11/2019  . Mixed hyperlipidemia   . PAD (peripheral artery disease) ([place]) 2019    PTCA/stent [place]  . Pneumonia due to COVID-19 virus 09/10/2019  . Sleep apnea    Pt supposed to wear CPAP, but doesn't.  . Type 2 diabetes mellitus ([place])    Past Surgical History:  Procedure Laterality Date  . ABDOMINAL HYSTERECTOMY    . BACK SURGERY    . BARTHOLIN GLAND CYST EXCISION Left 03/04/2015   Procedure: EXCISION OF LEFT BARTHOLINS TUMOR;  Surgeon: [name], MD;  Location: [facility] ORS;  Service: Gynecology;  Laterality: Left;  . CERVICAL SPINE SURGERY    . COLONOSCOPY N/A 02/01/2020   Procedure: COLONOSCOPY;  Surgeon: Daneil Dolin, MD; Scattered medium mouth diverticula in the sigmoid and descending colon, otherwise normal exam.  . CYST REMOVAL TRUNK    . ESOPHAGOGASTRODUODENOSCOPY (EGD) WITH PROPOFOL N/A 03/27/2020   Procedure: ESOPHAGOGASTRODUODENOSCOPY (EGD) WITH PROPOFOL;  Surgeon: Daneil Dolin, MD;  Location: [facility];  Service: Endoscopy;  Laterality: N/A;  1:00pm  . GIVENS CAPSULE STUDY N/A 03/27/2020   Procedure: GIVENS CAPSULE STUDY;  Surgeon: Daneil Dolin, MD;  Location: [facility];  Service: Endoscopy;  Laterality: N/A;  . Multiple cyst removal surgeries    . Stent of lower extremities      SOCIAL HISTORY:  Social History   Socioeconomic History  . Marital status: Widowed    Spouse name: Not on file  . Number of children: 1  . Years of education: Not on file  . Highest education level: Some college, no degree  Occupational History  . Not on file  Tobacco Use  . Smoking status: Former Smoker    Packs/day: 2.00    Years: 25.00  Pack years: 50.00    Types: Cigarettes    Quit date: 03/10/1985    Years since quitting: 35.2  . Smokeless tobacco: Never Used  Vaping Use  . Vaping Use: Never used  Substance and Sexual Activity  . Alcohol use: Yes    Alcohol/week: 0.0 standard drinks    Comment: socially  . Drug use: No  . Sexual activity: Not Currently    Birth control/protection: Surgical  Other Topics Concern  . Not on file  Social  History Narrative   Lives in [place] and [place] 6 months here and there owns an apt in [place] alone, but helps to care for mother who is 20 years (goes between the kids)   Sister is in [place] is in [place]      Son who is 28 years old lives in [place], [place]    Has 6 grand kids and 7 great grands      Enjoys exercise (harder since COVID)-dancing      Diet: eats all foods-enjoys smoothies, avoids fried foods, limited red meat if any   Caffeine: coffee 1 cup in the morning-sometimes will have 1 in evening    Water: 3-4 bottles daily       Wears seat belt   Smoke detectors in home   Does not use phone while driving            Social Determinants of Health   Financial Resource Strain: Low Risk   . Difficulty of Paying Living Expenses: Not very hard  Food Insecurity: No Food Insecurity  . Worried About Charity fundraiser in the Last Year: Never true  . Ran Out of Food in the Last Year: Never true  Transportation Needs: No Transportation Needs  . Lack of Transportation (Medical): No  . Lack of Transportation (Non-Medical): No  Physical Activity: Inactive  . Days of Exercise per Week: 0 days  . Minutes of Exercise per Session: 0 min  Stress: No Stress Concern Present  . Feeling of Stress : Not at all  Social Connections: Socially Isolated  . Frequency of Communication with Friends and Family: More than three times a week  . Frequency of Social Gatherings with Friends and Family: More than three times a week  . Attends Religious Services: Never  . Active Member of Clubs or Organizations: No  . Attends Archivist Meetings: Never  . Marital Status: Widowed  Intimate Partner Violence: Not At Risk  . Fear of Current or Ex-Partner: No  . Emotionally Abused: No  . Physically Abused: No  . Sexually Abused: No    FAMILY HISTORY:  Family History  Problem Relation Age of Onset  . Colon cancer Mother        In her 49s  . Breast cancer Sister    . Stomach cancer Maternal Aunt   . Healthy Brother   . Stroke Paternal Grandmother   . Heart attack Paternal Grandmother     CURRENT MEDICATIONS:  Current Outpatient Medications  Medication Sig Dispense Refill  . albuterol (VENTOLIN HFA) 108 (90 Base) MCG/ACT inhaler Inhale 2 puffs into the lungs every 4 (four) hours as needed for wheezing or shortness of breath.    Marland Kitchen amLODipine (NORVASC) 10 MG tablet Take 1 tablet (10 mg total) by mouth daily. 90 tablet 1  . ASPIRIN 81 PO Take 81 mg by mouth daily.    . Multiple Vitamin (MULTIVITAMIN WITH MINERALS) TABS tablet Take 1  tablet by mouth daily.    Marland Kitchen olmesartan-hydrochlorothiazide (BENICAR HCT) 20-12.5 MG tablet Take 1 tablet by mouth daily. 90 tablet 0  . rosuvastatin (CRESTOR) 40 MG tablet Take 1 tablet (40 mg total) by mouth daily. 90 tablet 0   No current facility-administered medications for this visit.    ALLERGIES:  No Known Allergies  PHYSICAL EXAM:  Performance status (ECOG): 1 - Symptomatic but completely ambulatory  Vitals:   06/09/20 1616  BP: (!) 142/75  Pulse: 80  Resp: 16  Temp: 98.3 F (36.8 C)  SpO2: 97%   Wt Readings from Last 3 Encounters:  06/09/20 158 lb 12.8 oz (72 kg)  04/29/20 152 lb (68.9 kg)  04/24/20 150 lb 5.7 oz (68.2 kg)   Physical Exam Vitals reviewed.  Constitutional:      Appearance: Normal appearance.  Cardiovascular:     Rate and Rhythm: Normal rate and regular rhythm.     Pulses: Normal pulses.     Heart sounds: Normal heart sounds.  Pulmonary:     Effort: Pulmonary effort is normal.     Breath sounds: Normal breath sounds.  Neurological:     General: No focal deficit present.     Mental Status: She is alert and oriented to person, place, and time.  Psychiatric:        Mood and Affect: Mood normal.        Behavior: Behavior normal.     LABORATORY DATA:  I have reviewed the labs as listed.  CBC Latest Ref Rng & Units 06/06/2020 04/24/2020 04/16/2020  WBC 4.0 - 10.5 K/uL 4.3  6.4 6.1  Hemoglobin 12.0 - 15.0 g/dL 12.9 10.6(L) 10.2(L)  Hematocrit 36 - 46 % 41.4 37.0 35.6  Platelets 150 - 400 K/uL 240 321 341   CMP Latest Ref Rng & Units 04/24/2020 04/16/2020 03/15/2020  Glucose 70 - 99 mg/dL 81 94 98  BUN 8 - 23 mg/dL 15 14 17   Creatinine 0.44 - 1.00 mg/dL 1.05(H) 1.05(H) 1.16(H)  Sodium 135 - 145 mmol/L 142 143 144  Potassium 3.5 - 5.1 mmol/L 3.6 4.1 3.7  Chloride 98 - 111 mmol/L 107 105 111  CO2 22 - 32 mmol/L 26 25 26   Calcium 8.9 - 10.3 mg/dL 9.8 9.9 8.8(L)  Total Protein 6.5 - 8.1 g/dL 7.9 7.2 -  Total Bilirubin 0.3 - 1.2 mg/dL 0.4 0.3 -  Alkaline Phos 38 - 126 U/L 70 85 -  AST 15 - 41 U/L 28 25 -  ALT 0 - 44 U/L 26 22 -      Component Value Date/Time   RBC 5.45 (H) 06/06/2020 1316   MCV 76.0 (L) 06/06/2020 1316   MCV 70 (L) 04/16/2020 1141   MCH 23.7 (L) 06/06/2020 1316   MCHC 31.2 06/06/2020 1316   RDW 25.6 (H) 06/06/2020 1316   RDW 24.9 (H) 04/16/2020 1141   LYMPHSABS 1.6 06/06/2020 1316   MONOABS 0.5 06/06/2020 1316   EOSABS 0.2 06/06/2020 1316   BASOSABS 0.1 06/06/2020 1316    DIAGNOSTIC IMAGING:  I have independently reviewed the scans and discussed with the patient. No results found.   ASSESSMENT:  1.  Microcytic anemia: -CBC on 04/16/2020 shows hemoglobin 10.2 with MCV of 70. -Recent drop in hemoglobin to 6 and MCV to 64.7 on 03/14/2020, status post 2 units PRBC transfusion.  Ferritin was 3 with normal folic acid and B35. -Colonoscopy on 02/01/2020 shows diverticulosis in the sigmoid colon and the descending colon. -EGD on 03/27/2020 shows normal  esophagus, normal stomach, 2 small duodenal erosions. -CTAP on 03/15/2020 shows normal liver and normal-sized spleen. -She was taking iron tablet daily for 4 months prior to hospitalization and transfusion. -We will check for nutritional deficiencies which were negative. -SPEP was negative. -Feraheme on 05/02/2020 and 05/09/2020.  2.  Family history: -Sister had breast cancer.  Mother had colon  cancer.  Maternal aunt had stomach cancer.   PLAN:  1.  Microcytic anemia: -She felt better after the iron infusions. -She denies any bleeding per rectum but has black stools. -Hemoglobin is 12.9 on labs from 06/06/2020.  Ferritin is 390.  T34 and folic acid was normal.  LDH was normal. -She will continue iron twice daily. -We will see her back in 3 months for follow-up with repeat labs.  2.  Health maintenance: -Mammogram on 05/01/2020 was negative.   Orders placed this encounter:  No orders of the defined types were placed in this encounter.    Derek Jack, MD [place] [phone]   I, Milinda Antis, am acting as a scribe for Dr. Sanda Linger.  I, Derek Jack MD, have reviewed the above documentation for accuracy and completeness, and I agree with the above.

## 2020-06-13 LAB — COPPER, SERUM: Copper: 66 ug/dL — ABNORMAL LOW (ref 80–158)

## 2020-06-15 ENCOUNTER — Other Ambulatory Visit: Payer: Self-pay | Admitting: Internal Medicine

## 2020-07-03 ENCOUNTER — Encounter: Payer: Self-pay | Admitting: Orthopedic Surgery

## 2020-07-03 ENCOUNTER — Other Ambulatory Visit: Payer: Self-pay

## 2020-07-03 ENCOUNTER — Ambulatory Visit (INDEPENDENT_AMBULATORY_CARE_PROVIDER_SITE_OTHER): Payer: Medicare Other | Admitting: Orthopedic Surgery

## 2020-07-03 VITALS — BP 113/73 | HR 75 | Ht 60.0 in | Wt 150.0 lb

## 2020-07-03 DIAGNOSIS — M19041 Primary osteoarthritis, right hand: Secondary | ICD-10-CM | POA: Diagnosis not present

## 2020-07-03 DIAGNOSIS — M19049 Primary osteoarthritis, unspecified hand: Secondary | ICD-10-CM

## 2020-07-03 LAB — SAVE SMEAR(SSMR), FOR PROVIDER SLIDE REVIEW

## 2020-07-03 MED ORDER — MELOXICAM 7.5 MG PO TABS: 7.5000 mg | ORAL_TABLET | Freq: Every day | ORAL | 5 refills | Status: DC

## 2020-07-03 NOTE — Addendum Note (Signed)
Addended byCandice Camp on: 07/03/2020 04:32 PM   Modules accepted: Orders

## 2020-07-03 NOTE — Progress Notes (Signed)
Encounter Diagnosis  Name Primary?  . CMC arthritis right thumb Yes    Chief Complaint  Patient presents with  . Hand Pain    Right thumb     74 yo female h/o OA right thumb  2 injections 02/12/20 12/07/19  Comes in today with worsening pain and swelling right thumb  Discussed options  Brace nsaids Injection Consult hand   We decided to do all of the above  When we examined her we found that she had swelling of the left thumb at the [facility] joint with tenderness and poor opposition poor pinch positive grind test  Past Medical History:  Diagnosis Date  . 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 Chronic thumb pain, right 11/13/2019  . Cough 11/13/2019  . Dizziness 03/13/2020  . Essential hypertension   . Exposure to sexually transmitted disease (STD) 12/11/2019  . Heart attack [facility]) 2004   [place] - hospitalized with stress test by report and presumably medical therapy; subsequent cardiac catheterization (no PCI)  . History of COVID-19 08/2019  . History of irregular heartbeat 12/11/2019  . History of myocardial infarct at age less than 14 years 12/11/2019  . History of syphilis 12/11/2019  . Mixed hyperlipidemia   . PAD (peripheral artery disease) ([place]) 2019   PTCA/stent [place]  . Pneumonia due to COVID-19 virus 09/10/2019  . Sleep apnea    Pt supposed to wear CPAP, but doesn't.  . Type 2 diabetes mellitus ([facility])    No Known Allergies  Current Outpatient Medications:  .  albuterol (VENTOLIN HFA) 108 (90 Base) MCG/ACT inhaler, Inhale 2 puffs into the lungs every 4 (four) hours as needed for wheezing or shortness of breath., Disp: , Rfl:  .  amLODipine (NORVASC) 10 MG tablet, Take 1 tablet (10 mg total) by mouth daily., Disp: 90 tablet, Rfl: 1 .  ASPIRIN 81 PO, Take 81 mg by mouth daily., Disp: , Rfl:  .  Multiple Vitamin (MULTIVITAMIN WITH MINERALS) TABS tablet, Take 1 tablet by mouth daily., Disp: , Rfl:  .  olmesartan-hydrochlorothiazide (BENICAR HCT) 20-12.5 MG  tablet, TAKE 1 TABLET BY MOUTH  DAILY, Disp: 90 tablet, Rfl: 2 .  rosuvastatin (CRESTOR) 40 MG tablet, Take 1 tablet (40 mg total) by mouth daily., Disp: 90 tablet, Rfl: 0 .  meloxicam (MOBIC) 7.5 MG tablet, Take 1 tablet (7.5 mg total) by mouth daily., Disp: 30 tablet, Rfl: 5  Meds ordered this encounter  Medications  . meloxicam (MOBIC) 7.5 MG tablet    Sig: Take 1 tablet (7.5 mg total) by mouth daily.    Dispense:  30 tablet    Refill:  5   Procedure note injection right thumb  Injection right CMC joint Medication Depo-Medrol 40 mg and lidocaine 1% 2 cc The patient gave verbal consent Timeout confirmed the site of injection right CMC joint  Alcohol and ethyl chloride was used to repair the skin and then a 25-gauge needle was used to inject the [facility] joint of the right thumb  There were no complications a sterile Band-Aid was applied

## 2020-07-16 DIAGNOSIS — M1811 Unilateral primary osteoarthritis of first carpometacarpal joint, right hand: Secondary | ICD-10-CM | POA: Insufficient documentation

## 2020-07-16 DIAGNOSIS — M79644 Pain in right finger(s): Secondary | ICD-10-CM | POA: Diagnosis not present

## 2020-07-23 ENCOUNTER — Other Ambulatory Visit: Payer: Self-pay

## 2020-07-23 ENCOUNTER — Ambulatory Visit (INDEPENDENT_AMBULATORY_CARE_PROVIDER_SITE_OTHER): Payer: Medicare Other | Admitting: Family Medicine

## 2020-07-23 ENCOUNTER — Encounter: Payer: Self-pay | Admitting: Family Medicine

## 2020-07-23 VITALS — BP 142/76 | HR 70 | Temp 98.7°F | Ht 60.0 in | Wt 160.0 lb

## 2020-07-23 DIAGNOSIS — Z638 Other specified problems related to primary support group: Secondary | ICD-10-CM | POA: Diagnosis not present

## 2020-07-23 DIAGNOSIS — I10 Essential (primary) hypertension: Secondary | ICD-10-CM | POA: Diagnosis not present

## 2020-07-23 NOTE — Progress Notes (Signed)
Subjective:  Patient ID: Nicole Horn, female    DOB: April 09, 1946  Age: 74 y.o. MRN: [id number]  CC:  Chief Complaint  Patient presents with  . Follow-up    concerned about memory, forgetfullness x2-3 months      HPI  HPI Nicole Horn is a 74 year old female patient who presents today secondary to having changes in her memory.  She reports that not long after she saw me at her last appointment she started having increased forgetfulness.  She reports that she has left her water running at times.  Has not left any burners on.  She does misplace things if they are not kept into their pattern areas such as her keys in a jar that she usually keeps them in.  She reports that sometimes she will get up to go out of her room and forget exactly why she was going somewhere when she was quantity.  Sometimes she will get up to go to the kitchen to fix herself something to eat but she forgets exactly what she wanted.  She denies getting lost when she is driving.  She does not forget to eat.  She does not forget to do hygiene items but she does sometimes feel like she does not want to do her hygiene.  She denies having any depression but is very frustrated as she is gotten behind on some bills.  Secondary to having to take care of her mother who has dementia.  In conversation it appears that she has been very stretched as she is the only sibling taking care of her mother and her mother has dementia and does not like to be told what to do.  She oversees all of her mother's care and bills.  And on top of taking care of her own bills and needs.  She is willing to have an MMSE today.  Today patient denies signs and symptoms of COVID 19 infection including fever, chills, cough, shortness of breath, and headache. Past Medical, Surgical, Social History, Allergies, and Medications have been Reviewed.   Past Medical History:  Diagnosis Date  . 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 Chronic  thumb pain, right 11/13/2019  . Cough 11/13/2019  . Dizziness 03/13/2020  . Essential hypertension   . Exposure to sexually transmitted disease (STD) 12/11/2019  . Heart attack [facility]) 2004   [place] - hospitalized with stress test by report and presumably medical therapy; subsequent cardiac catheterization (no PCI)  . History of COVID-19 08/2019  . History of irregular heartbeat 12/11/2019  . History of myocardial infarct at age less than 5 years 12/11/2019  . History of syphilis 12/11/2019  . Mixed hyperlipidemia   . PAD (peripheral artery disease) ([place]) 2019   PTCA/stent [place]  . Pneumonia due to COVID-19 virus 09/10/2019  . Sleep apnea    Pt supposed to wear CPAP, but doesn't.  . Type 2 diabetes mellitus ([place])     No outpatient medications have been marked as taking for the 07/23/20 encounter (Office Visit) with [name], NP.    ROS:  Review of Systems  Constitutional: Negative.   HENT: Negative.   Eyes: Negative.   Respiratory: Negative.   Cardiovascular: Negative.   Gastrointestinal: Negative.   Genitourinary: Negative.   Musculoskeletal: Negative.   Skin: Negative.   Neurological: Negative.   Endo/Heme/Allergies: Negative.   Psychiatric/Behavioral: Positive for memory loss.       High stress     Objective:  Today's Vitals: BP (!) 142/76 (BP Location: Right Arm, Patient Position: Sitting, Cuff Size: Normal)   Pulse 70   Temp 98.7 F (37.1 C) (Temporal)   Ht 5' (1.524 m)   Wt 160 lb (72.6 kg)   SpO2 96%   BMI 31.25 kg/m  Vitals with BMI 07/23/2020 07/03/2020 06/09/2020  Height 5\' 0"  5\' 0"  -  Weight 160 lbs 150 lbs 158 lbs 13 oz  BMI 27.51 70.0 -  Systolic 174 944 967  Diastolic 76 73 75  Pulse 70 75 80     Physical Exam Vitals and nursing note reviewed.  Constitutional:      Appearance: Normal appearance. She is well-developed and well-groomed. She is obese.  HENT:     Head: Normocephalic and atraumatic.     Right Ear: External ear normal.      Left Ear: External ear normal.     Mouth/Throat:     Comments: Mask in place  Eyes:     General:        Right eye: No discharge.        Left eye: No discharge.     Conjunctiva/sclera: Conjunctivae normal.  Cardiovascular:     Rate and Rhythm: Normal rate and regular rhythm.     Pulses: Normal pulses.     Heart sounds: Normal heart sounds.  Pulmonary:     Effort: Pulmonary effort is normal.     Breath sounds: Normal breath sounds.  Musculoskeletal:        General: Normal range of motion.     Cervical back: Normal range of motion and neck supple.  Skin:    General: Skin is warm.  Neurological:     General: No focal deficit present.     Mental Status: She is alert and oriented to person, place, and time.  Psychiatric:        Attention and Perception: Attention normal.        Mood and Affect: Mood normal.        Speech: Speech normal.        Behavior: Behavior normal. Behavior is cooperative.        Thought Content: Thought content normal.        Cognition and Memory: Cognition normal.        Judgment: Judgment normal.     Comments: MMSE 29/30     MMSE - Mini Mental State Exam 07/23/2020  Orientation to time 4  Orientation to Place 5  Registration 3  Attention/ Calculation 5  Recall 3  Language- name 2 objects 2  Language- repeat 1  Language- follow 3 step command 3  Language- read & follow direction 1  Write a sentence 1  Copy design 1  Total score 29     Assessment   1. Caregiver role strain   2. Essential hypertension     Tests ordered Orders Placed This Encounter  Procedures  . Ambulatory referral to Psychology     Plan: Please see assessment and plan per problem list above.   No orders of the defined types were placed in this encounter.   Patient to follow-up in 3 months .  Note: This dictation was prepared with Dragon dictation along with smaller phrase technology. Similar sounding words can be transcribed inadequately or may not be corrected  upon review. Any transcriptional errors that result from this process are unintentional.      [name], NP

## 2020-07-23 NOTE — Assessment & Plan Note (Signed)
Nicole Horn is encouraged to maintain a well balanced diet that is low in salt. Higher end of normal- most likely stress realted, continue current medication regimen.  Additionally, she is also reminded that exercise is beneficial for heart health and control of  Blood pressure. 30-60 minutes daily is recommended-walking was suggested.

## 2020-07-23 NOTE — Assessment & Plan Note (Signed)
Referral to therapy Encouraged to get a daily planner right her stuff and 1 color in her mom and another color to help keep up with things. Reach out to siblings to ask again if they can help. Look into adult daycare. Follow-up closely in 3 months sooner if needed Declined any medication and start at this time.

## 2020-07-23 NOTE — Patient Instructions (Signed)
  I appreciate the opportunity to provide you with care for your health and wellness. Today we discussed: stress and memory   Follow up: 12 weeks for stress follow up  No labs  Referrals today: [facility] for therapy due to role giver strain  Be patient with yourself. Get a planner and write your stuff in one color and your mom's in another. Consider an adult daycare.  Please continue to practice social distancing to keep you, your family, and our community safe.  If you must go out, please wear a mask and practice good handwashing.  It was a pleasure to see you and I look forward to continuing to work together on your health and well-being. Please do not hesitate to call the office if you need care or have questions about your care.  Have a wonderful day. With Gratitude, Cherly Beach, DNP, AGNP-BC

## 2020-07-25 IMAGING — CT CT ABD-PELV W/O CM
2 of 4 series · 17 of 46 positions shown, 19 images · non-contrast
Comparison: CT abdomen pelvis, 07/30/2014

CLINICAL DATA: Hematuria

EXAM:
CT ABDOMEN AND PELVIS WITHOUT CONTRAST
TECHNIQUE: Multidetector CT imaging of the abdomen and pelvis was performed
following the standard protocol without IV contrast. Oral enteric
contrast was administered.

[Series 3: axial st · axial · 0.77mm/px · z∈[+941,+1321]mm · 14 of 84 slices shown, 16 images]
[im 4/84  soft-tissue]
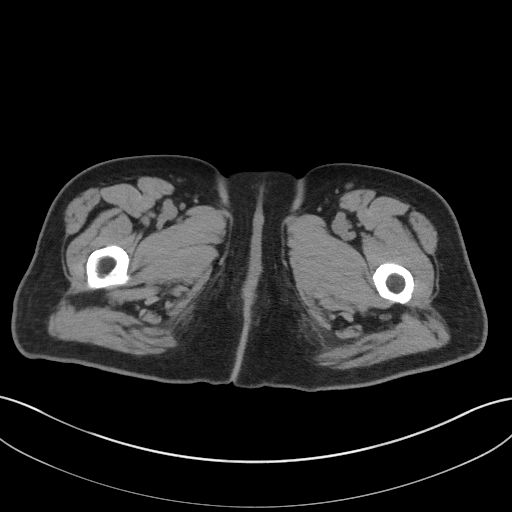
[im 4/84  bone]
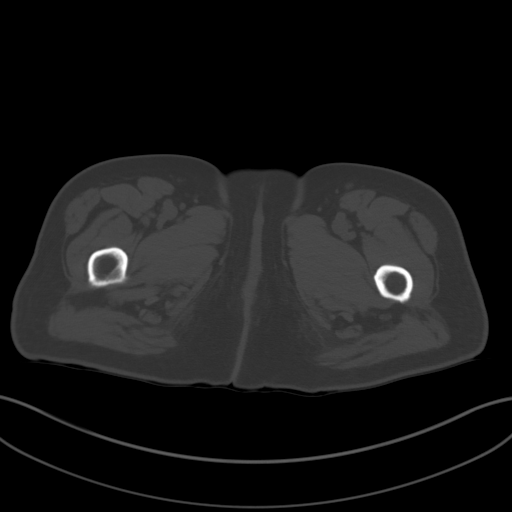
[im 12/84  soft-tissue]
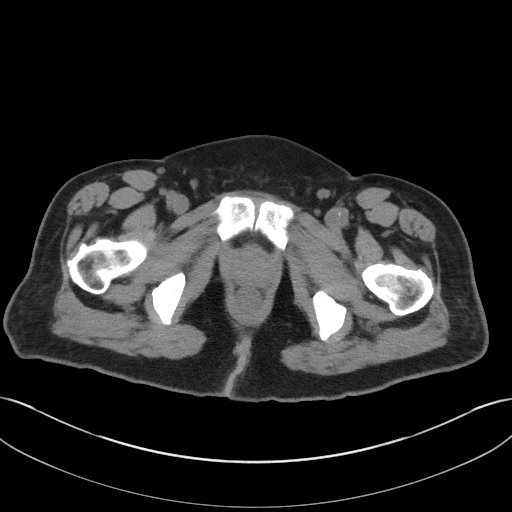
[im 16/84  soft-tissue]
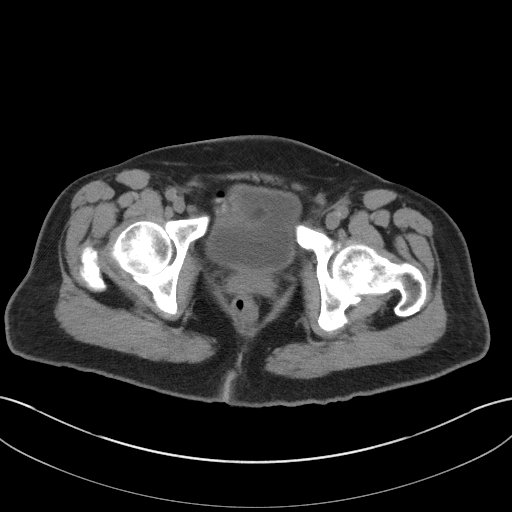
[im 24/84  soft-tissue]
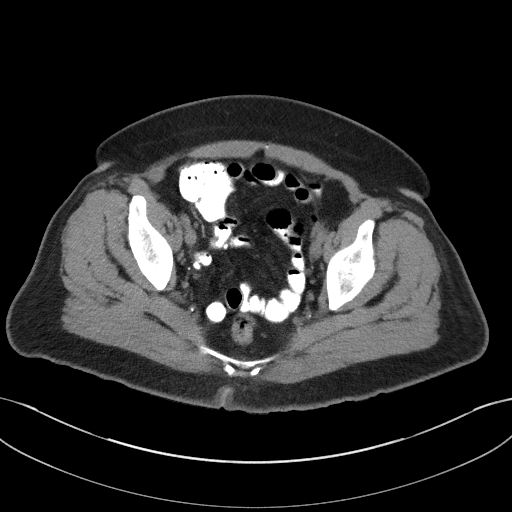
[im 28/84  soft-tissue]
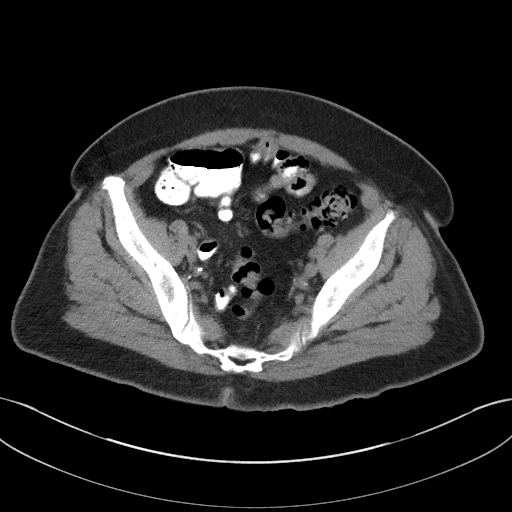
[im 32/84  soft-tissue]
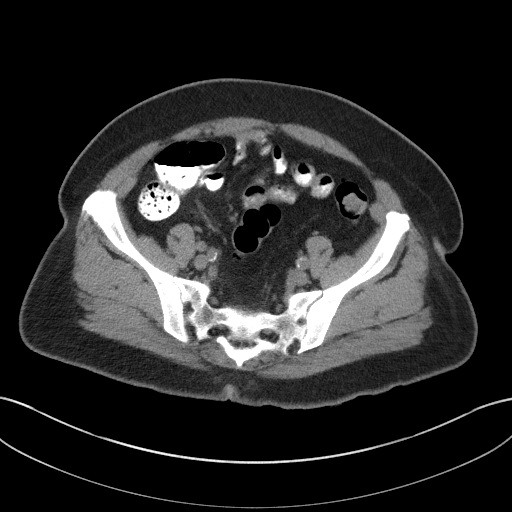
[im 40/84  soft-tissue]
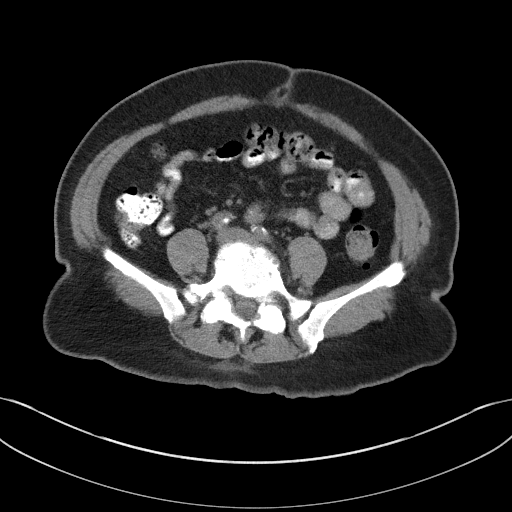
[im 44/84  soft-tissue]
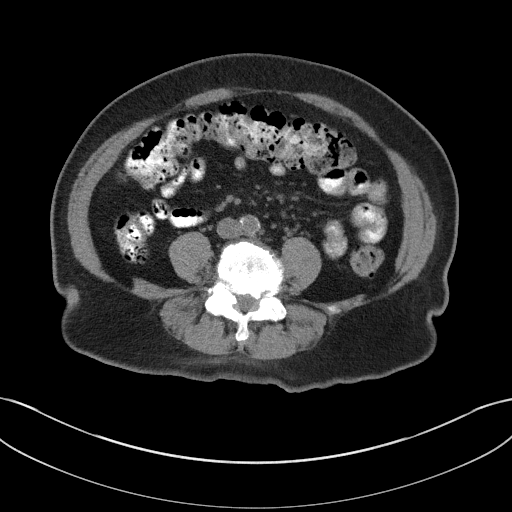
[im 52/84  soft-tissue]
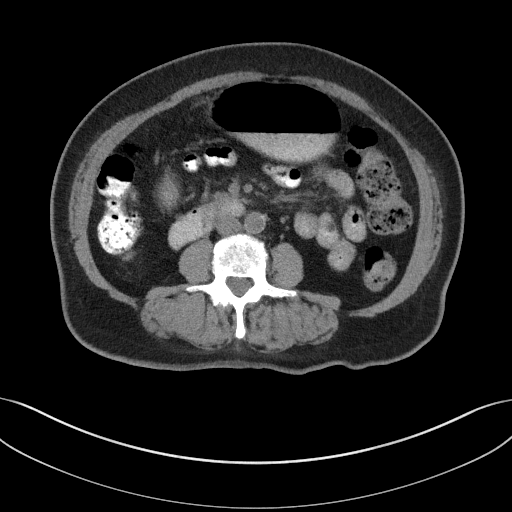
[im 52/84  bone]
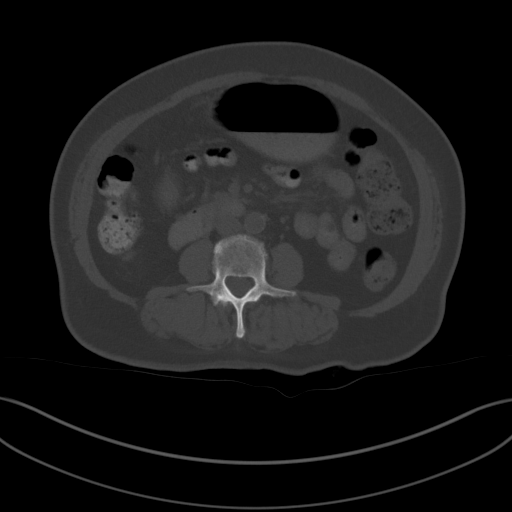
[im 56/84  soft-tissue]
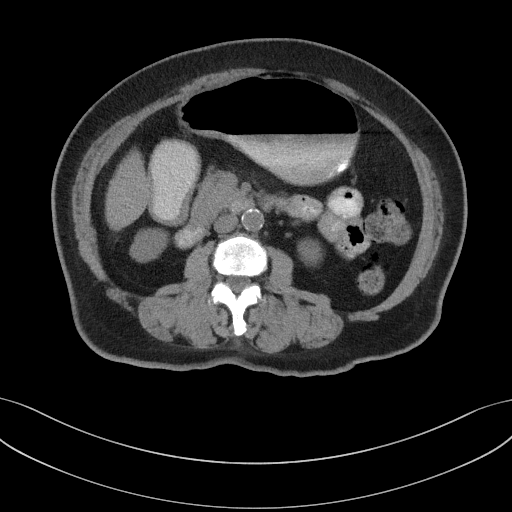
[im 64/84  soft-tissue]
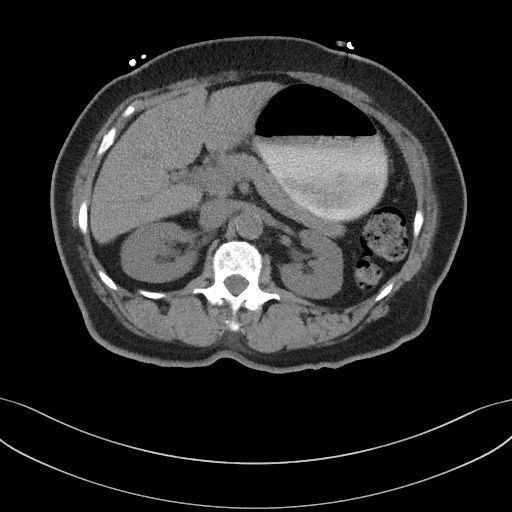
[im 68/84  soft-tissue]
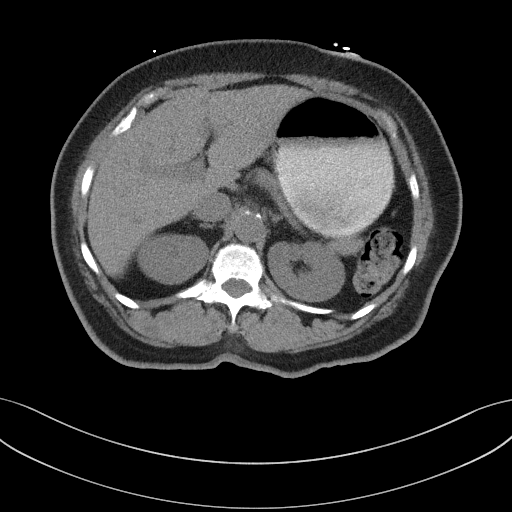
[im 72/84  soft-tissue]
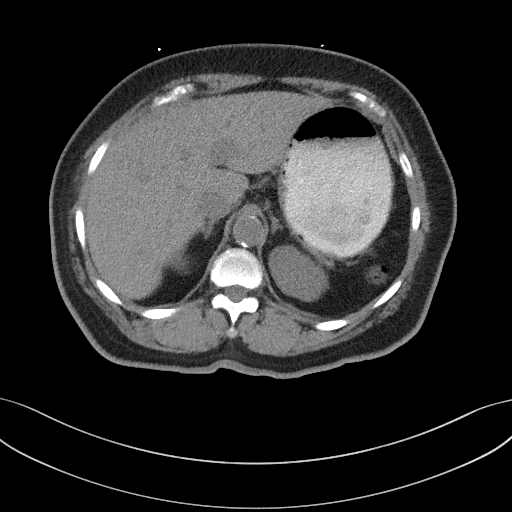
[im 80/84  soft-tissue]
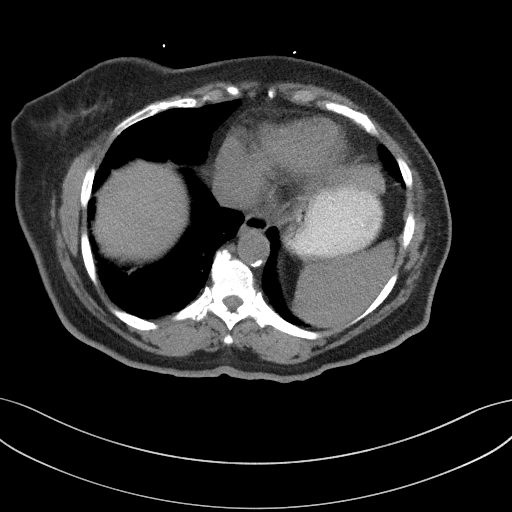

[Series 6: coronal st · coronal · 0.78mm/px · 3 of 99 slices shown]
[im 33/99  soft-tissue]
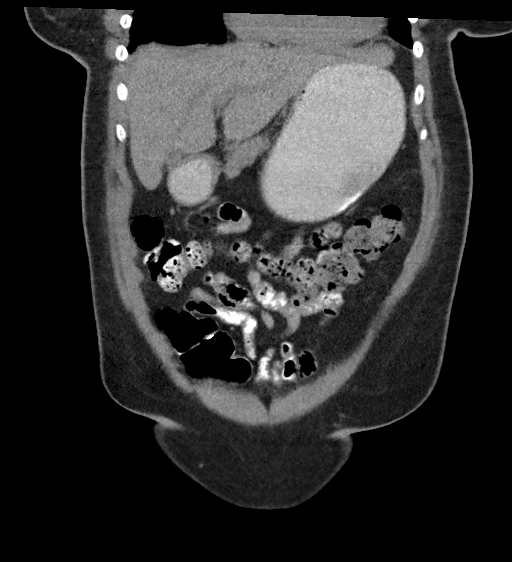
[im 44/99  soft-tissue]
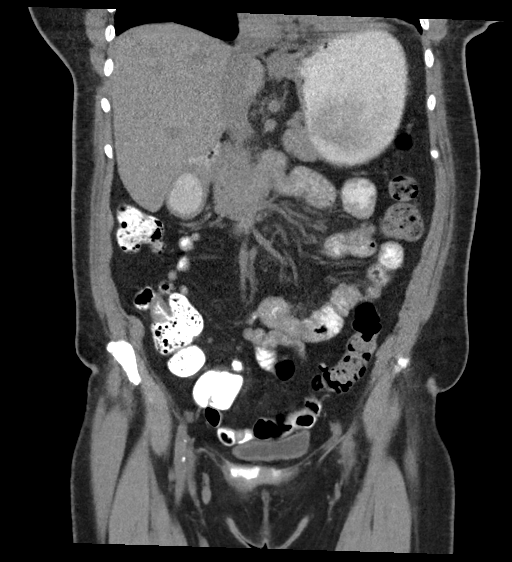
[im 55/99  soft-tissue]
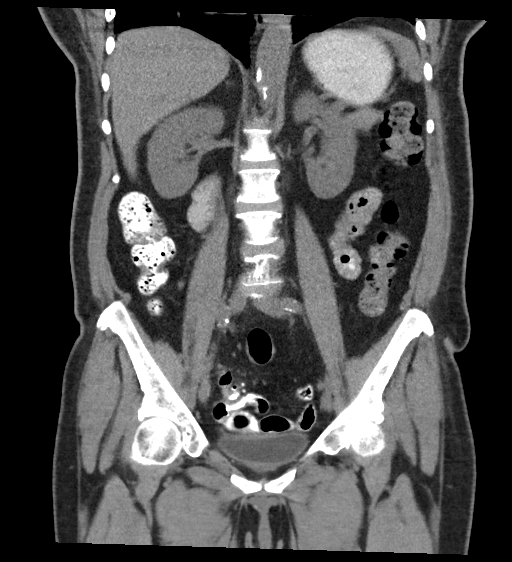

[17 of 46 positions shown; findings below may reference images not displayed]

FINDINGS: Lower chest: Scarring of the bilateral lung bases

Hepatobiliary: No solid liver abnormality is seen. No gallstones,
gallbladder wall thickening, or biliary dilatation.

Pancreas: Unremarkable. No pancreatic ductal dilatation or
surrounding inflammatory changes.

Spleen: Normal in size without significant abnormality.

Adrenals/Urinary Tract: Adrenal glands are unremarkable. Kidneys are
normal, without renal calculi, solid lesion, or hydronephrosis.
Bladder is unremarkable.

Stomach/Bowel: Stomach is within normal limits. Ileal diverticula.
No evidence of bowel wall thickening, distention, or inflammatory
changes. Sigmoid diverticula.

Vascular/Lymphatic: Aortic atherosclerosis. No enlarged abdominal or
pelvic lymph nodes.

Reproductive: Status post hysterectomy.

Other: No abdominal wall hernia or abnormality. No abdominopelvic
ascites.

Musculoskeletal: No acute or significant osseous findings.
IMPRESSION: 1. No non-contrast CT findings of the abdomen or pelvis to explain
hematuria. No evidence of urinary tract calculus or hydronephrosis.
2. Diverticulosis without evidence of acute diverticulitis.
3. Status post hysterectomy.
4. Aortic Atherosclerosis ([id number]).

## 2020-07-26 ENCOUNTER — Other Ambulatory Visit: Payer: Self-pay | Admitting: Family Medicine

## 2020-08-01 ENCOUNTER — Other Ambulatory Visit: Payer: Self-pay | Admitting: Family Medicine

## 2020-08-04 ENCOUNTER — Ambulatory Visit: Payer: Medicare Other | Admitting: Orthopedic Surgery

## 2020-08-04 ENCOUNTER — Encounter: Payer: Self-pay | Admitting: Cardiology

## 2020-08-04 ENCOUNTER — Telehealth (INDEPENDENT_AMBULATORY_CARE_PROVIDER_SITE_OTHER): Payer: Medicare Other | Admitting: Cardiology

## 2020-08-04 ENCOUNTER — Telehealth: Payer: Self-pay | Admitting: Cardiology

## 2020-08-04 VITALS — BP 113/73 | HR 75 | Ht 60.0 in | Wt 159.0 lb

## 2020-08-04 DIAGNOSIS — I739 Peripheral vascular disease, unspecified: Secondary | ICD-10-CM | POA: Diagnosis not present

## 2020-08-04 DIAGNOSIS — I25119 Atherosclerotic heart disease of native coronary artery with unspecified angina pectoris: Secondary | ICD-10-CM

## 2020-08-04 DIAGNOSIS — E782 Mixed hyperlipidemia: Secondary | ICD-10-CM | POA: Diagnosis not present

## 2020-08-04 NOTE — Patient Instructions (Addendum)

## 2020-08-04 NOTE — Telephone Encounter (Signed)
  Patient Consent for Virtual Visit         Nicole Horn has provided verbal consent on 08/04/2020 for a virtual visit (video or telephone).   CONSENT FOR VIRTUAL VISIT FOR:  Nicole Horn  By participating in this virtual visit I agree to the following:  I hereby voluntarily request, consent and authorize [facility] and its employed or contracted physicians, physician assistants, nurse practitioners or other licensed health care professionals (the Practitioner), to provide me with telemedicine health care services (the "Services") as deemed necessary by the treating Practitioner. I acknowledge and consent to receive the Services by the Practitioner via telemedicine. I understand that the telemedicine visit will involve communicating with the Practitioner through live audiovisual communication technology and the disclosure of certain medical information by electronic transmission. I acknowledge that I have been given the opportunity to request an in-person assessment or other available alternative prior to the telemedicine visit and am voluntarily participating in the telemedicine visit.  I understand that I have the right to withhold or withdraw my consent to the use of telemedicine in the course of my care at any time, without affecting my right to future care or treatment, and that the Practitioner or I may terminate the telemedicine visit at any time. I understand that I have the right to inspect all information obtained and/or recorded in the course of the telemedicine visit and may receive copies of available information for a reasonable fee.  I understand that some of the potential risks of receiving the Services via telemedicine include:  Marland Kitchen Delay or interruption in medical evaluation due to technological equipment failure or disruption; . Information transmitted may not be sufficient (e.g. poor resolution of images) to allow for appropriate medical decision making by  the Practitioner; and/or  . In rare instances, security protocols could fail, causing a breach of personal health information.  Furthermore, I acknowledge that it is my responsibility to provide information about my medical history, conditions and care that is complete and accurate to the best of my ability. I acknowledge that Practitioner's advice, recommendations, and/or decision may be based on factors not within their control, such as incomplete or inaccurate data provided by me or distortions of diagnostic images or specimens that may result from electronic transmissions. I understand that the practice of medicine is not an exact science and that Practitioner makes no warranties or guarantees regarding treatment outcomes. I acknowledge that a copy of this consent can be made available to me via my patient portal ([facility]), or I can request a printed copy by calling the office of [facility].    I understand that my insurance will be billed for this visit.   I have read or had this consent read to me. . I understand the contents of this consent, which adequately explains the benefits and risks of the Services being provided via telemedicine.  . I have been provided ample opportunity to ask questions regarding this consent and the Services and have had my questions answered to my satisfaction. . I give my informed consent for the services to be provided through the use of telemedicine in my medical care

## 2020-08-04 NOTE — Progress Notes (Signed)
Virtual Visit via Telephone Note   This visit type was conducted due to national recommendations for restrictions regarding the COVID-19 Pandemic (e.g. social distancing) in an effort to limit this patient's exposure and mitigate transmission in our community.  Due to her co-morbid illnesses, this patient is at least at moderate risk for complications without adequate follow up.  This format is felt to be most appropriate for this patient at this time.  The patient did not have access to video technology/had technical difficulties with video requiring transitioning to audio format only (telephone).  All issues noted in this document were discussed and addressed.  No physical exam could be performed with this format.  Please refer to the patient's chart for her  consent to telehealth for [facility].    Date:  08/04/2020   ID:  Nicole Horn, DOB 1946/06/04, MRN [id number] The patient was identified using 2 identifiers.  Patient Location: Home Provider Location: Office/Clinic  PCP:  [name], NP  Cardiologist:  Rozann Lesches, MD Electrophysiologist:  None   Evaluation Performed:  Follow-Up Visit  Chief Complaint:  Cardiac follow-up  History of Present Illness:    Nicole Horn is a 74 y.o. female last seen in June to establish cardiology follow-up.  We spoke by phone today.  She tells me that she has been doing reasonably well, although has been under a lot of psychosocial stress.  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  Also potentially planning to move to [place].  She does report intermittent chest pain, no regular symptoms however or change in her current medications as listed below.  Echocardiogram and lower extremity arterial studies from July are outlined below.  I reviewed her most recent lab work.   Past Medical History:  Diagnosis Date  . 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 Chronic thumb pain, right   . Essential  hypertension   . Heart attack [facility]) 2004   [place] - hospitalized with stress test by report and presumably medical therapy; subsequent cardiac catheterization (no PCI)  . History of COVID-19 08/2019  . History of irregular heartbeat   . History of syphilis   . Mixed hyperlipidemia   . PAD (peripheral artery disease) ([place]) 2019   PTCA/stent [place]  . Pneumonia due to COVID-19 virus 09/10/2019  . Sleep apnea    Pt supposed to wear CPAP, but doesn't.  . Type 2 diabetes mellitus ([place])    Past Surgical History:  Procedure Laterality Date  . ABDOMINAL HYSTERECTOMY    . BACK SURGERY    . BARTHOLIN GLAND CYST EXCISION Left 03/04/2015   Procedure: EXCISION OF LEFT BARTHOLINS TUMOR;  Surgeon: [name], MD;  Location: [facility];  Service: Gynecology;  Laterality: Left;  . CERVICAL SPINE SURGERY    . COLONOSCOPY N/A 02/01/2020   Procedure: COLONOSCOPY;  Surgeon: Daneil Dolin, MD; Scattered medium mouth diverticula in the sigmoid and descending colon, otherwise normal exam.  . CYST REMOVAL TRUNK    . ESOPHAGOGASTRODUODENOSCOPY (EGD) WITH PROPOFOL N/A 03/27/2020   Procedure: ESOPHAGOGASTRODUODENOSCOPY (EGD) WITH PROPOFOL;  Surgeon: Daneil Dolin, MD;  Location: [facility];  Service: Endoscopy;  Laterality: N/A;  1:00pm  . GIVENS CAPSULE STUDY N/A 03/27/2020   Procedure: GIVENS CAPSULE STUDY;  Surgeon: Daneil Dolin, MD;  Location: [facility];  Service: Endoscopy;  Laterality: N/A;  . Multiple cyst removal surgeries    . Stent of lower extremities       Current  Meds  Medication Sig  . albuterol (VENTOLIN HFA) 108 (90 Base) MCG/ACT inhaler Inhale 2 puffs into the lungs every 4 (four) hours as needed for wheezing or shortness of breath.  Marland Kitchen amLODipine (NORVASC) 10 MG tablet Take 1 tablet (10 mg total) by mouth daily.  . ASPIRIN 81 PO Take 81 mg by mouth daily.  . Calcium Carb-Cholecalciferol (CALTRATE 600+D3) 600-800 MG-UNIT TABS Take 2 tablets by mouth 3 (three) times daily after  meals.  . meloxicam (MOBIC) 7.5 MG tablet Take 1 tablet (7.5 mg total) by mouth daily.  . Multiple Vitamin (MULTIVITAMIN WITH MINERALS) TABS tablet Take 1 tablet by mouth daily.  Marland Kitchen olmesartan-hydrochlorothiazide (BENICAR HCT) 20-12.5 MG tablet TAKE 1 TABLET BY MOUTH  DAILY  . rosuvastatin (CRESTOR) 40 MG tablet Take 1 tablet (40 mg total) by mouth daily.     Allergies:   Patient has no known allergies.   ROS:  Emotional stress.   Prior CV studies:   The following studies were reviewed today:  Lower extremity arterial studies 03/04/2020: Summary:  Right: 30-49% stenosis noted in the superficial femoral artery. 30-49%  stenosis noted in the anterior tibial artery. Stent struts were not  visualized.   Left: 30-49% stenosis noted in the superficial femoral artery. Stents  struts were not visualized.   Summary:  Right: Resting right ankle-brachial index is within normal range. No  evidence of significant right lower extremity arterial disease. The right  toe-brachial index is normal.   Left: Resting left ankle-brachial index is within normal range. No  evidence of significant left lower extremity arterial disease. The left  toe-brachial index is normal.   Echocardiogram 03/04/2020: 1. Left ventricular ejection fraction, by estimation, is 60 to 65%. The  left ventricle has normal function. The left ventricle has no regional  wall motion abnormalities. There is mild left ventricular hypertrophy.  Left ventricular diastolic parameters  are consistent with Grade I diastolic dysfunction (impaired relaxation).  2. Right ventricular systolic function is normal. The right ventricular  size is normal. There is normal pulmonary artery systolic pressure.  3. Right atrial size was mildly dilated.  4. The mitral valve is normal in structure. No evidence of mitral valve  regurgitation. No evidence of mitral stenosis.  5. The aortic valve is tricuspid. Aortic valve regurgitation is not   visualized. No aortic stenosis is present.  6. The inferior vena cava is normal in size with greater than 50%  respiratory variability, suggesting right atrial pressure of 3 mmHg.   Labs/Other Tests and Data Reviewed:    EKG:  An ECG dated 10/05/2019 was personally reviewed today and demonstrated:  Sinus rhythm with nonspecific ST changes.  Recent Labs: 09/15/2019: Magnesium 2.4 10/05/2019: B Natriuretic Peptide 28.0 04/24/2020: ALT 26; BUN 15; Creatinine, Ser 1.05; Potassium 3.6; Sodium 142 06/06/2020: Hemoglobin 12.9; Platelets 240   Recent Lipid Panel Lab Results  Component Value Date/Time   CHOL 106 12/12/2019 10:21 AM   TRIG 85 12/12/2019 10:21 AM   HDL 46 (L) 12/12/2019 10:21 AM   CHOLHDL 2.3 12/12/2019 10:21 AM   LDLCALC 43 12/12/2019 10:21 AM    Wt Readings from Last 3 Encounters:  08/04/20 159 lb (72.1 kg)  07/23/20 160 lb (72.6 kg)  07/03/20 150 lb (68 kg)     Objective:    Vital Signs:  BP 113/73   Pulse 75   Ht 5' (1.524 m)   Wt 159 lb (72.1 kg)   BMI 31.05 kg/m    Patient spoke in  full sentences, not short of breath.  ASSESSMENT & PLAN:    1.  CAD by history as discussed above.  She does not report any accelerating angina symptoms on medical therapy and we will continue with observation for now.  She is on aspirin, Norvasc, Benicar HCT, and Crestor.  2.  Mixed hyperlipidemia, on Crestor.  Last LDL 43.  3.  PAD, follow-up arterial studies from July are outlined above.  No progressive claudication.  Continue aspirin and statin.    Time:   Today, I have spent 9 minutes with the patient with telehealth technology discussing the above problems.     Medication Adjustments/Labs and Tests Ordered: Current medicines are reviewed at length with the patient today.  Concerns regarding medicines are outlined above.   Tests Ordered: No orders of the defined types were placed in this encounter.   Medication Changes: No orders of the defined types were placed  in this encounter.   Follow Up:  In Person 6 months.  Signed, Rozann Lesches, MD  08/04/2020 2:15 PM    [place]

## 2020-08-07 ENCOUNTER — Other Ambulatory Visit: Payer: Self-pay

## 2020-08-07 ENCOUNTER — Ambulatory Visit
Admission: EM | Admit: 2020-08-07 | Discharge: 2020-08-07 | Disposition: A | Discharge status: Home / Self Care | Payer: Medicare Other | Attending: Internal Medicine | Admitting: Internal Medicine

## 2020-08-07 DIAGNOSIS — N760 Acute vaginitis: Secondary | ICD-10-CM | POA: Insufficient documentation

## 2020-08-07 DIAGNOSIS — A6004 Herpesviral vulvovaginitis: Secondary | ICD-10-CM | POA: Diagnosis not present

## 2020-08-07 MED ORDER — VALACYCLOVIR HCL 1 G PO TABS: 1000.0000 mg | ORAL_TABLET | Freq: Two times a day (BID) | ORAL | 0 refills | Status: AC

## 2020-08-07 NOTE — ED Triage Notes (Signed)
Pt states she is having a herpes outbreak that began a couple days ago, Pt also states she had syphilis last year and is concerned if she has it again

## 2020-08-07 NOTE — Discharge Instructions (Signed)
Avoid sexual intercourseAvoid sexual intercourse until the lesions have healed up. We will call you with recommendations if your STD screen is abnormal Return to urgent care if you have any worsening symptoms.

## 2020-08-07 NOTE — ED Provider Notes (Signed)
[facility]    CSN: [id number] Arrival date & time: 08/07/20  1429      History   Chief Complaint Chief Complaint  Patient presents with   SEXUALLY TRANSMITTED DISEASE    HPI Nicole Horn is a 74 y.o. female who has a history of genital herpes comes to urgent care with complaints of rash breakout in the vulvovaginal area.  Pad rash started a couple of days ago and has been progressing.  Rash is painful.  No known relieving factors.  No cosmetic changes.  Patient had unprotected sexual intercourse within the last 2 weeks.  She was involved in the same person that she contracted syphilis from.  If it appears that the sexual partner has not been treated for syphilis.  Intercourse was vaginal.  Patient says that she had some of the rash in the perianal area but did seem to have improved.  No dysuria urgency or frequency.  No vaginal discharge.   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   Type 2 diabetes mellitus [facility])     Patient Active Problem List   Diagnosis Date Noted   Caregiver role strain 07/23/2020   Annual visit for general adult medical examination with abnormal findings 04/16/2020   Postmenopausal 04/16/2020   Screening mammogram, encounter for 04/16/2020   Atypical mole 04/16/2020   Need for immunization against influenza 04/16/2020   Constipation 03/17/2020   Symptomatic anemia 03/14/2020    PVD (peripheral vascular disease) ([place]) 03/14/2020   CAD (coronary artery disease) 03/14/2020   Iron deficiency anemia 03/14/2020   DOE (dyspnea on exertion) 03/10/2020   Rectal bleeding 01/09/2020   Microcytic anemia 01/02/2020   H/O adenomatous polyp of colon 01/02/2020   FH: colon cancer 01/02/2020   Heart disease 12/11/2019   Upper airway cough syndrome 11/13/2019   Overweight with body mass index (BMI) of 29 to 29.9 in adult 10/16/2019   Essential hypertension 09/10/2019   Hyperlipemia 09/10/2019    Past Surgical History:  Procedure Laterality Date   ABDOMINAL HYSTERECTOMY     BACK SURGERY     BARTHOLIN GLAND CYST EXCISION Left 03/04/2015   Procedure: EXCISION OF LEFT BARTHOLINS TUMOR;  Surgeon: [name], MD;  Location: [facility];  Service: Gynecology;  Laterality: Left;   CERVICAL SPINE SURGERY     COLONOSCOPY N/A 02/01/2020   Procedure: COLONOSCOPY;  Surgeon: Daneil Dolin, MD; Scattered medium mouth diverticula in the sigmoid and descending colon, otherwise normal exam.   CYST REMOVAL TRUNK     ESOPHAGOGASTRODUODENOSCOPY (EGD) WITH PROPOFOL N/A 03/27/2020   Procedure: ESOPHAGOGASTRODUODENOSCOPY (EGD) WITH PROPOFOL;  Surgeon: Daneil Dolin, MD;  Location[facility];  Service: Endoscopy;  Laterality: N/A;  1:00pm   GIVENS CAPSULE STUDY N/A 03/27/2020   Procedure: GIVENS CAPSULE STUDY;  Surgeon: Daneil Dolin, MD;  Location[facility];  Service: Endoscopy;  Laterality:  N/A;   Multiple cyst removal surgeries     Stent of lower extremities      OB History    Gravida      Para      Term      Preterm      AB      Living  1     SAB      IAB      Ectopic      Multiple      Live Births               Home Medications    Prior to Admission medications   Medication Sig Start Date End Date Taking? Authorizing Provider  albuterol (VENTOLIN HFA) 108 (90 Base) MCG/ACT inhaler Inhale 2 puffs into the lungs every 4 (four)  hours as needed for wheezing or shortness of breath.    [provider]  amLODipine (NORVASC) 10 MG tablet Take 1 tablet (10 mg total) by mouth daily. 01/29/20   [name], NP  ASPIRIN 81 PO Take 81 mg by mouth daily.    [provider]  Calcium Carb-Cholecalciferol (CALTRATE 600+D3) 600-800 MG-UNIT TABS Take 2 tablets by mouth 3 (three) times daily after meals.    [provider]  meloxicam (MOBIC) 7.5 MG tablet Take 1 tablet (7.5 mg total) by mouth daily. 07/03/20   Carole Civil, MD  Multiple Vitamin (MULTIVITAMIN WITH MINERALS) TABS tablet Take 1 tablet by mouth daily.    [provider]  olmesartan-hydrochlorothiazide (BENICAR HCT) 20-12.5 MG tablet TAKE 1 TABLET BY MOUTH  DAILY 06/16/20   Tanda Rockers, MD  rosuvastatin (CRESTOR) 40 MG tablet Take 1 tablet (40 mg total) by mouth daily. 04/23/20   [name], NP  valACYclovir (VALTREX) 1000 MG tablet Take 1 tablet (1,000 mg total) by mouth 2 (two) times daily for 10 days. 08/07/20 08/17/20  LampteyMyrene Galas, MD    Family History Family History  Problem Relation Age of Onset   Colon cancer Mother        In her 19s   Breast cancer Sister    Stomach cancer Maternal Aunt    Healthy Brother    Stroke Paternal Grandmother    Heart attack Paternal Grandmother     Social History Social History   Tobacco Use   Smoking status: Former Smoker    Packs/day: 2.00    Years: 25.00    Pack years: 50.00    Types: Cigarettes    Quit date: 03/10/1985    Years since quitting: 35.4   Smokeless tobacco: Never Used  Vaping Use   Vaping Use: Never used  Substance Use Topics   Alcohol use: Yes    Alcohol/week: 0.0 standard drinks    Comment: socially   Drug use: No     Allergies   Patient has no known allergies.   Review of Systems Review of Systems  Constitutional: Negative.   Gastrointestinal: Negative.   Genitourinary: Positive for genital sores and vaginal pain.  Negative for dysuria, hematuria, urgency and vaginal discharge.  Musculoskeletal: Negative.      Physical Exam Triage Vital Signs ED Triage Vitals  Enc Vitals Group     BP 08/07/20 1450 (!) 173/81     Pulse Rate 08/07/20 1450 71     Resp 08/07/20 1450 18     Temp 08/07/20 1450 98.7 F (37.1 C)     Temp src --      SpO2 08/07/20  1450 95 %     Weight --      Height --      Head Circumference --      Peak Flow --      Pain Score 08/07/20 1446 4     Pain Loc --      Pain Edu? --      Excl. in [place]? --    No data found.  Updated Vital Signs BP (!) 173/81    Pulse 71    Temp 98.7 F (37.1 C)    Resp 18    SpO2 95%   Visual Acuity Right Eye Distance:   Left Eye Distance:   Bilateral Distance:    Right Eye Near:   Left Eye Near:    Bilateral Near:     Physical Exam Cardiovascular:     Rate and Rhythm: Normal rate and regular rhythm.     Pulses: Normal pulses.     Heart sounds: Normal heart sounds.  Pulmonary:     Effort: Pulmonary effort is normal.     Breath sounds: Normal breath sounds.  Abdominal:     General: Bowel sounds are normal.     Palpations: Abdomen is soft.  Musculoskeletal:        General: Normal range of motion.      UC Treatments / Results  Labs (all labs ordered are listed, but only abnormal results are displayed) Labs Reviewed  HIV ANTIBODY (ROUTINE TESTING W REFLEX)  RPR  CERVICOVAGINAL ANCILLARY ONLY    EKG   Radiology No results found.  Procedures Procedures (including critical care time)  Medications Ordered in UC Medications - No data to display  Initial Impression / Assessment and Plan / UC Course  I have reviewed the triage vital signs and the nursing notes.  Pertinent labs & imaging results that were available during my care of the patient were reviewed by me and considered in my medical decision making (see chart for details).     1.  Genital herpes recurrence: Valacyclovir 1000 mg twice daily for 10  days Tylenol/ibuprofen for pain Patient is advised to use barrier protection during sexual intercourse if he continues to have open sores.  2.  Exposure to STD: Cervicovaginal swab for GC/chlamydia/trichomonas We will call the patient with recommendations if test results are abnormal. Final Clinical Impressions(s) / UC Diagnoses   Final diagnoses:  Vaginitis and vulvovaginitis  Herpes simplex vulvovaginitis     Discharge Instructions     Avoid sexual intercourseAvoid sexual intercourse until the lesions have healed up. We will call you with recommendations if your STD screen is abnormal Return to urgent care if you have any worsening symptoms.   ED Prescriptions    Medication Sig Dispense Auth. Provider   valACYclovir (VALTREX) 1000 MG tablet Take 1 tablet (1,000 mg total) by mouth 2 (two) times daily for 10 days. 20 tablet Tiane Szydlowski, Myrene Galas, MD     PDMP not reviewed this encounter.   Chase Picket, MD 08/07/20 [phone]

## 2020-08-08 LAB — CERVICOVAGINAL ANCILLARY ONLY
Chlamydia: NEGATIVE
Comment: NEGATIVE
Comment: NEGATIVE
Comment: NORMAL
Neisseria Gonorrhea: NEGATIVE
Trichomonas: NEGATIVE

## 2020-08-08 LAB — HIV ANTIBODY (ROUTINE TESTING W REFLEX): HIV Screen 4th Generation wRfx: NONREACTIVE

## 2020-08-08 LAB — RPR: RPR Ser Ql: NONREACTIVE

## 2020-08-15 ENCOUNTER — Other Ambulatory Visit: Payer: Self-pay | Admitting: Family Medicine

## 2020-08-18 ENCOUNTER — Other Ambulatory Visit: Payer: Medicare Other | Admitting: Obstetrics & Gynecology

## 2020-08-20 ENCOUNTER — Other Ambulatory Visit: Payer: Self-pay

## 2020-08-20 ENCOUNTER — Telehealth (INDEPENDENT_AMBULATORY_CARE_PROVIDER_SITE_OTHER): Payer: Medicare Other | Admitting: Family Medicine

## 2020-08-20 ENCOUNTER — Encounter: Payer: Self-pay | Admitting: Family Medicine

## 2020-08-20 VITALS — Ht 60.0 in | Wt 160.0 lb

## 2020-08-20 DIAGNOSIS — M1811 Unilateral primary osteoarthritis of first carpometacarpal joint, right hand: Secondary | ICD-10-CM | POA: Diagnosis not present

## 2020-08-20 DIAGNOSIS — Z0000 Encounter for general adult medical examination without abnormal findings: Secondary | ICD-10-CM

## 2020-08-20 MED ORDER — ROSUVASTATIN CALCIUM 40 MG PO TABS: 40.0000 mg | ORAL_TABLET | Freq: Every day | ORAL | 1 refills | Status: DC

## 2020-08-20 MED ORDER — OLMESARTAN MEDOXOMIL-HCTZ 20-12.5 MG PO TABS: 1.0000 | ORAL_TABLET | Freq: Every day | ORAL | 1 refills | Status: DC

## 2020-08-20 MED ORDER — AMLODIPINE BESYLATE 10 MG PO TABS: 10.0000 mg | ORAL_TABLET | Freq: Every day | ORAL | 1 refills | Status: DC

## 2020-08-20 NOTE — Patient Instructions (Addendum)
Nicole Horn , Thank you for taking time to come for your Medicare Wellness Visit. I appreciate your ongoing commitment to your health goals. Please review the following plan we discussed and let me know if I can assist you in the future.   Please continue to practice social distancing to keep you, your family, and our community safe.  If you must go out, please wear a Mask and practice good handwashing.  Screening recommendations/referrals: Colonoscopy: up to date- due in 10 years Mammogram: up to date- due every year Bone Density: up to date- repeat in 2023 Recommended yearly ophthalmology/optometry visit for glaucoma screening and checkup Recommended yearly dental visit for hygiene and checkup  Vaccinations: Influenza vaccine: up to date Pneumococcal vaccine: due  Tdap vaccine: not covered by medicare as a preventative vaccine Shingles vaccine: Can get at pharmacy if wanted    Next appointment: 10/15/2020 as scheduled    Preventive Care 3 Years and Older, Female Preventive care refers to lifestyle choices and visits with your health care provider that can promote health and wellness. What does preventive care include?  A yearly physical exam. This is also called an annual well check.  Dental exams once or twice a year.  Routine eye exams. Ask your health care provider how often you should have your eyes checked.  Personal lifestyle choices, including:  Daily care of your teeth and gums.  Regular physical activity.  Eating a healthy diet.  Avoiding tobacco and drug use.  Limiting alcohol use.  Practicing safe sex.  Taking low-dose aspirin every day.  Taking vitamin and mineral supplements as recommended by your health care provider. What happens during an annual well check? The services and screenings done by your health care provider during your annual well check will depend on your age, overall health, lifestyle risk factors, and family history of  disease. Counseling  Your health care provider may ask you questions about your:  Alcohol use.  Tobacco use.  Drug use.  Emotional well-being.  Home and relationship well-being.  Sexual activity.  Eating habits.  History of falls.  Memory and ability to understand (cognition).  Work and work Statistician.  Reproductive health. Screening  You may have the following tests or measurements:  Height, weight, and BMI.  Blood pressure.  Lipid and cholesterol levels. These may be checked every 5 years, or more frequently if you are over 59 years old.  Skin check.  Lung cancer screening. You may have this screening every year starting at age 6 if you have a 30-pack-year history of smoking and currently smoke or have quit within the past 15 years.  Fecal occult blood test (FOBT) of the stool. You may have this test every year starting at age 46.  Flexible sigmoidoscopy or colonoscopy. You may have a sigmoidoscopy every 5 years or a colonoscopy every 10 years starting at age 69.  Hepatitis C blood test.  Hepatitis B blood test.  Sexually transmitted disease (STD) testing.  Diabetes screening. This is done by checking your blood sugar (glucose) after you have not eaten for a while (fasting). You may have this done every 1-3 years.  Bone density scan. This is done to screen for osteoporosis. You may have this done starting at age 23.  Mammogram. This may be done every 1-2 years. Talk to your health care provider about how often you should have regular mammograms. Talk with your health care provider about your test results, treatment options, and if necessary, the need for more  tests. Vaccines  Your health care provider may recommend certain vaccines, such as:  Influenza vaccine. This is recommended every year.  Tetanus, diphtheria, and acellular pertussis (Tdap, Td) vaccine. You may need a Td booster every 10 years.  Zoster vaccine. You may need this after age  53.  Pneumococcal 13-valent conjugate (PCV13) vaccine. One dose is recommended after age 67.  Pneumococcal polysaccharide (PPSV23) vaccine. One dose is recommended after age 11. Talk to your health care provider about which screenings and vaccines you need and how often you need them. This information is not intended to replace advice given to you by your health care provider. Make sure you discuss any questions you have with your health care provider. Document Released: 09/05/2015 Document Revised: 04/28/2016 Document Reviewed: 06/10/2015 Elsevier Interactive Patient Education  2017 [place] Prevention in the Home Falls can cause injuries. They can happen to people of all ages. There are many things you can do to make your home safe and to help prevent falls. What can I do on the outside of my home?  Regularly fix the edges of walkways and driveways and fix any cracks.  Remove anything that might make you trip as you walk through a door, such as a raised step or threshold.  Trim any bushes or trees on the path to your home.  Use bright outdoor lighting.  Clear any walking paths of anything that might make someone trip, such as rocks or tools.  Regularly check to see if handrails are loose or broken. Make sure that both sides of any steps have handrails.  Any raised decks and porches should have guardrails on the edges.  Have any leaves, snow, or ice cleared regularly.  Use sand or salt on walking paths during winter.  Clean up any spills in your garage right away. This includes oil or grease spills. What can I do in the bathroom?  Use night lights.  Install grab bars by the toilet and in the tub and shower. Do not use towel bars as grab bars.  Use non-skid mats or decals in the tub or shower.  If you need to sit down in the shower, use a plastic, non-slip stool.  Keep the floor dry. Clean up any water that spills on the floor as soon as it happens.  Remove  soap buildup in the tub or shower regularly.  Attach bath mats securely with double-sided non-slip rug tape.  Do not have throw rugs and other things on the floor that can make you trip. What can I do in the bedroom?  Use night lights.  Make sure that you have a light by your bed that is easy to reach.  Do not use any sheets or blankets that are too big for your bed. They should not hang down onto the floor.  Have a firm chair that has side arms. You can use this for support while you get dressed.  Do not have throw rugs and other things on the floor that can make you trip. What can I do in the kitchen?  Clean up any spills right away.  Avoid walking on wet floors.  Keep items that you use a lot in easy-to-reach places.  If you need to reach something above you, use a strong step stool that has a grab bar.  Keep electrical cords out of the way.  Do not use floor polish or wax that makes floors slippery. If you must use wax, use non-skid floor  wax.  Do not have throw rugs and other things on the floor that can make you trip. What can I do with my stairs?  Do not leave any items on the stairs.  Make sure that there are handrails on both sides of the stairs and use them. Fix handrails that are broken or loose. Make sure that handrails are as long as the stairways.  Check any carpeting to make sure that it is firmly attached to the stairs. Fix any carpet that is loose or worn.  Avoid having throw rugs at the top or bottom of the stairs. If you do have throw rugs, attach them to the floor with carpet tape.  Make sure that you have a light switch at the top of the stairs and the bottom of the stairs. If you do not have them, ask someone to add them for you. What else can I do to help prevent falls?  Wear shoes that:  Do not have high heels.  Have rubber bottoms.  Are comfortable and fit you well.  Are closed at the toe. Do not wear sandals.  If you use a  stepladder:  Make sure that it is fully opened. Do not climb a closed stepladder.  Make sure that both sides of the stepladder are locked into place.  Ask someone to hold it for you, if possible.  Clearly mark and make sure that you can see:  Any grab bars or handrails.  First and last steps.  Where the edge of each step is.  Use tools that help you move around (mobility aids) if they are needed. These include:  Canes.  Walkers.  Scooters.  Crutches.  Turn on the lights when you go into a dark area. Replace any light bulbs as soon as they burn out.  Set up your furniture so you have a clear path. Avoid moving your furniture around.  If any of your floors are uneven, fix them.  If there are any pets around you, be aware of where they are.  Review your medicines with your doctor. Some medicines can make you feel dizzy. This can increase your chance of falling. Ask your doctor what other things that you can do to help prevent falls. This information is not intended to replace advice given to you by your health care provider. Make sure you discuss any questions you have with your health care provider. Document Released: 06/05/2009 Document Revised: 01/15/2016 Document Reviewed: 09/13/2014 Elsevier Interactive Patient Education  2017 [employer].

## 2020-08-20 NOTE — Progress Notes (Signed)
Subjective:   Nicole Horn is a 74 y.o. female who presents for Medicare Annual (Subsequent) preventive examination.  Participants: Nurse for intake and work up; Patient and Provider for Visit and Wrap up  Method of visit: Telephone Location of Patient: Home Location of Provider: Office Consent was obtain for visit over the telephone. Services rendered by provider: Visit was performed via telephone I verified that I am speaking with the correct person using two identifiers.  Review of Systems     Cardiac Risk Factors include: dyslipidemia;advanced age (>108men, >40 women);obesity (BMI >30kg/m2);hypertension     Objective:    Today's Vitals   08/20/20 1454  Weight: 160 lb (72.6 kg)  Height: 5' (1.524 m)  PainSc: 0-No pain   Body mass index is 31.25 kg/m.  Advanced Directives 06/09/2020 05/09/2020 05/02/2020 04/24/2020 03/27/2020 03/14/2020 02/01/2020  Does Patient Have a Medical Advance Directive? Yes Yes Yes Yes Yes No Yes  Type of Estate agent of [place];Living will Healthcare Power of [place];Living will Healthcare Power of [place];Living will Healthcare Power of [place];Living will Healthcare Power of [place];Living will - Healthcare Power of [place];Living will  Does patient want to make changes to medical advance directive? No - Patient declined No - Patient declined No - Patient declined No - Patient declined - - -  Copy of Healthcare Power of Attorney in Chart? No - copy requested No - copy requested No - copy requested No - copy requested No - copy requested - -  Would patient like information on creating a medical advance directive? - - - - - No - Patient declined -    Current Medications (verified) Outpatient Encounter Medications as of 08/20/2020  Medication Sig  . albuterol (VENTOLIN HFA) 108 (90 Base) MCG/ACT inhaler Inhale 2 puffs into the lungs every 4 (four) hours as needed for wheezing or shortness of breath.  Marland Kitchen amLODipine  (NORVASC) 10 MG tablet Take 1 tablet (10 mg total) by mouth daily.  . ASPIRIN 81 PO Take 81 mg by mouth daily.  . Calcium Carb-Cholecalciferol (CALTRATE 600+D3) 600-800 MG-UNIT TABS Take 2 tablets by mouth 3 (three) times daily after meals.  . meloxicam (MOBIC) 7.5 MG tablet Take 1 tablet (7.5 mg total) by mouth daily.  . Multiple Vitamin (MULTIVITAMIN WITH MINERALS) TABS tablet Take 1 tablet by mouth daily.  Marland Kitchen olmesartan-hydrochlorothiazide (BENICAR HCT) 20-12.5 MG tablet Take 1 tablet by mouth daily.  . rosuvastatin (CRESTOR) 40 MG tablet Take 1 tablet (40 mg total) by mouth daily.  . [DISCONTINUED] amLODipine (NORVASC) 10 MG tablet Take 1 tablet (10 mg total) by mouth daily.  . [DISCONTINUED] olmesartan-hydrochlorothiazide (BENICAR HCT) 20-12.5 MG tablet TAKE 1 TABLET BY MOUTH  DAILY  . [DISCONTINUED] rosuvastatin (CRESTOR) 40 MG tablet Take 1 tablet (40 mg total) by mouth daily.   No facility-administered encounter medications on file as of 08/20/2020.    Allergies (verified) Patient has no known allergies.   History: Past Medical History:  Diagnosis Date  . 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 Chronic thumb pain, right   . Essential hypertension   . Heart attack [facility]) 2004   [place] - hospitalized with stress test by report and presumably medical therapy; subsequent cardiac catheterization (no PCI)  . History of COVID-19 08/2019  . History of irregular heartbeat   . History of syphilis   . Mixed hyperlipidemia   . PAD (peripheral artery disease) (HCC) 2019   PTCA/stent [place]  . Pneumonia due to COVID-19 virus 09/10/2019  .  Sleep apnea    Pt supposed to wear CPAP, but doesn't.  . Type 2 diabetes mellitus ([place])    Past Surgical History:  Procedure Laterality Date  . ABDOMINAL HYSTERECTOMY    . BACK SURGERY    . BARTHOLIN GLAND CYST EXCISION Left 03/04/2015   Procedure: EXCISION OF LEFT BARTHOLINS TUMOR;  Surgeon: [name], MD;  Location: [facility];  Service:  Gynecology;  Laterality: Left;  . CERVICAL SPINE SURGERY    . COLONOSCOPY N/A 02/01/2020   Procedure: COLONOSCOPY;  Surgeon: Daneil Dolin, MD; Scattered medium mouth diverticula in the sigmoid and descending colon, otherwise normal exam.  . CYST REMOVAL TRUNK    . ESOPHAGOGASTRODUODENOSCOPY (EGD) WITH PROPOFOL N/A 03/27/2020   Procedure: ESOPHAGOGASTRODUODENOSCOPY (EGD) WITH PROPOFOL;  Surgeon: Daneil Dolin, MD;  Location: [facility];  Service: Endoscopy;  Laterality: N/A;  1:00pm  . GIVENS CAPSULE STUDY N/A 03/27/2020   Procedure: GIVENS CAPSULE STUDY;  Surgeon: Daneil Dolin, MD;  Location: [facility];  Service: Endoscopy;  Laterality: N/A;  . Multiple cyst removal surgeries    . Stent of lower extremities     Family History  Problem Relation Age of Onset  . Colon cancer Mother        In her 87s  . Breast cancer Sister   . Stomach cancer Maternal Aunt   . Healthy Brother   . Stroke Paternal Grandmother   . Heart attack Paternal Grandmother    Social History   Socioeconomic History  . Marital status: Widowed    Spouse name: Not on file  . Number of children: 1  . Years of education: Not on file  . Highest education level: Some college, no degree  Occupational History  . Not on file  Tobacco Use  . Smoking status: Former Smoker    Packs/day: 2.00    Years: 25.00    Pack years: 50.00    Types: Cigarettes    Quit date: 03/10/1985    Years since quitting: 35.4  . Smokeless tobacco: Never Used  Vaping Use  . Vaping Use: Never used  Substance and Sexual Activity  . Alcohol use: Yes    Alcohol/week: 0.0 standard drinks    Comment: socially  . Drug use: No  . Sexual activity: Not Currently    Birth control/protection: Surgical  Other Topics Concern  . Not on file  Social History Narrative   Lives in [place] and [place] 6 months here and there owns an apt in [place] alone, but helps to care for mother who is 37 years (goes between the kids)    Sister is in [place] is in [place]      Son who is 33 years old lives in [place], [place]    Has 6 grand kids and 7 great grands      Enjoys exercise (harder since COVID)-dancing      Diet: eats all foods-enjoys smoothies, avoids fried foods, limited red meat if any   Caffeine: coffee 1 cup in the morning-sometimes will have 1 in evening    Water: 3-4 bottles daily       Wears seat belt   Smoke detectors in home   Does not use phone while driving            Social Determinants of Health   Financial Resource Strain: Medium Risk  . Difficulty of Paying Living Expenses: Somewhat hard  Food Insecurity: No Food Insecurity  .  Worried About Charity fundraiser in the Last Year: Never true  . Ran Out of Food in the Last Year: Never true  Transportation Needs: No Transportation Needs  . Lack of Transportation (Medical): No  . Lack of Transportation (Non-Medical): No  Physical Activity: Insufficiently Active  . Days of Exercise per Week: 3 days  . Minutes of Exercise per Session: 20 min  Stress: No Stress Concern Present  . Feeling of Stress : Not at all  Social Connections: Moderately Isolated  . Frequency of Communication with Friends and Family: More than three times a week  . Frequency of Social Gatherings with Friends and Family: More than three times a week  . Attends Religious Services: More than 4 times per year  . Active Member of Clubs or Organizations: No  . Attends Archivist Meetings: Never  . Marital Status: Widowed    Tobacco Counseling Counseling given: Not Answered   Clinical Intake:  Pre-visit preparation completed: No  Pain : No/denies pain Pain Score: 0-No pain     Nutritional Status: BMI > 30  Obese Diabetes: No  How often do you need to have someone help you when you read instructions, pamphlets, or other written materials from your doctor or pharmacy?: 2 - Rarely  Diabetic?no  Interpreter Needed?: No      Activities of Daily  Living In your present state of health, do you have any difficulty performing the following activities: 08/20/2020 03/18/2020  Hearing? N N  Vision? N N  Difficulty concentrating or making decisions? Y N  Walking or climbing stairs? N N  Dressing or bathing? N N  Doing errands, shopping? N N  Preparing Food and eating ? N -  Using the Toilet? N -  In the past six months, have you accidently leaked urine? N -  Do you have problems with loss of bowel control? N -  Managing your Medications? N -  Managing your Finances? N -  Housekeeping or managing your Housekeeping? N -  Some recent data might be hidden    Patient Care Team: [name], NP as PCP - General (Family Medicine) Satira Sark, MD as PCP - Cardiology (Cardiology) Gala Romney Cristopher Estimable, MD as Consulting Physician (Gastroenterology)  Indicate any recent Medical Services you may have received from other than [facility] providers in the past year (date may be approximate).     Assessment:   This is a routine wellness examination for [place].  Hearing/Vision screen No exam data present  Dietary issues and exercise activities discussed: Current Exercise Habits: Home exercise routine, Type of exercise: walking, Time (Minutes): 25, Frequency (Times/Week): 3, Weekly Exercise (Minutes/Week): 75, Intensity: Mild  Goals    . Patient Stated     Get pneumonia vaccine    . Prevent falls      Depression Screen PHQ 2/9 Scores 08/20/2020 07/23/2020 04/16/2020 03/13/2020 10/16/2019  PHQ - 2 Score 0 0 1 0 0  PHQ- 9 Score - - 5 - -    Fall Risk Fall Risk  08/20/2020 07/23/2020 04/16/2020 03/13/2020 10/16/2019  Falls in the past year? 1 1 0 0 1  Number falls in past yr: 0 0 0 0 0  Injury with Fall? 0 1 0 0 1  Risk for fall due to : - Impaired balance/gait No Fall Risks No Fall Risks -  Follow up - Falls evaluation completed Falls evaluation completed Falls evaluation completed -    FALL RISK PREVENTION PERTAINING TO THE HOME:  Any  stairs in or around the home? Yes  If so, are there any without handrails? No  Home free of loose throw rugs in walkways, pet beds, electrical cords, etc? Yes  Adequate lighting in your home to reduce risk of falls? Yes   ASSISTIVE DEVICES UTILIZED TO PREVENT FALLS:  Life alert? No  Use of a cane, walker or w/c? No  Grab bars in the bathroom? No  Shower chair or bench in shower? No  Elevated toilet seat or a handicapped toilet? No   TIMED UP AND GO:  Was the test performed? not performed due to visit being by phone Length of time to ambulate 10 feet:     Cognitive Function: MMSE - Mini Mental State Exam 07/23/2020  Orientation to time 4  Orientation to Place 5  Registration 3  Attention/ Calculation 5  Recall 3  Language- name 2 objects 2  Language- repeat 1  Language- follow 3 step command 3  Language- read & follow direction 1  Write a sentence 1  Copy design 1  Total score 29        Immunizations Immunization History  Administered Date(s) Administered  . Influenza,inj,Quad PF,6+ Mos 04/16/2020  . Influenza-Unspecified 06/19/2019  . Moderna Sars-Covid-2 Vaccination 11/02/2019, 12/05/2019    TDAP status: Up to date- not covered as preventative   Flu Vaccine status: Up to date  Pneumococcal vaccine status: Due, Education has been provided regarding the importance of this vaccine. Advised may receive this vaccine at local pharmacy or Health Dept. Aware to provide a copy of the vaccination record if obtained from local pharmacy or Health Dept. Verbalized acceptance and understanding.  Covid-19 vaccine status: Completed vaccines  Qualifies for Shingles Vaccine? Yes   Zostavax completed No   Shingrix Completed?: No.    Education has been provided regarding the importance of this vaccine. Patient has been advised to call insurance company to determine out of pocket expense if they have not yet received this vaccine. Advised may also receive vaccine at local pharmacy  or Health Dept. Verbalized acceptance and understanding.  Screening Tests Health Maintenance  Topic Date Due  . PNA vac Low Risk Adult (1 of 2 - PCV13) Never done  . COVID-19 Vaccine (3 - Booster for Moderna series) 06/05/2020  . TETANUS/TDAP  04/16/2021 (Originally 04/14/1965)  . MAMMOGRAM  05/01/2022  . COLONOSCOPY (Pts 45-17yrs Insurance coverage will need to be confirmed)  01/31/2030  . INFLUENZA VACCINE  Completed  . DEXA SCAN  Completed  . Hepatitis C Screening  Completed    Health Maintenance  Health Maintenance Due  Topic Date Due  . PNA vac Low Risk Adult (1 of 2 - PCV13) Never done  . COVID-19 Vaccine (3 - Booster for Moderna series) 06/05/2020    Colorectal cancer screening: Type of screening: Colonoscopy. Completed yes. Repeat every 10 years  Mammogram status: Completed 05/01/20. Repeat every year  Bone Density status: Completed 05/01/20. Results reflect: Bone density results: OSTEOPENIA. Repeat every 2 years.  Lung Cancer Screening: (Low Dose CT Chest recommended if Age 59-80 years, 30 pack-year currently smoking OR have quit w/in 15years.) does not qualify.   Lung Cancer Screening Referral: no  Additional Screening:  Hepatitis C Screening: does qualify; Completed 2016- negative  Vision Screening: Recommended annual ophthalmology exams for early detection of glaucoma and other disorders of the eye. Is the patient up to date with their annual eye exam?  Yes  Who is the provider or what is the name of  the office in which the patient attends annual eye exams?  If pt is not established with a provider, would they like to be referred to a provider to establish care? No .   Dental Screening: Recommended annual dental exams for proper oral hygiene  Community Resource Referral / Chronic Care Management: CRR required this visit?  No   CCM required this visit?  No      Plan:     1. Encounter for Medicare annual wellness exam   I have personally reviewed and  noted the following in the patient's chart:   . Medical and social history . Use of alcohol, tobacco or illicit drugs  . Current medications and supplements . Functional ability and status . Nutritional status . Physical activity . Advanced directives . List of other physicians . Hospitalizations, surgeries, and ER visits in previous 12 months . Vitals . Screenings to include cognitive, depression, and falls . Referrals and appointments  In addition, I have reviewed and discussed with patient certain preventive protocols, quality metrics, and best practice recommendations. A written personalized care plan for preventive services as well as general preventive health recommendations were provided to patient.   Agreed with the above documentation  Kate Sable, LPN, LPN   [id number]   Nurse Notes: Visit performed by telephone- which took 25 mins to complete. Provider in the office

## 2020-09-04 ENCOUNTER — Inpatient Hospital Stay (HOSPITAL_COMMUNITY): Payer: Medicare Other | Attending: Hematology

## 2020-09-04 ENCOUNTER — Other Ambulatory Visit: Payer: Self-pay

## 2020-09-04 DIAGNOSIS — Z800 Family history of malignant neoplasm of digestive organs: Secondary | ICD-10-CM | POA: Diagnosis not present

## 2020-09-04 DIAGNOSIS — I2510 Atherosclerotic heart disease of native coronary artery without angina pectoris: Secondary | ICD-10-CM | POA: Insufficient documentation

## 2020-09-04 DIAGNOSIS — E782 Mixed hyperlipidemia: Secondary | ICD-10-CM | POA: Diagnosis not present

## 2020-09-04 DIAGNOSIS — K5730 Diverticulosis of large intestine without perforation or abscess without bleeding: Secondary | ICD-10-CM | POA: Insufficient documentation

## 2020-09-04 DIAGNOSIS — Z87891 Personal history of nicotine dependence: Secondary | ICD-10-CM | POA: Insufficient documentation

## 2020-09-04 DIAGNOSIS — R200 Anesthesia of skin: Secondary | ICD-10-CM | POA: Insufficient documentation

## 2020-09-04 DIAGNOSIS — D509 Iron deficiency anemia, unspecified: Secondary | ICD-10-CM | POA: Diagnosis not present

## 2020-09-04 DIAGNOSIS — Z79899 Other long term (current) drug therapy: Secondary | ICD-10-CM | POA: Insufficient documentation

## 2020-09-04 DIAGNOSIS — I252 Old myocardial infarction: Secondary | ICD-10-CM | POA: Diagnosis not present

## 2020-09-04 DIAGNOSIS — Z803 Family history of malignant neoplasm of breast: Secondary | ICD-10-CM | POA: Insufficient documentation

## 2020-09-04 DIAGNOSIS — I10 Essential (primary) hypertension: Secondary | ICD-10-CM | POA: Insufficient documentation

## 2020-09-04 DIAGNOSIS — Z8616 Personal history of COVID-19: Secondary | ICD-10-CM | POA: Diagnosis not present

## 2020-09-04 DIAGNOSIS — Z823 Family history of stroke: Secondary | ICD-10-CM | POA: Diagnosis not present

## 2020-09-04 DIAGNOSIS — R5383 Other fatigue: Secondary | ICD-10-CM | POA: Insufficient documentation

## 2020-09-04 DIAGNOSIS — K269 Duodenal ulcer, unspecified as acute or chronic, without hemorrhage or perforation: Secondary | ICD-10-CM | POA: Diagnosis not present

## 2020-09-04 DIAGNOSIS — Z8249 Family history of ischemic heart disease and other diseases of the circulatory system: Secondary | ICD-10-CM | POA: Insufficient documentation

## 2020-09-04 LAB — CBC WITH DIFFERENTIAL/PLATELET
Abs Immature Granulocytes: 0.01 10*3/uL (ref 0.00–0.07)
Basophils Absolute: 0 10*3/uL (ref 0.0–0.1)
Basophils Relative: 1 %
Eosinophils Absolute: 0.1 10*3/uL (ref 0.0–0.5)
Eosinophils Relative: 3 %
HCT: 45.5 % (ref 36.0–46.0)
Hemoglobin: 14.4 g/dL (ref 12.0–15.0)
Immature Granulocytes: 0 %
Lymphocytes Relative: 44 %
Lymphs Abs: 2.1 10*3/uL (ref 0.7–4.0)
MCH: 27.2 pg (ref 26.0–34.0)
MCHC: 31.6 g/dL (ref 30.0–36.0)
MCV: 86 fL (ref 80.0–100.0)
Monocytes Absolute: 0.4 10*3/uL (ref 0.1–1.0)
Monocytes Relative: 7 %
Neutro Abs: 2.2 10*3/uL (ref 1.7–7.7)
Neutrophils Relative %: 45 %
Platelets: 253 10*3/uL (ref 150–400)
RBC: 5.29 MIL/uL — ABNORMAL HIGH (ref 3.87–5.11)
RDW: 14.2 % (ref 11.5–15.5)
WBC: 4.9 10*3/uL (ref 4.0–10.5)
nRBC: 0 % (ref 0.0–0.2)

## 2020-09-04 LAB — IRON AND TIBC
Iron: 76 ug/dL (ref 28–170)
Saturation Ratios: 22 % (ref 10.4–31.8)
TIBC: 353 ug/dL (ref 250–450)
UIBC: 277 ug/dL

## 2020-09-04 LAB — FERRITIN: Ferritin: 179 ng/mL (ref 11–307)

## 2020-09-08 ENCOUNTER — Encounter: Payer: Self-pay | Admitting: Emergency Medicine

## 2020-09-08 ENCOUNTER — Ambulatory Visit
Admission: EM | Admit: 2020-09-08 | Discharge: 2020-09-08 | Disposition: A | Discharge status: Home / Self Care | Payer: Medicare Other | Attending: Family Medicine | Admitting: Family Medicine

## 2020-09-08 ENCOUNTER — Other Ambulatory Visit (HOSPITAL_COMMUNITY): Payer: Medicare Other

## 2020-09-08 ENCOUNTER — Other Ambulatory Visit: Payer: Self-pay

## 2020-09-08 DIAGNOSIS — M79644 Pain in right finger(s): Secondary | ICD-10-CM

## 2020-09-08 DIAGNOSIS — R519 Headache, unspecified: Secondary | ICD-10-CM | POA: Diagnosis not present

## 2020-09-08 DIAGNOSIS — Z1152 Encounter for screening for COVID-19: Secondary | ICD-10-CM | POA: Diagnosis not present

## 2020-09-08 DIAGNOSIS — I10 Essential (primary) hypertension: Secondary | ICD-10-CM

## 2020-09-08 DIAGNOSIS — M25551 Pain in right hip: Secondary | ICD-10-CM

## 2020-09-08 DIAGNOSIS — G8929 Other chronic pain: Secondary | ICD-10-CM

## 2020-09-08 DIAGNOSIS — M19049 Primary osteoarthritis, unspecified hand: Secondary | ICD-10-CM

## 2020-09-08 MED ORDER — MELOXICAM 7.5 MG PO TABS: 7.5000 mg | ORAL_TABLET | Freq: Every day | ORAL | 2 refills | Status: AC

## 2020-09-08 NOTE — ED Provider Notes (Signed)
[place]   [id number] 09/08/20 Arrival Time: 1228  [id number] PAIN  SUBJECTIVE: History from: patient. Lamica Horn is a 75 y.o. female complains of chronic right thumb and right hip pain.  Reports that the thumb pain has been worse over the last couple of days.  Reports that in the past she has used meloxicam with good results.  Reports that she is out of this medication.  Reports that she has been also experiencing an intermittent headache, and is requesting a COVID test to rule out.  Denies a precipitating event or specific injury. Has tried OTC medications without relief.  Symptoms are made worse with activity.  Denies fever, chills, erythema, ecchymosis, effusion, weakness, numbness and tingling, saddle paresthesias, loss of bowel or bladder function.      ROS: As per HPI.  All other pertinent ROS negative.     Past Medical History:  Diagnosis Date  . 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 Chronic thumb pain, right   . Essential hypertension   . Heart attack [facility]) 2004   [place] - hospitalized with stress test by report and presumably medical therapy; subsequent cardiac catheterization (no PCI)  . History of COVID-19 08/2019  . History of irregular heartbeat   . History of syphilis   . Mixed hyperlipidemia   . PAD (peripheral artery disease) ([place]) 2019   PTCA/stent [place]  . Pneumonia due to COVID-19 virus 09/10/2019  . Sleep apnea    Pt supposed to wear CPAP, but doesn't.  . Type 2 diabetes mellitus ([place])    Past Surgical History:  Procedure Laterality Date  . ABDOMINAL HYSTERECTOMY    . BACK SURGERY    . BARTHOLIN GLAND CYST EXCISION Left 03/04/2015   Procedure: EXCISION OF LEFT BARTHOLINS TUMOR;  Surgeon: [name], MD;  Location: [facility] ORS;  Service: Gynecology;  Laterality: Left;  . CERVICAL SPINE SURGERY    . COLONOSCOPY N/A 02/01/2020   Procedure: COLONOSCOPY;  Surgeon: Daneil Dolin, MD; Scattered medium mouth diverticula in the  sigmoid and descending colon, otherwise normal exam.  . CYST REMOVAL TRUNK    . ESOPHAGOGASTRODUODENOSCOPY (EGD) WITH PROPOFOL N/A 03/27/2020   Procedure: ESOPHAGOGASTRODUODENOSCOPY (EGD) WITH PROPOFOL;  Surgeon: Daneil Dolin, MD;  Location: [facility] END[facility] SUITE;  Service: Endoscopy;  Laterality: N/A;  1:00pm  . GIVENS CAPSULE STUDY N/A 03/27/2020   Procedure: GIVENS CAPSULE STUDY;  Surgeon: Daneil Dolin, MD;  Location: [facility] END[facility] SU[facility];  Service: Endoscopy;  Laterality: N/A;  . Multiple cyst removal surgeries    . Stent of lower extremities     No Known Allergies No current facility-administered medications on file prior to encounter.   Current Outpatient Medications on File Prior to Encounter  Medication Sig Dispense Refill  . albuterol (VENTOLIN HFA) 108 (90 Base) MCG/ACT inhaler Inhale 2 puffs into the lungs every 4 (four) hours as needed for wheezing or shortness of breath.    Marland Kitchen amLODipine (NORVASC) 10 MG tablet Take 1 tablet (10 mg total) by mouth daily. 90 tablet 1  . ASPIRIN 81 PO Take 81 mg by mouth daily.    . Calcium Carb-Cholecalciferol (CALTRATE 600+D3) 600-800 MG-UNIT TABS Take 2 tablets by mouth 3 (three) times daily after meals.    . Multiple Vitamin (MULTIVITAMIN WITH MINERALS) TABS tablet Take 1 tablet by mouth daily.    Marland Kitchen olmesartan-hydrochlorothiazide (BENICAR HCT) 20-12.5 MG tablet Take 1 tablet by mouth daily. 90 tablet 1  . rosuvastatin (CRESTOR) 40 MG tablet Take  1 tablet (40 mg total) by mouth daily. 90 tablet 1   Social History   Socioeconomic History  . Marital status: Widowed    Spouse name: Not on file  . Number of children: 1  . Years of education: Not on file  . Highest education level: Some college, no degree  Occupational History  . Not on file  Tobacco Use  . Smoking status: Former Smoker    Packs/day: 2.00    Years: 25.00    Pack years: 50.00    Types: Cigarettes    Quit date: 03/10/1985    Years since quitting: 35.5  . Smokeless tobacco: Never Used   Vaping Use  . Vaping Use: Never used  Substance and Sexual Activity  . Alcohol use: Yes    Alcohol/week: 0.0 standard drinks    Comment: socially  . Drug use: No  . Sexual activity: Not Currently    Birth control/protection: Surgical  Other Topics Concern  . 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, but helps to care for mother who is 28 years (goes between the kids)   Sister is in [place] is in [place]      Son who is 56 years old lives in [place], [place]    Has 6 grand kids and 7 great grands      Enjoys exercise (harder since COVID)-dancing      Diet: eats all foods-enjoys smoothies, avoids fried foods, limited red meat if any   Caffeine: coffee 1 cup in the morning-sometimes will have 1 in evening    Water: 3-4 bottles daily       Wears seat belt   Smoke detectors in home   Does not use phone while driving            Social Determinants of Health   Financial Resource Strain: Medium Risk  . Difficulty of Paying Living Expenses: Somewhat hard  Food Insecurity: No Food Insecurity  . Worried About Charity fundraiser in the Last Year: Never true  . Ran Out of Food in the Last Year: Never true  Transportation Needs: No Transportation Needs  . Lack of Transportation (Medical): No  . Lack of Transportation (Non-Medical): No  Physical Activity: Insufficiently Active  . Days of Exercise per Week: 3 days  . Minutes of Exercise per Session: 20 min  Stress: No Stress Concern Present  . Feeling of Stress : Not at all  Social Connections: Moderately Isolated  . Frequency of Communication with Friends and Family: More than three times a week  . Frequency of Social Gatherings with Friends and Family: More than three times a week  . Attends Religious Services: More than 4 times per year  . Active Member of Clubs or Organizations: No  . Attends Archivist Meetings:  Never  . Marital Status: Widowed  Intimate Partner Violence: Not At Risk  . Fear of Current or Ex-Partner: No  . Emotionally Abused: No  . Physically Abused: No  . Sexually Abused: No   Family History  Problem Relation Age of Onset  . Colon cancer Mother        In her 7s  . Breast cancer Sister   . Stomach cancer Maternal Aunt   . Healthy Brother   . Stroke Paternal Grandmother   . Heart attack Paternal Grandmother  OBJECTIVE:  Vitals:   09/08/20 1256 09/08/20 1304  BP: (!) 200/74   Pulse: 73   Resp: 16   Temp: 98.1 F (36.7 C)   TempSrc: Oral   SpO2: 94%   Weight:  162 lb (73.5 kg)  Height:  5' (1.524 m)    General appearance: ALERT; in no acute distress.  Head: NCAT Lungs: Normal respiratory effort CV: pulses 2+ bilaterally. Cap refill < 2 seconds Musculoskeletal:  Inspection: Skin warm, dry, clear and intact No erythema, effusion to right thumb or right hip Palpation: PIP joint of right thumb as well as right hip tender to palpation ROM: Limited ROM active and passive to right thumb and right hip Skin: warm and dry Neurologic: Ambulates without difficulty; Sensation intact about the upper/ lower extremities Psychological: alert and cooperative; normal mood and affect  DIAGNOSTIC STUDIES:  No results found.   ASSESSMENT & PLAN:  1. Chronic pain of right thumb   2. Encounter for screening for COVID-19   3. Right hip pain   4. CMC arthritis right thumb   5. Essential hypertension   6. Nonintractable headache, unspecified chronicity pattern, unspecified headache type      Meds ordered this encounter  Medications  . meloxicam (MOBIC) 7.5 MG tablet    Sig: Take 1 tablet (7.5 mg total) by mouth daily.    Dispense:  30 tablet    Refill:  2    Order Specific Question:   Supervising Provider    Answer:   Chase Picket [id number]   Prescribed meloxicam Do not take ibuprofen with this medication May take Tylenol with this medication as  needed Continue conservative management of rest, ice, and gentle stretches Follow up with hand specialist if symptoms persist  Covid swab obtained in office today.   Patient instructed to quarantine until results are back and negative.   If results are negative, patient may resume daily schedule as tolerated once they are fever free for 24 hours without the use of antipyretic medications.   If results are positive, patient instructed to quarantine for at least 5 days from symptom onset.  If after 5 days symptoms have resolved, may return to work with a well fitting mask for the next 5 days. If symptomatic after day 5, isolation should be extended to 10 days. Patient instructed to follow-up with primary care or with this office as needed.   Patient instructed to follow-up in the ER for trouble swallowing, trouble breathing, other concerning symptoms.    Faustino Congress, NP 09/08/20 1708

## 2020-09-08 NOTE — Discharge Instructions (Signed)
I have sent in meloxicam for you take daily for thumb and hip  Follow up with this office or with primary care if symptoms are persisting.  Follow up in the ER for high fever, trouble swallowing, trouble breathing, other concerning symptoms.

## 2020-09-08 NOTE — ED Triage Notes (Signed)
Pain to RT hip that is chronic on and off.  Also reports pain to under RT thumb area that is chronic all the time , pcp recommended some cream that helps for about 30 min.  Pt also wants covid test for headache.

## 2020-09-10 LAB — NOVEL CORONAVIRUS, NAA: SARS-CoV-2, NAA: NOT DETECTED

## 2020-09-10 LAB — SARS-COV-2, NAA 2 DAY TAT

## 2020-09-15 ENCOUNTER — Other Ambulatory Visit: Payer: Self-pay

## 2020-09-15 ENCOUNTER — Ambulatory Visit (INDEPENDENT_AMBULATORY_CARE_PROVIDER_SITE_OTHER): Payer: Medicare Other | Admitting: Obstetrics & Gynecology

## 2020-09-15 ENCOUNTER — Encounter: Payer: Self-pay | Admitting: Obstetrics & Gynecology

## 2020-09-15 ENCOUNTER — Inpatient Hospital Stay (HOSPITAL_BASED_OUTPATIENT_CLINIC_OR_DEPARTMENT_OTHER): Payer: Medicare Other | Admitting: Hematology

## 2020-09-15 VITALS — BP 167/80 | HR 70 | Ht 60.0 in | Wt 167.0 lb

## 2020-09-15 VITALS — HR 69 | Temp 97.1°F | Resp 18 | Wt 166.6 lb

## 2020-09-15 DIAGNOSIS — Z8616 Personal history of COVID-19: Secondary | ICD-10-CM | POA: Diagnosis not present

## 2020-09-15 DIAGNOSIS — D509 Iron deficiency anemia, unspecified: Secondary | ICD-10-CM | POA: Diagnosis not present

## 2020-09-15 DIAGNOSIS — R5383 Other fatigue: Secondary | ICD-10-CM | POA: Diagnosis not present

## 2020-09-15 DIAGNOSIS — Z8249 Family history of ischemic heart disease and other diseases of the circulatory system: Secondary | ICD-10-CM | POA: Diagnosis not present

## 2020-09-15 DIAGNOSIS — Z87891 Personal history of nicotine dependence: Secondary | ICD-10-CM | POA: Diagnosis not present

## 2020-09-15 DIAGNOSIS — I10 Essential (primary) hypertension: Secondary | ICD-10-CM | POA: Diagnosis not present

## 2020-09-15 DIAGNOSIS — Z800 Family history of malignant neoplasm of digestive organs: Secondary | ICD-10-CM | POA: Diagnosis not present

## 2020-09-15 DIAGNOSIS — Z79899 Other long term (current) drug therapy: Secondary | ICD-10-CM | POA: Diagnosis not present

## 2020-09-15 DIAGNOSIS — Z803 Family history of malignant neoplasm of breast: Secondary | ICD-10-CM | POA: Diagnosis not present

## 2020-09-15 DIAGNOSIS — R200 Anesthesia of skin: Secondary | ICD-10-CM | POA: Diagnosis not present

## 2020-09-15 DIAGNOSIS — I252 Old myocardial infarction: Secondary | ICD-10-CM | POA: Diagnosis not present

## 2020-09-15 DIAGNOSIS — I2510 Atherosclerotic heart disease of native coronary artery without angina pectoris: Secondary | ICD-10-CM | POA: Diagnosis not present

## 2020-09-15 DIAGNOSIS — Z01419 Encounter for gynecological examination (general) (routine) without abnormal findings: Secondary | ICD-10-CM

## 2020-09-15 DIAGNOSIS — K5730 Diverticulosis of large intestine without perforation or abscess without bleeding: Secondary | ICD-10-CM | POA: Diagnosis not present

## 2020-09-15 DIAGNOSIS — E782 Mixed hyperlipidemia: Secondary | ICD-10-CM | POA: Diagnosis not present

## 2020-09-15 DIAGNOSIS — Z823 Family history of stroke: Secondary | ICD-10-CM | POA: Diagnosis not present

## 2020-09-15 DIAGNOSIS — K269 Duodenal ulcer, unspecified as acute or chronic, without hemorrhage or perforation: Secondary | ICD-10-CM | POA: Diagnosis not present

## 2020-09-15 MED ORDER — VALACYCLOVIR HCL 500 MG PO TABS: 500.0000 mg | ORAL_TABLET | Freq: Two times a day (BID) | ORAL | 11 refills | Status: DC

## 2020-09-15 NOTE — Progress Notes (Signed)
Subjective:     Nicole Horn is a 75 y.o. female here for a routine exam.  No LMP recorded. Patient has had a hysterectomy. G1P0101 Birth Control Method:  hysterectomy Menstrual Calendar(currently):   Current complaints: .   Current acute medical issues:     Recent Gynecologic History No LMP recorded. Patient has had a hysterectomy. Last Pap: n/a,   Last mammogram: 9/21,  normal  Past Medical History:  Diagnosis Date  . 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 Chronic thumb pain, right   . Essential hypertension   . Heart attack [facility]) 2004   [place] - hospitalized with stress test by report and presumably medical therapy; subsequent cardiac catheterization (no PCI)  . History of COVID-19 08/2019  . History of irregular heartbeat   . History of syphilis   . Mixed hyperlipidemia   . PAD (peripheral artery disease) ([place]) 2019   PTCA/stent [place]  . Pneumonia due to COVID-19 virus 09/10/2019  . Sleep apnea    Pt supposed to wear CPAP, but doesn't.  . Type 2 diabetes mellitus ([place])     Past Surgical History:  Procedure Laterality Date  . ABDOMINAL HYSTERECTOMY    . BACK SURGERY    . BARTHOLIN GLAND CYST EXCISION Left 03/04/2015   Procedure: EXCISION OF LEFT BARTHOLINS TUMOR;  Surgeon: [name], MD;  Location: [facility] OR[facility];  Service: Gynecology;  Laterality: Left;  . CERVICAL SPINE SURGERY    . COLONOSCOPY N/A 02/01/2020   Procedure: COLONOSCOPY;  Surgeon: Daneil Dolin, MD; Scattered medium mouth diverticula in the sigmoid and descending colon, otherwise normal exam.  . CYST REMOVAL TRUNK    . ESOPHAGOGASTRODUODENOSCOPY (EGD) WITH PROPOFOL N/A 03/27/2020   Procedure: ESOPHAGOGASTRODUODENOSCOPY (EGD) WITH PROPOFOL;  Surgeon: Daneil Dolin, MD;  Location: [facility];  Service: Endoscopy;  Laterality: N/A;  1:00pm  . GIVENS CAPSULE STUDY N/A 03/27/2020   Procedure: GIVENS CAPSULE STUDY;  Surgeon: Daneil Dolin, MD;  Location: [facility];  Service:  Endoscopy;  Laterality: N/A;  . Multiple cyst removal surgeries    . Stent of lower extremities      OB History    Gravida  1   Para  1   Term      Preterm  1   AB      Living  1     SAB      IAB      Ectopic      Multiple      Live Births  1           Social History   Socioeconomic History  . Marital status: Widowed    Spouse name: Not on file  . Number of children: 1  . Years of education: Not on file  . Highest education level: Some college, no degree  Occupational History  . Not on file  Tobacco Use  . Smoking status: Former Smoker    Packs/day: 2.00    Years: 25.00    Pack years: 50.00    Types: Cigarettes    Quit date: 03/10/1985    Years since quitting: 35.5  . Smokeless tobacco: Never Used  Vaping Use  . Vaping Use: Never used  Substance and Sexual Activity  . Alcohol use: Yes    Alcohol/week: 0.0 standard drinks    Comment: socially  . Drug use: No  . Sexual activity: Yes    Birth control/protection: Surgical  Other Topics Concern  . Not  on file  Social History Narrative   Lives in [place] and [place] 6 months here and there owns an apt in [place] alone, but helps to care for mother who is 27 years (goes between the kids)   Sister is in [place] is in [place]      Son who is 68 years old lives in [place], [place]    Has 6 grand kids and 7 great grands      Enjoys exercise (harder since COVID)-dancing      Diet: eats all foods-enjoys smoothies, avoids fried foods, limited red meat if any   Caffeine: coffee 1 cup in the morning-sometimes will have 1 in evening    Water: 3-4 bottles daily       Wears seat belt   Smoke detectors in home   Does not use phone while driving            Social Determinants of Health   Financial Resource Strain: Medium Risk  . Difficulty of Paying Living Expenses: Somewhat hard  Food Insecurity: Food Insecurity Present  . Worried About Charity fundraiser in the Last Year:  Never true  . Ran Out of Food in the Last Year: Sometimes true  Transportation Needs: No Transportation Needs  . Lack of Transportation (Medical): No  . Lack of Transportation (Non-Medical): No  Physical Activity: Inactive  . Days of Exercise per Week: 0 days  . Minutes of Exercise per Session: 0 min  Stress: Stress Concern Present  . Feeling of Stress : To some extent  Social Connections: Moderately Integrated  . Frequency of Communication with Friends and Family: More than three times a week  . Frequency of Social Gatherings with Friends and Family: Three times a week  . Attends Religious Services: More than 4 times per year  . Active Member of Clubs or Organizations: Yes  . Attends Archivist Meetings: 1 to 4 times per year  . Marital Status: Widowed    Family History  Problem Relation Age of Onset  . Colon cancer Mother        In her 23s  . Breast cancer Sister   . Stomach cancer Maternal Aunt   . Healthy Brother   . Stroke Paternal Grandmother   . Heart attack Paternal Grandmother      Current Outpatient Medications:  .  albuterol (VENTOLIN HFA) 108 (90 Base) MCG/ACT inhaler, Inhale 2 puffs into the lungs every 4 (four) hours as needed for wheezing or shortness of breath., Disp: , Rfl:  .  amLODipine (NORVASC) 10 MG tablet, Take 1 tablet (10 mg total) by mouth daily., Disp: 90 tablet, Rfl: 1 .  ASPIRIN 81 PO, Take 81 mg by mouth daily., Disp: , Rfl:  .  Calcium Carb-Cholecalciferol (CALTRATE 600+D3) 600-800 MG-UNIT TABS, Take 2 tablets by mouth 3 (three) times daily after meals., Disp: , Rfl:  .  meloxicam (MOBIC) 7.5 MG tablet, Take 1 tablet (7.5 mg total) by mouth daily., Disp: 30 tablet, Rfl: 2 .  Multiple Vitamin (MULTIVITAMIN WITH MINERALS) TABS tablet, Take 1 tablet by mouth daily., Disp: , Rfl:  .  olmesartan-hydrochlorothiazide (BENICAR HCT) 20-12.5 MG tablet, Take 1 tablet by mouth daily., Disp: 90 tablet, Rfl: 1 .  rosuvastatin (CRESTOR) 40 MG tablet,  Take 1 tablet (40 mg total) by mouth daily., Disp: 90 tablet, Rfl: 1 .  valACYclovir (VALTREX) 500 MG tablet, Take 1 tablet (500 mg total) by mouth 2 (two)  times daily., Disp: 20 tablet, Rfl: 11  Review of Systems  Review of Systems  Constitutional: Negative for fever, chills, weight loss, malaise/fatigue and diaphoresis.  HENT: Negative for hearing loss, ear pain, nosebleeds, congestion, sore throat, neck pain, tinnitus and ear discharge.   Eyes: Negative for blurred vision, double vision, photophobia, pain, discharge and redness.  Respiratory: Negative for cough, hemoptysis, sputum production, shortness of breath, wheezing and stridor.   Cardiovascular: Negative for chest pain, palpitations, orthopnea, claudication, leg swelling and PND.  Gastrointestinal: negative for abdominal pain. Negative for heartburn, nausea, vomiting, diarrhea, constipation, blood in stool and melena.  Genitourinary: Negative for dysuria, urgency, frequency, hematuria and flank pain.  Musculoskeletal: Negative for myalgias, back pain, joint pain and falls.  Skin: Negative for itching and rash.  Neurological: Negative for dizziness, tingling, tremors, sensory change, speech change, focal weakness, seizures, loss of consciousness, weakness and headaches.  Endo/Heme/Allergies: Negative for environmental allergies and polydipsia. Does not bruise/bleed easily.  Psychiatric/Behavioral: Negative for depression, suicidal ideas, hallucinations, memory loss and substance abuse. The patient is not nervous/anxious and does not have insomnia.        Objective:  Blood pressure (!) 167/80, pulse 70, height 5' (1.524 m), weight 167 lb (75.8 kg).   Physical Exam  Vitals reviewed. Constitutional: She is oriented to person, place, and time. She appears well-developed and well-nourished.  HENT:  Head: Normocephalic and atraumatic.        Right Ear: External ear normal.  Left Ear: External ear normal.  Nose: Nose normal.   Mouth/Throat: Oropharynx is clear and moist.  Eyes: Conjunctivae and EOM are normal. Pupils are equal, round, and reactive to light. Right eye exhibits no discharge. Left eye exhibits no discharge. No scleral icterus.  Neck: Normal range of motion. Neck supple. No tracheal deviation present. No thyromegaly present.  Cardiovascular: Normal rate, regular rhythm, normal heart sounds and intact distal pulses.  Exam reveals no gallop and no friction rub.   No murmur heard. Respiratory: Effort normal and breath sounds normal. No respiratory distress. She has no wheezes. She has no rales. She exhibits no tenderness.  GI: Soft. Bowel sounds are normal. She exhibits no distension and no mass. There is no tenderness. There is no rebound and no guarding.  Genitourinary:  Breasts no masses skin changes or nipple changes bilaterally      Vulva is normal without lesions Vagina is pink moist without discharge Cervix absent Uterus is absent Adnexa is negative   Musculoskeletal: Normal range of motion. She exhibits no edema and no tenderness.  Neurological: She is alert and oriented to person, place, and time. She has normal reflexes. She displays normal reflexes. No cranial nerve deficit. She exhibits normal muscle tone. Coordination normal.  Skin: Skin is warm and dry. No rash noted. No erythema. No pallor.  Psychiatric: She has a normal mood and affect. Her behavior is normal. Judgment and thought content normal.       Medications Ordered at today's visit: Meds ordered this encounter  Medications  . valACYclovir (VALTREX) 500 MG tablet    Sig: Take 1 tablet (500 mg total) by mouth 2 (two) times daily.    Dispense:  20 tablet    Refill:  11    Other orders placed at today's visit: No orders of the defined types were placed in this encounter.     Assessment:    Normal Gyn exam.    Plan:    Contraception: status post hysterectomy. Mammogram ordered. Follow up in:  3 years.     No  follow-ups on file.

## 2020-09-15 NOTE — Patient Instructions (Signed)
[facility] at [facility] Discharge Instructions  You were seen today by Dr. Katragadda. He went over your recent results. Dr. Katragadda will see you back in 3 months for labs and follow up.   Thank you for choosing [facility] at [facility] to provide your oncology and hematology care.  To afford each patient quality time with our provider, please arrive at least 15 minutes before your scheduled appointment time.   If you have a lab appointment with the Cancer Center please come in thru the Main Entrance and check in at the main information desk  You need to re-schedule your appointment should you arrive 10 or more minutes late.  We strive to give you quality time with our providers, and arriving late affects you and other patients whose appointments are after yours.  Also, if you no show three or more times for appointments you may be dismissed from the clinic at the providers discretion.     Again, thank you for choosing [facility].  Our hope is that these requests will decrease the amount of time that you wait before being seen by our physicians.       _____________________________________________________________  Should you have questions after your visit to [facility], please contact our office at [phone] between the hours of 8:00 a.m. and 4:30 p.m.  Voicemails left after 4:00 p.m. will not be returned until the following business day.  For prescription refill requests, have your pharmacy contact our office and allow 72 hours.    Cancer Center Support Programs:   > Cancer Support Group  2nd Tuesday of the month 1pm-2pm, Journey Room   

## 2020-09-15 NOTE — Progress Notes (Signed)
Nicole Horn, [place] [address]   CLINIC:  Medical Oncology/Hematology  PCP:  [name], NP [place] / [place] [address]  [phone]  REASON FOR VISIT:  Follow-up for microcytic anemia  PRIOR THERAPY: Intermittent Feraheme last on 05/09/2020  CURRENT THERAPY: Iron tablet daily  INTERVAL HISTORY:  Nicole Horn, a 75 y.o. female, returns for routine follow-up for her microcytic anemia. Nicole Horn was last seen on 06/09/2020.  Today she reports feeling well. She is taking an iron tablet daily and is tolerating it well; she denies having N/V/D/C. Her energy levels alternate day by day. She denies having any nosebleeds, hematuria or hematochezia.   REVIEW OF SYSTEMS:  Review of Systems  Constitutional: Positive for fatigue (50%). Negative for appetite change.  HENT:   Negative for nosebleeds.   Gastrointestinal: Negative for blood in stool, constipation, diarrhea, nausea and vomiting.  Genitourinary: Negative for hematuria.   Neurological: Positive for numbness (hands).  All other systems reviewed and are negative.   PAST MEDICAL/SURGICAL HISTORY:  Past Medical History:  Diagnosis Date  . 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 Chronic thumb pain, right   . Essential hypertension   . Heart attack [facility]) 2004   [place] - hospitalized with stress test by report and presumably medical therapy; subsequent cardiac catheterization (no PCI)  . History of COVID-19 08/2019  . History of irregular heartbeat   . History of syphilis   . Mixed hyperlipidemia   . PAD (peripheral artery disease) ([place]) 2019   PTCA/stent [place]  . Pneumonia due to COVID-19 virus 09/10/2019  . Sleep apnea    Pt supposed to wear CPAP, but doesn't.  . Type 2 diabetes mellitus ([place])    Past Surgical History:  Procedure Laterality Date  . ABDOMINAL HYSTERECTOMY    . BACK SURGERY    . BARTHOLIN GLAND CYST EXCISION Left 03/04/2015    Procedure: EXCISION OF LEFT BARTHOLINS TUMOR;  Surgeon: [name], MD;  Location: [facility];  Service: Gynecology;  Laterality: Left;  . CERVICAL SPINE SURGERY    . COLONOSCOPY N/A 02/01/2020   Procedure: COLONOSCOPY;  Surgeon: Daneil Dolin, MD; Scattered medium mouth diverticula in the sigmoid and descending colon, otherwise normal exam.  . CYST REMOVAL TRUNK    . ESOPHAGOGASTRODUODENOSCOPY (EGD) WITH PROPOFOL N/A 03/27/2020   Procedure: ESOPHAGOGASTRODUODENOSCOPY (EGD) WITH PROPOFOL;  Surgeon: Daneil Dolin, MD;  Location: [facility];  Service: Endoscopy;  Laterality: N/A;  1:00pm  . GIVENS CAPSULE STUDY N/A 03/27/2020   Procedure: GIVENS CAPSULE STUDY;  Surgeon: Daneil Dolin, MD;  Location: [facility];  Service: Endoscopy;  Laterality: N/A;  . Multiple cyst removal surgeries    . Stent of lower extremities      SOCIAL HISTORY:  Social History   Socioeconomic History  . Marital status: Widowed    Spouse name: Not on file  . Number of children: 1  . Years of education: Not on file  . Highest education level: Some college, no degree  Occupational History  . Not on file  Tobacco Use  . Smoking status: Former Smoker    Packs/day: 2.00    Years: 25.00    Pack years: 50.00    Types: Cigarettes    Quit date: 03/10/1985    Years since quitting: 35.5  . Smokeless tobacco: Never Used  Vaping Use  . Vaping Use: Never used  Substance and Sexual Activity  .  Alcohol use: Yes    Alcohol/week: 0.0 standard drinks    Comment: socially  . Drug use: No  . Sexual activity: Yes    Birth control/protection: Surgical  Other Topics Concern  . Not on file  Social History Narrative   Lives in [place] and [place] 6 months here and there owns an apt in [place] alone, but helps to care for mother who is 51 years (goes between the kids)   Sister is in [place] is in [place]      Son who is 77 years old lives in [place], [place]    Has 6 grand kids and 7 great  grands      Enjoys exercise (harder since COVID)-dancing      Diet: eats all foods-enjoys smoothies, avoids fried foods, limited red meat if any   Caffeine: coffee 1 cup in the morning-sometimes will have 1 in evening    Water: 3-4 bottles daily       Wears seat belt   Smoke detectors in home   Does not use phone while driving            Social Determinants of Health   Financial Resource Strain: Medium Risk  . Difficulty of Paying Living Expenses: Somewhat hard  Food Insecurity: Food Insecurity Present  . Worried About Charity fundraiser in the Last Year: Never true  . Ran Out of Food in the Last Year: Sometimes true  Transportation Needs: No Transportation Needs  . Lack of Transportation (Medical): No  . Lack of Transportation (Non-Medical): No  Physical Activity: Inactive  . Days of Exercise per Week: 0 days  . Minutes of Exercise per Session: 0 min  Stress: Stress Concern Present  . Feeling of Stress : To some extent  Social Connections: Moderately Integrated  . Frequency of Communication with Friends and Family: More than three times a week  . Frequency of Social Gatherings with Friends and Family: Three times a week  . Attends Religious Services: More than 4 times per year  . Active Member of Clubs or Organizations: Yes  . Attends Archivist Meetings: 1 to 4 times per year  . Marital Status: Widowed  Intimate Partner Violence: Not At Risk  . Fear of Current or Ex-Partner: No  . Emotionally Abused: No  . Physically Abused: No  . Sexually Abused: No    FAMILY HISTORY:  Family History  Problem Relation Age of Onset  . Colon cancer Mother        In her 54s  . Breast cancer Sister   . Stomach cancer Maternal Aunt   . Healthy Brother   . Stroke Paternal Grandmother   . Heart attack Paternal Grandmother     CURRENT MEDICATIONS:  Current Outpatient Medications  Medication Sig Dispense Refill  . albuterol (VENTOLIN HFA) 108 (90 Base) MCG/ACT inhaler  Inhale 2 puffs into the lungs every 4 (four) hours as needed for wheezing or shortness of breath.    Marland Kitchen amLODipine (NORVASC) 10 MG tablet Take 1 tablet (10 mg total) by mouth daily. 90 tablet 1  . ASPIRIN 81 PO Take 81 mg by mouth daily.    . Calcium Carb-Cholecalciferol (CALTRATE 600+D3) 600-800 MG-UNIT TABS Take 2 tablets by mouth 3 (three) times daily after meals.    . meloxicam (MOBIC) 7.5 MG tablet Take 1 tablet (7.5 mg total) by mouth daily. 30 tablet 2  . Multiple Vitamin (MULTIVITAMIN WITH MINERALS) TABS tablet  Take 1 tablet by mouth daily.    Marland Kitchen olmesartan-hydrochlorothiazide (BENICAR HCT) 20-12.5 MG tablet Take 1 tablet by mouth daily. 90 tablet 1  . rosuvastatin (CRESTOR) 40 MG tablet Take 1 tablet (40 mg total) by mouth daily. 90 tablet 1  . valACYclovir (VALTREX) 500 MG tablet Take 1 tablet (500 mg total) by mouth 2 (two) times daily. 20 tablet 11   No current facility-administered medications for this visit.    ALLERGIES:  No Known Allergies  PHYSICAL EXAM:  Performance status (ECOG): 1 - Symptomatic but completely ambulatory  Vitals:   09/15/20 1500  Pulse: 69  Resp: 18  Temp: (!) 97.1 F (36.2 C)  SpO2: 97%   Wt Readings from Last 3 Encounters:  09/15/20 166 lb 9.6 oz (75.6 kg)  09/15/20 167 lb (75.8 kg)  09/08/20 162 lb (73.5 kg)   Physical Exam Vitals reviewed.  Constitutional:      Appearance: Normal appearance.  Neurological:     General: No focal deficit present.     Mental Status: She is alert and oriented to person, place, and time.  Psychiatric:        Mood and Affect: Mood normal.        Behavior: Behavior normal.     LABORATORY DATA:  I have reviewed the labs as listed.  CBC Latest Ref Rng & Units 09/04/2020 06/06/2020 04/24/2020  WBC 4.0 - 10.5 K/uL 4.9 4.3 6.4  Hemoglobin 12.0 - 15.0 g/dL 14.4 12.9 10.6(L)  Hematocrit 36.0 - 46.0 % 45.5 41.4 37.0  Platelets 150 - 400 K/uL 253 240 321   CMP Latest Ref Rng & Units 04/24/2020 04/16/2020 03/15/2020   Glucose 70 - 99 mg/dL 81 94 98  BUN 8 - 23 mg/dL 15 14 17   Creatinine 0.44 - 1.00 mg/dL 1.05(H) 1.05(H) 1.16(H)  Sodium 135 - 145 mmol/L 142 143 144  Potassium 3.5 - 5.1 mmol/L 3.6 4.1 3.7  Chloride 98 - 111 mmol/L 107 105 111  CO2 22 - 32 mmol/L 26 25 26   Calcium 8.9 - 10.3 mg/dL 9.8 9.9 8.8(L)  Total Protein 6.5 - 8.1 g/dL 7.9 7.2 -  Total Bilirubin 0.3 - 1.2 mg/dL 0.4 0.3 -  Alkaline Phos 38 - 126 U/L 70 85 -  AST 15 - 41 U/L 28 25 -  ALT 0 - 44 U/L 26 22 -      Component Value Date/Time   RBC 5.29 (H) 09/04/2020 1446   MCV 86.0 09/04/2020 1446   MCV 70 (L) 04/16/2020 1141   MCH 27.2 09/04/2020 1446   MCHC 31.6 09/04/2020 1446   RDW 14.2 09/04/2020 1446   RDW 24.9 (H) 04/16/2020 1141   LYMPHSABS 2.1 09/04/2020 1446   MONOABS 0.4 09/04/2020 1446   EOSABS 0.1 09/04/2020 1446   BASOSABS 0.0 09/04/2020 1446   Lab Results  Component Value Date   TIBC 353 09/04/2020   TIBC 342 06/06/2020   TIBC 531 (H) 04/24/2020   FERRITIN 179 09/04/2020   FERRITIN 390 (H) 06/06/2020   FERRITIN 7 (L) 04/24/2020   IRONPCTSAT 22 09/04/2020   IRONPCTSAT 25 06/06/2020   IRONPCTSAT 7 (L) 04/24/2020    DIAGNOSTIC IMAGING:  I have independently reviewed the scans and discussed with the patient. No results found.   ASSESSMENT:  1. Microcytic anemia: -CBC on 04/16/2020 shows hemoglobin 10.2 with MCV of 70. -Recent drop in hemoglobin to 6 and MCV to 64.7 on 03/14/2020, status post 2 units PRBC transfusion. Ferritin was 3 with normal folic  acid and B12. -Colonoscopy on 02/01/2020 shows diverticulosis in the sigmoid colon and the descending colon. -EGD on 03/27/2020 shows normal esophagus, normal stomach, 2 small duodenal erosions. -CTAP on 03/15/2020 shows normal liver and normal-sized spleen. -She was taking iron tablet daily for 4 months prior to hospitalization and transfusion. -We will check for nutritional deficiencies which were negative. -SPEP was negative. -Feraheme on 05/02/2020 and  05/09/2020.  2. Family history: -Sister had breast cancer. Mother had colon cancer. Maternal aunt had stomach cancer.   PLAN:  1. Microcytic anemia: -She reports some drop in her energy levels. She is taking iron tablet daily. -Reviewed labs from 09/04/2020. Hemoglobin improved to 14.4. Ferritin decreased to 179 with percent saturation of 22. -Parenteral iron therapy is not recommended at this time. Continue iron tablet daily. RTC 3 months with repeat labs.  2.  Health maintenance: -Mammogram on 05/01/2020 was negative.  Orders placed this encounter:  No orders of the defined types were placed in this encounter.    Derek Jack, MD [place] [phone]   I, Milinda Antis, am acting as a scribe for Dr. Sanda Linger.  I, Derek Jack MD, have reviewed the above documentation for accuracy and completeness, and I agree with the above.

## 2020-10-15 ENCOUNTER — Ambulatory Visit: Payer: Medicare Other | Admitting: Family Medicine

## 2020-10-20 ENCOUNTER — Other Ambulatory Visit: Payer: Self-pay | Admitting: Family Medicine

## 2020-10-22 ENCOUNTER — Other Ambulatory Visit: Payer: Self-pay

## 2020-10-22 ENCOUNTER — Ambulatory Visit (INDEPENDENT_AMBULATORY_CARE_PROVIDER_SITE_OTHER): Payer: Medicare Other | Admitting: Nurse Practitioner

## 2020-10-22 ENCOUNTER — Encounter: Payer: Self-pay | Admitting: Nurse Practitioner

## 2020-10-22 DIAGNOSIS — G4700 Insomnia, unspecified: Secondary | ICD-10-CM

## 2020-10-22 DIAGNOSIS — F322 Major depressive disorder, single episode, severe without psychotic features: Secondary | ICD-10-CM | POA: Diagnosis not present

## 2020-10-22 MED ORDER — SERTRALINE HCL 50 MG PO TABS: 50.0000 mg | ORAL_TABLET | Freq: Every day | ORAL | 3 refills | Status: DC

## 2020-10-22 MED ORDER — TRAZODONE HCL 50 MG PO TABS
25.0000 mg | ORAL_TABLET | Freq: Every evening | ORAL | 3 refills | Status: DC | PRN
Start: 2020-10-22 — End: 2021-03-27

## 2020-10-22 NOTE — Progress Notes (Signed)
Acute Office Visit  Subjective:    Patient ID: Nicole Horn, female    DOB: Oct 25, 1945, 75 y.o.   MRN: [id number]  Chief Complaint  Patient presents with  . Anxiety    Follow up     HPI Patient is in today for follow-up for stress. At her last visit she reported forgetfulness, but her MMSE was 29/30.  She has caregiver role strain related to taking care of her mother who has dementia.  She is the only sibling that takes care of her mother, and she takes care of her own responsibilities as well as the financial responsibilities of her mother. For her role strain, she was referred to psychology. Jarrett Soho also gave her the suggestion of using a planner and writing in 2 colors (one for her agenda items and 1 for her mother's), looking into adult daycare, and asking her siblings for help.  She states that she has been down and been hard on herself lately.  She has right wrist pain, and she states that she has been using diclofenac cream.  This works temporarily, but hasn't fixed anything. She has seen orthopedics and a hand surgeon. She needs to make a f/u appointment. She is talking to therapy every 2 weeks.  Past Medical History:  Diagnosis Date  . 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 Chronic thumb pain, right   . Essential hypertension   . Heart attack [facility]) 2004   [place] - hospitalized with stress test by report and presumably medical therapy; subsequent cardiac catheterization (no PCI)  . History of COVID-19 08/2019  . History of irregular heartbeat   . History of syphilis   . Mixed hyperlipidemia   . PAD (peripheral artery disease) ([place]) 2019   PTCA/stent [place]  . Pneumonia due to COVID-19 virus 09/10/2019  . Sleep apnea    Pt supposed to wear CPAP, but doesn't.  . Type 2 diabetes mellitus ([place])     Past Surgical History:  Procedure Laterality Date  . ABDOMINAL HYSTERECTOMY    . BACK SURGERY    . BARTHOLIN GLAND CYST EXCISION Left 03/04/2015    Procedure: EXCISION OF LEFT BARTHOLINS TUMOR;  Surgeon: [name], MD;  Location: [facility];  Service: Gynecology;  Laterality: Left;  . CERVICAL SPINE SURGERY    . COLONOSCOPY N/A 02/01/2020   Procedure: COLONOSCOPY;  Surgeon: Daneil Dolin, MD; Scattered medium mouth diverticula in the sigmoid and descending colon, otherwise normal exam.  . CYST REMOVAL TRUNK    . ESOPHAGOGASTRODUODENOSCOPY (EGD) WITH PROPOFOL N/A 03/27/2020   Procedure: ESOPHAGOGASTRODUODENOSCOPY (EGD) WITH PROPOFOL;  Surgeon: Daneil Dolin, MD;  Location[facility];  Service: Endoscopy;  Laterality: N/A;  1:00pm  . GIVENS CAPSULE STUDY N/A 03/27/2020   Procedure: GIVENS CAPSULE STUDY;  Surgeon: Daneil Dolin, MD;  [facility];  Service: Endoscopy;  Laterality: N/A;  . Multiple cyst removal surgeries    . Stent of lower extremities      Family History  Problem Relation Age of Onset  . Colon cancer Mother        In her 77s  . Breast cancer Sister   . Stomach cancer Maternal Aunt   . Healthy Brother   . Stroke Paternal Grandmother   . Heart attack Paternal Grandmother     Social History   Socioeconomic History  . Marital status: Widowed    Spouse name: Not on file  . Number of children: 1  . Years  of education: Not on file  . Highest education level: Some college, no degree  Occupational History  . Not on file  Tobacco Use  . Smoking status: Former Smoker    Packs/day: 2.00    Years: 25.00    Pack years: 50.00    Types: Cigarettes    Quit date: 03/10/1985    Years since quitting: 35.6  . Smokeless tobacco: Never Used  Vaping Use  . Vaping Use: Never used  Substance and Sexual Activity  . Alcohol use: Yes    Alcohol/week: 0.0 standard drinks    Comment: socially  . Drug use: No  . Sexual activity: Yes    Birth control/protection: Surgical  Other Topics Concern  . Not on file  Social History Narrative   Lives in [place] and [place] 6 months here and there owns an apt  in [place] alone, but helps to care for mother who is 75 years (goes between the kids)   Sister is in [place] is in [place]      Son who is 65 years old lives in [place], [place]    Has 6 grand kids and 7 great grands      Enjoys exercise (harder since COVID)-dancing      Diet: eats all foods-enjoys smoothies, avoids fried foods, limited red meat if any   Caffeine: coffee 1 cup in the morning-sometimes will have 1 in evening    Water: 3-4 bottles daily       Wears seat belt   Smoke detectors in home   Does not use phone while driving            Social Determinants of Health   Financial Resource Strain: Medium Risk  . Difficulty of Paying Living Expenses: Somewhat hard  Food Insecurity: Food Insecurity Present  . Worried About Charity fundraiser in the Last Year: Never true  . Ran Out of Food in the Last Year: Sometimes true  Transportation Needs: No Transportation Needs  . Lack of Transportation (Medical): No  . Lack of Transportation (Non-Medical): No  Physical Activity: Inactive  . Days of Exercise per Week: 0 days  . Minutes of Exercise per Session: 0 min  Stress: Stress Concern Present  . Feeling of Stress : To some extent  Social Connections: Moderately Integrated  . Frequency of Communication with Friends and Family: More than three times a week  . Frequency of Social Gatherings with Friends and Family: Three times a week  . Attends Religious Services: More than 4 times per year  . Active Member of Clubs or Organizations: Yes  . Attends Archivist Meetings: 1 to 4 times per year  . Marital Status: Widowed  Intimate Partner Violence: Not At Risk  . Fear of Current or Ex-Partner: No  . Emotionally Abused: No  . Physically Abused: No  . Sexually Abused: No    Outpatient Medications Prior to Visit  Medication Sig Dispense Refill  . albuterol (VENTOLIN HFA) 108 (90 Base) MCG/ACT inhaler Inhale 2 puffs into the lungs every 4 (four) hours as  needed for wheezing or shortness of breath.    Marland Kitchen amLODipine (NORVASC) 10 MG tablet Take 1 tablet (10 mg total) by mouth daily. 90 tablet 1  . ASPIRIN 81 PO Take 81 mg by mouth daily.    . Calcium Carb-Cholecalciferol (CALTRATE 600+D3) 600-800 MG-UNIT TABS Take 2 tablets by mouth 3 (three) times daily after meals.    Marland Kitchen  Multiple Vitamin (MULTIVITAMIN WITH MINERALS) TABS tablet Take 1 tablet by mouth daily.    Marland Kitchen olmesartan-hydrochlorothiazide (BENICAR HCT) 20-12.5 MG tablet Take 1 tablet by mouth daily. 90 tablet 1  . rosuvastatin (CRESTOR) 40 MG tablet TAKE 1 TABLET BY MOUTH EVERY DAY 90 tablet 1  . valACYclovir (VALTREX) 500 MG tablet Take 1 tablet (500 mg total) by mouth 2 (two) times daily. 20 tablet 11   No facility-administered medications prior to visit.    No Known Allergies  Review of Systems  Constitutional: Negative.   Respiratory: Negative.   Cardiovascular: Negative.   Psychiatric/Behavioral: Positive for dysphoric mood. Negative for self-injury and suicidal ideas.       Objective:    Physical Exam Constitutional:      Appearance: Normal appearance.  Cardiovascular:     Rate and Rhythm: Normal rate and regular rhythm.     Pulses: Normal pulses.     Heart sounds: Normal heart sounds.  Pulmonary:     Effort: Pulmonary effort is normal.     Breath sounds: Normal breath sounds.  Neurological:     Mental Status: She is alert.  Psychiatric:        Mood and Affect: Mood normal.        Behavior: Behavior normal.        Thought Content: Thought content normal.        Judgment: Judgment normal.     Comments: PHQ-9 = 15     BP (!) 150/81   Pulse 81   Temp 98.9 F (37.2 C)   Resp 18   Ht 5' (1.524 m)   Wt 162 lb (73.5 kg)   SpO2 92%   BMI 31.64 kg/m  Wt Readings from Last 3 Encounters:  10/22/20 162 lb (73.5 kg)  09/15/20 166 lb 9.6 oz (75.6 kg)  09/15/20 167 lb (75.8 kg)    Health Maintenance Due  Topic Date Due  . PNA vac Low Risk Adult (1 of 2 - PCV13)  Never done  . COVID-19 Vaccine (3 - Booster for Moderna series) 06/05/2020    There are no preventive care reminders to display for this patient.   No results found for: TSH Lab Results  Component Value Date   WBC 4.9 09/04/2020   HGB 14.4 09/04/2020   HCT 45.5 09/04/2020   MCV 86.0 09/04/2020   PLT 253 09/04/2020   Lab Results  Component Value Date   NA 142 04/24/2020   K 3.6 04/24/2020   CO2 26 04/24/2020   GLUCOSE 81 04/24/2020   BUN 15 04/24/2020   CREATININE 1.05 (H) 04/24/2020   BILITOT 0.4 04/24/2020   ALKPHOS 70 04/24/2020   AST 28 04/24/2020   ALT 26 04/24/2020   PROT 7.9 04/24/2020   ALBUMIN 4.1 04/24/2020   CALCIUM 9.8 04/24/2020   ANIONGAP 9 04/24/2020   Lab Results  Component Value Date   CHOL 106 12/12/2019   Lab Results  Component Value Date   HDL 46 (L) 12/12/2019   Lab Results  Component Value Date   LDLCALC 43 12/12/2019   Lab Results  Component Value Date   TRIG 85 12/12/2019   Lab Results  Component Value Date   CHOLHDL 2.3 12/12/2019   Lab Results  Component Value Date   HGBA1C 5.7 (H) 12/12/2019       Assessment & Plan:   Problem List Items Addressed This Visit      Other   Depression, major, single episode, severe ([place])    -  has caregiver role strain -recent family loss -Rx. Sertraline       Relevant Medications   sertraline (ZOLOFT) 50 MG tablet   traZODone (DESYREL) 50 MG tablet   Insomnia    -may be related to depression -Rx. trazodone          Meds ordered this encounter  Medications  . sertraline (ZOLOFT) 50 MG tablet    Sig: Take 1 tablet (50 mg total) by mouth daily.    Dispense:  30 tablet    Refill:  3  . traZODone (DESYREL) 50 MG tablet    Sig: Take 0.5-1 tablets (25-50 mg total) by mouth at bedtime as needed for sleep.    Dispense:  30 tablet    Refill:  [place], NP

## 2020-10-22 NOTE — Assessment & Plan Note (Signed)
-  has caregiver role strain -recent family loss -Rx. Sertraline

## 2020-10-22 NOTE — Assessment & Plan Note (Signed)
-  may be related to depression -Rx. trazodone

## 2020-11-10 DIAGNOSIS — H25813 Combined forms of age-related cataract, bilateral: Secondary | ICD-10-CM | POA: Diagnosis not present

## 2020-11-12 ENCOUNTER — Other Ambulatory Visit: Payer: Self-pay | Admitting: Family Medicine

## 2020-11-20 ENCOUNTER — Ambulatory Visit: Payer: Medicare Other | Admitting: Orthopedic Surgery

## 2020-11-20 ENCOUNTER — Encounter: Payer: Self-pay | Admitting: Orthopedic Surgery

## 2020-11-20 ENCOUNTER — Other Ambulatory Visit: Payer: Self-pay

## 2020-11-20 VITALS — BP 165/77 | HR 78 | Ht 60.0 in | Wt 170.0 lb

## 2020-11-20 DIAGNOSIS — M19041 Primary osteoarthritis, right hand: Secondary | ICD-10-CM | POA: Diagnosis not present

## 2020-11-20 DIAGNOSIS — M19049 Primary osteoarthritis, unspecified hand: Secondary | ICD-10-CM

## 2020-11-20 NOTE — Progress Notes (Signed)
Chief Complaint  Patient presents with  . Hand Pain    R/thumb hurting when I move it in certain ways the pain gets worse    75 year old female previously seen several times for osteoarthritis of the right thumb she had an injection on February 12, 2020, December 07, 2019 and July 03, 2020 presents for reevaluation of continued symptoms in the right thumb  Patient has also been braced in the past and taking NSAIDs as well.  Examination of the right thumb shows that she has pain in the [facility] joint grinding weak pinch although not as weak as I thought it might be  She is ready for surgery, I will have to refer her back to Dr. Fredna Dow  Encounter Diagnosis  Name Primary?  . [facility] arthritis right thumb Yes

## 2020-11-20 NOTE — Patient Instructions (Signed)
You can call the hand center/ Dr Daryll Brod to make follow up appointment to discuss surgery of the thumb / [phone].

## 2020-11-26 ENCOUNTER — Other Ambulatory Visit: Payer: Self-pay | Admitting: Family Medicine

## 2020-11-27 ENCOUNTER — Encounter: Payer: Self-pay | Admitting: Nurse Practitioner

## 2020-11-27 ENCOUNTER — Other Ambulatory Visit: Payer: Self-pay

## 2020-11-27 ENCOUNTER — Ambulatory Visit (INDEPENDENT_AMBULATORY_CARE_PROVIDER_SITE_OTHER): Payer: Medicare Other | Admitting: Nurse Practitioner

## 2020-11-27 VITALS — BP 143/72 | HR 80 | Temp 98.4°F | Resp 18 | Ht 60.0 in | Wt 166.0 lb

## 2020-11-27 DIAGNOSIS — R002 Palpitations: Secondary | ICD-10-CM

## 2020-11-27 DIAGNOSIS — I10 Essential (primary) hypertension: Secondary | ICD-10-CM

## 2020-11-27 DIAGNOSIS — G4700 Insomnia, unspecified: Secondary | ICD-10-CM

## 2020-11-27 DIAGNOSIS — F322 Major depressive disorder, single episode, severe without psychotic features: Secondary | ICD-10-CM

## 2020-11-27 DIAGNOSIS — E785 Hyperlipidemia, unspecified: Secondary | ICD-10-CM

## 2020-11-27 NOTE — Assessment & Plan Note (Signed)
-  She stopped sertraline and is doing better; had side effects, felt funny -doing well without meds and just therapy -has 2 more sessions left

## 2020-11-27 NOTE — Progress Notes (Signed)
Acute Office Visit  Subjective:    Patient ID: Nicole Horn, female    DOB: 1945-10-03, 75 y.o.   MRN: [id number]  Chief Complaint  Patient presents with  . Follow-up  . Depression    HPI Patient is in today for depression. She has caregiver role strain d/t taking care of her mother with dementia.  She was referred for counseling.  She has therapy appointment q2weeks.  At her last visit she was started on sertraline for depression and trazodone for sleep. She has not been taking the sertraline.   For hand pain she was referred to ortho and a hand surgeon in the past.   Past Medical History:  Diagnosis Date  . 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 Chronic thumb pain, right   . Essential hypertension   . Heart attack [facility]) 2004   [place] - hospitalized with stress test by report and presumably medical therapy; subsequent cardiac catheterization (no PCI)  . History of COVID-19 08/2019  . History of irregular heartbeat   . History of syphilis   . Mixed hyperlipidemia   . PAD (peripheral artery disease) ([place]) 2019   PTCA/stent [place]  . Pneumonia due to COVID-19 virus 09/10/2019  . Sleep apnea    Pt supposed to wear CPAP, but doesn't.  . Type 2 diabetes mellitus ([place])     Past Surgical History:  Procedure Laterality Date  . ABDOMINAL HYSTERECTOMY    . BACK SURGERY    . BARTHOLIN GLAND CYST EXCISION Left 03/04/2015   Procedure: EXCISION OF LEFT BARTHOLINS TUMOR;  Surgeon: [name], MD;  Location: [facility] OR[facility];  Service: Gynecology;  Laterality: Left;  . CERVICAL SPINE SURGERY    . COLONOSCOPY N/A 02/01/2020   Procedure: COLONOSCOPY;  Surgeon: Daneil Dolin, MD; Scattered medium mouth diverticula in the sigmoid and descending colon, otherwise normal exam.  . CYST REMOVAL TRUNK    . ESOPHAGOGASTRODUODENOSCOPY (EGD) WITH PROPOFOL N/A 03/27/2020   Procedure: ESOPHAGOGASTRODUODENOSCOPY (EGD) WITH PROPOFOL;  Surgeon: Daneil Dolin, MD;  Location: [facility] END[facility]  SU[facility];  Service: Endoscopy;  Laterality: N/A;  1:00pm  . GIVENS CAPSULE STUDY N/A 03/27/2020   Procedure: GIVENS CAPSULE STUDY;  Surgeon: Daneil Dolin, MD;  Location: [facility];  Service: Endoscopy;  Laterality: N/A;  . Multiple cyst removal surgeries    . Stent of lower extremities      Family History  Problem Relation Age of Onset  . Colon cancer Mother        In her 51s  . Breast cancer Sister   . Stomach cancer Maternal Aunt   . Healthy Brother   . Stroke Paternal Grandmother   . Heart attack Paternal Grandmother     Social History   Socioeconomic History  . Marital status: Widowed    Spouse name: Not on file  . Number of children: 1  . Years of education: Not on file  . Highest education level: Some college, no degree  Occupational History  . Not on file  Tobacco Use  . Smoking status: Former Smoker    Packs/day: 2.00    Years: 25.00    Pack years: 50.00    Types: Cigarettes    Quit date: 03/10/1985    Years since quitting: 35.7  . Smokeless tobacco: Never Used  Vaping Use  . Vaping Use: Never used  Substance and Sexual Activity  . Alcohol use: Yes    Alcohol/week: 0.0 standard drinks    Comment:  socially  . Drug use: No  . Sexual activity: Yes    Birth control/protection: Surgical  Other Topics Concern  . Not on file  Social History Narrative   Lives in [place] and [place] 6 months here and there owns an apt in [place] alone, but helps to care for mother who is 79 years (goes between the kids)   Sister is in [place] is in [place]      Son who is 28 years old lives in [place], [place]    Has 6 grand kids and 7 great grands      Enjoys exercise (harder since COVID)-dancing      Diet: eats all foods-enjoys smoothies, avoids fried foods, limited red meat if any   Caffeine: coffee 1 cup in the morning-sometimes will have 1 in evening    Water: 3-4 bottles daily       Wears seat belt   Smoke detectors in home   Does not use  phone while driving            Social Determinants of Health   Financial Resource Strain: Medium Risk  . Difficulty of Paying Living Expenses: Somewhat hard  Food Insecurity: Food Insecurity Present  . Worried About Charity fundraiser in the Last Year: Never true  . Ran Out of Food in the Last Year: Sometimes true  Transportation Needs: No Transportation Needs  . Lack of Transportation (Medical): No  . Lack of Transportation (Non-Medical): No  Physical Activity: Inactive  . Days of Exercise per Week: 0 days  . Minutes of Exercise per Session: 0 min  Stress: Stress Concern Present  . Feeling of Stress : To some extent  Social Connections: Moderately Integrated  . Frequency of Communication with Friends and Family: More than three times a week  . Frequency of Social Gatherings with Friends and Family: Three times a week  . Attends Religious Services: More than 4 times per year  . Active Member of Clubs or Organizations: Yes  . Attends Archivist Meetings: 1 to 4 times per year  . Marital Status: Widowed  Intimate Partner Violence: Not At Risk  . Fear of Current or Ex-Partner: No  . Emotionally Abused: No  . Physically Abused: No  . Sexually Abused: No    Outpatient Medications Prior to Visit  Medication Sig Dispense Refill  . albuterol (VENTOLIN HFA) 108 (90 Base) MCG/ACT inhaler Inhale 2 puffs into the lungs every 4 (four) hours as needed for wheezing or shortness of breath.    Marland Kitchen amLODipine (NORVASC) 10 MG tablet TAKE 1 TABLET BY MOUTH EVERY DAY 90 tablet 1  . ASPIRIN 81 PO Take 81 mg by mouth daily.    . Calcium Carb-Cholecalciferol (CALTRATE 600+D3) 600-800 MG-UNIT TABS Take 2 tablets by mouth 3 (three) times daily after meals.    . clopidogrel (PLAVIX) 75 MG tablet TAKE 1 TABLET BY MOUTH  DAILY 90 tablet 3  . hydrochlorothiazide (HYDRODIURIL) 25 MG tablet TAKE 1 TABLET BY MOUTH  DAILY 90 tablet 3  . Multiple Vitamin (MULTIVITAMIN WITH MINERALS) TABS tablet Take  1 tablet by mouth daily.    Marland Kitchen olmesartan-hydrochlorothiazide (BENICAR HCT) 20-12.5 MG tablet Take 1 tablet by mouth daily. 90 tablet 1  . rosuvastatin (CRESTOR) 40 MG tablet TAKE 1 TABLET BY MOUTH EVERY DAY 90 tablet 1  . traZODone (DESYREL) 50 MG tablet Take 0.5-1 tablets (25-50 mg total) by mouth at bedtime as needed for  sleep. 30 tablet 3  . valACYclovir (VALTREX) 500 MG tablet Take 1 tablet (500 mg total) by mouth 2 (two) times daily. 20 tablet 11  . sertraline (ZOLOFT) 50 MG tablet Take 1 tablet (50 mg total) by mouth daily. (Patient not taking: Reported on 11/27/2020) 30 tablet 3   No facility-administered medications prior to visit.    No Known Allergies  Review of Systems  Constitutional: Negative.   Respiratory: Negative.   Cardiovascular: Positive for palpitations.  Psychiatric/Behavioral: Negative for dysphoric mood, self-injury and suicidal ideas.       Objective:    Physical Exam Constitutional:      Appearance: Normal appearance.  Cardiovascular:     Rate and Rhythm: Normal rate and regular rhythm.     Pulses: Normal pulses.     Heart sounds: Normal heart sounds.  Pulmonary:     Effort: Pulmonary effort is normal.     Breath sounds: Normal breath sounds.  Neurological:     Mental Status: She is alert.  Psychiatric:        Mood and Affect: Mood normal.        Behavior: Behavior normal.        Thought Content: Thought content normal.        Judgment: Judgment normal.     BP (!) 143/72   Pulse 80   Temp 98.4 F (36.9 C)   Resp 18   Ht 5' (1.524 m)   Wt 166 lb (75.3 kg)   SpO2 92%   BMI 32.42 kg/m  Wt Readings from Last 3 Encounters:  11/27/20 166 lb (75.3 kg)  11/20/20 170 lb (77.1 kg)  10/22/20 162 lb (73.5 kg)    Health Maintenance Due  Topic Date Due  . PNA vac Low Risk Adult (1 of 2 - PCV13) Never done  . COVID-19 Vaccine (3 - Booster for Moderna series) 06/05/2020    There are no preventive care reminders to display for this  patient.   No results found for: TSH Lab Results  Component Value Date   WBC 4.9 09/04/2020   HGB 14.4 09/04/2020   HCT 45.5 09/04/2020   MCV 86.0 09/04/2020   PLT 253 09/04/2020   Lab Results  Component Value Date   NA 142 04/24/2020   K 3.6 04/24/2020   CO2 26 04/24/2020   GLUCOSE 81 04/24/2020   BUN 15 04/24/2020   CREATININE 1.05 (H) 04/24/2020   BILITOT 0.4 04/24/2020   ALKPHOS 70 04/24/2020   AST 28 04/24/2020   ALT 26 04/24/2020   PROT 7.9 04/24/2020   ALBUMIN 4.1 04/24/2020   CALCIUM 9.8 04/24/2020   ANIONGAP 9 04/24/2020   Lab Results  Component Value Date   CHOL 106 12/12/2019   Lab Results  Component Value Date   HDL 46 (L) 12/12/2019   Lab Results  Component Value Date   LDLCALC 43 12/12/2019   Lab Results  Component Value Date   TRIG 85 12/12/2019   Lab Results  Component Value Date   CHOLHDL 2.3 12/12/2019   Lab Results  Component Value Date   HGBA1C 5.7 (H) 12/12/2019       Assessment & Plan:   Problem List Items Addressed This Visit      Cardiovascular and Mediastinum   Essential hypertension - Primary   Relevant Orders   CBC with Differential/Platelet   CMP14+EGFR   Lipid Panel With LDL/HDL Ratio     Other   Hyperlipemia   Relevant Orders  Lipid Panel With LDL/HDL Ratio   Depression, major, single episode, severe ([place])    -She stopped sertraline and is doing better; had side effects, felt funny -doing well without meds and just therapy -has 2 more sessions left      Insomnia    -doing well with trazodone PRN      Palpitations    -occurs when she lays down -referral to cardiology      Relevant Orders   Ambulatory referral to Cardiology       No orders of the defined types were placed in this encounter.    Noreene Larsson, NP

## 2020-11-27 NOTE — Assessment & Plan Note (Signed)
-  occurs when she lays down -referral to cardiology

## 2020-11-27 NOTE — Assessment & Plan Note (Signed)
-  doing well with trazodone PRN

## 2020-11-27 NOTE — Patient Instructions (Signed)
Please have fasting labs drawn 2-3 days prior to your appointment so we can discuss the results during your office visit.  

## 2020-12-08 ENCOUNTER — Other Ambulatory Visit: Payer: Self-pay

## 2020-12-08 ENCOUNTER — Inpatient Hospital Stay (HOSPITAL_COMMUNITY): Payer: Medicare Other | Attending: Hematology

## 2020-12-08 DIAGNOSIS — Z800 Family history of malignant neoplasm of digestive organs: Secondary | ICD-10-CM | POA: Diagnosis not present

## 2020-12-08 DIAGNOSIS — Z803 Family history of malignant neoplasm of breast: Secondary | ICD-10-CM | POA: Insufficient documentation

## 2020-12-08 DIAGNOSIS — Z87891 Personal history of nicotine dependence: Secondary | ICD-10-CM | POA: Insufficient documentation

## 2020-12-08 DIAGNOSIS — Z823 Family history of stroke: Secondary | ICD-10-CM | POA: Insufficient documentation

## 2020-12-08 DIAGNOSIS — D509 Iron deficiency anemia, unspecified: Secondary | ICD-10-CM | POA: Insufficient documentation

## 2020-12-08 DIAGNOSIS — Z79899 Other long term (current) drug therapy: Secondary | ICD-10-CM | POA: Diagnosis not present

## 2020-12-08 DIAGNOSIS — Z8249 Family history of ischemic heart disease and other diseases of the circulatory system: Secondary | ICD-10-CM | POA: Insufficient documentation

## 2020-12-08 DIAGNOSIS — R200 Anesthesia of skin: Secondary | ICD-10-CM | POA: Diagnosis not present

## 2020-12-08 DIAGNOSIS — R5383 Other fatigue: Secondary | ICD-10-CM | POA: Diagnosis not present

## 2020-12-08 LAB — CBC WITH DIFFERENTIAL/PLATELET
Abs Immature Granulocytes: 0.01 10*3/uL (ref 0.00–0.07)
Basophils Absolute: 0 10*3/uL (ref 0.0–0.1)
Basophils Relative: 1 %
Eosinophils Absolute: 0.2 10*3/uL (ref 0.0–0.5)
Eosinophils Relative: 4 %
HCT: 46.1 % — ABNORMAL HIGH (ref 36.0–46.0)
Hemoglobin: 14.3 g/dL (ref 12.0–15.0)
Immature Granulocytes: 0 %
Lymphocytes Relative: 38 %
Lymphs Abs: 1.8 10*3/uL (ref 0.7–4.0)
MCH: 27 pg (ref 26.0–34.0)
MCHC: 31 g/dL (ref 30.0–36.0)
MCV: 87.1 fL (ref 80.0–100.0)
Monocytes Absolute: 0.5 10*3/uL (ref 0.1–1.0)
Monocytes Relative: 11 %
Neutro Abs: 2.2 10*3/uL (ref 1.7–7.7)
Neutrophils Relative %: 46 %
Platelets: 261 10*3/uL (ref 150–400)
RBC: 5.29 MIL/uL — ABNORMAL HIGH (ref 3.87–5.11)
RDW: 12.6 % (ref 11.5–15.5)
WBC: 4.7 10*3/uL (ref 4.0–10.5)
nRBC: 0 % (ref 0.0–0.2)

## 2020-12-08 LAB — IRON AND TIBC
Iron: 72 ug/dL (ref 28–170)
Saturation Ratios: 19 % (ref 10.4–31.8)
TIBC: 374 ug/dL (ref 250–450)
UIBC: 302 ug/dL

## 2020-12-15 ENCOUNTER — Ambulatory Visit
Admission: EM | Admit: 2020-12-15 | Discharge: 2020-12-15 | Disposition: A | Discharge status: Home / Self Care | Payer: Medicare Other | Attending: Family Medicine | Admitting: Family Medicine

## 2020-12-15 ENCOUNTER — Inpatient Hospital Stay (HOSPITAL_COMMUNITY): Payer: Medicare Other | Admitting: Hematology

## 2020-12-15 ENCOUNTER — Encounter: Payer: Self-pay | Admitting: Emergency Medicine

## 2020-12-15 ENCOUNTER — Other Ambulatory Visit: Payer: Self-pay

## 2020-12-15 ENCOUNTER — Ambulatory Visit (INDEPENDENT_AMBULATORY_CARE_PROVIDER_SITE_OTHER): Payer: Medicare Other

## 2020-12-15 DIAGNOSIS — Z7689 Persons encountering health services in other specified circumstances: Secondary | ICD-10-CM | POA: Diagnosis not present

## 2020-12-15 DIAGNOSIS — I10 Essential (primary) hypertension: Secondary | ICD-10-CM

## 2020-12-15 DIAGNOSIS — R059 Cough, unspecified: Secondary | ICD-10-CM

## 2020-12-15 DIAGNOSIS — R509 Fever, unspecified: Secondary | ICD-10-CM | POA: Diagnosis not present

## 2020-12-15 DIAGNOSIS — J189 Pneumonia, unspecified organism: Secondary | ICD-10-CM

## 2020-12-15 DIAGNOSIS — R52 Pain, unspecified: Secondary | ICD-10-CM

## 2020-12-15 MED ORDER — PROMETHAZINE-DM 6.25-15 MG/5ML PO SYRP: 5.0000 mL | ORAL_SOLUTION | Freq: Three times a day (TID) | ORAL | 0 refills | Status: DC | PRN

## 2020-12-15 MED ORDER — PREDNISONE 20 MG PO TABS: 20.0000 mg | ORAL_TABLET | Freq: Every day | ORAL | 0 refills | Status: AC

## 2020-12-15 MED ORDER — CEFDINIR 300 MG PO CAPS: 300.0000 mg | ORAL_CAPSULE | Freq: Two times a day (BID) | ORAL | 0 refills | Status: AC

## 2020-12-15 NOTE — Discharge Instructions (Addendum)
Follow-up with primary care provider in 2 weeks for repeat chest x-ray to ensure resolution of pneumonia  Take hydrochlorothiazide when you get home

## 2020-12-15 NOTE — ED Provider Notes (Signed)
[facility]    CSN: [id number] Arrival date & time: 12/15/20  5053      History   Chief Complaint No chief complaint on file.   HPI Nicole Horn is a 75 y.o. female.   HPI  Patient presents today with fever, cough (x 3 weeks), Body aches, fatigue, congestion gradually progressing over 3 weeks.  Fever is a new symptom patient was unaware that she was febrile.  Past medical history CAD, sleep apnea and type 2 diabetes.  Patient is also hypertensive on arrival today.  She reports taking 2 out of 3 of her blood pressure medicines has not taken her hydrochlorothiazide.  Denies any dizziness or active chest pain however has some chest tightness when she persistently coughing.  Cough has been occasionally productive.  Patient is unaware of any sick contacts.  She has been taken over-the-counter medication without relief of her symptoms. Patient is a former smoker.  Past Medical History:  Diagnosis Date  . 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 Chronic thumb pain, right   . Essential hypertension   . Heart attack [facility]) 2004   [place] - hospitalized with stress test by report and presumably medical therapy; subsequent cardiac catheterization (no PCI)  . History of COVID-19 08/2019  . History of irregular heartbeat   . History of syphilis   . Mixed hyperlipidemia   . PAD (peripheral artery disease) ([place]) 2019   PTCA/stent [place]  . Pneumonia due to COVID-19 virus 09/10/2019  . Sleep apnea    Pt supposed to wear CPAP, but doesn't.  . Type 2 diabetes mellitus [facility])     Patient Active Problem List   Diagnosis Date Noted  . Palpitations 11/27/2020  . Depression, major, single episode, severe ([place]) 10/22/2020  . Insomnia 10/22/2020  . Caregiver role strain 07/23/2020  . Primary osteoarthritis of first carpometacarpal joint of right hand 07/16/2020  . Pain of right thumb 07/16/2020  . Annual visit for general adult medical examination with abnormal  findings 04/16/2020  . Postmenopausal 04/16/2020  . Screening mammogram, encounter for 04/16/2020  . Atypical mole 04/16/2020  . Need for immunization against influenza 04/16/2020  . Constipation 03/17/2020  . Symptomatic anemia 03/14/2020  . PVD (peripheral vascular disease) ([place]) 03/14/2020  . CAD (coronary artery disease) 03/14/2020  . Iron deficiency anemia 03/14/2020  . DOE (dyspnea on exertion) 03/10/2020  . Rectal bleeding 01/09/2020  . Microcytic anemia 01/02/2020  . H/O adenomatous polyp of colon 01/02/2020  . FH: colon cancer 01/02/2020  . Heart disease 12/11/2019  . Upper airway cough syndrome 11/13/2019  . Overweight with body mass index (BMI) of 29 to 29.9 in adult 10/16/2019  . Essential hypertension 09/10/2019  . Hyperlipemia 09/10/2019    Past Surgical History:  Procedure Laterality Date  . ABDOMINAL HYSTERECTOMY    . BACK SURGERY    . BARTHOLIN GLAND CYST EXCISION Left 03/04/2015   Procedure: EXCISION OF LEFT BARTHOLINS TUMOR;  Surgeon: [name], MD;  Location: [facility];  Service: Gynecology;  Laterality: Left;  . CERVICAL SPINE SURGERY    . COLONOSCOPY N/A 02/01/2020   Procedure: COLONOSCOPY;  Surgeon: Daneil Dolin, MD; Scattered medium mouth diverticula in the sigmoid and descending colon, otherwise normal exam.  . CYST REMOVAL TRUNK    . ESOPHAGOGASTRODUODENOSCOPY (EGD) WITH PROPOFOL N/A 03/27/2020   Procedure: ESOPHAGOGASTRODUODENOSCOPY (EGD) WITH PROPOFOL;  Surgeon: Daneil Dolin, MD;  Location: [facility];  Service: Endoscopy;  Laterality: N/A;  1:00pm  .  GIVENS CAPSULE STUDY N/A 03/27/2020   Procedure: GIVENS CAPSULE STUDY;  Surgeon: Daneil Dolin, MD;  Location: [facility];  Service: Endoscopy;  Laterality: N/A;  . Multiple cyst removal surgeries    . Stent of lower extremities      OB History    Gravida  1   Para  1   Term      Preterm  1   AB      Living  1     SAB      IAB      Ectopic      Multiple      Live Births   1            Home Medications    Prior to Admission medications   Medication Sig Start Date End Date Taking? Authorizing Provider  cefdinir (OMNICEF) 300 MG capsule Take 1 capsule (300 mg total) by mouth 2 (two) times daily for 10 days. 12/15/20 12/25/20 Yes Scot Jun, FNP  predniSONE (DELTASONE) 20 MG tablet Take 1 tablet (20 mg total) by mouth daily with breakfast for 5 days. 12/15/20 12/20/20 Yes Scot Jun, FNP  promethazine-dextromethorphan (PROMETHAZINE-DM) 6.25-15 MG/5ML syrup Take 5 mLs by mouth 3 (three) times daily as needed for cough. 12/15/20  Yes Scot Jun, FNP  albuterol (VENTOLIN HFA) 108 (90 Base) MCG/ACT inhaler Inhale 2 puffs into the lungs every 4 (four) hours as needed for wheezing or shortness of breath.    [provider]  amLODipine (NORVASC) 10 MG tablet TAKE 1 TABLET BY MOUTH EVERY DAY 11/26/20   Fayrene Helper, MD  ASPIRIN 81 PO Take 81 mg by mouth daily.    [provider]  Calcium Carb-Cholecalciferol (CALTRATE 600+D3) 600-800 MG-UNIT TABS Take 2 tablets by mouth 3 (three) times daily after meals.    [provider]  clopidogrel (PLAVIX) 75 MG tablet TAKE 1 TABLET BY MOUTH  DAILY 11/12/20   Noreene Larsson, NP  hydrochlorothiazide (HYDRODIURIL) 25 MG tablet TAKE 1 TABLET BY MOUTH  DAILY 11/12/20   Noreene Larsson, NP  Multiple Vitamin (MULTIVITAMIN WITH MINERALS) TABS tablet Take 1 tablet by mouth daily.    [provider]  olmesartan-hydrochlorothiazide (BENICAR HCT) 20-12.5 MG tablet Take 1 tablet by mouth daily. 08/20/20   [name], NP  rosuvastatin (CRESTOR) 40 MG tablet TAKE 1 TABLET BY MOUTH EVERY DAY 10/20/20   Noreene Larsson, NP  sertraline (ZOLOFT) 50 MG tablet Take 1 tablet (50 mg total) by mouth daily. Patient not taking: Reported on 11/27/2020 10/22/20   Noreene Larsson, NP  traZODone (DESYREL) 50 MG tablet Take 0.5-1 tablets (25-50 mg total) by mouth at bedtime as needed for sleep. 10/22/20    Noreene Larsson, NP    Family History Family History  Problem Relation Age of Onset  . Colon cancer Mother        In her 85s  . Breast cancer Sister   . Stomach cancer Maternal Aunt   . Healthy Brother   . Stroke Paternal Grandmother   . Heart attack Paternal Grandmother     Social History Social History   Tobacco Use  . Smoking status: Former Smoker    Packs/day: 2.00    Years: 25.00    Pack years: 50.00    Types: Cigarettes    Quit date: 03/10/1985    Years since quitting: 35.7  . Smokeless tobacco: Never Used  Vaping Use  . Vaping Use:  Never used  Substance Use Topics  . Alcohol use: Yes    Alcohol/week: 0.0 standard drinks    Comment: socially  . Drug use: No     Allergies   Patient has no known allergies.   Review of Systems Review of Systems Pertinent negatives listed in HPI   Physical Exam Triage Vital Signs ED Triage Vitals  Enc Vitals Group     BP 12/15/20 1002 (!) 200/97     Pulse Rate 12/15/20 1002 85     Resp 12/15/20 1002 16     Temp 12/15/20 1002 100.1 F (37.8 C)     Temp Source 12/15/20 1002 Oral     SpO2 12/15/20 1002 93 %     Weight --      Height --      Head Circumference --      Peak Flow --      Pain Score 12/15/20 1004 0     Pain Loc --      Pain Edu? --      Excl. in [place]? --    No data found.  Updated Vital Signs BP (!) 200/97 (BP Location: Right Arm)   Pulse 85   Temp 100.1 F (37.8 C) (Oral)   Resp 16   SpO2 93%   Visual Acuity Right Eye Distance:   Left Eye Distance:   Bilateral Distance:    Right Eye Near:   Left Eye Near:    Bilateral Near:     Physical Exam General appearance: alert, Ill-appearing, no distress Head: Normocephalic, without obvious abnormality, atraumatic ENT: Ears normal,nares mucosal edema, congestion, oropharynx w/o exudate Respiratory: Respirations even , unlabored, diminished air movement, rales mid R/L lobes  Heart: Rate and rhythm normal. No gallop or murmurs noted on exam   Abdomen: BS +, no distention, no rebound tenderness, or no mass Extremities: No gross deformities Skin: Skin color, texture, turgor normal. No rashes seen  Psych: Appropriate mood and affect. Neurologic: GCS 15, normal coordination, normal gait    UC Treatments / Results  Labs (all labs ordered are listed, but only abnormal results are displayed) Labs Reviewed  COVID-19, FLU A+B NAA    EKG   Radiology DG Chest 2 View  Result Date: 12/15/2020 CLINICAL DATA:  Fever/cough EXAM: CHEST - 2 VIEW COMPARISON:  March 10, 2020. FINDINGS: The heart size and mediastinal contours are within normal limits. Calcific atherosclerosis of the aorta. No substantial change in linear/streaky opacities in the left greater than right lung bases, most likely representing scarring given stability over multiple priors. No new consolidation. No visible pleural effusions or pneumothorax. The visualized skeletal structures are unremarkable. Partially imaged cervical fusion hardware. IMPRESSION: No substantial change in linear/streaky opacities in the left greater than right lung bases, most likely scarring given stability over multiple priors. No new consolidation. Electronically Signed   By: Margaretha Sheffield MD   On: 12/15/2020 10:28    Procedures Procedures (including critical care time)  Medications Ordered in UC Medications - No data to display  Initial Impression / Assessment and Plan / UC Course  I have reviewed the triage vital signs and the nursing notes.  Pertinent labs & imaging results that were available during my care of the patient were reviewed by me and considered in my medical decision making (see chart for details).    Chest x-ray concerning for possible left pneumonia we will treat empirically with Omnicef 300 mg twice daily.  Given patient is having some mild shortness  of breath and decreased air movement will also cover with a short course of prednisone 20 mg once daily for 5 days.   Encourage patient to also use her albuterol inhaler for acute shortness of breath.  Patient will also need to follow-up with primary care provider in 2 weeks for repeat chest x-ray to ensure resolution of current symptoms.  Promethazine DM prescribed for cough as needed.  If symptoms worsen go immediately to ER. Patient's blood pressure today is accelerated however she is asymptomatic and has not taken all of her blood pressure medicine.  Advised her upon her return at home to go ahead and take hydrochlorothiazide and monitor pressures at home.  If any of her symptoms worsen go immediately to the emergency department.  COVID flu test pending given fever. Final Clinical Impressions(s) / UC Diagnoses   Final diagnoses:  Pneumonia of left lower lobe due to infectious organism  Fever, unspecified  Body aches  Accelerated hypertension     Discharge Instructions     Follow-up with primary care provider in 2 weeks for repeat chest x-ray to ensure resolution of pneumonia  Take hydrochlorothiazide when you get home    ED Prescriptions    Medication Sig Dispense Auth. Provider   cefdinir (OMNICEF) 300 MG capsule Take 1 capsule (300 mg total) by mouth 2 (two) times daily for 10 days. 20 capsule Scot Jun, FNP   promethazine-dextromethorphan (PROMETHAZINE-DM) 6.25-15 MG/5ML syrup Take 5 mLs by mouth 3 (three) times daily as needed for cough. 118 mL Scot Jun, FNP   predniSONE (DELTASONE) 20 MG tablet Take 1 tablet (20 mg total) by mouth daily with breakfast for 5 days. 5 tablet Scot Jun, FNP     PDMP not reviewed this encounter.   Scot Jun, FNP 12/15/20 1119

## 2020-12-15 NOTE — ED Triage Notes (Signed)
Body aches, fatigue and sinus congestion since Sunday.

## 2020-12-16 LAB — COVID-19, FLU A+B NAA
Influenza A, NAA: NOT DETECTED
Influenza B, NAA: NOT DETECTED
SARS-CoV-2, NAA: DETECTED — AB

## 2021-01-01 ENCOUNTER — Ambulatory Visit: Payer: Medicare Other | Admitting: Nurse Practitioner

## 2021-01-02 ENCOUNTER — Other Ambulatory Visit: Payer: Self-pay

## 2021-01-02 ENCOUNTER — Encounter: Payer: Self-pay | Admitting: Nurse Practitioner

## 2021-01-02 ENCOUNTER — Ambulatory Visit (INDEPENDENT_AMBULATORY_CARE_PROVIDER_SITE_OTHER): Payer: Medicare Other | Admitting: Nurse Practitioner

## 2021-01-02 DIAGNOSIS — R002 Palpitations: Secondary | ICD-10-CM

## 2021-01-02 DIAGNOSIS — B009 Herpesviral infection, unspecified: Secondary | ICD-10-CM | POA: Insufficient documentation

## 2021-01-02 DIAGNOSIS — I10 Essential (primary) hypertension: Secondary | ICD-10-CM | POA: Diagnosis not present

## 2021-01-02 MED ORDER — OLMESARTAN MEDOXOMIL-HCTZ 40-25 MG PO TABS: 1.0000 | ORAL_TABLET | Freq: Every day | ORAL | 1 refills | Status: DC

## 2021-01-02 NOTE — Assessment & Plan Note (Signed)
-  was prescribed valacyclovir by Dr. Elonda Horn; appears to be for outbreaks, but she has been getting refills every 10 days and taking it BID (not BID PRN for outbreak), and she asked for a larger quantity to be dispensed with each refill -she will contact Dr. Elonda Horn to make sure she is taking the medicine as intended

## 2021-01-02 NOTE — Progress Notes (Signed)
Acute Office Visit  Subjective:    Patient ID: Nicole Horn, female    DOB: [birth year], 75 y.o.   MRN: [id number]  Chief Complaint  Patient presents with  . Hypertension    HPI Patient is in today for HTN follow-up.   She is currently taking amlodipine and olmesartan/HCTZ.   At her last OV on 11/27/20, she reported palpitations, and she has upcoming appointment with Dr. Domenic Polite in about 2 weeks.   She did not have fasting labs drawn prior to her visit today. She has had some bloating and left flank pain.   Past Medical History:  Diagnosis Date  . 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 Chronic thumb pain, right   . Essential hypertension   . Heart attack [facility]) 2004   [place] - hospitalized with stress test by report and presumably medical therapy; subsequent cardiac catheterization (no PCI)  . History of COVID-19 08/2019  . History of irregular heartbeat   . History of syphilis   . Mixed hyperlipidemia   . PAD (peripheral artery disease) ([place]) 2019   PTCA/stent [place]  . Pneumonia due to COVID-19 virus 09/10/2019  . Sleep apnea    Pt supposed to wear CPAP, but doesn't.  . Type 2 diabetes mellitus ([place])     Past Surgical History:  Procedure Laterality Date  . ABDOMINAL HYSTERECTOMY    . BACK SURGERY    . BARTHOLIN GLAND CYST EXCISION Left 03/04/2015   Procedure: EXCISION OF LEFT BARTHOLINS TUMOR;  Surgeon: [name], MD;  Location: [facility] OR[facility];  Service: Gynecology;  Laterality: Left;  . CERVICAL SPINE SURGERY    . COLONOSCOPY N/A 02/01/2020   Procedure: COLONOSCOPY;  Surgeon: Daneil Dolin, MD; Scattered medium mouth diverticula in the sigmoid and descending colon, otherwise normal exam.  . CYST REMOVAL TRUNK    . ESOPHAGOGASTRODUODENOSCOPY (EGD) WITH PROPOFOL N/A 03/27/2020   Procedure: ESOPHAGOGASTRODUODENOSCOPY (EGD) WITH PROPOFOL;  Surgeon: Daneil Dolin, MD;  Location: [facility] END[facility];  Service: Endoscopy;  Laterality: N/A;  1:00pm  . GIVENS  CAPSULE STUDY N/A 03/27/2020   Procedure: GIVENS CAPSULE STUDY;  Surgeon: Daneil Dolin, MD;  Location: [facility] END[facility];  Service: Endoscopy;  Laterality: N/A;  . Multiple cyst removal surgeries    . Stent of lower extremities      Family History  Problem Relation Age of Onset  . Colon cancer Mother        In her 37s  . Breast cancer Sister   . Stomach cancer Maternal Aunt   . Healthy Brother   . Stroke Paternal Grandmother   . Heart attack Paternal Grandmother     Social History   Socioeconomic History  . Marital status: Widowed    Spouse name: Not on file  . Number of children: 1  . Years of education: Not on file  . Highest education level: Some college, no degree  Occupational History  . Not on file  Tobacco Use  . Smoking status: Former Smoker    Packs/day: 2.00    Years: 25.00    Pack years: 50.00    Types: Cigarettes    Quit date: 03/10/1985    Years since quitting: 35.8  . Smokeless tobacco: Never Used  Vaping Use  . Vaping Use: Never used  Substance and Sexual Activity  . Alcohol use: Yes    Alcohol/week: 0.0 standard drinks    Comment: socially  . Drug use: No  . Sexual activity: Yes  Birth control/protection: Surgical  Other Topics Concern  . Not on file  Social History Narrative   Lives in [place] and [place] 6 months here and there owns an apt in [place] alone, but helps to care for mother who is 39 years (goes between the kids)   Sister is in [place] is in [place]      Son who is 62 years old lives in [place], [place]    Has 6 grand kids and 7 great grands      Enjoys exercise (harder since COVID)-dancing      Diet: eats all foods-enjoys smoothies, avoids fried foods, limited red meat if any   Caffeine: coffee 1 cup in the morning-sometimes will have 1 in evening    Water: 3-4 bottles daily       Wears seat belt   Smoke detectors in home   Does not use phone while driving            Social Determinants of Health    Financial Resource Strain: Medium Risk  . Difficulty of Paying Living Expenses: Somewhat hard  Food Insecurity: Food Insecurity Present  . Worried About Charity fundraiser in the Last Year: Never true  . Ran Out of Food in the Last Year: Sometimes true  Transportation Needs: No Transportation Needs  . Lack of Transportation (Medical): No  . Lack of Transportation (Non-Medical): No  Physical Activity: Inactive  . Days of Exercise per Week: 0 days  . Minutes of Exercise per Session: 0 min  Stress: Stress Concern Present  . Feeling of Stress : To some extent  Social Connections: Moderately Integrated  . Frequency of Communication with Friends and Family: More than three times a week  . Frequency of Social Gatherings with Friends and Family: Three times a week  . Attends Religious Services: More than 4 times per year  . Active Member of Clubs or Organizations: Yes  . Attends Archivist Meetings: 1 to 4 times per year  . Marital Status: Widowed  Intimate Partner Violence: Not At Risk  . Fear of Current or Ex-Partner: No  . Emotionally Abused: No  . Physically Abused: No  . Sexually Abused: No    Outpatient Medications Prior to Visit  Medication Sig Dispense Refill  . amLODipine (NORVASC) 10 MG tablet TAKE 1 TABLET BY MOUTH EVERY DAY 90 tablet 1  . ASPIRIN 81 PO Take 81 mg by mouth daily.    . Calcium Carb-Cholecalciferol (CALTRATE 600+D3) 600-800 MG-UNIT TABS Take 2 tablets by mouth 3 (three) times daily after meals.    . clopidogrel (PLAVIX) 75 MG tablet TAKE 1 TABLET BY MOUTH  DAILY 90 tablet 3  . meloxicam (MOBIC) 7.5 MG tablet Take 7.5 mg by mouth daily.    . Multiple Vitamin (MULTIVITAMIN WITH MINERALS) TABS tablet Take 1 tablet by mouth daily.    . rosuvastatin (CRESTOR) 40 MG tablet TAKE 1 TABLET BY MOUTH EVERY DAY 90 tablet 1  . traZODone (DESYREL) 50 MG tablet Take 0.5-1 tablets (25-50 mg total) by mouth at bedtime as needed for sleep. 30 tablet 3  .  valACYclovir (VALTREX) 500 MG tablet Take 500 mg by mouth 2 (two) times daily.    Marland Kitchen albuterol (VENTOLIN HFA) 108 (90 Base) MCG/ACT inhaler Inhale 2 puffs into the lungs every 4 (four) hours as needed for wheezing or shortness of breath.    . hydrochlorothiazide (HYDRODIURIL) 25 MG tablet TAKE 1 TABLET BY  MOUTH  DAILY 90 tablet 3  . olmesartan-hydrochlorothiazide (BENICAR HCT) 20-12.5 MG tablet Take 1 tablet by mouth daily. 90 tablet 1  . promethazine-dextromethorphan (PROMETHAZINE-DM) 6.25-15 MG/5ML syrup Take 5 mLs by mouth 3 (three) times daily as needed for cough. 118 mL 0  . sertraline (ZOLOFT) 50 MG tablet Take 1 tablet (50 mg total) by mouth daily. (Patient not taking: Reported on 11/27/2020) 30 tablet 3   No facility-administered medications prior to visit.    No Known Allergies  Review of Systems  Constitutional: Negative.   Respiratory: Negative.   Cardiovascular: Positive for palpitations.  Gastrointestinal: Positive for abdominal distention and abdominal pain.  Psychiatric/Behavioral: Negative.        Objective:    Physical Exam Constitutional:      Appearance: Normal appearance.  Cardiovascular:     Rate and Rhythm: Normal rate. Rhythm irregular.     Pulses: Normal pulses.     Heart sounds: Normal heart sounds.  Pulmonary:     Effort: Pulmonary effort is normal.     Breath sounds: Normal breath sounds.  Neurological:     Mental Status: She is alert.  Psychiatric:        Behavior: Behavior normal.        Thought Content: Thought content normal.        Judgment: Judgment normal.     Comments: Anxious affect     BP (!) 158/72   Pulse 76   Temp 98.6 F (37 C)   Resp (!) 76   Ht 5' (1.524 m)   Wt 168 lb (76.2 kg)   SpO2 94%   BMI 32.81 kg/m  Wt Readings from Last 3 Encounters:  01/02/21 168 lb (76.2 kg)  11/27/20 166 lb (75.3 kg)  11/20/20 170 lb (77.1 kg)    Health Maintenance Due  Topic Date Due  . PNA vac Low Risk Adult (1 of 2 - PCV13) Never done   . COVID-19 Vaccine (3 - Booster for Moderna series) 05/06/2020    There are no preventive care reminders to display for this patient.   No results found for: TSH Lab Results  Component Value Date   WBC 4.7 12/08/2020   HGB 14.3 12/08/2020   HCT 46.1 (H) 12/08/2020   MCV 87.1 12/08/2020   PLT 261 12/08/2020   Lab Results  Component Value Date   NA 142 04/24/2020   K 3.6 04/24/2020   CO2 26 04/24/2020   GLUCOSE 81 04/24/2020   BUN 15 04/24/2020   CREATININE 1.05 (H) 04/24/2020   BILITOT 0.4 04/24/2020   ALKPHOS 70 04/24/2020   AST 28 04/24/2020   ALT 26 04/24/2020   PROT 7.9 04/24/2020   ALBUMIN 4.1 04/24/2020   CALCIUM 9.8 04/24/2020   ANIONGAP 9 04/24/2020   Lab Results  Component Value Date   CHOL 106 12/12/2019   Lab Results  Component Value Date   HDL 46 (L) 12/12/2019   Lab Results  Component Value Date   LDLCALC 43 12/12/2019   Lab Results  Component Value Date   TRIG 85 12/12/2019   Lab Results  Component Value Date   CHOLHDL 2.3 12/12/2019   Lab Results  Component Value Date   HGBA1C 5.7 (H) 12/12/2019       Assessment & Plan:   Problem List Items Addressed This Visit      Cardiovascular and Mediastinum   Essential hypertension    BP Readings from Last 3 Encounters:  01/02/21 (!) 158/72  12/15/20 Marland Kitchen)  200/97  11/27/20 (!) 143/72  -continue amlodipine -INCREASE olmesartan-HCTZ to 40/25mg       Relevant Medications   olmesartan-hydrochlorothiazide (BENICAR HCT) 40-25 MG tablet     Other   Palpitations    -irregular rhythm heard on exam -EKG performed and it showed NSR with rate 63 -she will see Dr. Domenic Polite in about 2 weeks      Herpes    -was prescribed valacyclovir by Dr. Elonda Husky; appears to be for outbreaks, but she has been getting refills every 10 days and taking it BID (not BID PRN for outbreak), and she asked for a larger quantity to be dispensed with each refill -she will contact Dr. Elonda Husky to make sure she is taking the  medicine as intended      Relevant Medications   valACYclovir (VALTREX) 500 MG tablet       Meds ordered this encounter  Medications  . olmesartan-hydrochlorothiazide (BENICAR HCT) 40-25 MG tablet    Sig: Take 1 tablet by mouth daily.    Dispense:  90 tablet    Refill:  [place], NP

## 2021-01-02 NOTE — Assessment & Plan Note (Signed)
BP Readings from Last 3 Encounters:  01/02/21 (!) 158/72  12/15/20 (!) 200/97  11/27/20 (!) 143/72  -continue amlodipine -INCREASE olmesartan-HCTZ to 40/25mg 

## 2021-01-02 NOTE — Assessment & Plan Note (Signed)
-  irregular rhythm heard on exam -EKG performed and it showed NSR with rate 63 -she will see Dr. Domenic Polite in about 2 weeks

## 2021-01-02 NOTE — Patient Instructions (Signed)
Please have fasting labs drawn 2-3 days prior to your appointment so we can discuss the results during your office visit.  

## 2021-01-14 ENCOUNTER — Other Ambulatory Visit: Payer: Self-pay

## 2021-01-14 ENCOUNTER — Other Ambulatory Visit: Payer: Self-pay | Admitting: Cardiology

## 2021-01-14 ENCOUNTER — Ambulatory Visit: Payer: Medicare Other | Admitting: Cardiology

## 2021-01-14 ENCOUNTER — Ambulatory Visit (INDEPENDENT_AMBULATORY_CARE_PROVIDER_SITE_OTHER): Payer: Medicare Other

## 2021-01-14 ENCOUNTER — Encounter: Payer: Self-pay | Admitting: Cardiology

## 2021-01-14 VITALS — BP 158/76 | HR 64 | Ht 60.0 in | Wt 169.0 lb

## 2021-01-14 DIAGNOSIS — R002 Palpitations: Secondary | ICD-10-CM

## 2021-01-14 DIAGNOSIS — I739 Peripheral vascular disease, unspecified: Secondary | ICD-10-CM

## 2021-01-14 DIAGNOSIS — I25119 Atherosclerotic heart disease of native coronary artery with unspecified angina pectoris: Secondary | ICD-10-CM | POA: Diagnosis not present

## 2021-01-14 NOTE — Progress Notes (Signed)
Cardiology Office Note  Date: 01/14/2021   ID: Nicole Horn, DOB February 12, 1946, MRN [id number]  PCP:  [name], NP  Cardiologist:  Rozann Lesches, MD Electrophysiologist:  None   Chief Complaint  Patient presents with  . Cardiac follow-up    History of Present Illness: Nicole Horn is a 75 y.o. female last assessed via telehealth encounter in December 2021.  She presents for a follow-up visit, recently seen by her PCP.  From a cardiac perspective she describes intermittent sense of palpitations, feels like it may be related to stress.  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  She does not report any definite angina symptoms.  I reviewed her recent ECG which is outlined below.  She has no known history of cardiac arrhythmia based on available information.  We discussed obtaining a cardiac monitor for further investigation.  She reports symptoms at least a few times a week.  She plans to have dental work done after going back to [place] in early June.  She still has an apartment there.  Her medications which are outlined below.  Past Medical History:  Diagnosis Date  . 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 Chronic thumb pain, right   . Essential hypertension   . Heart attack [facility]) 2004   [place] - hospitalized with stress test by report and presumably medical therapy; subsequent cardiac catheterization (no PCI)  . History of COVID-19 08/2019  . History of irregular heartbeat   . History of syphilis   . Mixed hyperlipidemia   . PAD (peripheral artery disease) ([place]) 2019   PTCA/stent [place]  . Pneumonia due to COVID-19 virus 09/10/2019  . Sleep apnea    Pt supposed to wear CPAP, but doesn't.  . Type 2 diabetes mellitus ([place])     Past Surgical History:  Procedure Laterality Date  . ABDOMINAL HYSTERECTOMY    . BACK SURGERY    . BARTHOLIN GLAND CYST EXCISION Left 03/04/2015    Procedure: EXCISION OF LEFT BARTHOLINS TUMOR;  Surgeon: [name], MD;  Location: [facility];  Service: Gynecology;  Laterality: Left;  . CERVICAL SPINE SURGERY    . COLONOSCOPY N/A 02/01/2020   Procedure: COLONOSCOPY;  Surgeon: Daneil Dolin, MD; Scattered medium mouth diverticula in the sigmoid and descending colon, otherwise normal exam.  . CYST REMOVAL TRUNK    . ESOPHAGOGASTRODUODENOSCOPY (EGD) WITH PROPOFOL N/A 03/27/2020   Procedure: ESOPHAGOGASTRODUODENOSCOPY (EGD) WITH PROPOFOL;  Surgeon: Daneil Dolin, MD;  Location: [facility];  Service: Endoscopy;  Laterality: N/A;  1:00pm  . GIVENS CAPSULE STUDY N/A 03/27/2020   Procedure: GIVENS CAPSULE STUDY;  Surgeon: Daneil Dolin, MD;  Location: [facility];  Service: Endoscopy;  Laterality: N/A;  . Multiple cyst removal surgeries    . Stent of lower extremities      Current Outpatient Medications  Medication Sig Dispense Refill  . amLODipine (NORVASC) 10 MG tablet TAKE 1 TABLET BY MOUTH EVERY DAY 90 tablet 1  . ASPIRIN 81 PO Take 81 mg by mouth daily.    . Calcium Carb-Cholecalciferol (CALTRATE 600+D3) 600-800 MG-UNIT TABS Take 2 tablets by mouth 3 (three) times daily after meals.    . clopidogrel (PLAVIX) 75 MG tablet TAKE 1 TABLET BY MOUTH  DAILY 90 tablet 3  . meloxicam (MOBIC) 7.5 MG tablet Take 7.5 mg by mouth daily.    . Multiple Vitamin (MULTIVITAMIN WITH MINERALS) TABS tablet  Take 1 tablet by mouth daily.    Marland Kitchen olmesartan-hydrochlorothiazide (BENICAR HCT) 40-25 MG tablet Take 1 tablet by mouth daily. 90 tablet 1  . rosuvastatin (CRESTOR) 40 MG tablet TAKE 1 TABLET BY MOUTH EVERY DAY 90 tablet 1  . traZODone (DESYREL) 50 MG tablet Take 0.5-1 tablets (25-50 mg total) by mouth at bedtime as needed for sleep. 30 tablet 3  . valACYclovir (VALTREX) 500 MG tablet Take 500 mg by mouth 2 (two) times daily.     No current facility-administered medications for this visit.   Allergies:  Patient has no known allergies.   ROS: No  syncope.  Physical Exam: VS:  BP (!) 158/76   Pulse 64   Ht 5' (1.524 m)   Wt 169 lb (76.7 kg)   SpO2 98%   BMI 33.01 kg/m , BMI Body mass index is 33.01 kg/m.  Wt Readings from Last 3 Encounters:  01/14/21 169 lb (76.7 kg)  01/02/21 168 lb (76.2 kg)  11/27/20 166 lb (75.3 kg)    General: Patient appears comfortable at rest. HEENT: Conjunctiva and lids normal, wearing a mask. Neck: Supple, no elevated JVP or carotid bruits, no thyromegaly. Lungs: Clear to auscultation, nonlabored breathing at rest. Cardiac: Regular rate and rhythm, no S3, 1/6 systolic murmur. Extremities: No pitting edema.  ECG:  An ECG dated 01/02/2021 was personally reviewed today and demonstrated:  Normal sinus rhythm with nonspecific T wave changes.  Recent Labwork: 04/24/2020: ALT 26; AST 28; BUN 15; Creatinine, Ser 1.05; Potassium 3.6; Sodium 142 12/08/2020: Hemoglobin 14.3; Platelets 261     Component Value Date/Time   CHOL 106 12/12/2019 1021   TRIG 85 12/12/2019 1021   HDL 46 (L) 12/12/2019 1021   CHOLHDL 2.3 12/12/2019 1021   LDLCALC 43 12/12/2019 1021    Other Studies Reviewed Today:  Echocardiogram 03/04/2020: 1. Left ventricular ejection fraction, by estimation, is 60 to 65%. The  left ventricle has normal function. The left ventricle has no regional  wall motion abnormalities. There is mild left ventricular hypertrophy.  Left ventricular diastolic parameters  are consistent with Grade I diastolic dysfunction (impaired relaxation).  2. Right ventricular systolic function is normal. The right ventricular  size is normal. There is normal pulmonary artery systolic pressure.  3. Right atrial size was mildly dilated.  4. The mitral valve is normal in structure. No evidence of mitral valve  regurgitation. No evidence of mitral stenosis.  5. The aortic valve is tricuspid. Aortic valve regurgitation is not  visualized. No aortic stenosis is present.  6. The inferior vena cava is normal in  size with greater than 50%  respiratory variability, suggesting right atrial pressure of 3 mmHg.   Assessment and Plan:  1.  Intermittent sense of palpitations as discussed above.  Recent ECG showed sinus rhythm with nonspecific T wave changes.  She has no prior documented history of arrhythmia based on limited information.  We will obtain a 7-day Zio patch for further investigation.  Symptoms are occurring a few times a week.  2.  Reported history of heart attack in 2004 and presumably ischemic heart disease based on evaluation in [place].  I do not have complete records but it does not sound like she required PCI or other forms of revascularization.  No definite angina at this point.  Continue aspirin, Plavix, Norvasc, Benicar HCT, and Crestor.  3.  PAD status post previous stent intervention to both legs, again in [place] with no details at this time.  Platelet regimen with statin.  ABIs were within normal range in July 2021.  She has evidence of mild to moderate bilateral SFA stenosis.  Medication Adjustments/Labs and Tests Ordered: Current medicines are reviewed at length with the patient today.  Concerns regarding medicines are outlined above.   Tests Ordered: No orders of the defined types were placed in this encounter.   Medication Changes: No orders of the defined types were placed in this encounter.   Disposition:  Follow up 6 months.  Signed, Satira Sark, MD, [facility] 01/14/2021 10:59 AM    [facility]. [address], [place], [place] [address] Phone: [phone]; Fax: [phone]

## 2021-01-14 NOTE — Patient Instructions (Addendum)
Medication Instructions:  Your physician recommends that you continue on your current medications as directed. Please refer to the Current Medication list given to you today.  *If you need a refill on your cardiac medications before your next appointment, please call your pharmacy*   Lab Work: None If you have labs (blood work) drawn today and your tests are completely normal, you will receive your results only by: Marland Kitchen MyChart Message (if you have MyChart) OR . A paper copy in the mail If you have any lab test that is abnormal or we need to change your treatment, we will call you to review the results.   Testing/Procedures: None   Follow-Up: At [facility], you and your health needs are our priority.  As part of our continuing mission to provide you with exceptional heart care, we have created designated Provider Care Teams.  These Care Teams include your primary Cardiologist (physician) and Advanced Practice Providers (APPs -  Physician Assistants and Nurse Practitioners) who all work together to provide you with the care you need, when you need it.  We recommend signing up for the patient portal called "MyChart".  Sign up information is provided on this After Visit Summary.  MyChart is used to connect with patients for Virtual Visits (Telemedicine).  Patients are able to view lab/test results, encounter notes, upcoming appointments, etc.  Non-urgent messages can be sent to your provider as well.   To learn more about what you can do with MyChart, go to [email or web address].    Your next appointment:   6 month with Myles Gip, MD.   Other Instructions  Your provider had requested that you wear a 7 Day Zio Patch for Palpitations.   ZIO AT  . Call [place] with any questions during wear 24/7: [phone] . Zio patch should be worn for 14 days unless otherwise instructed . The [facility]) will call you 24-48 hrs. after Elwyn Reach is applied, be sure to answer the phone  from a 224 area code.  They may call during wear with important information regarding your heart, so answer any calls from [employer] . Do not shower for 24 hours after Zio is applied (sponge bath is fine, just keep the patch dry) . After that, when showering avoid direct shower stream of water onto Zio patch, pat dry around the patch . Avoid excessive sweating . Push the button when you are feeling any cardiac symptoms and record them in the logbook or on the Zio app . Refer to the removal date listed on the front of the symptom logbook and remove Zio patch according to the instructions in the back of the logbook . Place Zio patch inside transmitter (sticky side facing up, button facing down) and put both the gateway and symptom logbook in the prepaid mailing envelope included in the back of the transmitter.  Place inside mailbox or any USPS mailbox

## 2021-01-17 NOTE — Progress Notes (Signed)
[place], [place] [address]   CLINIC:  Medical Oncology/Hematology  PCP:  [name], NP [place] / [place] [address]  [phone]  REASON FOR VISIT:  Follow-up for microcytic anemia  PRIOR THERAPY: Intermittent Feraheme last on 05/09/2020  CURRENT THERAPY: Iron tablet daily  INTERVAL HISTORY:  Ms. Nicole Horn, a 75 y.o. female, returns for routine follow-up for her microcytic anemia. Nicole Horn was last seen on 09/15/2020.  Today she reports feeling well. She reports that her fatigue levels have increased but they are tolerable. She denies bloody stools, and is taking iron tablets BID. She denies constipation, diarrhea, or stomach pain; her stools are dark but not black. She reports a mild craving for ice beginning 1 week ago.   REVIEW OF SYSTEMS:  Review of Systems  Constitutional: Positive for fatigue (50%). Negative for appetite change.  Respiratory: Positive for shortness of breath (w/ exertion).   Cardiovascular: Positive for leg swelling (ankle and feet) and palpitations (at night).  Gastrointestinal: Negative for abdominal pain, constipation and diarrhea.  Psychiatric/Behavioral: Positive for sleep disturbance (intermittent).  All other systems reviewed and are negative.   PAST MEDICAL/SURGICAL HISTORY:  Past Medical History:  Diagnosis Date  . 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 Chronic thumb pain, right   . Essential hypertension   . Heart attack [facility]) 2004   [place] - hospitalized with stress test by report and presumably medical therapy; subsequent cardiac catheterization (no PCI)  . History of COVID-19 08/2019  . History of irregular heartbeat   . History of syphilis   . Mixed hyperlipidemia   . PAD (peripheral artery disease) ([place]) 2019   PTCA/stent [place]  . Pneumonia due to COVID-19 virus 09/10/2019  . Sleep apnea    Pt supposed to wear CPAP, but doesn't.  . Type 2 diabetes  mellitus ([place])    Past Surgical History:  Procedure Laterality Date  . ABDOMINAL HYSTERECTOMY    . BACK SURGERY    . BARTHOLIN GLAND CYST EXCISION Left 03/04/2015   Procedure: EXCISION OF LEFT BARTHOLINS TUMOR;  Surgeon: [name], MD;  Location: [facility] ORS;  Service: Gynecology;  Laterality: Left;  . CERVICAL SPINE SURGERY    . COLONOSCOPY N/A 02/01/2020   Procedure: COLONOSCOPY;  Surgeon: Daneil Dolin, MD; Scattered medium mouth diverticula in the sigmoid and descending colon, otherwise normal exam.  . CYST REMOVAL TRUNK    . ESOPHAGOGASTRODUODENOSCOPY (EGD) WITH PROPOFOL N/A 03/27/2020   Procedure: ESOPHAGOGASTRODUODENOSCOPY (EGD) WITH PROPOFOL;  Surgeon: Daneil Dolin, MD;  Location: [facility] ENDO SUITE;  Service: Endoscopy;  Laterality: N/A;  1:00pm  . GIVENS CAPSULE STUDY N/A 03/27/2020   Procedure: GIVENS CAPSULE STUDY;  Surgeon: Daneil Dolin, MD;  Location: [facility] ENDO SUITE;  Service: Endoscopy;  Laterality: N/A;  . Multiple cyst removal surgeries    . Stent of lower extremities      SOCIAL HISTORY:  Social History   Socioeconomic History  . Marital status: Widowed    Spouse name: Not on file  . Number of children: 1  . Years of education: Not on file  . Highest education level: Some college, no degree  Occupational History  . Not on file  Tobacco Use  . Smoking status: Former Smoker    Packs/day: 2.00    Years: 25.00    Pack years: 50.00    Types: Cigarettes    Quit date: 03/10/1985    Years since  quitting: 35.8  . Smokeless tobacco: Never Used  Vaping Use  . Vaping Use: Never used  Substance and Sexual Activity  . Alcohol use: Not Currently    Alcohol/week: 0.0 standard drinks    Comment: socially  . Drug use: No  . Sexual activity: Yes    Birth control/protection: Surgical  Other Topics Concern  . Not on file  Social History Narrative   Lives in [place] and [place] 6 months here and there owns an apt in [place] alone, but helps to care for  mother who is 42 years (goes between the kids)   Sister is in [place] is in [place]      Son who is 87 years old lives in [place], [place]    Has 6 grand kids and 7 great grands      Enjoys exercise (harder since COVID)-dancing      Diet: eats all foods-enjoys smoothies, avoids fried foods, limited red meat if any   Caffeine: coffee 1 cup in the morning-sometimes will have 1 in evening    Water: 3-4 bottles daily       Wears seat belt   Smoke detectors in home   Does not use phone while driving            Social Determinants of Health   Financial Resource Strain: Medium Risk  . Difficulty of Paying Living Expenses: Somewhat hard  Food Insecurity: Food Insecurity Present  . Worried About Charity fundraiser in the Last Year: Never true  . Ran Out of Food in the Last Year: Sometimes true  Transportation Needs: No Transportation Needs  . Lack of Transportation (Medical): No  . Lack of Transportation (Non-Medical): No  Physical Activity: Inactive  . Days of Exercise per Week: 0 days  . Minutes of Exercise per Session: 0 min  Stress: Stress Concern Present  . Feeling of Stress : To some extent  Social Connections: Moderately Integrated  . Frequency of Communication with Friends and Family: More than three times a week  . Frequency of Social Gatherings with Friends and Family: Three times a week  . Attends Religious Services: More than 4 times per year  . Active Member of Clubs or Organizations: Yes  . Attends Archivist Meetings: 1 to 4 times per year  . Marital Status: Widowed  Intimate Partner Violence: Not At Risk  . Fear of Current or Ex-Partner: No  . Emotionally Abused: No  . Physically Abused: No  . Sexually Abused: No    FAMILY HISTORY:  Family History  Problem Relation Age of Onset  . Colon cancer Mother        In her 55s  . Breast cancer Sister   . Stomach cancer Maternal Aunt   . Healthy Brother   . Stroke Paternal Grandmother   . Heart  attack Paternal Grandmother     CURRENT MEDICATIONS:  Current Outpatient Medications  Medication Sig Dispense Refill  . ferrous sulfate 325 (65 FE) MG EC tablet Take 325 mg by mouth in the morning and at bedtime.    Marland Kitchen amLODipine (NORVASC) 10 MG tablet TAKE 1 TABLET BY MOUTH EVERY DAY 90 tablet 1  . ASPIRIN 81 PO Take 81 mg by mouth daily.    . Calcium Carb-Cholecalciferol (CALTRATE 600+D3) 600-800 MG-UNIT TABS Take 2 tablets by mouth 3 (three) times daily after meals.    . clopidogrel (PLAVIX) 75 MG tablet TAKE 1 TABLET BY MOUTH  DAILY 90 tablet 3  . meloxicam (MOBIC) 7.5 MG tablet Take 7.5 mg by mouth daily.    . Multiple Vitamin (MULTIVITAMIN WITH MINERALS) TABS tablet Take 1 tablet by mouth daily.    Marland Kitchen olmesartan-hydrochlorothiazide (BENICAR HCT) 40-25 MG tablet Take 1 tablet by mouth daily. 90 tablet 1  . rosuvastatin (CRESTOR) 40 MG tablet TAKE 1 TABLET BY MOUTH EVERY DAY 90 tablet 1  . traZODone (DESYREL) 50 MG tablet Take 0.5-1 tablets (25-50 mg total) by mouth at bedtime as needed for sleep. (Patient not taking: Reported on 01/20/2021) 30 tablet 3  . valACYclovir (VALTREX) 500 MG tablet Take 500 mg by mouth 2 (two) times daily.     No current facility-administered medications for this visit.    ALLERGIES:  No Known Allergies  PHYSICAL EXAM:  Performance status (ECOG): 1 - Symptomatic but completely ambulatory  Vitals:   01/20/21 1600  BP: (!) 169/66   Wt Readings from Last 3 Encounters:  01/14/21 169 lb (76.7 kg)  01/02/21 168 lb (76.2 kg)  11/27/20 166 lb (75.3 kg)   Physical Exam Vitals reviewed.  Constitutional:      Appearance: Normal appearance. She is obese.  Cardiovascular:     Rate and Rhythm: Normal rate and regular rhythm.     Pulses: Normal pulses.     Heart sounds: Normal heart sounds.  Pulmonary:     Effort: Pulmonary effort is normal.     Breath sounds: Normal breath sounds.  Musculoskeletal:     Right lower leg: No edema.     Left lower leg: No  edema.  Neurological:     General: No focal deficit present.     Mental Status: She is alert and oriented to person, place, and time.  Psychiatric:        Mood and Affect: Mood normal.        Behavior: Behavior normal.     LABORATORY DATA:  I have reviewed the labs as listed.  CBC Latest Ref Rng & Units 12/08/2020 09/04/2020 06/06/2020  WBC 4.0 - 10.5 K/uL 4.7 4.9 4.3  Hemoglobin 12.0 - 15.0 g/dL 14.3 14.4 12.9  Hematocrit 36.0 - 46.0 % 46.1(H) 45.5 41.4  Platelets 150 - 400 K/uL 261 253 240   CMP Latest Ref Rng & Units 04/24/2020 04/16/2020 03/15/2020  Glucose 70 - 99 mg/dL 81 94 98  BUN 8 - 23 mg/dL 15 14 17   Creatinine 0.44 - 1.00 mg/dL 1.05(H) 1.05(H) 1.16(H)  Sodium 135 - 145 mmol/L 142 143 144  Potassium 3.5 - 5.1 mmol/L 3.6 4.1 3.7  Chloride 98 - 111 mmol/L 107 105 111  CO2 22 - 32 mmol/L 26 25 26   Calcium 8.9 - 10.3 mg/dL 9.8 9.9 8.8(L)  Total Protein 6.5 - 8.1 g/dL 7.9 7.2 -  Total Bilirubin 0.3 - 1.2 mg/dL 0.4 0.3 -  Alkaline Phos 38 - 126 U/L 70 85 -  AST 15 - 41 U/L 28 25 -  ALT 0 - 44 U/L 26 22 -      Component Value Date/Time   RBC 5.29 (H) 12/08/2020 1419   MCV 87.1 12/08/2020 1419   MCV 70 (L) 04/16/2020 1141   MCH 27.0 12/08/2020 1419   MCHC 31.0 12/08/2020 1419   RDW 12.6 12/08/2020 1419   RDW 24.9 (H) 04/16/2020 1141   LYMPHSABS 1.8 12/08/2020 1419   MONOABS 0.5 12/08/2020 1419   EOSABS 0.2 12/08/2020 1419   BASOSABS 0.0 12/08/2020 1419    DIAGNOSTIC IMAGING:  I  have independently reviewed the scans and discussed with the patient. No results found.   ASSESSMENT:  1. Microcytic anemia: -CBC on 04/16/2020 shows hemoglobin 10.2 with MCV of 70. -Recent drop in hemoglobin to 6 and MCV to 64.7 on 03/14/2020, status post 2 units PRBC transfusion. Ferritin was 3 with normal folic acid and T15. -Colonoscopy on 02/01/2020 shows diverticulosis in the sigmoid colon and the descending colon. -EGD on 03/27/2020 shows normal esophagus, normal stomach, 2 small  duodenal erosions. -CTAP on 03/15/2020 shows normal liver and normal-sized spleen. -She was taking iron tablet daily for 4 months prior to hospitalization and transfusion. -We will check for nutritional deficiencies which were negative. -SPEP was negative. -Feraheme on 05/02/2020 and 05/09/2020.  2. Family history: -Sister had breast cancer. Mother had colon cancer. Maternal aunt had stomach cancer.   PLAN:  1. Microcytic anemia: -Her energy levels are stable. - When she remembers she takes iron tablet twice daily.  But most of the time she takes once daily. - She reports that she has been craving ice chips for the last 1 week. - Reviewed labs from 12/08/2020 which showed hemoglobin 14.3.  Percent saturation is 19.  Ferritin was not done. - I have recommended her to increase iron tablet to twice daily. - RTC 4 months with repeat labs.  2. Health maintenance: -Mammogram on 05/01/2020 was negative.  Orders placed this encounter:  Orders Placed This Encounter  Procedures  . Iron and TIBC  . Ferritin  . CBC with Differential/Platelet     Derek Jack, MD [place] [phone]   I, Thana Ates, am acting as a scribe for Dr. Derek Jack.  I, Derek Jack MD, have reviewed the above documentation for accuracy and completeness, and I agree with the above.

## 2021-01-20 ENCOUNTER — Encounter (HOSPITAL_COMMUNITY): Payer: Self-pay | Admitting: Hematology

## 2021-01-20 ENCOUNTER — Inpatient Hospital Stay (HOSPITAL_COMMUNITY): Payer: Medicare Other | Attending: Hematology | Admitting: Hematology

## 2021-01-20 ENCOUNTER — Other Ambulatory Visit: Payer: Self-pay

## 2021-01-20 VITALS — BP 169/66

## 2021-01-20 DIAGNOSIS — R002 Palpitations: Secondary | ICD-10-CM | POA: Diagnosis not present

## 2021-01-20 DIAGNOSIS — M7989 Other specified soft tissue disorders: Secondary | ICD-10-CM | POA: Diagnosis not present

## 2021-01-20 DIAGNOSIS — Z800 Family history of malignant neoplasm of digestive organs: Secondary | ICD-10-CM | POA: Insufficient documentation

## 2021-01-20 DIAGNOSIS — K5730 Diverticulosis of large intestine without perforation or abscess without bleeding: Secondary | ICD-10-CM | POA: Insufficient documentation

## 2021-01-20 DIAGNOSIS — I10 Essential (primary) hypertension: Secondary | ICD-10-CM | POA: Diagnosis not present

## 2021-01-20 DIAGNOSIS — Z823 Family history of stroke: Secondary | ICD-10-CM | POA: Diagnosis not present

## 2021-01-20 DIAGNOSIS — E782 Mixed hyperlipidemia: Secondary | ICD-10-CM | POA: Insufficient documentation

## 2021-01-20 DIAGNOSIS — Z87891 Personal history of nicotine dependence: Secondary | ICD-10-CM | POA: Diagnosis not present

## 2021-01-20 DIAGNOSIS — R5383 Other fatigue: Secondary | ICD-10-CM | POA: Insufficient documentation

## 2021-01-20 DIAGNOSIS — R0602 Shortness of breath: Secondary | ICD-10-CM | POA: Insufficient documentation

## 2021-01-20 DIAGNOSIS — K269 Duodenal ulcer, unspecified as acute or chronic, without hemorrhage or perforation: Secondary | ICD-10-CM | POA: Insufficient documentation

## 2021-01-20 DIAGNOSIS — Z803 Family history of malignant neoplasm of breast: Secondary | ICD-10-CM | POA: Insufficient documentation

## 2021-01-20 DIAGNOSIS — G479 Sleep disorder, unspecified: Secondary | ICD-10-CM | POA: Diagnosis not present

## 2021-01-20 DIAGNOSIS — Z8249 Family history of ischemic heart disease and other diseases of the circulatory system: Secondary | ICD-10-CM | POA: Insufficient documentation

## 2021-01-20 DIAGNOSIS — D509 Iron deficiency anemia, unspecified: Secondary | ICD-10-CM | POA: Insufficient documentation

## 2021-01-20 DIAGNOSIS — Z79899 Other long term (current) drug therapy: Secondary | ICD-10-CM | POA: Diagnosis not present

## 2021-01-20 DIAGNOSIS — Z8616 Personal history of COVID-19: Secondary | ICD-10-CM | POA: Diagnosis not present

## 2021-01-20 DIAGNOSIS — I252 Old myocardial infarction: Secondary | ICD-10-CM | POA: Insufficient documentation

## 2021-01-20 NOTE — Patient Instructions (Signed)
[facility] °Discharge Instructions ° °You were seen today by Dr. Katragadda. He went over your recent results. Dr. Katragadda will see you back in 4 months for labs and follow up. ° ° °Thank you for choosing [facility] to provide your oncology and hematology care.  To afford each patient quality time with our provider, please arrive at least 15 minutes before your scheduled appointment time.  ° °If you have a lab appointment with the Cancer Center please come in thru the Main Entrance and check in at the main information desk ° °You need to re-schedule your appointment should you arrive 10 or more minutes late.  We strive to give you quality time with our providers, and arriving late affects you and other patients whose appointments are after yours.  Also, if you no show three or more times for appointments you may be dismissed from the clinic at the providers discretion.     °Again, thank you for choosing [facility].  Our hope is that these requests will decrease the amount of time that you wait before being seen by our physicians.       °_____________________________________________________________ ° °Should you have questions after your visit to [facility], please contact our office at [phone] between the hours of 8:00 a.m. and 4:30 p.m.  Voicemails left after 4:00 p.m. will not be returned until the following business day.  For prescription refill requests, have your pharmacy contact our office and allow 72 hours.   ° °Cancer Center Support Programs:  ° °> Cancer Support Group  °2nd Tuesday of the month 1pm-2pm, Journey Room  ° ° °

## 2021-01-29 DIAGNOSIS — H25813 Combined forms of age-related cataract, bilateral: Secondary | ICD-10-CM | POA: Diagnosis not present

## 2021-01-29 DIAGNOSIS — H40013 Open angle with borderline findings, low risk, bilateral: Secondary | ICD-10-CM | POA: Diagnosis not present

## 2021-01-29 DIAGNOSIS — H01002 Unspecified blepharitis right lower eyelid: Secondary | ICD-10-CM | POA: Diagnosis not present

## 2021-01-29 DIAGNOSIS — H01004 Unspecified blepharitis left upper eyelid: Secondary | ICD-10-CM | POA: Diagnosis not present

## 2021-01-29 DIAGNOSIS — H01001 Unspecified blepharitis right upper eyelid: Secondary | ICD-10-CM | POA: Diagnosis not present

## 2021-02-03 DIAGNOSIS — E785 Hyperlipidemia, unspecified: Secondary | ICD-10-CM | POA: Diagnosis not present

## 2021-02-03 DIAGNOSIS — D509 Iron deficiency anemia, unspecified: Secondary | ICD-10-CM | POA: Diagnosis not present

## 2021-02-03 DIAGNOSIS — I10 Essential (primary) hypertension: Secondary | ICD-10-CM | POA: Diagnosis not present

## 2021-02-04 LAB — LIPID PANEL WITH LDL/HDL RATIO
Cholesterol, Total: 121 mg/dL (ref 100–199)
HDL: 45 mg/dL (ref >39)
LDL Chol Calc (NIH): 53 mg/dL (ref 0–99)
LDL/HDL Ratio: 1.2 ratio (ref 0.0–3.2)
Triglycerides: 130 mg/dL (ref 0–149)
VLDL Cholesterol Cal: 23 mg/dL (ref 5–40)

## 2021-02-04 LAB — CBC WITH DIFFERENTIAL/PLATELET
Basophils Absolute: 0.1 10*3/uL (ref 0.0–0.2)
Basos: 1 %
EOS (ABSOLUTE): 0.2 10*3/uL (ref 0.0–0.4)
Eos: 5 %
Hematocrit: 41.5 % (ref 34.0–46.6)
Hemoglobin: 13.4 g/dL (ref 11.1–15.9)
Immature Grans (Abs): 0 10*3/uL (ref 0.0–0.1)
Immature Granulocytes: 0 %
Lymphocytes Absolute: 2 10*3/uL (ref 0.7–3.1)
Lymphs: 45 %
MCH: 27.1 pg (ref 26.6–33.0)
MCHC: 32.3 g/dL (ref 31.5–35.7)
MCV: 84 fL (ref 79–97)
Monocytes Absolute: 0.4 10*3/uL (ref 0.1–0.9)
Monocytes: 8 %
Neutrophils Absolute: 1.8 10*3/uL (ref 1.4–7.0)
Neutrophils: 41 %
Platelets: 272 10*3/uL (ref 150–450)
RBC: 4.94 x10E6/uL (ref 3.77–5.28)
RDW: 13.6 % (ref 11.7–15.4)
WBC: 4.4 10*3/uL (ref 3.4–10.8)

## 2021-02-04 LAB — CMP14+EGFR
ALT: 52 IU/L — ABNORMAL HIGH (ref 0–32)
AST: 57 IU/L — ABNORMAL HIGH (ref 0–40)
Albumin/Globulin Ratio: 2 (ref 1.2–2.2)
Albumin: 4.6 g/dL (ref 3.7–4.7)
Alkaline Phosphatase: 104 IU/L (ref 44–121)
BUN/Creatinine Ratio: 16 (ref 12–28)
BUN: 17 mg/dL (ref 8–27)
Bilirubin Total: 0.5 mg/dL (ref 0.0–1.2)
CO2: 25 mmol/L (ref 20–29)
Calcium: 10.2 mg/dL (ref 8.7–10.3)
Chloride: 107 mmol/L — ABNORMAL HIGH (ref 96–106)
Creatinine, Ser: 1.08 mg/dL — ABNORMAL HIGH (ref 0.57–1.00)
Globulin, Total: 2.3 g/dL (ref 1.5–4.5)
Glucose: 101 mg/dL — ABNORMAL HIGH (ref 65–99)
Potassium: 3.9 mmol/L (ref 3.5–5.2)
Sodium: 147 mmol/L — ABNORMAL HIGH (ref 134–144)
Total Protein: 6.9 g/dL (ref 6.0–8.5)
eGFR: 54 mL/min/{1.73_m2} — ABNORMAL LOW (ref >59)

## 2021-02-04 NOTE — Progress Notes (Signed)
Labs look OK. We will discuss them at her appt.

## 2021-02-06 ENCOUNTER — Ambulatory Visit (INDEPENDENT_AMBULATORY_CARE_PROVIDER_SITE_OTHER): Payer: Medicare Other | Admitting: Nurse Practitioner

## 2021-02-06 ENCOUNTER — Other Ambulatory Visit: Payer: Self-pay

## 2021-02-06 ENCOUNTER — Encounter: Payer: Self-pay | Admitting: Nurse Practitioner

## 2021-02-06 VITALS — BP 132/69 | HR 75 | Temp 97.1°F | Ht 60.0 in | Wt 168.0 lb

## 2021-02-06 DIAGNOSIS — R7989 Other specified abnormal findings of blood chemistry: Secondary | ICD-10-CM | POA: Insufficient documentation

## 2021-02-06 DIAGNOSIS — D509 Iron deficiency anemia, unspecified: Secondary | ICD-10-CM

## 2021-02-06 DIAGNOSIS — E785 Hyperlipidemia, unspecified: Secondary | ICD-10-CM | POA: Diagnosis not present

## 2021-02-06 DIAGNOSIS — R945 Abnormal results of liver function studies: Secondary | ICD-10-CM | POA: Diagnosis not present

## 2021-02-06 DIAGNOSIS — I10 Essential (primary) hypertension: Secondary | ICD-10-CM

## 2021-02-06 NOTE — Assessment & Plan Note (Signed)
BP Readings from Last 3 Encounters:  02/06/21 132/69  01/20/21 (!) 169/66  01/14/21 (!) 158/76   -BP well controlled -continue current medications

## 2021-02-06 NOTE — Patient Instructions (Signed)
Please have fasting labs drawn 2-3 days prior to your appointment so we can discuss the results during your office visit.  

## 2021-02-06 NOTE — Progress Notes (Signed)
Established Patient Office Visit  Subjective:  Patient ID: Nicole Horn, female    DOB: 07/19/1946  Age: 75 y.o. MRN: [id number]  CC:  Chief Complaint  Patient presents with   Hypertension    Follow up    HPI Reshanda Lewey presents for lab follow-up.  She has hx of HTN. No adverse med effects. No acute concerns today.  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   Type 2 diabetes mellitus ([place])     Past Surgical History:  Procedure Laterality Date   ABDOMINAL HYSTERECTOMY     BACK SURGERY     BARTHOLIN GLAND CYST EXCISION Left 03/04/2015   Procedure: EXCISION OF LEFT BARTHOLINS TUMOR;  Surgeon: [name], MD;  Location: [facility] ORS;  Service: Gynecology;  Laterality: Left;   CERVICAL SPINE SURGERY     COLONOSCOPY N/A 02/01/2020   Procedure: COLONOSCOPY;  Surgeon: Daneil Dolin, MD; Scattered medium mouth diverticula in the sigmoid and descending colon, otherwise normal exam.   CYST REMOVAL TRUNK     ESOPHAGOGASTRODUODENOSCOPY (EGD) WITH PROPOFOL N/A 03/27/2020   Procedure: ESOPHAGOGASTRODUODENOSCOPY (EGD) WITH PROPOFOL;  Surgeon: Daneil Dolin, MD;  Location: [facility];  Service: Endoscopy;  Laterality: N/A;  1:00pm   GIVENS CAPSULE STUDY N/A 03/27/2020   Procedure: GIVENS CAPSULE STUDY;  Surgeon: Daneil Dolin, MD;  Location: [facility];  Service: Endoscopy;  Laterality: N/A;   Multiple cyst removal surgeries     Stent  of lower extremities      Family History  Problem Relation Age of Onset   Colon cancer Mother        In her 32s   Breast cancer Sister    Stomach cancer Maternal Aunt    Healthy Brother    Stroke Paternal Grandmother    Heart attack Paternal Grandmother     Social History   Socioeconomic History   Marital status: Widowed    Spouse name: Not on file   Number of children: 1   Years of education: Not on file   Highest education level: Some college, no degree  Occupational History   Not on file  Tobacco Use   Smoking status: Former    Packs/day: 2.00    Years: 25.00    Pack years: 50.00    Types: Cigarettes    Quit date: 03/10/1985    Years since quitting: 35.9   Smokeless tobacco: Never  Vaping Use   Vaping Use: Never used  Substance and Sexual Activity   Alcohol use: Not Currently    Alcohol/week: 0.0 standard drinks    Comment: socially   Drug use: No   Sexual activity: Yes    Birth control/protection: Surgical  Other Topics Concern   Not on file  Social History Narrative   Lives in [place] 6 months here and there owns an apt in [place]  Lives alone, but helps to care for mother who is 3 years (goes between the kids)   Sister is in [place] is in [place]      Son who is 36 years old lives in [place], [place]    Has 6 grand kids and 7 great grands      Enjoys exercise (harder since COVID)-dancing      Diet: eats all foods-enjoys smoothies, avoids fried foods, limited red meat if any   Caffeine: coffee 1 cup in the morning-sometimes will have 1 in evening    Water: 3-4 bottles daily       Wears seat belt   Smoke detectors in home   Does not use phone while driving            Social Determinants of Health   Financial Resource Strain: Medium Risk   Difficulty of Paying Living Expenses: Somewhat hard  Food Insecurity: Landscape architect Present   Worried About Charity fundraiser in the Last Year: Never true   Ran Out of Food in  the Last Year: Sometimes true  Transportation Needs: No Transportation Needs   Lack of Transportation (Medical): No   Lack of Transportation (Non-Medical): No  Physical Activity: Inactive   Days of Exercise per Week: 0 days   Minutes of Exercise per Session: 0 min  Stress: Stress Concern Present   Feeling of Stress : To some extent  Social Connections: Moderately Integrated   Frequency of Communication with Friends and Family: More than three times a week   Frequency of Social Gatherings with Friends and Family: Three times a week   Attends Religious Services: More than 4 times per year   Active Member of Clubs or Organizations: Yes   Attends Archivist Meetings: 1 to 4 times per year   Marital Status: Widowed  Human resources officer Violence: Not At Risk   Fear of Current or Ex-Partner: No   Emotionally Abused: No   Physically Abused: No   Sexually Abused: No    Outpatient Medications Prior to Visit  Medication Sig Dispense Refill   amLODipine (NORVASC) 10 MG tablet TAKE 1 TABLET BY MOUTH EVERY DAY 90 tablet 1   ASPIRIN 81 PO Take 81 mg by mouth daily.     Calcium Carb-Cholecalciferol (CALTRATE 600+D3) 600-800 MG-UNIT TABS Take 2 tablets by mouth 3 (three) times daily after meals.     clopidogrel (PLAVIX) 75 MG tablet TAKE 1 TABLET BY MOUTH  DAILY 90 tablet 3   ferrous sulfate 325 (65 FE) MG EC tablet Take 325 mg by mouth in the morning and at bedtime.     meloxicam (MOBIC) 7.5 MG tablet Take 7.5 mg by mouth daily.     Multiple Vitamin (MULTIVITAMIN WITH MINERALS) TABS tablet Take 1 tablet by mouth daily.     olmesartan-hydrochlorothiazide (BENICAR HCT) 40-25 MG tablet Take 1 tablet by mouth daily. 90 tablet 1   rosuvastatin (CRESTOR) 40 MG tablet TAKE 1 TABLET BY MOUTH EVERY DAY 90 tablet 1   traZODone (DESYREL) 50 MG tablet Take 0.5-1 tablets (25-50 mg total) by mouth at bedtime as needed for sleep. 30 tablet 3   valACYclovir (VALTREX) 500 MG tablet Take 500 mg by mouth 2  (two) times daily.     No facility-administered medications prior to visit.    No Known Allergies  ROS Review of Systems  Constitutional: Negative.   Respiratory: Negative.    Cardiovascular: Negative.   Musculoskeletal: Negative.   Psychiatric/Behavioral:  Negative.       Objective:    Physical Exam Constitutional:      Appearance: Normal appearance.  Cardiovascular:     Rate and Rhythm: Normal rate and regular rhythm.     Pulses: Normal pulses.     Heart sounds: Normal heart sounds.  Pulmonary:     Effort: Pulmonary effort is normal.     Breath sounds: Normal breath sounds.  Musculoskeletal:        General: Normal range of motion.  Neurological:     Mental Status: She is alert.  Psychiatric:        Mood and Affect: Mood normal.        Behavior: Behavior normal.        Thought Content: Thought content normal.        Judgment: Judgment normal.    BP 132/69 (BP Location: Right Arm, Patient Position: Sitting, Cuff Size: Large)   Pulse 75   Temp (!) 97.1 F (36.2 C) (Temporal)   Ht 5' (1.524 m)   Wt 168 lb (76.2 kg)   SpO2 95%   BMI 32.81 kg/m  Wt Readings from Last 3 Encounters:  02/06/21 168 lb (76.2 kg)  01/14/21 169 lb (76.7 kg)  01/02/21 168 lb (76.2 kg)     Health Maintenance Due  Topic Date Due   PNA vac Low Risk Adult (1 of 2 - PCV13) Never done    There are no preventive care reminders to display for this patient.  No results found for: TSH Lab Results  Component Value Date   WBC 4.4 02/03/2021   HGB 13.4 02/03/2021   HCT 41.5 02/03/2021   MCV 84 02/03/2021   PLT 272 02/03/2021   Lab Results  Component Value Date   NA 147 (H) 02/03/2021   K 3.9 02/03/2021   CO2 25 02/03/2021   GLUCOSE 101 (H) 02/03/2021   BUN 17 02/03/2021   CREATININE 1.08 (H) 02/03/2021   BILITOT 0.5 02/03/2021   ALKPHOS 104 02/03/2021   AST 57 (H) 02/03/2021   ALT 52 (H) 02/03/2021   PROT 6.9 02/03/2021   ALBUMIN 4.6 02/03/2021   CALCIUM 10.2 02/03/2021    ANIONGAP 9 04/24/2020   EGFR 54 (L) 02/03/2021   Lab Results  Component Value Date   CHOL 121 02/03/2021   Lab Results  Component Value Date   HDL 45 02/03/2021   Lab Results  Component Value Date   LDLCALC 53 02/03/2021   Lab Results  Component Value Date   TRIG 130 02/03/2021   Lab Results  Component Value Date   CHOLHDL 2.3 12/12/2019   Lab Results  Component Value Date   HGBA1C 5.7 (H) 12/12/2019      Assessment & Plan:   Problem List Items Addressed This Visit       Cardiovascular and Mediastinum   Essential hypertension - Primary    BP Readings from Last 3 Encounters:  02/06/21 132/69  01/20/21 (!) 169/66  01/14/21 (!) 158/76  -BP well controlled -continue current medications       Relevant Orders   CBC with Differential/Platelet   CMP14+EGFR   Lipid Panel With LDL/HDL Ratio     Other   Hyperlipemia    Lab Results  Component Value Date   CHOL 121 02/03/2021   HDL 45 02/03/2021   LDLCALC 53 02/03/2021   TRIG 130 02/03/2021   CHOLHDL 2.3 12/12/2019  -continue meds       Relevant Orders   Lipid  Panel With LDL/HDL Ratio   Iron deficiency anemia    -she is followed by hematology today -will add-on iron panel -has elevated LFTs, want to make sure her iron levels aren't supertherapeutic -she is followed by Dr. Raliegh [facility]p at [facility]       LFTs abnormal    -lipid panel is great and she has been on rosuvastatin for a long time -will add-oniron level to see if she is supertherapeutic -otherwise will monitor with routine labs -denies acute abdominal issues today       Relevant Orders   CMP14+EGFR    No orders of the defined types were placed in this encounter.   Follow-up: Return in about 3 months (around 05/09/2021) for Lab follow-up (HTN, HLD, LFTs).    Noreene Larsson, NP

## 2021-02-06 NOTE — Assessment & Plan Note (Signed)
-  lipid panel is great and she has been on rosuvastatin for a long time -will add-oniron level to see if she is supertherapeutic -otherwise will monitor with routine labs -denies acute abdominal issues today

## 2021-02-06 NOTE — Assessment & Plan Note (Signed)
Lab Results  Component Value Date   CHOL 121 02/03/2021   HDL 45 02/03/2021   LDLCALC 53 02/03/2021   TRIG 130 02/03/2021   CHOLHDL 2.3 12/12/2019   -continue meds

## 2021-02-06 NOTE — Assessment & Plan Note (Addendum)
-  she is followed by hematology today -will add-on iron panel -has elevated LFTs, want to make sure her iron levels aren't supertherapeutic -she is followed by Dr. Raliegh Ip at [facility] cancer center

## 2021-02-10 ENCOUNTER — Other Ambulatory Visit: Payer: Self-pay | Admitting: Nurse Practitioner

## 2021-02-10 LAB — FERRITIN: Ferritin: 444 ng/mL — ABNORMAL HIGH (ref 15–150)

## 2021-02-10 LAB — IRON AND TIBC
Iron Saturation: 56 % — ABNORMAL HIGH (ref 15–55)
Iron: 169 ug/dL — ABNORMAL HIGH (ref 27–139)
Total Iron Binding Capacity: 303 ug/dL (ref 250–450)
UIBC: 134 ug/dL (ref 118–369)

## 2021-02-10 LAB — SPECIMEN STATUS REPORT

## 2021-02-10 NOTE — Progress Notes (Signed)
Her iron levels are elevated, so she can stop her iron supplement. She was taking this twice a day. When we recheck labs in a few weeks we may restart it at once per day, so don't throw the supplement away.

## 2021-02-10 NOTE — Progress Notes (Signed)
Pt informed

## 2021-02-12 DIAGNOSIS — R002 Palpitations: Secondary | ICD-10-CM | POA: Diagnosis not present

## 2021-02-24 DIAGNOSIS — Z1152 Encounter for screening for COVID-19: Secondary | ICD-10-CM | POA: Diagnosis not present

## 2021-02-24 DIAGNOSIS — Z20822 Contact with and (suspected) exposure to covid-19: Secondary | ICD-10-CM | POA: Diagnosis not present

## 2021-02-26 ENCOUNTER — Ambulatory Visit: Payer: Medicare Other | Admitting: Nurse Practitioner

## 2021-03-10 ENCOUNTER — Ambulatory Visit: Payer: Medicare Other

## 2021-03-10 ENCOUNTER — Other Ambulatory Visit: Payer: Self-pay

## 2021-03-10 LAB — HM DIABETES EYE EXAM

## 2021-03-12 ENCOUNTER — Other Ambulatory Visit: Payer: Self-pay

## 2021-03-12 ENCOUNTER — Telehealth: Payer: Self-pay | Admitting: Nurse Practitioner

## 2021-03-12 MED ORDER — ROSUVASTATIN CALCIUM 40 MG PO TABS: 40.0000 mg | ORAL_TABLET | Freq: Every day | ORAL | 1 refills | Status: DC

## 2021-03-12 NOTE — Telephone Encounter (Signed)
Refill sent.

## 2021-03-12 NOTE — Telephone Encounter (Signed)
Pt needs crestor called in

## 2021-03-16 ENCOUNTER — Other Ambulatory Visit: Payer: Self-pay

## 2021-03-16 MED ORDER — AMLODIPINE BESYLATE 10 MG PO TABS
10.0000 mg | ORAL_TABLET | Freq: Every day | ORAL | 1 refills | Status: DC
Start: 2021-03-16 — End: 2022-08-12

## 2021-03-17 ENCOUNTER — Ambulatory Visit: Payer: Medicare Other | Admitting: Nurse Practitioner

## 2021-03-23 ENCOUNTER — Other Ambulatory Visit: Payer: Self-pay

## 2021-03-23 DIAGNOSIS — H25811 Combined forms of age-related cataract, right eye: Secondary | ICD-10-CM | POA: Diagnosis not present

## 2021-03-23 MED ORDER — ROSUVASTATIN CALCIUM 40 MG PO TABS: 40.0000 mg | ORAL_TABLET | Freq: Every day | ORAL | 1 refills | Status: DC

## 2021-03-24 ENCOUNTER — Encounter (HOSPITAL_COMMUNITY)
Admission: RE | Admit: 2021-03-24 | Discharge: 2021-03-24 | Disposition: A | Discharge status: Home / Self Care | Payer: Medicare Other | Source: Ambulatory Visit | Attending: Ophthalmology | Admitting: Ophthalmology

## 2021-03-24 ENCOUNTER — Other Ambulatory Visit: Payer: Self-pay

## 2021-03-24 NOTE — H&P (Signed)
Surgical History & Physical  Patient Name: Nicole Horn DOB: 1945/11/09  Surgery: Cataract extraction with intraocular lens implant phacoemulsification; Right Eye  Surgeon: Baruch Goldmann MD Surgery Date:  03/27/2021 Pre-Op Date:  03/23/2021  HPI: A 36 Yr. old female patient is referred by dr Jorja Loa for cataract eval. 1. The patient complains of difficulty when viewing TV, reading closed caption, news scrolls on TV/reading, which began 1 year ago. Both eyes are affected. The episode is constant. The condition's severity increased since last visit. Symptoms occur when the patient is inside and outside. This is negatively affecting her quality of life. Using ATs for dry eyes. HPI Completed by Dr. Baruch Goldmann  Medical History: Cataracts High Blood Pressure Hypercholesterolemia, Heart disease, Diabetic susp...  Review of Systems Negative Allergic/Immunologic Negative Cardiovascular Negative Constitutional Negative Ear, Nose, Mouth & Throat Negative Endocrine Negative Eyes Negative Gastrointestinal Negative Genitourinary Negative Hemotologic/Lymphatic Negative Integumentary Negative Musculoskeletal Negative Neurological Negative Psychiatry Negative Respiratory  Social   Never smoked   Medication Albuterol inhaler, Amlodipine, Aspirin, Meloxicam, Rosuvastatin, Olmesartan, HCTZ, Multivitamin,   Sx/Procedures Hysterectomy, Lumpectomy,   Drug Allergies   NKDA  History & Physical: Heent:  Cataract, Right eye NECK: supple without bruits LUNGS: lungs clear to auscultation CV: regular rate and rhythm Abdomen: soft and non-tender  Impression & Plan: Assessment: 1.  COMBINED FORMS AGE RELATED CATARACT; Both Eyes (H25.813) 2.  BLEPHARITIS; Right Upper Lid, Right Lower Lid, Left Upper Lid, Left Lower Lid (H01.001, H01.002,H01.004,H01.005) 3.  CONJUNCTIVOCHALASIS; Both Eyes (H11.823) 4.  ASTIGMATISM, REGULAR; Both Eyes (H52.223) 5.  OAG BORDERLINE FINDINGS LOW RISK;  Both Eyes (H40.013)  Plan: 1.  Cataract accounts for the patient's decreased vision. This visual impairment is not correctable with a tolerable change in glasses or contact lenses. Cataract surgery with an implantation of a new lens should significantly improve the visual and functional status of the patient. Discussed all risks, benefits, alternatives, and potential complications. Discussed the procedures and recovery. Patient desires to have surgery. A-scan ordered and performed today for intra-ocular lens calculations. The surgery will be performed in order to improve vision for driving, reading, and for eye examinations. Recommend phacoemulsification with intra-ocular lens. Recommend Dextenza for post-operative pain and inflammation. Right Eye worse and non-dominant - first. Dilates well - shugarcaine by protocol. Omidira. Toric Lens.  2.  Recommended regular lid cleaning.  3.  Asymptomatic. Symptomatic.  4.  Toric Lens.  5.  Based on cup-to-disc ratio. Negative Family history. OCT rNFL shows: thinning OD, WNL OS. IOP WNL OU. Detailed discussion about glaucoma today including importance of maintaining good follow up and following treatment plan, and the possibility of irreversible blindness as part of this disease process.

## 2021-03-27 ENCOUNTER — Encounter (HOSPITAL_COMMUNITY)
Admission: RE | Disposition: A | Discharge status: Home / Self Care | Payer: Self-pay | Source: Home / Self Care | Attending: Ophthalmology

## 2021-03-27 ENCOUNTER — Encounter (HOSPITAL_COMMUNITY): Payer: Self-pay | Admitting: Ophthalmology

## 2021-03-27 ENCOUNTER — Ambulatory Visit (HOSPITAL_COMMUNITY)
Admission: RE | Admit: 2021-03-27 | Discharge: 2021-03-27 | Disposition: A | Discharge status: Home / Self Care | Payer: Medicare Other | Attending: Ophthalmology | Admitting: Ophthalmology

## 2021-03-27 ENCOUNTER — Ambulatory Visit (HOSPITAL_COMMUNITY): Payer: Medicare Other | Admitting: Certified Registered"

## 2021-03-27 DIAGNOSIS — H0100A Unspecified blepharitis right eye, upper and lower eyelids: Secondary | ICD-10-CM | POA: Diagnosis not present

## 2021-03-27 DIAGNOSIS — H52201 Unspecified astigmatism, right eye: Secondary | ICD-10-CM | POA: Diagnosis not present

## 2021-03-27 DIAGNOSIS — H40013 Open angle with borderline findings, low risk, bilateral: Secondary | ICD-10-CM | POA: Insufficient documentation

## 2021-03-27 DIAGNOSIS — H0100B Unspecified blepharitis left eye, upper and lower eyelids: Secondary | ICD-10-CM | POA: Insufficient documentation

## 2021-03-27 DIAGNOSIS — H11823 Conjunctivochalasis, bilateral: Secondary | ICD-10-CM | POA: Insufficient documentation

## 2021-03-27 DIAGNOSIS — Z90710 Acquired absence of both cervix and uterus: Secondary | ICD-10-CM | POA: Insufficient documentation

## 2021-03-27 DIAGNOSIS — H25811 Combined forms of age-related cataract, right eye: Secondary | ICD-10-CM | POA: Insufficient documentation

## 2021-03-27 DIAGNOSIS — H2511 Age-related nuclear cataract, right eye: Secondary | ICD-10-CM | POA: Diagnosis not present

## 2021-03-27 DIAGNOSIS — I2510 Atherosclerotic heart disease of native coronary artery without angina pectoris: Secondary | ICD-10-CM | POA: Diagnosis not present

## 2021-03-27 DIAGNOSIS — E1136 Type 2 diabetes mellitus with diabetic cataract: Secondary | ICD-10-CM | POA: Insufficient documentation

## 2021-03-27 HISTORY — PX: CATARACT EXTRACTION W/PHACO: SHX586

## 2021-03-27 LAB — GLUCOSE, CAPILLARY: Glucose-Capillary: 145 mg/dL — ABNORMAL HIGH (ref 70–99)

## 2021-03-27 SURGERY — PHACOEMULSIFICATION, CATARACT, WITH IOL INSERTION
Anesthesia: Monitor Anesthesia Care | Site: Eye | Laterality: Right

## 2021-03-27 MED ORDER — PHENYLEPHRINE-KETOROLAC 1-0.3 % IO SOLN
INTRAOCULAR | Status: DC | PRN
  Administered 2021-03-27: 500 mL via OPHTHALMIC

## 2021-03-27 MED ORDER — BSS IO SOLN
INTRAOCULAR | Status: DC | PRN
  Administered 2021-03-27: 15 mL via INTRAOCULAR

## 2021-03-27 MED ORDER — POVIDONE-IODINE 5 % OP SOLN
OPHTHALMIC | Status: DC | PRN
  Administered 2021-03-27: 1 via OPHTHALMIC

## 2021-03-27 MED ORDER — MIDAZOLAM HCL 2 MG/2ML IJ SOLN
INTRAMUSCULAR | Status: AC
  Filled 2021-03-27: qty 2

## 2021-03-27 MED ORDER — SODIUM HYALURONATE 23MG/ML IO SOSY
PREFILLED_SYRINGE | INTRAOCULAR | Status: DC | PRN
  Administered 2021-03-27: 0.6 mL via INTRAOCULAR

## 2021-03-27 MED ORDER — LIDOCAINE HCL 3.5 % OP GEL
1.0000 "application " | Freq: Once | OPHTHALMIC | Status: AC
  Administered 2021-03-27: 1 via OPHTHALMIC

## 2021-03-27 MED ORDER — PHENYLEPHRINE HCL 2.5 % OP SOLN
1.0000 [drp] | OPHTHALMIC | Status: AC | PRN
  Administered 2021-03-27 (×3): 1 [drp] via OPHTHALMIC

## 2021-03-27 MED ORDER — SODIUM HYALURONATE 10 MG/ML IO SOLUTION
PREFILLED_SYRINGE | INTRAOCULAR | Status: DC | PRN
  Administered 2021-03-27: 0.85 mL via INTRAOCULAR

## 2021-03-27 MED ORDER — MIDAZOLAM HCL 2 MG/2ML IJ SOLN
INTRAMUSCULAR | Status: DC | PRN
  Administered 2021-03-27: 2 mg via INTRAVENOUS

## 2021-03-27 MED ORDER — TROPICAMIDE 1 % OP SOLN
1.0000 [drp] | OPHTHALMIC | Status: AC
  Administered 2021-03-27 (×3): 1 [drp] via OPHTHALMIC

## 2021-03-27 MED ORDER — STERILE WATER FOR IRRIGATION IR SOLN
Status: DC | PRN
  Administered 2021-03-27: 250 mL

## 2021-03-27 MED ORDER — SODIUM CHLORIDE 0.9% FLUSH
INTRAVENOUS | Status: DC | PRN
  Administered 2021-03-27: 5 mL via INTRAVENOUS

## 2021-03-27 MED ORDER — NEOMYCIN-POLYMYXIN-DEXAMETH 3.5-10000-0.1 OP SUSP
OPHTHALMIC | Status: DC | PRN
  Administered 2021-03-27: 1 [drp] via OPHTHALMIC

## 2021-03-27 MED ORDER — LIDOCAINE HCL (PF) 1 % IJ SOLN
INTRAOCULAR | Status: DC | PRN
  Administered 2021-03-27: .06 mL via OPHTHALMIC

## 2021-03-27 MED ORDER — PHENYLEPHRINE-KETOROLAC 1-0.3 % IO SOLN
INTRAOCULAR | Status: AC
  Filled 2021-03-27: qty 4

## 2021-03-27 MED ORDER — EPINEPHRINE PF 1 MG/ML IJ SOLN
INTRAMUSCULAR | Status: AC
  Filled 2021-03-27: qty 1

## 2021-03-27 MED ORDER — TETRACAINE HCL 0.5 % OP SOLN
1.0000 [drp] | OPHTHALMIC | Status: AC | PRN
  Administered 2021-03-27 (×3): 1 [drp] via OPHTHALMIC

## 2021-03-27 SURGICAL SUPPLY — 13 items
CLOTH BEACON ORANGE TIMEOUT ST (SAFETY) ×2 IMPLANT
EYE SHIELD UNIVERSAL CLEAR (GAUZE/BANDAGES/DRESSINGS) ×2 IMPLANT
GLOVE SURG UNDER POLY LF SZ6.5 (GLOVE) ×2 IMPLANT
GLOVE SURG UNDER POLY LF SZ7 (GLOVE) ×2 IMPLANT
NEEDLE HYPO 18GX1.5 BLUNT FILL (NEEDLE) ×2 IMPLANT
PAD ARMBOARD 7.5X6 YLW CONV (MISCELLANEOUS) ×2 IMPLANT
PROC W SPEC LENS (INTRAOCULAR LENS)
PROCESS W SPEC LENS (INTRAOCULAR LENS) IMPLANT
SYR TB 1ML LL NO SAFETY (SYRINGE) ×2 IMPLANT
TAPE SURG TRANSPORE 1 IN (GAUZE/BANDAGES/DRESSINGS) ×1 IMPLANT
TAPE SURGICAL TRANSPORE 1 IN (GAUZE/BANDAGES/DRESSINGS) ×2
TECHNIS EYHANCE TORIC II IOL (Intraocular Lens) ×2 IMPLANT
WATER STERILE IRR 250ML POUR (IV SOLUTION) ×2 IMPLANT

## 2021-03-27 NOTE — Anesthesia Postprocedure Evaluation (Signed)
Anesthesia Post Note  Patient: Nicole Horn  Procedure(s) Performed: CATARACT EXTRACTION PHACO AND INTRAOCULAR LENS PLACEMENT (IOC) (Right: Eye)  Patient location during evaluation: Phase II Anesthesia Type: MAC Level of consciousness: awake and alert and oriented Pain management: pain level controlled Vital Signs Assessment: post-procedure vital signs reviewed and stable Respiratory status: spontaneous breathing and respiratory function stable Cardiovascular status: blood pressure returned to baseline and stable Postop Assessment: no apparent nausea or vomiting Anesthetic complications: no   No notable events documented.   Last Vitals:  Vitals:   03/27/21 0739 03/27/21 0849  BP: (!) 153/62 (!) 145/63  Pulse: 66   Resp: 20 18  Temp: 37.2 C 36.5 C  SpO2: 97% 98%    Last Pain:  Vitals:   03/27/21 0849  TempSrc: Oral  PainSc: 0-No pain                 [name]

## 2021-03-27 NOTE — Op Note (Signed)
Date of procedure: 03/27/21  Pre-operative diagnosis: Visually significant combined age-related cataract, Right Eye; Visually Significant Astigmatism, Right Eye (H25.811)  Post-operative diagnosis: Visually significant age-related cataract, Right Eye; Visually Significant Astigmatism, Right Eye  Procedure: Removal of cataract via phacoemulsification and insertion of intra-ocular lens Wynetta Emery and Johnson [id number] +18.0D into the capsular bag of the Right Eye  Attending surgeon: Gerda Diss. Ahlijah Raia, MD, MA  Anesthesia: MAC, Topical Akten  Complications: None  Estimated Blood Loss: <71m (minimal)  Specimens: None  Implants: As above  Indications:  Visually significant age-related cataract, Right Eye; Visually Significant Astigmatism, Right Eye  Procedure:  The patient was seen and identified in the pre-operative area. The operative eye was identified and dilated.  The operative eye was marked.  Pre-operative toric markers were used to mark the eye at 0 and 180 degrees. Topical anesthesia was administered to the operative eye.     The patient was then to the operative suite and placed in the supine position.  A timeout was performed confirming the patient, procedure to be performed, and all other relevant information.   The patient's face was prepped and draped in the usual fashion for intra-ocular surgery.  A lid speculum was placed into the operative eye and the surgical microscope moved into place and focused.  A superotemporal paracentesis was created using a 20 gauge paracentesis blade.  Shugarcaine was injected into the anterior chamber.  Viscoelastic was injected into the anterior chamber.  A temporal clear-corneal main wound incision was created using a 2.455mmicrokeratome.  A continuous curvilinear capsulorrhexis was initiated using an irrigating cystitome and completed using capsulorrhexis forceps.  Hydrodissection and hydrodeliniation were performed.  Viscoelastic was injected into the  anterior chamber.  A phacoemulsification handpiece and a chopper as a second instrument were used to remove the nucleus and epinucleus. The irrigation/aspiration handpiece was used to remove any remaining cortical material.   The capsular bag was reinflated with viscoelastic, checked, and found to be intact.  The intraocular lens was inserted into the capsular bag and dialed into place using a Kuglen hook to 31 degrees.  The irrigation/aspiration handpiece was used to remove any remaining viscoelastic.  The clear corneal wound and paracentesis wounds were then hydrated and checked with Weck-Cels to be watertight.  The lid-speculum and drape was removed, and the patient's face was cleaned with a wet and dry 4x4.  Maxitrol was instilled in the eye before a clear shield was taped over the eye. The patient was taken to the post-operative care unit in good condition, having tolerated the procedure well.  Post-Op Instructions: [facility] and will receive all other orders and instructions.

## 2021-03-27 NOTE — Anesthesia Procedure Notes (Signed)
Procedure Name: MAC Date/Time: 03/27/2021 8:35 AM Performed by: Orlie Dakin, CRNA Pre-anesthesia Checklist: Patient identified, Emergency Drugs available, Suction available and Patient being monitored Patient Re-evaluated:Patient Re-evaluated prior to induction Oxygen Delivery Method: Nasal cannula Placement Confirmation: positive ETCO2

## 2021-03-27 NOTE — Interval H&P Note (Signed)
History and Physical Interval Note:  03/27/2021 8:24 AM  Nicole Horn  has presented today for surgery, with the diagnosis of Nuclear sclerotic cataract - Right eye.  The various methods of treatment have been discussed with the patient and family. After consideration of risks, benefits and other options for treatment, the patient has consented to  Procedure(s) with comments: CATARACT EXTRACTION PHACO AND INTRAOCULAR LENS PLACEMENT (IOC) (Right) - right as a surgical intervention.  The patient's history has been reviewed, patient examined, no change in status, stable for surgery.  I have reviewed the patient's chart and labs.  Questions were answered to the patient's satisfaction.     Baruch Goldmann

## 2021-03-27 NOTE — Anesthesia Preprocedure Evaluation (Signed)
Anesthesia Evaluation  Patient identified by MRN, date of birth, ID band Patient awake    Reviewed: Allergy & Precautions, NPO status , Patient's Chart, lab work & pertinent test results  History of Anesthesia Complications Negative for: history of anesthetic complications  Airway Mallampati: II  TM Distance: >3 FB Neck ROM: Full   Comment: Cervical spine sx Dental  (+) Partial Lower, Dental Advisory Given   Pulmonary sleep apnea , pneumonia (covid - 08/2019), resolved, former smoker,    Pulmonary exam normal breath sounds clear to auscultation       Cardiovascular METS: 3 - Mets hypertension, Pt. on medications + CAD, + Past MI, + Peripheral Vascular Disease (stent) and + DOE  Normal cardiovascular exam Rhythm:Regular Rate:Normal  1. Left ventricular ejection fraction, by estimation, is 60 to 65%. The  left ventricle has normal function. The left ventricle has no regional  wall motion abnormalities. There is mild left ventricular hypertrophy.  Left ventricular diastolic parameters  are consistent with Grade I diastolic dysfunction (impaired relaxation).  2. Right ventricular systolic function is normal. The right ventricular  size is normal. There is normal pulmonary artery systolic pressure.  3. Right atrial size was mildly dilated.  4. The mitral valve is normal in structure. No evidence of mitral valve  regurgitation. No evidence of mitral stenosis.  5. The aortic valve is tricuspid. Aortic valve regurgitation is not  visualized. No aortic stenosis is present.  6. The inferior vena cava is normal in size with greater than 50%  respiratory variability, suggesting right atrial pressure of 3 mmHg.    Neuro/Psych PSYCHIATRIC DISORDERS Depression    GI/Hepatic Neg liver ROS, GERD  Controlled,  Endo/Other  diabetes, Well Controlled, Type 2, Oral Hypoglycemic Agents  Renal/GU negative Renal ROS  negative genitourinary    Musculoskeletal  (+) Arthritis ,   Abdominal   Peds  Hematology  (+) anemia ,   Anesthesia Other Findings   Reproductive/Obstetrics negative OB ROS                             Anesthesia Physical  Anesthesia Plan  ASA: 3  Anesthesia Plan: MAC   Post-op Pain Management:    Induction:   PONV Risk Score and Plan: TIVA  Airway Management Planned: Nasal Cannula and Natural Airway  Additional Equipment:   Intra-op Plan:   Post-operative Plan:   Informed Consent: I have reviewed the patients History and Physical, chart, labs and discussed the procedure including the risks, benefits and alternatives for the proposed anesthesia with the patient or authorized representative who has indicated his/her understanding and acceptance.     Dental advisory given  Plan Discussed with: CRNA and Surgeon  Anesthesia Plan Comments:         Anesthesia Quick Evaluation

## 2021-03-27 NOTE — Discharge Instructions (Signed)
Please discharge patient when stable, will follow up today with Dr. Jacklin Zwick at the [facility] [place] office immediately following discharge.  Leave shield in place until visit.  All paperwork with discharge instructions will be given at the office.  [facility] Address:  [address]  [place], [place] [address]  

## 2021-03-27 NOTE — Transfer of Care (Signed)
Immediate Anesthesia Transfer of Care Note  Patient: Nicole Horn  Procedure(s) Performed: CATARACT EXTRACTION PHACO AND INTRAOCULAR LENS PLACEMENT (IOC) (Right: Eye)  Patient Location: Short Stay  Anesthesia Type:MAC  Level of Consciousness: awake and oriented  Airway & Oxygen Therapy: Patient Spontanous Breathing  Post-op Assessment: Report given to RN and Post -op Vital signs reviewed and stable  Post vital signs: Reviewed and stable  Last Vitals:  Vitals Value Taken Time  BP    Temp    Pulse    Resp    SpO2      Last Pain:  Vitals:   03/27/21 0739  TempSrc: Oral  PainSc: 0-No pain         Complications: No notable events documented.

## 2021-03-30 ENCOUNTER — Encounter (HOSPITAL_COMMUNITY): Payer: Self-pay | Admitting: Ophthalmology

## 2021-03-30 ENCOUNTER — Ambulatory Visit: Payer: Medicare Other | Admitting: Nurse Practitioner

## 2021-04-09 ENCOUNTER — Encounter (HOSPITAL_COMMUNITY): Payer: Medicare Other

## 2021-04-23 ENCOUNTER — Other Ambulatory Visit: Payer: Self-pay | Admitting: Nurse Practitioner

## 2021-04-26 IMAGING — DX DG CHEST 2V
2 series · 2 of 2 positions shown · non-contrast
Comparison: March 10, 2020.

CLINICAL DATA: Fever/cough

EXAM:
CHEST - 2 VIEW

[chest pa]
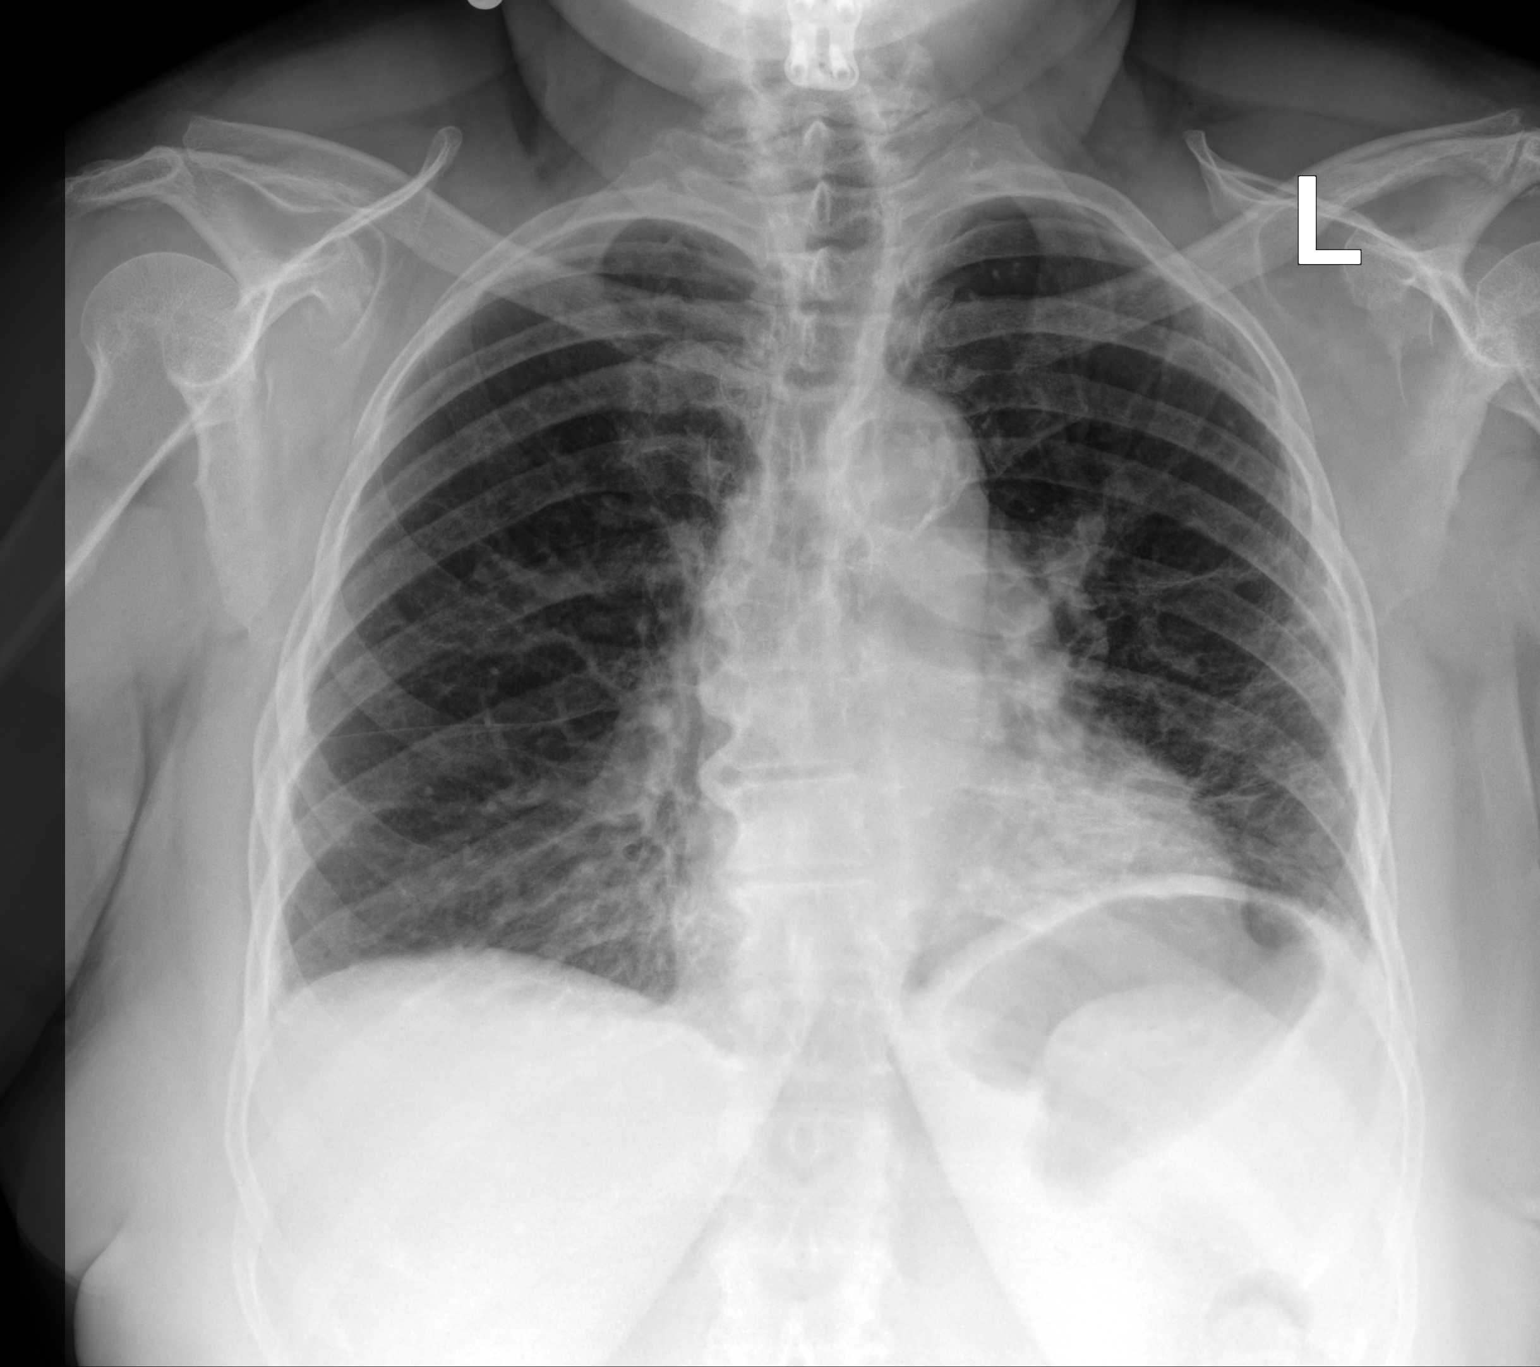

[chest lat]
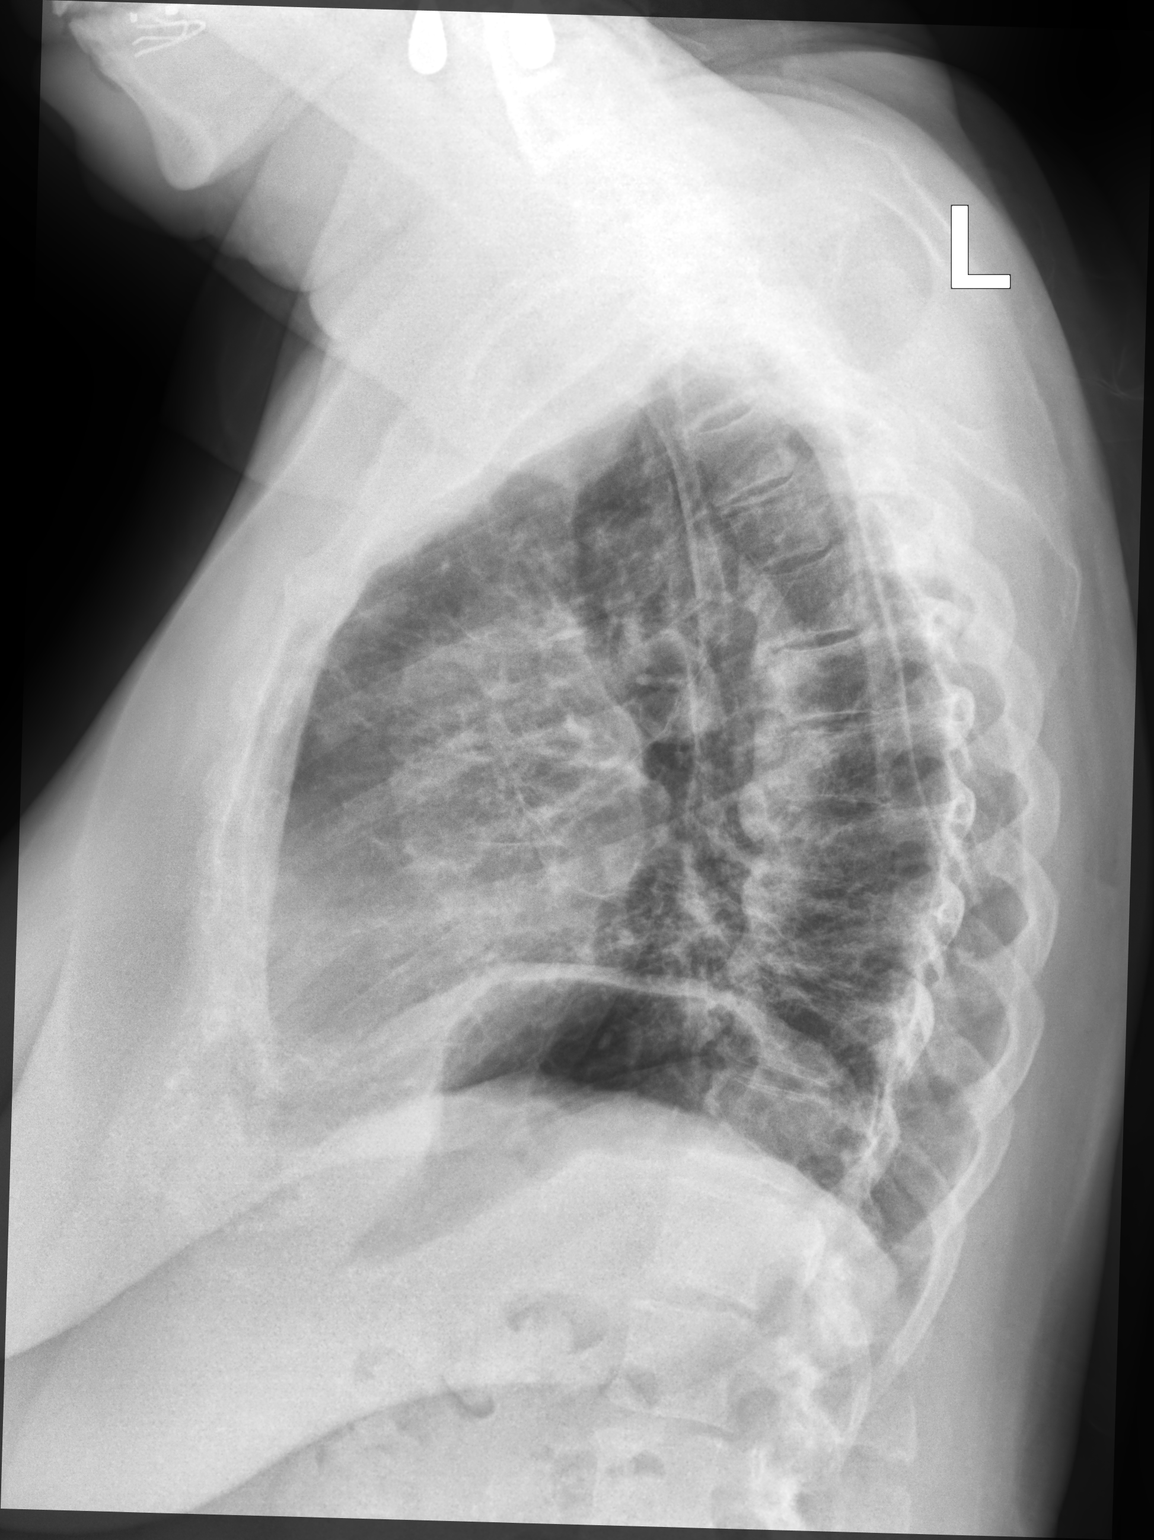

[2 of 2 positions shown; findings below may reference images not displayed]

FINDINGS: The heart size and mediastinal contours are within normal limits.
Calcific atherosclerosis of the aorta. No substantial change in
linear/streaky opacities in the left greater than right lung bases,
most likely representing scarring given stability over multiple
priors. No new consolidation. No visible pleural effusions or
pneumothorax. The visualized skeletal structures are unremarkable.
Partially imaged cervical fusion hardware.
IMPRESSION: No substantial change in linear/streaky opacities in the left
greater than right lung bases, most likely scarring given stability
over multiple priors. No new consolidation.

## 2021-04-30 ENCOUNTER — Other Ambulatory Visit: Payer: Self-pay

## 2021-04-30 ENCOUNTER — Encounter (HOSPITAL_COMMUNITY)
Admission: RE | Admit: 2021-04-30 | Discharge: 2021-04-30 | Disposition: A | Discharge status: Home / Self Care | Payer: Medicare Other | Source: Ambulatory Visit | Attending: Ophthalmology | Admitting: Ophthalmology

## 2021-05-06 ENCOUNTER — Encounter (HOSPITAL_COMMUNITY): Admission: RE | Payer: Self-pay | Source: Home / Self Care

## 2021-05-06 ENCOUNTER — Ambulatory Visit (HOSPITAL_COMMUNITY): Admission: RE | Admit: 2021-05-06 | Payer: Medicare Other | Source: Home / Self Care | Admitting: Ophthalmology

## 2021-05-06 SURGERY — PHACOEMULSIFICATION, CATARACT, WITH IOL INSERTION
Anesthesia: Monitor Anesthesia Care | Laterality: Left

## 2021-05-08 ENCOUNTER — Other Ambulatory Visit: Payer: Self-pay

## 2021-05-08 MED ORDER — OLMESARTAN MEDOXOMIL-HCTZ 40-25 MG PO TABS: 1.0000 | ORAL_TABLET | Freq: Every day | ORAL | 1 refills | Status: DC

## 2021-05-13 DIAGNOSIS — I10 Essential (primary) hypertension: Secondary | ICD-10-CM | POA: Diagnosis not present

## 2021-05-13 DIAGNOSIS — D649 Anemia, unspecified: Secondary | ICD-10-CM | POA: Diagnosis not present

## 2021-05-13 DIAGNOSIS — E785 Hyperlipidemia, unspecified: Secondary | ICD-10-CM | POA: Diagnosis not present

## 2021-05-13 DIAGNOSIS — R945 Abnormal results of liver function studies: Secondary | ICD-10-CM | POA: Diagnosis not present

## 2021-05-14 ENCOUNTER — Other Ambulatory Visit: Payer: Self-pay | Admitting: Nurse Practitioner

## 2021-05-14 DIAGNOSIS — R7401 Elevation of levels of liver transaminase levels: Secondary | ICD-10-CM

## 2021-05-14 DIAGNOSIS — N1831 Chronic kidney disease, stage 3a: Secondary | ICD-10-CM

## 2021-05-14 LAB — CBC WITH DIFFERENTIAL/PLATELET
Basophils Absolute: 0.1 10*3/uL (ref 0.0–0.2)
Basos: 1 %
EOS (ABSOLUTE): 0.3 10*3/uL (ref 0.0–0.4)
Eos: 5 %
Hematocrit: 45.2 % (ref 34.0–46.6)
Hemoglobin: 13.9 g/dL (ref 11.1–15.9)
Immature Grans (Abs): 0 10*3/uL (ref 0.0–0.1)
Immature Granulocytes: 0 %
Lymphocytes Absolute: 2.9 10*3/uL (ref 0.7–3.1)
Lymphs: 43 %
MCH: 26.1 pg — ABNORMAL LOW (ref 26.6–33.0)
MCHC: 30.8 g/dL — ABNORMAL LOW (ref 31.5–35.7)
MCV: 85 fL (ref 79–97)
Monocytes Absolute: 0.5 10*3/uL (ref 0.1–0.9)
Monocytes: 8 %
Neutrophils Absolute: 3 10*3/uL (ref 1.4–7.0)
Neutrophils: 43 %
Platelets: 349 10*3/uL (ref 150–450)
RBC: 5.32 x10E6/uL — ABNORMAL HIGH (ref 3.77–5.28)
RDW: 13.1 % (ref 11.7–15.4)
WBC: 6.7 10*3/uL (ref 3.4–10.8)

## 2021-05-14 LAB — CMP14+EGFR
ALT: 72 IU/L — ABNORMAL HIGH (ref 0–32)
AST: 50 IU/L — ABNORMAL HIGH (ref 0–40)
Albumin/Globulin Ratio: 1.7 (ref 1.2–2.2)
Albumin: 4.5 g/dL (ref 3.7–4.7)
Alkaline Phosphatase: 159 IU/L — ABNORMAL HIGH (ref 44–121)
BUN/Creatinine Ratio: 11 — ABNORMAL LOW (ref 12–28)
BUN: 15 mg/dL (ref 8–27)
Bilirubin Total: 0.5 mg/dL (ref 0.0–1.2)
CO2: 28 mmol/L (ref 20–29)
Calcium: 9.9 mg/dL (ref 8.7–10.3)
Chloride: 104 mmol/L (ref 96–106)
Creatinine, Ser: 1.31 mg/dL — ABNORMAL HIGH (ref 0.57–1.00)
Globulin, Total: 2.6 g/dL (ref 1.5–4.5)
Glucose: 113 mg/dL — ABNORMAL HIGH (ref 65–99)
Potassium: 4 mmol/L (ref 3.5–5.2)
Sodium: 146 mmol/L — ABNORMAL HIGH (ref 134–144)
Total Protein: 7.1 g/dL (ref 6.0–8.5)
eGFR: 43 mL/min/{1.73_m2} — ABNORMAL LOW (ref >59)

## 2021-05-14 LAB — LIPID PANEL WITH LDL/HDL RATIO
Cholesterol, Total: 139 mg/dL (ref 100–199)
HDL: 66 mg/dL (ref >39)
LDL Chol Calc (NIH): 48 mg/dL (ref 0–99)
LDL/HDL Ratio: 0.7 ratio (ref 0.0–3.2)
Triglycerides: 148 mg/dL (ref 0–149)
VLDL Cholesterol Cal: 25 mg/dL (ref 5–40)

## 2021-05-14 NOTE — Progress Notes (Signed)
Her kidney function is worse and liver enzymes are elevated, so I sent in a referral to the kidney specialist and GI specialist to see if we can figure out what is causing this.

## 2021-05-15 ENCOUNTER — Ambulatory Visit: Payer: Medicare Other | Admitting: Nurse Practitioner

## 2021-05-16 LAB — IRON AND TIBC
Iron Saturation: 24 % (ref 15–55)
Iron: 76 ug/dL (ref 27–139)
Total Iron Binding Capacity: 311 ug/dL (ref 250–450)
UIBC: 235 ug/dL (ref 118–369)

## 2021-05-16 LAB — FERRITIN: Ferritin: 196 ng/mL — ABNORMAL HIGH (ref 15–150)

## 2021-05-16 LAB — SPECIMEN STATUS REPORT

## 2021-05-22 ENCOUNTER — Other Ambulatory Visit: Payer: Self-pay

## 2021-05-22 ENCOUNTER — Inpatient Hospital Stay (HOSPITAL_COMMUNITY): Payer: Medicare Other | Attending: Hematology

## 2021-05-22 DIAGNOSIS — D509 Iron deficiency anemia, unspecified: Secondary | ICD-10-CM | POA: Diagnosis not present

## 2021-05-22 LAB — IRON AND TIBC
Iron: 46 ug/dL (ref 28–170)
Saturation Ratios: 13 % (ref 10.4–31.8)
TIBC: 366 ug/dL (ref 250–450)
UIBC: 320 ug/dL

## 2021-05-22 LAB — CBC WITH DIFFERENTIAL/PLATELET
Abs Immature Granulocytes: 0.01 10*3/uL (ref 0.00–0.07)
Basophils Absolute: 0 10*3/uL (ref 0.0–0.1)
Basophils Relative: 1 %
Eosinophils Absolute: 0.2 10*3/uL (ref 0.0–0.5)
Eosinophils Relative: 3 %
HCT: 43.4 % (ref 36.0–46.0)
Hemoglobin: 13.3 g/dL (ref 12.0–15.0)
Immature Granulocytes: 0 %
Lymphocytes Relative: 32 %
Lymphs Abs: 1.6 10*3/uL (ref 0.7–4.0)
MCH: 26.9 pg (ref 26.0–34.0)
MCHC: 30.6 g/dL (ref 30.0–36.0)
MCV: 87.7 fL (ref 80.0–100.0)
Monocytes Absolute: 0.5 10*3/uL (ref 0.1–1.0)
Monocytes Relative: 9 %
Neutro Abs: 2.8 10*3/uL (ref 1.7–7.7)
Neutrophils Relative %: 55 %
Platelets: 252 10*3/uL (ref 150–400)
RBC: 4.95 MIL/uL (ref 3.87–5.11)
RDW: 13.2 % (ref 11.5–15.5)
WBC: 5.1 10*3/uL (ref 4.0–10.5)
nRBC: 0 % (ref 0.0–0.2)

## 2021-05-22 LAB — FERRITIN: Ferritin: 116 ng/mL (ref 11–307)

## 2021-05-25 ENCOUNTER — Ambulatory Visit (HOSPITAL_COMMUNITY): Payer: Medicare Other | Admitting: Physician Assistant

## 2021-05-26 NOTE — Progress Notes (Signed)
Nicole Horn, [place] [id number]   CLINIC:  Medical Oncology/Hematology  PCP:  Nicole Larsson, NP [address]  [address] / [place] [address]  [phone]  REASON FOR VISIT:  Follow-up for microcytic anemia  PRIOR THERAPY: Intermittent Feraheme last on 05/09/2020  CURRENT THERAPY: Iron tablet daily  INTERVAL HISTORY:  Ms. Nicole Horn, a 75 y.o. female, returns for routine follow-up for her microcytic anemia. [name] was last seen on 01/20/2021 by Dr. Delton Horn.  At today's visit, she reports feeling fairly well.  She denies any recent hospitalizations, new diagnoses, or changes in her baseline health status.    She denies any major bleeding episodes such as hematemesis, hematochezia, epistaxis, or melena.  She reports that her energy levels "come and go," but she does feel like she has been more tired and fatigued lately.  She denies any pica cravings.  No chest pain, dizziness, or syncope.  She does report that she is getting more out of breath when climbing stairs than she used to.  She reports that she stopped taking her iron tablets about 3 months ago.  She reports that her energy is 60% with appetite 100%.  She reports that she is maintaining a stable weight at this time.   REVIEW OF SYSTEMS:  Review of Systems  Constitutional:  Positive for fatigue (60%). Negative for appetite change, chills, diaphoresis, fever and unexpected weight change.  HENT:   Negative for lump/mass and nosebleeds.   Eyes:  Negative for eye problems.  Respiratory:  Positive for shortness of breath (w/ exertion). Negative for cough and hemoptysis.   Cardiovascular:  Negative for chest pain, leg swelling and palpitations.  Gastrointestinal:  Negative for abdominal pain, blood in stool, constipation, diarrhea, nausea and vomiting.  Genitourinary:  Positive for frequency. Negative for hematuria.   Musculoskeletal:  Positive for back pain.  Skin:  Negative.   Neurological:  Negative for dizziness, headaches and light-headedness.  Hematological:  Does not bruise/bleed easily.  Psychiatric/Behavioral:  Negative for sleep disturbance. The patient is nervous/anxious.   All other systems reviewed and are negative.   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   Type 2 diabetes mellitus ([place])    Past Surgical History:  Procedure Laterality Date   ABDOMINAL HYSTERECTOMY     BACK SURGERY     BARTHOLIN GLAND CYST EXCISION Left 03/04/2015   Procedure: EXCISION OF LEFT BARTHOLINS TUMOR;  Surgeon: [name], MD;  Location: [facility];  Service: Gynecology;  Laterality: Left;   CATARACT EXTRACTION W/PHACO Right 03/27/2021   Procedure: CATARACT EXTRACTION PHACO AND INTRAOCULAR LENS PLACEMENT (IOC);  Surgeon: Baruch Goldmann, MD;  Location: [facility];  Service: Ophthalmology;  Laterality: Right;  cde 8.45   CERVICAL SPINE SURGERY     COLONOSCOPY N/A 02/01/2020   Procedure: COLONOSCOPY;  Surgeon: Manus Rudd  M, MD; Scattered medium mouth diverticula in the sigmoid and descending colon, otherwise normal exam.   CYST REMOVAL TRUNK     ESOPHAGOGASTRODUODENOSCOPY (EGD) WITH PROPOFOL N/A 03/27/2020   Procedure: ESOPHAGOGASTRODUODENOSCOPY (EGD) WITH PROPOFOL;  Surgeon: Daneil Dolin, MD;  Location: [facility];  Service: Endoscopy;  Laterality: N/A;  1:00pm   GIVENS CAPSULE STUDY N/A 03/27/2020    Procedure: GIVENS CAPSULE STUDY;  Surgeon: Daneil Dolin, MD;  Location: [facility];  Service: Endoscopy;  Laterality: N/A;   Multiple cyst removal surgeries     Stent of lower extremities      SOCIAL HISTORY:  Social History   Socioeconomic History   Marital status: Widowed    Spouse name: Not on file   Number of children: 1   Years of education: Not on file   Highest education level: Some college, no degree  Occupational History   Not on file  Tobacco Use   Smoking status: Former    Packs/day: 2.00    Years: 25.00    Pack years: 50.00    Types: Cigarettes    Quit date: 03/10/1985    Years since quitting: 36.2   Smokeless tobacco: Never  Vaping Use   Vaping Use: Never used  Substance and Sexual Activity   Alcohol use: Not Currently    Alcohol/week: 0.0 standard drinks    Comment: socially   Drug use: No   Sexual activity: Yes    Birth control/protection: Surgical  Other Topics Concern   Not on file  Social History Narrative   Lives in [place] and [place] 6 months here and there owns an apt in [place] alone, but helps to care for mother who is 91 years (goes between the kids)   Sister is in [place] is in [place]      Son who is 79 years old lives in [place], [place]    Has 6 grand kids and 7 great grands      Enjoys exercise (harder since COVID)-dancing      Diet: eats all foods-enjoys smoothies, avoids fried foods, limited red meat if any   Caffeine: coffee 1 cup in the morning-sometimes will have 1 in evening    Water: 3-4 bottles daily       Wears seat belt   Smoke detectors in home   Does not use phone while driving            Social Determinants of Health   Financial Resource Strain: Medium Risk   Difficulty of Paying Living Expenses: Somewhat hard  Food Insecurity: Food Insecurity Present   Worried About Charity fundraiser in the Last Year: Never true   Ran Out of Food in the Last Year: Sometimes true  Transportation  Needs: No Transportation Needs   Lack of Transportation (Medical): No   Lack of Transportation (Non-Medical): No  Physical Activity: Inactive   Days of Exercise per Week: 0 days   Minutes of Exercise per Session: 0 min  Stress: Stress Concern Present   Feeling of Stress : To some extent  Social Connections: Moderately Integrated   Frequency of Communication with Friends and Family: More than three times a week   Frequency of Social Gatherings with Friends and Family: Three times a week   Attends Religious Services: More than 4 times per year   Active Member of Clubs or Organizations: Yes   Attends Archivist Meetings: 1 to 4 times per  year   Marital Status: Widowed  Human resources officer Violence: Not At Risk   Fear of Current or Ex-Partner: No   Emotionally Abused: No   Physically Abused: No   Sexually Abused: No    FAMILY HISTORY:  Family History  Problem Relation Age of Onset   Colon cancer Mother        In her 17s   Breast cancer Sister    Stomach cancer Maternal Aunt    Healthy Brother    Stroke Paternal Grandmother    Heart attack Paternal Grandmother     CURRENT MEDICATIONS:  Current Outpatient Medications  Medication Sig Dispense Refill   albuterol (VENTOLIN HFA) 108 (90 Base) MCG/ACT inhaler Inhale 1-2 puffs into the lungs every 6 (six) hours as needed for wheezing or shortness of breath.     amLODipine (NORVASC) 10 MG tablet Take 1 tablet (10 mg total) by mouth daily. (Patient taking differently: Take 10 mg by mouth in the morning.) 90 tablet 1   aspirin EC 81 MG tablet Take 81 mg by mouth in the morning. Swallow whole.     Calcium Carbonate Antacid (CALCIUM CARBONATE PO) Take 1,200 mg of elemental calcium by mouth in the morning.     clopidogrel (PLAVIX) 75 MG tablet TAKE 1 TABLET BY MOUTH  DAILY (Patient taking differently: Take 75 mg by mouth in the morning.) 90 tablet 3   meloxicam (MOBIC) 7.5 MG tablet Take 7.5 mg by mouth daily as needed for pain.      moxifloxacin (VIGAMOX) 0.5 % ophthalmic solution Place 1 drop into the right eye 2 (two) times daily.     Multiple Vitamin (MULTIVITAMIN WITH MINERALS) TABS tablet Take 1 tablet by mouth in the morning. Centrum     olmesartan-hydrochlorothiazide (BENICAR HCT) 40-25 MG tablet Take 1 tablet by mouth daily. 90 tablet 1   Probiotic Product (DIGESTIVE ADVANTAGE) CAPS Take 1 capsule by mouth daily.     rosuvastatin (CRESTOR) 40 MG tablet Take 1 tablet (40 mg total) by mouth daily. (Patient taking differently: Take 40 mg by mouth in the morning.) 90 tablet 1   valACYclovir (VALTREX) 500 MG tablet Take 500 mg by mouth daily.     vitamin C (ASCORBIC ACID) 500 MG tablet Take 500 mg by mouth 2 (two) times a week.     No current facility-administered medications for this visit.    ALLERGIES:  No Known Allergies  PHYSICAL EXAM:  Performance status (ECOG): 1 - Symptomatic but completely ambulatory  There were no vitals filed for this visit.  Wt Readings from Last 3 Encounters:  03/24/21 167 lb (75.8 kg)  02/06/21 168 lb (76.2 kg)  01/14/21 169 lb (76.7 kg)   Physical Exam Vitals reviewed.  Constitutional:      Appearance: Normal appearance. She is obese.  HENT:     Head: Normocephalic and atraumatic.     Mouth/Throat:     Mouth: Mucous membranes are moist.  Eyes:     Extraocular Movements: Extraocular movements intact.     Pupils: Pupils are equal, round, and reactive to light.  Cardiovascular:     Rate and Rhythm: Normal rate and regular rhythm.     Pulses: Normal pulses.     Heart sounds: Normal heart sounds.  Pulmonary:     Effort: Pulmonary effort is normal.     Breath sounds: Normal breath sounds.  Abdominal:     General: Bowel sounds are normal.     Palpations: Abdomen is soft.  Tenderness: There is no abdominal tenderness.  Musculoskeletal:        General: No swelling.     Right lower leg: Edema (trace) present.     Left lower leg: Edema (trace) present.   Lymphadenopathy:     Cervical: No cervical adenopathy.  Skin:    General: Skin is warm and dry.  Neurological:     General: No focal deficit present.     Mental Status: She is alert and oriented to person, place, and time.  Psychiatric:        Mood and Affect: Mood normal.        Behavior: Behavior normal.    LABORATORY DATA:  I have reviewed the labs as listed.  CBC Latest Ref Rng & Units 05/22/2021 05/13/2021 02/03/2021  WBC 4.0 - 10.5 K/uL 5.1 6.7 4.4  Hemoglobin 12.0 - 15.0 g/dL 13.3 13.9 13.4  Hematocrit 36.0 - 46.0 % 43.4 45.2 41.5  Platelets 150 - 400 K/uL 252 349 272   CMP Latest Ref Rng & Units 05/13/2021 02/03/2021 04/24/2020  Glucose 65 - 99 mg/dL 113(H) 101(H) 81  BUN 8 - 27 mg/dL 15 17 15   Creatinine 0.57 - 1.00 mg/dL 1.31(H) 1.08(H) 1.05(H)  Sodium 134 - 144 mmol/L 146(H) 147(H) 142  Potassium 3.5 - 5.2 mmol/L 4.0 3.9 3.6  Chloride 96 - 106 mmol/L 104 107(H) 107  CO2 20 - 29 mmol/L 28 25 26   Calcium 8.7 - 10.3 mg/dL 9.9 10.2 9.8  Total Protein 6.0 - 8.5 g/dL 7.1 6.9 7.9  Total Bilirubin 0.0 - 1.2 mg/dL 0.5 0.5 0.4  Alkaline Phos 44 - 121 IU/L 159(H) 104 70  AST 0 - 40 IU/L 50(H) 57(H) 28  ALT 0 - 32 IU/L 72(H) 52(H) 26      Component Value Date/Time   RBC 4.95 05/22/2021 1035   MCV 87.7 05/22/2021 1035   MCV 85 05/13/2021 1358   MCH 26.9 05/22/2021 1035   MCHC 30.6 05/22/2021 1035   RDW 13.2 05/22/2021 1035   RDW 13.1 05/13/2021 1358   LYMPHSABS 1.6 05/22/2021 1035   LYMPHSABS 2.9 05/13/2021 1358   MONOABS 0.5 05/22/2021 1035   EOSABS 0.2 05/22/2021 1035   EOSABS 0.3 05/13/2021 1358   BASOSABS 0.0 05/22/2021 1035   BASOSABS 0.1 05/13/2021 1358    DIAGNOSTIC IMAGING:  I have independently reviewed the scans and discussed with the patient. No results found.   ASSESSMENT & PLAN:  1.  Microcytic anemia: - She had a drop in hemoglobin to 6 and MCV to 64.7 on 03/14/2020, status post 2 units PRBC transfusion.  Ferritin was 3 with normal folic acid and  G01. - SPEP was negative - Colonoscopy on 02/01/2020 shows diverticulosis in the sigmoid colon and the descending colon. - EGD on 03/27/2020 shows normal esophagus, normal stomach, 2 small duodenal erosions. - CTAP on 03/15/2020 shows normal liver and normal-sized spleen. - Most recent IV iron with Feraheme x2 on 05/02/2020 and 05/09/2020 - She stopped taking ferrous sulfate 325 mg daily in May 2022 - No major bleeding episodes such as hematemesis, hematochezia, or melena - Most recent labs (05/22/2021): Normal Hgb 13.3, ferritin 116, iron saturation 13% - PLAN: No indication for IV iron at this time.  Recommended to restart oral iron supplementation every other day.  RTC in 6 months for repeat labs and office visit, or sooner if needed based on symptoms.      2.  Family history: - Sister had breast cancer.  Mother had colon  cancer.  Maternal aunt had stomach cancer.   PLAN SUMMARY & DISPOSITION: -Ferrous sulfate every other day - Labs and RTC in 6 months  All questions were answered. The patient knows to call the clinic with any problems, questions or concerns.  Medical decision making: Low  Time spent on visit: I spent 20 minutes counseling the patient face to face. The total time spent in the appointment was 30 minutes and more than 50% was on counseling.   Harriett Rush, PA-C  05/27/2021 11:52 AM

## 2021-05-27 ENCOUNTER — Other Ambulatory Visit: Payer: Self-pay

## 2021-05-27 ENCOUNTER — Inpatient Hospital Stay (HOSPITAL_COMMUNITY): Payer: Medicare Other | Attending: Physician Assistant | Admitting: Physician Assistant

## 2021-05-27 VITALS — BP 164/74 | HR 78 | Temp 96.6°F | Resp 18 | Wt 168.7 lb

## 2021-05-27 DIAGNOSIS — Z803 Family history of malignant neoplasm of breast: Secondary | ICD-10-CM | POA: Insufficient documentation

## 2021-05-27 DIAGNOSIS — Z79899 Other long term (current) drug therapy: Secondary | ICD-10-CM | POA: Diagnosis not present

## 2021-05-27 DIAGNOSIS — I10 Essential (primary) hypertension: Secondary | ICD-10-CM | POA: Diagnosis not present

## 2021-05-27 DIAGNOSIS — D509 Iron deficiency anemia, unspecified: Secondary | ICD-10-CM | POA: Diagnosis not present

## 2021-05-27 DIAGNOSIS — E782 Mixed hyperlipidemia: Secondary | ICD-10-CM | POA: Diagnosis not present

## 2021-05-27 DIAGNOSIS — Z823 Family history of stroke: Secondary | ICD-10-CM | POA: Insufficient documentation

## 2021-05-27 DIAGNOSIS — M549 Dorsalgia, unspecified: Secondary | ICD-10-CM | POA: Diagnosis not present

## 2021-05-27 DIAGNOSIS — R5383 Other fatigue: Secondary | ICD-10-CM | POA: Insufficient documentation

## 2021-05-27 DIAGNOSIS — Z800 Family history of malignant neoplasm of digestive organs: Secondary | ICD-10-CM | POA: Insufficient documentation

## 2021-05-27 DIAGNOSIS — I252 Old myocardial infarction: Secondary | ICD-10-CM | POA: Insufficient documentation

## 2021-05-27 DIAGNOSIS — Z8616 Personal history of COVID-19: Secondary | ICD-10-CM | POA: Diagnosis not present

## 2021-05-27 DIAGNOSIS — Z87891 Personal history of nicotine dependence: Secondary | ICD-10-CM | POA: Insufficient documentation

## 2021-05-27 DIAGNOSIS — R0602 Shortness of breath: Secondary | ICD-10-CM | POA: Insufficient documentation

## 2021-05-27 DIAGNOSIS — Z8249 Family history of ischemic heart disease and other diseases of the circulatory system: Secondary | ICD-10-CM | POA: Insufficient documentation

## 2021-05-27 NOTE — Patient Instructions (Signed)
[place] at [facility] Discharge Instructions  You were seen today by Tarri Abernethy PA-C for your iron deficiency anemia.  You blood and iron counts looked great!    LABS: Return in 6 months for repeat labs   OTHER TESTS: None at this time  MEDICATIONS: Start taking iron over-the-counter (ferrous sulfate 325 mg) supplement every other day.  FOLLOW-UP APPOINTMENT: Office visit in 6 months, after labs   Thank you for choosing [place] at [facility] to provide your oncology and hematology care.  To afford each patient quality time with our provider, please arrive at least 15 minutes before your scheduled appointment time.   If you have a lab appointment with the [facility] please come in thru the Main Entrance and check in at the main information desk.  You need to re-schedule your appointment should you arrive 10 or more minutes late.  We strive to give you quality time with our providers, and arriving late affects you and other patients whose appointments are after yours.  Also, if you no show three or more times for appointments you may be dismissed from the clinic at the providers discretion.     Again, thank you for choosing [facility].  Our hope is that these requests will decrease the amount of time that you wait before being seen by our physicians.       _____________________________________________________________  Should you have questions after your visit to [facility], please contact our office at [phone] and follow the prompts.  Our office hours are 8:00 a.m. and 4:30 p.m. Monday - Friday.  Please note that voicemails left after 4:00 p.m. may not be returned until the following business day.  We are closed weekends and major holidays.  You do have access to a nurse 24-7, just call the main number to the clinic [phone] and do not press any options, hold on the line and a nurse will  answer the phone.    For prescription refill requests, have your pharmacy contact our office and allow 72 hours.    Due to Covid, you will need to wear a mask upon entering the hospital. If you do not have a mask, a mask will be given to you at the Main Entrance upon arrival. For doctor visits, patients may have 1 support person age 60 or older with them. For treatment visits, patients can not have anyone with them due to social distancing guidelines and our immunocompromised population.

## 2021-07-06 ENCOUNTER — Other Ambulatory Visit: Payer: Self-pay | Admitting: Obstetrics & Gynecology

## 2021-07-08 ENCOUNTER — Other Ambulatory Visit (HOSPITAL_COMMUNITY): Payer: Self-pay | Admitting: Nephrology

## 2021-07-08 DIAGNOSIS — E1129 Type 2 diabetes mellitus with other diabetic kidney complication: Secondary | ICD-10-CM | POA: Diagnosis not present

## 2021-07-08 DIAGNOSIS — I129 Hypertensive chronic kidney disease with stage 1 through stage 4 chronic kidney disease, or unspecified chronic kidney disease: Secondary | ICD-10-CM | POA: Diagnosis not present

## 2021-07-08 DIAGNOSIS — E1122 Type 2 diabetes mellitus with diabetic chronic kidney disease: Secondary | ICD-10-CM | POA: Diagnosis not present

## 2021-07-08 DIAGNOSIS — R809 Proteinuria, unspecified: Secondary | ICD-10-CM | POA: Diagnosis not present

## 2021-07-08 DIAGNOSIS — N189 Chronic kidney disease, unspecified: Secondary | ICD-10-CM | POA: Diagnosis not present

## 2021-07-08 DIAGNOSIS — I5032 Chronic diastolic (congestive) heart failure: Secondary | ICD-10-CM | POA: Diagnosis not present

## 2021-07-08 DIAGNOSIS — E870 Hyperosmolality and hypernatremia: Secondary | ICD-10-CM | POA: Diagnosis not present

## 2021-07-09 ENCOUNTER — Other Ambulatory Visit: Payer: Self-pay

## 2021-07-09 ENCOUNTER — Telehealth: Payer: Self-pay | Admitting: Obstetrics & Gynecology

## 2021-07-09 MED ORDER — VALACYCLOVIR HCL 500 MG PO TABS: 500.0000 mg | ORAL_TABLET | Freq: Every day | ORAL | 11 refills | Status: DC

## 2021-07-09 NOTE — Telephone Encounter (Signed)
Patient needs a refill on valacyclovir hcl 500 mg to the [facility] [address].

## 2021-07-13 ENCOUNTER — Ambulatory Visit: Payer: Medicare Other | Admitting: Nurse Practitioner

## 2021-07-14 ENCOUNTER — Ambulatory Visit (HOSPITAL_COMMUNITY)
Admission: RE | Admit: 2021-07-14 | Discharge: 2021-07-14 | Disposition: A | Discharge status: Home / Self Care | Payer: Medicare Other | Source: Ambulatory Visit | Attending: Nephrology | Admitting: Nephrology

## 2021-07-14 ENCOUNTER — Other Ambulatory Visit: Payer: Self-pay

## 2021-07-14 DIAGNOSIS — N189 Chronic kidney disease, unspecified: Secondary | ICD-10-CM | POA: Diagnosis not present

## 2021-07-14 DIAGNOSIS — I5032 Chronic diastolic (congestive) heart failure: Secondary | ICD-10-CM | POA: Insufficient documentation

## 2021-07-14 DIAGNOSIS — E870 Hyperosmolality and hypernatremia: Secondary | ICD-10-CM | POA: Diagnosis not present

## 2021-07-14 DIAGNOSIS — I130 Hypertensive heart and chronic kidney disease with heart failure and stage 1 through stage 4 chronic kidney disease, or unspecified chronic kidney disease: Secondary | ICD-10-CM | POA: Diagnosis not present

## 2021-07-14 DIAGNOSIS — R809 Proteinuria, unspecified: Secondary | ICD-10-CM | POA: Insufficient documentation

## 2021-07-14 DIAGNOSIS — K760 Fatty (change of) liver, not elsewhere classified: Secondary | ICD-10-CM | POA: Diagnosis not present

## 2021-07-14 DIAGNOSIS — E1122 Type 2 diabetes mellitus with diabetic chronic kidney disease: Secondary | ICD-10-CM | POA: Insufficient documentation

## 2021-07-14 DIAGNOSIS — E1129 Type 2 diabetes mellitus with other diabetic kidney complication: Secondary | ICD-10-CM | POA: Diagnosis not present

## 2021-07-23 ENCOUNTER — Ambulatory Visit: Payer: Medicare Other | Admitting: Cardiology

## 2021-07-27 ENCOUNTER — Ambulatory Visit: Payer: Medicare Other | Admitting: Cardiology

## 2021-07-29 ENCOUNTER — Ambulatory Visit: Payer: Medicare Other | Admitting: Cardiology

## 2021-07-31 ENCOUNTER — Other Ambulatory Visit: Payer: Self-pay

## 2021-07-31 ENCOUNTER — Encounter (HOSPITAL_COMMUNITY): Payer: Self-pay | Admitting: *Deleted

## 2021-07-31 DIAGNOSIS — Z87891 Personal history of nicotine dependence: Secondary | ICD-10-CM | POA: Diagnosis not present

## 2021-07-31 DIAGNOSIS — E119 Type 2 diabetes mellitus without complications: Secondary | ICD-10-CM | POA: Diagnosis not present

## 2021-07-31 DIAGNOSIS — Z79899 Other long term (current) drug therapy: Secondary | ICD-10-CM | POA: Insufficient documentation

## 2021-07-31 DIAGNOSIS — Z7982 Long term (current) use of aspirin: Secondary | ICD-10-CM | POA: Diagnosis not present

## 2021-07-31 DIAGNOSIS — Z7901 Long term (current) use of anticoagulants: Secondary | ICD-10-CM | POA: Insufficient documentation

## 2021-07-31 DIAGNOSIS — R197 Diarrhea, unspecified: Secondary | ICD-10-CM | POA: Diagnosis not present

## 2021-07-31 DIAGNOSIS — R531 Weakness: Secondary | ICD-10-CM | POA: Diagnosis present

## 2021-07-31 DIAGNOSIS — J101 Influenza due to other identified influenza virus with other respiratory manifestations: Secondary | ICD-10-CM | POA: Diagnosis not present

## 2021-07-31 DIAGNOSIS — Z8616 Personal history of COVID-19: Secondary | ICD-10-CM | POA: Insufficient documentation

## 2021-07-31 DIAGNOSIS — R509 Fever, unspecified: Secondary | ICD-10-CM | POA: Diagnosis not present

## 2021-07-31 DIAGNOSIS — I2510 Atherosclerotic heart disease of native coronary artery without angina pectoris: Secondary | ICD-10-CM | POA: Diagnosis not present

## 2021-07-31 DIAGNOSIS — Z20822 Contact with and (suspected) exposure to covid-19: Secondary | ICD-10-CM | POA: Diagnosis not present

## 2021-07-31 DIAGNOSIS — U071 COVID-19: Secondary | ICD-10-CM | POA: Diagnosis not present

## 2021-07-31 DIAGNOSIS — I10 Essential (primary) hypertension: Secondary | ICD-10-CM | POA: Insufficient documentation

## 2021-07-31 LAB — RESP PANEL BY RT-PCR (FLU A&B, COVID) ARPGX2
Influenza A by PCR: POSITIVE — AB
Influenza B by PCR: NEGATIVE
SARS Coronavirus 2 by RT PCR: NEGATIVE

## 2021-07-31 NOTE — ED Triage Notes (Signed)
Pt states she has had diarrhea since yesterday and weakness x one week with multiple falls

## 2021-08-01 ENCOUNTER — Emergency Department (HOSPITAL_COMMUNITY)
Admission: EM | Admit: 2021-08-01 | Discharge: 2021-08-01 | Disposition: A | Discharge status: Home / Self Care | Payer: Medicare Other | Attending: Emergency Medicine | Admitting: Emergency Medicine

## 2021-08-01 ENCOUNTER — Emergency Department (HOSPITAL_COMMUNITY): Payer: Medicare Other

## 2021-08-01 DIAGNOSIS — R197 Diarrhea, unspecified: Secondary | ICD-10-CM | POA: Diagnosis not present

## 2021-08-01 DIAGNOSIS — R531 Weakness: Secondary | ICD-10-CM | POA: Diagnosis not present

## 2021-08-01 DIAGNOSIS — R509 Fever, unspecified: Secondary | ICD-10-CM | POA: Diagnosis not present

## 2021-08-01 DIAGNOSIS — J101 Influenza due to other identified influenza virus with other respiratory manifestations: Secondary | ICD-10-CM

## 2021-08-01 LAB — CBC WITH DIFFERENTIAL/PLATELET
Abs Immature Granulocytes: 0.04 10*3/uL (ref 0.00–0.07)
Basophils Absolute: 0 10*3/uL (ref 0.0–0.1)
Basophils Relative: 0 %
Eosinophils Absolute: 0 10*3/uL (ref 0.0–0.5)
Eosinophils Relative: 0 %
HCT: 39.4 % (ref 36.0–46.0)
Hemoglobin: 12.2 g/dL (ref 12.0–15.0)
Immature Granulocytes: 0 %
Lymphocytes Relative: 14 %
Lymphs Abs: 1.3 10*3/uL (ref 0.7–4.0)
MCH: 26.2 pg (ref 26.0–34.0)
MCHC: 31 g/dL (ref 30.0–36.0)
MCV: 84.5 fL (ref 80.0–100.0)
Monocytes Absolute: 0.8 10*3/uL (ref 0.1–1.0)
Monocytes Relative: 9 %
Neutro Abs: 7.1 10*3/uL (ref 1.7–7.7)
Neutrophils Relative %: 77 %
Platelets: 239 10*3/uL (ref 150–400)
RBC: 4.66 MIL/uL (ref 3.87–5.11)
RDW: 15 % (ref 11.5–15.5)
WBC: 9.2 10*3/uL (ref 4.0–10.5)
nRBC: 0 % (ref 0.0–0.2)

## 2021-08-01 LAB — BASIC METABOLIC PANEL
Anion gap: 10 (ref 5–15)
BUN: 23 mg/dL (ref 8–23)
CO2: 24 mmol/L (ref 22–32)
Calcium: 8.3 mg/dL — ABNORMAL LOW (ref 8.9–10.3)
Chloride: 103 mmol/L (ref 98–111)
Creatinine, Ser: 1.53 mg/dL — ABNORMAL HIGH (ref 0.44–1.00)
GFR, Estimated: 35 mL/min — ABNORMAL LOW (ref >60)
Glucose, Bld: 113 mg/dL — ABNORMAL HIGH (ref 70–99)
Potassium: 3.3 mmol/L — ABNORMAL LOW (ref 3.5–5.1)
Sodium: 137 mmol/L (ref 135–145)

## 2021-08-01 MED ORDER — POTASSIUM CHLORIDE CRYS ER 20 MEQ PO TBCR
40.0000 meq | EXTENDED_RELEASE_TABLET | Freq: Once | ORAL | Status: AC
  Administered 2021-08-01: 40 meq via ORAL
  Filled 2021-08-01: qty 2

## 2021-08-01 MED ORDER — LOPERAMIDE HCL 2 MG PO CAPS: 2.0000 mg | ORAL_CAPSULE | Freq: Four times a day (QID) | ORAL | 0 refills | Status: DC | PRN

## 2021-08-01 MED ORDER — SODIUM CHLORIDE 0.9 % IV BOLUS
500.0000 mL | Freq: Once | INTRAVENOUS | Status: AC
  Administered 2021-08-01: 500 mL via INTRAVENOUS

## 2021-08-01 MED ORDER — ACETAMINOPHEN 325 MG PO TABS
650.0000 mg | ORAL_TABLET | Freq: Once | ORAL | Status: AC
  Administered 2021-08-01: 650 mg via ORAL
  Filled 2021-08-01: qty 2

## 2021-08-01 MED ORDER — ONDANSETRON HCL 4 MG PO TABS: 4.0000 mg | ORAL_TABLET | Freq: Four times a day (QID) | ORAL | 0 refills | Status: DC | PRN

## 2021-08-01 NOTE — ED Notes (Signed)
Pt felt weak while standing

## 2021-08-01 NOTE — ED Provider Notes (Signed)
[facility] EMERGENCY DEPARTMENT Provider Note   CSN: [id number] Arrival date & time: 07/31/21  2042     History Chief Complaint  Patient presents with   Weakness    Nicole Horn is a 75 y.o. female.  Patient presents to the emergency department for evaluation of generalized weakness.  Patient reports that she has been sick for 1 week.  Patient reports that today is the first day she felt well enough to get out of bed.  She has been so weak that it has caused falls.      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   Type 2 diabetes mellitus [facility])     Patient Active Problem List   Diagnosis Date Noted   LFTs abnormal 02/06/2021   Herpes 01/02/2021   Palpitations 11/27/2020   Depression, major, single episode, severe ([place]) 10/22/2020   Insomnia 10/22/2020   Caregiver role strain 07/23/2020   Primary osteoarthritis of first carpometacarpal joint of right hand 07/16/2020   Annual visit for general adult medical examination with abnormal findings 04/16/2020   Postmenopausal 04/16/2020   Screening mammogram, encounter for 04/16/2020   Atypical mole 04/16/2020   Need for immunization against influenza 04/16/2020   Constipation 03/17/2020   PVD (peripheral vascular disease) ([place]) 03/14/2020   CAD (coronary artery disease) 03/14/2020   Iron deficiency anemia 03/14/2020   DOE (dyspnea on exertion) 03/10/2020   H/O adenomatous polyp of colon  01/02/2020   FH: colon cancer 01/02/2020   Heart disease 12/11/2019   Upper airway cough syndrome 11/13/2019   Overweight with body mass index (BMI) of 29 to 29.9 in adult 10/16/2019   Essential hypertension 09/10/2019   Hyperlipemia 09/10/2019    Past Surgical History:  Procedure Laterality Date   ABDOMINAL HYSTERECTOMY     BACK SURGERY     BARTHOLIN GLAND CYST EXCISION Left 03/04/2015   Procedure: EXCISION OF LEFT BARTHOLINS TUMOR;  Surgeon: [name], MD;  Location: [facility] ORS;  Service: Gynecology;  Laterality: Left;   CATARACT EXTRACTION W/PHACO Right 03/27/2021   Procedure: CATARACT EXTRACTION PHACO AND INTRAOCULAR LENS PLACEMENT (IOC);  Surgeon: Baruch Goldmann, MD;  Location: [facility] ORS;  Service: Ophthalmology;  Laterality: Right;  cde 8.45   CERVICAL SPINE SURGERY     COLONOSCOPY N/A 02/01/2020   Procedure: COLONOSCOPY;  Surgeon: Daneil Dolin, MD; Scattered medium mouth diverticula in the sigmoid and descending colon, otherwise normal exam.   CYST REMOVAL TRUNK     ESOPHAGOGASTRODUODENOSCOPY (EGD) WITH PROPOFOL N/A 03/27/2020   Procedure: ESOPHAGOGASTRODUODENOSCOPY (EGD) WITH PROPOFOL;  Surgeon: Daneil Dolin, MD;  Location: [facility] ENDO SUITE;  Service: Endoscopy;  Laterality: N/A;  1:00pm   GIVENS CAPSULE STUDY N/A 03/27/2020   Procedure: GIVENS CAPSULE STUDY;  Surgeon: Daneil Dolin, MD;  Location: [facility] ENDO SUITE;  Service: Endoscopy;  Laterality: N/A;   Multiple cyst removal surgeries     Stent  of lower extremities       OB History     Gravida  1   Para  1   Term      Preterm  1   AB      Living  1      SAB      IAB      Ectopic      Multiple      Live Births  1           Family History  Problem Relation Age of Onset   Colon cancer Mother        In her 53s   Breast cancer Sister    Stomach cancer Maternal Aunt    Healthy Brother    Stroke Paternal Grandmother    Heart attack Paternal Grandmother     Social History   Tobacco Use   Smoking  status: Former    Packs/day: 2.00    Years: 25.00    Pack years: 50.00    Types: Cigarettes    Quit date: 03/10/1985    Years since quitting: 36.4   Smokeless tobacco: Never  Vaping Use   Vaping Use: Never used  Substance Use Topics   Alcohol use: Not Currently    Alcohol/week: 0.0 standard drinks    Comment: socially   Drug use: No    Home Medications Prior to Admission medications   Medication Sig Start Date End Date Taking? Authorizing Provider  loperamide (IMODIUM) 2 MG capsule Take 1 capsule (2 mg total) by mouth 4 (four) times daily as needed for diarrhea or loose stools. 08/01/21  Yes Ibrahem Volkman, Gwenyth Allegra, MD  ondansetron (ZOFRAN) 4 MG tablet Take 1 tablet (4 mg total) by mouth every 6 (six) hours as needed for nausea or vomiting. 08/01/21  Yes Aiman Noe, Gwenyth Allegra, MD  albuterol (VENTOLIN HFA) 108 (90 Base) MCG/ACT inhaler Inhale 1-2 puffs into the lungs every 6 (six) hours as needed for wheezing or shortness of breath. Patient not taking: Reported on 05/27/2021    [provider]  amLODipine (NORVASC) 10 MG tablet Take 1 tablet (10 mg total) by mouth daily. Patient taking differently: Take 10 mg by mouth in the morning. 03/16/21   Noreene Larsson, NP  aspirin EC 81 MG tablet Take 81 mg by mouth in the morning. Swallow whole.    [provider]  Calcium Carbonate Antacid (CALCIUM CARBONATE PO) Take 1,200 mg of elemental calcium by mouth in the morning.    [provider]  clopidogrel (PLAVIX) 75 MG tablet TAKE 1 TABLET BY MOUTH  DAILY Patient taking differently: Take 75 mg by mouth in the morning. 11/12/20   Noreene Larsson, NP  meloxicam (MOBIC) 7.5 MG tablet Take 7.5 mg by mouth daily as needed for pain. Patient not taking: Reported on 05/27/2021    [provider]  methylPREDNISolone (MEDROL DOSEPAK) 4 MG TBPK tablet SMARTSIG:Dose Pack By Mouth As Directed 04/30/21   [provider]  moxifloxacin (VIGAMOX) 0.5 % ophthalmic solution  Place 1 drop into the right eye 2 (two) times daily.    [provider]  Multiple Vitamin (MULTIVITAMIN WITH MINERALS) TABS tablet Take 1 tablet by mouth in the morning. Centrum    [provider]  olmesartan-hydrochlorothiazide (BENICAR HCT) 40-25 MG tablet Take 1 tablet by mouth daily. 05/08/21   Noreene Larsson, NP  prednisoLONE acetate (PRED FORTE) 1 % ophthalmic suspension Place 1 drop into the right eye 4 (four) times daily.  04/30/21   [provider]  Probiotic Product (DIGESTIVE ADVANTAGE) CAPS Take 1 capsule by mouth daily.    [provider]  rosuvastatin (CRESTOR) 40 MG tablet Take 1 tablet (40 mg total) by mouth daily. Patient taking differently: Take 40 mg by mouth in the morning. 03/23/21   Noreene Larsson, NP  valACYclovir (VALTREX) 500 MG tablet TAKE 1 TABLET BY MOUTH TWICE A DAY 07/14/21   Florian Buff, MD  valACYclovir (VALTREX) 500 MG tablet Take 1 tablet (500 mg total) by mouth daily. 07/09/21   Florian Buff, MD  vitamin C (ASCORBIC ACID) 500 MG tablet Take 500 mg by mouth 2 (two) times a week.    [provider]    Allergies    Patient has no known allergies.  Review of Systems   Review of Systems  Constitutional:  Positive for chills, fatigue and fever.  All other systems reviewed and are negative.  Physical Exam Updated Vital Signs BP (!) 146/79   Pulse 88   Temp (!) 100.7 F (38.2 C) (Oral)   Resp 18   Ht 5' (1.524 m)   Wt 73.5 kg   SpO2 92%   BMI 31.64 kg/m   Physical Exam Vitals and nursing note reviewed.  Constitutional:      General: She is not in acute distress.    Appearance: Normal appearance. She is well-developed.  HENT:     Head: Normocephalic and atraumatic.     Right Ear: Hearing normal.     Left Ear: Hearing normal.     Nose: Nose normal.  Eyes:     Conjunctiva/sclera: Conjunctivae normal.     Pupils: Pupils are equal, round, and reactive to light.  Cardiovascular:     Rate and Rhythm:  Regular rhythm.     Heart sounds: S1 normal and S2 normal. No murmur heard.   No friction rub. No gallop.  Pulmonary:     Effort: Pulmonary effort is normal. No respiratory distress.     Breath sounds: Normal breath sounds.  Chest:     Chest wall: No tenderness.  Abdominal:     General: Bowel sounds are normal.     Palpations: Abdomen is soft.     Tenderness: There is no abdominal tenderness. There is no guarding or rebound. Negative signs include Murphy's sign and McBurney's sign.     Hernia: No hernia is present.  Musculoskeletal:        General: Normal range of motion.     Cervical back: Normal range of motion and neck supple.  Skin:    General: Skin is warm and dry.     Findings: No rash.  Neurological:     Mental Status: She is alert and oriented to person, place, and time.     GCS: GCS eye subscore is 4. GCS verbal subscore is 5. GCS motor subscore is 6.     Cranial Nerves: No cranial nerve deficit.     Sensory: No sensory deficit.     Coordination: Coordination normal.  Psychiatric:        Speech: Speech normal.        Behavior: Behavior normal.        Thought Content: Thought content normal.    ED Results / Procedures / Treatments   Labs (all labs ordered are listed, but only abnormal results are displayed) Labs Reviewed  RESP PANEL BY RT-PCR (FLU A&B, COVID) ARPGX2 - Abnormal; Notable for the following components:  Result Value   Influenza A by PCR POSITIVE (*)    All other components within normal limits  BASIC METABOLIC PANEL - Abnormal; Notable for the following components:   Potassium 3.3 (*)    Glucose, Bld 113 (*)    Creatinine, Ser 1.53 (*)    Calcium 8.3 (*)    GFR, Estimated 35 (*)    All other components within normal limits  CBC WITH DIFFERENTIAL/PLATELET    EKG None  Radiology DG Chest Port 1 View  Result Date: 08/01/2021 CLINICAL DATA:  Fever, weakness, diarrhea EXAM: PORTABLE CHEST 1 VIEW COMPARISON:  12/15/2020 FINDINGS: Left basilar  opacity, suspicious for pneumonia, less likely atelectasis. Associated trace left pleural effusion is possible. Right lung is clear. No pneumothorax. The heart is normal in size.  Thoracic aortic atherosclerosis. Cervical spine fixation hardware, incompletely visualized. IMPRESSION: Left basilar opacity, suspicious for pneumonia, less likely atelectasis. Possible trace left pleural effusion. Electronically Signed   By: Julian Hy M.D.   On: 08/01/2021 01:41    Procedures Procedures   Medications Ordered in ED Medications  potassium chloride SA (KLOR-CON M) CR tablet 40 mEq (has no administration in time range)  acetaminophen (TYLENOL) tablet 650 mg (650 mg Oral Given 08/01/21 0023)  sodium chloride 0.9 % bolus 500 mL (500 mLs Intravenous New Bag/Given 08/01/21 0116)    ED Course  I have reviewed the triage vital signs and the nursing notes.  Pertinent labs & imaging results that were available during my care of the patient were reviewed by me and considered in my medical decision making (see chart for details).    MDM Rules/Calculators/A&P                           Patient presents to the emergency department for evaluation of generalized weakness.  Patient febrile at arrival.  She has tested positive for influenza which explains her fever.  She has been sick for 1 week, not a candidate for Tamiflu.  Patient has had some diarrhea and generalized weakness with resulting falls.  She was slightly orthostatic here in the department.  Patient treated with IV fluids.  Blood work reassuring.  Chest x-ray unremarkable.  We will continue symptomatic treatment, oral hydration.  Final Clinical Impression(s) / ED Diagnoses Final diagnoses:  Influenza A    Rx / DC Orders ED Discharge Orders          Ordered    loperamide (IMODIUM) 2 MG capsule  4 times daily PRN        08/01/21 0155    ondansetron (ZOFRAN) 4 MG tablet  Every 6 hours PRN        08/01/21 0155             Orpah Greek, MD 08/01/21 0155

## 2021-08-01 NOTE — ED Notes (Signed)
Pt transferred from [facility] to bed by self with standby assist

## 2021-08-07 ENCOUNTER — Ambulatory Visit: Payer: Medicare Other | Admitting: Nurse Practitioner

## 2021-08-10 ENCOUNTER — Ambulatory Visit (INDEPENDENT_AMBULATORY_CARE_PROVIDER_SITE_OTHER): Payer: Medicare Other | Admitting: Nurse Practitioner

## 2021-08-10 ENCOUNTER — Other Ambulatory Visit: Payer: Self-pay

## 2021-08-10 ENCOUNTER — Encounter: Payer: Self-pay | Admitting: Nurse Practitioner

## 2021-08-10 VITALS — BP 190/75 | HR 79 | Ht 60.0 in | Wt 163.0 lb

## 2021-08-10 DIAGNOSIS — E870 Hyperosmolality and hypernatremia: Secondary | ICD-10-CM | POA: Diagnosis not present

## 2021-08-10 DIAGNOSIS — I129 Hypertensive chronic kidney disease with stage 1 through stage 4 chronic kidney disease, or unspecified chronic kidney disease: Secondary | ICD-10-CM | POA: Diagnosis not present

## 2021-08-10 DIAGNOSIS — E1129 Type 2 diabetes mellitus with other diabetic kidney complication: Secondary | ICD-10-CM | POA: Diagnosis not present

## 2021-08-10 DIAGNOSIS — I5032 Chronic diastolic (congestive) heart failure: Secondary | ICD-10-CM | POA: Diagnosis not present

## 2021-08-10 DIAGNOSIS — E1122 Type 2 diabetes mellitus with diabetic chronic kidney disease: Secondary | ICD-10-CM | POA: Diagnosis not present

## 2021-08-10 DIAGNOSIS — N189 Chronic kidney disease, unspecified: Secondary | ICD-10-CM | POA: Diagnosis not present

## 2021-08-10 DIAGNOSIS — Z79899 Other long term (current) drug therapy: Secondary | ICD-10-CM | POA: Diagnosis not present

## 2021-08-10 DIAGNOSIS — I10 Essential (primary) hypertension: Secondary | ICD-10-CM | POA: Diagnosis not present

## 2021-08-10 DIAGNOSIS — D509 Iron deficiency anemia, unspecified: Secondary | ICD-10-CM

## 2021-08-10 DIAGNOSIS — R7989 Other specified abnormal findings of blood chemistry: Secondary | ICD-10-CM

## 2021-08-10 DIAGNOSIS — E785 Hyperlipidemia, unspecified: Secondary | ICD-10-CM

## 2021-08-10 DIAGNOSIS — E559 Vitamin D deficiency, unspecified: Secondary | ICD-10-CM | POA: Diagnosis not present

## 2021-08-10 MED ORDER — SPIRONOLACTONE 25 MG PO TABS: 25.0000 mg | ORAL_TABLET | Freq: Every day | ORAL | 3 refills | Status: DC

## 2021-08-10 NOTE — Assessment & Plan Note (Addendum)
BP Readings from Last 3 Encounters:  08/10/21 (!) 190/75  08/01/21 139/62  05/27/21 (!) 164/74   -she is taking olmesartan-HCTZ and amlodipine -manually BP was 170/60 -Rx. Spironolactone

## 2021-08-10 NOTE — Assessment & Plan Note (Signed)
-  check iron panel

## 2021-08-10 NOTE — Assessment & Plan Note (Signed)
-  check lipid panel  

## 2021-08-10 NOTE — Assessment & Plan Note (Signed)
-  check LFTs today

## 2021-08-10 NOTE — Patient Instructions (Addendum)
Please have labs drawn today ° °I will be moving to [facility] located at [address], [place],  [address] effective Aug 23, 2021. °If you would like to establish care with [facility] please call [phone]. °

## 2021-08-10 NOTE — Addendum Note (Signed)
Addended by: Quentin Angst on: 08/10/2021 11:23 AM   Modules accepted: Orders

## 2021-08-10 NOTE — Progress Notes (Signed)
Acute Office Visit  Subjective:    Patient ID: Nicole Horn, female    DOB: [birth year], 75 y.o.   MRN: [id number]  Chief Complaint  Patient presents with   Follow-up    3 month follow up     HPI Patient is in today for follow-up for HTN, HLD, and elevated LFTs.  She saw nephrology recently for CKD.  Her BP is elevated today, but was at goal when she saw nephrology. She states she was nervous today because she was scared about checking her kidney function.  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   Type 2 diabetes mellitus ([place])     Past Surgical History:  Procedure Laterality Date   ABDOMINAL HYSTERECTOMY     BACK SURGERY     BARTHOLIN GLAND CYST EXCISION Left 03/04/2015   Procedure: EXCISION OF LEFT BARTHOLINS TUMOR;  Surgeon: [name], MD;  Location: [facility] ORS;  Service: Gynecology;  Laterality: Left;   CATARACT EXTRACTION W/PHACO Right 03/27/2021   Procedure: CATARACT EXTRACTION PHACO AND INTRAOCULAR LENS PLACEMENT (IOC);  Surgeon: Baruch Goldmann, MD;  Location: [facility] ORS;  Service: Ophthalmology;  Laterality: Right;  cde 8.45   CERVICAL SPINE SURGERY     COLONOSCOPY N/A 02/01/2020   Procedure: COLONOSCOPY;  Surgeon: Daneil Dolin, MD; Scattered medium mouth diverticula in the sigmoid and descending colon, otherwise normal exam.   CYST REMOVAL TRUNK     ESOPHAGOGASTRODUODENOSCOPY (EGD) WITH PROPOFOL N/A 03/27/2020   Procedure:  ESOPHAGOGASTRODUODENOSCOPY (EGD) WITH PROPOFOL;  Surgeon: Daneil Dolin, MD;  Location: [facility];  Service: Endoscopy;  Laterality: N/A;  1:00pm   GIVENS CAPSULE STUDY N/A 03/27/2020   Procedure: GIVENS CAPSULE STUDY;  Surgeon: Daneil Dolin, MD;  Location: [facility];  Service: Endoscopy;  Laterality: N/A;   Multiple cyst removal surgeries     Stent of lower extremities      Family History  Problem Relation Age of Onset   Colon cancer Mother        In her 27s   Breast cancer Sister    Stomach cancer Maternal Aunt    Healthy Brother    Stroke Paternal Grandmother    Heart attack Paternal Grandmother     Social History   Socioeconomic History   Marital status: Widowed    Spouse name: Not on file   Number of children: 1   Years of education: Not on file   Highest education level: Some college, no degree  Occupational History   Not on file  Tobacco Use   Smoking status: Former    Packs/day: 2.00    Years: 25.00    Pack years: 50.00    Types: Cigarettes    Quit date: 03/10/1985    Years since quitting: 36.4   Smokeless tobacco: Never  Vaping Use   Vaping Use: Never used  Substance and Sexual Activity  Alcohol use: Not Currently    Alcohol/week: 0.0 standard drinks    Comment: socially   Drug use: No   Sexual activity: Yes    Birth control/protection: Surgical  Other Topics Concern   Not on file  Social History Narrative   Lives in [place] and [place] 6 months here and there owns an apt in [place] alone, but helps to care for mother who is 89 years (goes between the kids)   Sister is in [place] is in [place]      Son who is 74 years old lives in [place], [place]    Has 6 grand kids and 7 great grands      Enjoys exercise (harder since COVID)-dancing      Diet: eats all foods-enjoys smoothies, avoids fried foods, limited red meat if any   Caffeine: coffee 1 cup in the morning-sometimes will have 1 in evening    Water: 3-4 bottles  daily       Wears seat belt   Smoke detectors in home   Does not use phone while driving            Social Determinants of Health   Financial Resource Strain: Medium Risk   Difficulty of Paying Living Expenses: Somewhat hard  Food Insecurity: Food Insecurity Present   Worried About Charity fundraiser in the Last Year: Never true   Ran Out of Food in the Last Year: Sometimes true  Transportation Needs: No Transportation Needs   Lack of Transportation (Medical): No   Lack of Transportation (Non-Medical): No  Physical Activity: Inactive   Days of Exercise per Week: 0 days   Minutes of Exercise per Session: 0 min  Stress: Stress Concern Present   Feeling of Stress : To some extent  Social Connections: Moderately Integrated   Frequency of Communication with Friends and Family: More than three times a week   Frequency of Social Gatherings with Friends and Family: Three times a week   Attends Religious Services: More than 4 times per year   Active Member of Clubs or Organizations: Yes   Attends Archivist Meetings: 1 to 4 times per year   Marital Status: Widowed  Human resources officer Violence: Not At Risk   Fear of Current or Ex-Partner: No   Emotionally Abused: No   Physically Abused: No   Sexually Abused: No    Outpatient Medications Prior to Visit  Medication Sig Dispense Refill   albuterol (VENTOLIN HFA) 108 (90 Base) MCG/ACT inhaler Inhale 1-2 puffs into the lungs every 6 (six) hours as needed for wheezing or shortness of breath.     amLODipine (NORVASC) 10 MG tablet Take 1 tablet (10 mg total) by mouth daily. (Patient taking differently: Take 10 mg by mouth in the morning.) 90 tablet 1   aspirin EC 81 MG tablet Take 81 mg by mouth in the morning. Swallow whole.     Calcium Carbonate Antacid (CALCIUM CARBONATE PO) Take 1,200 mg of elemental calcium by mouth in the morning.     clopidogrel (PLAVIX) 75 MG tablet TAKE 1 TABLET BY MOUTH  DAILY (Patient taking differently:  Take 75 mg by mouth in the morning.) 90 tablet 3   loperamide (IMODIUM) 2 MG capsule Take 1 capsule (2 mg total) by mouth 4 (four) times daily as needed for diarrhea or loose stools. 12 capsule 0   moxifloxacin (VIGAMOX) 0.5 % ophthalmic solution Place 1 drop into the right eye  2 (two) times daily.     Multiple Vitamin (MULTIVITAMIN WITH MINERALS) TABS tablet Take 1 tablet by mouth in the morning. Centrum     olmesartan-hydrochlorothiazide (BENICAR HCT) 40-25 MG tablet Take 1 tablet by mouth daily. 90 tablet 1   ondansetron (ZOFRAN) 4 MG tablet Take 1 tablet (4 mg total) by mouth every 6 (six) hours as needed for nausea or vomiting. 20 tablet 0   prednisoLONE acetate (PRED FORTE) 1 % ophthalmic suspension Place 1 drop into the right eye 4 (four) times daily.     Probiotic Product (DIGESTIVE ADVANTAGE) CAPS Take 1 capsule by mouth daily.     rosuvastatin (CRESTOR) 40 MG tablet Take 1 tablet (40 mg total) by mouth daily. (Patient taking differently: Take 40 mg by mouth in the morning.) 90 tablet 1   valACYclovir (VALTREX) 500 MG tablet TAKE 1 TABLET BY MOUTH TWICE A DAY 20 tablet 11   valACYclovir (VALTREX) 500 MG tablet Take 1 tablet (500 mg total) by mouth daily. 30 tablet 11   vitamin C (ASCORBIC ACID) 500 MG tablet Take 500 mg by mouth 2 (two) times a week.     methylPREDNISolone (MEDROL DOSEPAK) 4 MG TBPK tablet SMARTSIG:Dose Pack By Mouth As Directed     meloxicam (MOBIC) 7.5 MG tablet Take 7.5 mg by mouth daily as needed for pain. (Patient not taking: Reported on 05/27/2021)     No facility-administered medications prior to visit.    No Known Allergies  Review of Systems  Constitutional: Negative.   Respiratory: Negative.    Cardiovascular: Negative.   Musculoskeletal: Negative.   Psychiatric/Behavioral: Negative.        Objective:    Physical Exam Constitutional:      Appearance: Normal appearance.  Cardiovascular:     Rate and Rhythm: Normal rate and regular rhythm.      Pulses: Normal pulses.     Heart sounds: Normal heart sounds.  Pulmonary:     Effort: Pulmonary effort is normal.     Breath sounds: Normal breath sounds.  Musculoskeletal:        General: Normal range of motion.  Neurological:     Mental Status: She is alert.  Psychiatric:        Mood and Affect: Mood normal.        Behavior: Behavior normal.        Thought Content: Thought content normal.        Judgment: Judgment normal.    BP (!) 190/75    Pulse 79    Ht 5' (1.524 m)    Wt 163 lb (73.9 kg)    SpO2 91%    BMI 31.83 kg/m  Wt Readings from Last 3 Encounters:  08/10/21 163 lb (73.9 kg)  07/31/21 162 lb (73.5 kg)  05/27/21 168 lb 10.4 oz (76.5 kg)    Health Maintenance Due  Topic Date Due   Pneumonia Vaccine 68+ Years old (1 - PCV) Never done   TETANUS/TDAP  Never done   Zoster Vaccines- Shingrix (1 of 2) Never done   COVID-19 Vaccine (3 - Moderna risk series) 08/05/2021    There are no preventive care reminders to display for this patient.   No results found for: TSH Lab Results  Component Value Date   WBC 9.2 08/01/2021   HGB 12.2 08/01/2021   HCT 39.4 08/01/2021   MCV 84.5 08/01/2021   PLT 239 08/01/2021   Lab Results  Component Value Date   NA 137 08/01/2021  K 3.3 (L) 08/01/2021   CO2 24 08/01/2021   GLUCOSE 113 (H) 08/01/2021   BUN 23 08/01/2021   CREATININE 1.53 (H) 08/01/2021   BILITOT 0.5 05/13/2021   ALKPHOS 159 (H) 05/13/2021   AST 50 (H) 05/13/2021   ALT 72 (H) 05/13/2021   PROT 7.1 05/13/2021   ALBUMIN 4.5 05/13/2021   CALCIUM 8.3 (L) 08/01/2021   ANIONGAP 10 08/01/2021   EGFR 43 (L) 05/13/2021   Lab Results  Component Value Date   CHOL 139 05/13/2021   Lab Results  Component Value Date   HDL 66 05/13/2021   Lab Results  Component Value Date   LDLCALC 48 05/13/2021   Lab Results  Component Value Date   TRIG 148 05/13/2021   Lab Results  Component Value Date   CHOLHDL 2.3 12/12/2019   Lab Results  Component Value Date    HGBA1C 5.7 (H) 12/12/2019       Assessment & Plan:   Problem List Items Addressed This Visit       Cardiovascular and Mediastinum   Essential hypertension - Primary    BP Readings from Last 3 Encounters:  08/10/21 (!) 190/75  08/01/21 139/62  05/27/21 (!) 164/74  -she is taking olmesartan-HCTZ and amlodipine -manually BP was 170/60 -Rx. Spironolactone       Relevant Medications   spironolactone (ALDACTONE) 25 MG tablet   Other Relevant Orders   CBC with Differential/Platelet   CMP14+EGFR   Lipid Panel With LDL/HDL Ratio     Other   Hyperlipemia    -check lipid panel      Relevant Medications   spironolactone (ALDACTONE) 25 MG tablet   Other Relevant Orders   CMP14+EGFR   Lipid Panel With LDL/HDL Ratio   Iron deficiency anemia    -check iron panel      Relevant Orders   Iron, TIBC and Ferritin Panel   LFTs abnormal    -check LFTs today      Relevant Orders   CMP14+EGFR     Meds ordered this encounter  Medications   spironolactone (ALDACTONE) 25 MG tablet    Sig: Take 1 tablet (25 mg total) by mouth daily.    Dispense:  90 tablet    Refill:  [place], NP

## 2021-08-11 ENCOUNTER — Encounter: Payer: Self-pay | Admitting: Cardiology

## 2021-08-11 ENCOUNTER — Ambulatory Visit: Payer: Medicare Other | Admitting: Cardiology

## 2021-08-11 VITALS — BP 158/86 | HR 72 | Ht 60.0 in | Wt 164.6 lb

## 2021-08-11 DIAGNOSIS — I25119 Atherosclerotic heart disease of native coronary artery with unspecified angina pectoris: Secondary | ICD-10-CM

## 2021-08-11 DIAGNOSIS — E782 Mixed hyperlipidemia: Secondary | ICD-10-CM | POA: Diagnosis not present

## 2021-08-11 DIAGNOSIS — I739 Peripheral vascular disease, unspecified: Secondary | ICD-10-CM | POA: Diagnosis not present

## 2021-08-11 LAB — IRON,TIBC AND FERRITIN PANEL
%SAT: 15 % (calc) — ABNORMAL LOW (ref 16–45)
Ferritin: 68 ng/mL (ref 16–288)
Iron: 51 ug/dL (ref 45–160)
TIBC: 343 mcg/dL (calc) (ref 250–450)

## 2021-08-11 LAB — COMPLETE METABOLIC PANEL WITH GFR
AG Ratio: 1.3 (calc) (ref 1.0–2.5)
ALT: 29 U/L (ref 6–29)
AST: 25 U/L (ref 10–35)
Albumin: 4.1 g/dL (ref 3.6–5.1)
Alkaline phosphatase (APISO): 102 U/L (ref 37–153)
BUN/Creatinine Ratio: 13 (calc) (ref 6–22)
BUN: 14 mg/dL (ref 7–25)
CO2: 27 mmol/L (ref 20–32)
Calcium: 9.4 mg/dL (ref 8.6–10.4)
Chloride: 110 mmol/L (ref 98–110)
Creat: 1.1 mg/dL — ABNORMAL HIGH (ref 0.60–1.00)
Globulin: 3.2 g/dL (calc) (ref 1.9–3.7)
Glucose, Bld: 118 mg/dL — ABNORMAL HIGH (ref 65–99)
Potassium: 4.6 mmol/L (ref 3.5–5.3)
Sodium: 147 mmol/L — ABNORMAL HIGH (ref 135–146)
Total Bilirubin: 0.4 mg/dL (ref 0.2–1.2)
Total Protein: 7.3 g/dL (ref 6.1–8.1)
eGFR: 53 mL/min/{1.73_m2} — ABNORMAL LOW (ref >=60)

## 2021-08-11 LAB — CBC WITH DIFFERENTIAL/PLATELET
Absolute Monocytes: 432 cells/uL (ref 200–950)
Basophils Absolute: 41 cells/uL (ref 0–200)
Basophils Relative: 0.9 %
Eosinophils Absolute: 99 cells/uL (ref 15–500)
Eosinophils Relative: 2.2 %
HCT: 41.1 % (ref 35.0–45.0)
Hemoglobin: 12.6 g/dL (ref 11.7–15.5)
Lymphs Abs: 1755 cells/uL (ref 850–3900)
MCH: 24.9 pg — ABNORMAL LOW (ref 27.0–33.0)
MCHC: 30.7 g/dL — ABNORMAL LOW (ref 32.0–36.0)
MCV: 81.1 fL (ref 80.0–100.0)
MPV: 9.8 fL (ref 7.5–12.5)
Monocytes Relative: 9.6 %
Neutro Abs: 2174 cells/uL (ref 1500–7800)
Neutrophils Relative %: 48.3 %
Platelets: 411 10*3/uL — ABNORMAL HIGH (ref 140–400)
RBC: 5.07 10*6/uL (ref 3.80–5.10)
RDW: 13.8 % (ref 11.0–15.0)
Total Lymphocyte: 39 %
WBC: 4.5 10*3/uL (ref 3.8–10.8)

## 2021-08-11 LAB — LIPID PANEL
Cholesterol: 115 mg/dL (ref <200)
HDL: 47 mg/dL — ABNORMAL LOW (ref >=50)
LDL Cholesterol (Calc): 50 mg/dL (calc)
Non-HDL Cholesterol (Calc): 68 mg/dL (calc) (ref <130)
Total CHOL/HDL Ratio: 2.4 (calc) (ref <5.0)
Triglycerides: 99 mg/dL (ref <150)

## 2021-08-11 NOTE — Progress Notes (Signed)
Cardiology Office Note  Date: 08/11/2021   ID: Nicole Horn, DOB 13-Dec-1945, MRN [id number]  PCP:  Noreene Larsson, NP  Cardiologist:  Rozann Lesches, MD Electrophysiologist:  None   Chief Complaint  Patient presents with   Cardiac follow-up    History of Present Illness: Nicole Horn is a 75 y.o. female last seen in May.  She is here for a routine visit.  From a cardiac perspective, she does not report any progressive angina symptoms on current medical regimen.  She was diagnosed with influenza A at ER visit on December 10.  She just recently had a follow-up visit with her PCP yesterday, I reviewed her lab work.  She is doing well on high-dose Crestor, recent LDL 50.  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   Type 2 diabetes mellitus ([place])     Past Surgical History:  Procedure Laterality Date   ABDOMINAL HYSTERECTOMY     BACK SURGERY     BARTHOLIN GLAND CYST EXCISION Left 03/04/2015   Procedure: EXCISION OF LEFT BARTHOLINS TUMOR;  Surgeon: [name], MD;  Location: [facility] OR[facility];  Service: Gynecology;  Laterality: Left;   CATARACT EXTRACTION W/PHACO Right 03/27/2021   Procedure: CATARACT EXTRACTION PHACO AND INTRAOCULAR LENS PLACEMENT (IOC);  Surgeon: Baruch Goldmann, MD;  Location: [facility];  Service: Ophthalmology;  Laterality: Right;  cde 8.45   CERVICAL SPINE SURGERY     COLONOSCOPY N/A 02/01/2020   Procedure: COLONOSCOPY;   Surgeon: Daneil Dolin, MD; Scattered medium mouth diverticula in the sigmoid and descending colon, otherwise normal exam.   CYST REMOVAL TRUNK     ESOPHAGOGASTRODUODENOSCOPY (EGD) WITH PROPOFOL N/A 03/27/2020   Procedure: ESOPHAGOGASTRODUODENOSCOPY (EGD) WITH PROPOFOL;  Surgeon: Daneil Dolin, MD;  Location: [facility];  Service: Endoscopy;  Laterality: N/A;  1:00pm   GIVENS CAPSULE STUDY N/A 03/27/2020   Procedure: GIVENS CAPSULE STUDY;  Surgeon: Daneil Dolin, MD;  Location: [facility];  Service: Endoscopy;  Laterality: N/A;   Multiple cyst removal surgeries     Stent of lower extremities      Current Outpatient Medications  Medication Sig Dispense Refill   albuterol (VENTOLIN HFA) 108 (90 Base) MCG/ACT inhaler Inhale 1-2 puffs into the lungs every 6 (six) hours as needed for wheezing or shortness of breath.     amLODipine (NORVASC) 10 MG tablet Take 1 tablet (10 mg total) by mouth daily. (Patient taking differently: Take 10 mg by mouth in the morning.) 90 tablet 1   aspirin EC 81 MG tablet Take 81 mg by mouth in the morning. Swallow whole.     Calcium Carbonate Antacid (CALCIUM CARBONATE PO) Take 1,200 mg of elemental calcium by mouth in the morning.     clopidogrel (PLAVIX) 75 MG tablet TAKE 1 TABLET BY MOUTH  DAILY (Patient taking differently: Take 75 mg by mouth in the morning.) 90 tablet  3   loperamide (IMODIUM) 2 MG capsule Take 1 capsule (2 mg total) by mouth 4 (four) times daily as needed for diarrhea or loose stools. 12 capsule 0   meloxicam (MOBIC) 7.5 MG tablet Take 7.5 mg by mouth daily as needed for pain.     moxifloxacin (VIGAMOX) 0.5 % ophthalmic solution Place 1 drop into the right eye 2 (two) times daily.     Multiple Vitamin (MULTIVITAMIN WITH MINERALS) TABS tablet Take 1 tablet by mouth in the morning. Centrum     olmesartan-hydrochlorothiazide (BENICAR HCT) 40-25 MG tablet Take 1 tablet by mouth daily. 90 tablet 1   ondansetron (ZOFRAN) 4 MG tablet Take 1 tablet  (4 mg total) by mouth every 6 (six) hours as needed for nausea or vomiting. 20 tablet 0   prednisoLONE acetate (PRED FORTE) 1 % ophthalmic suspension Place 1 drop into the right eye 4 (four) times daily.     Probiotic Product (DIGESTIVE ADVANTAGE) CAPS Take 1 capsule by mouth daily.     rosuvastatin (CRESTOR) 40 MG tablet Take 1 tablet (40 mg total) by mouth daily. (Patient taking differently: Take 40 mg by mouth in the morning.) 90 tablet 1   spironolactone (ALDACTONE) 25 MG tablet Take 1 tablet (25 mg total) by mouth daily. 90 tablet 3   valACYclovir (VALTREX) 500 MG tablet Take 500 mg by mouth as needed.     vitamin C (ASCORBIC ACID) 500 MG tablet Take 500 mg by mouth 2 (two) times a week.     No current facility-administered medications for this visit.   Allergies:  Patient has no known allergies.   ROS: No palpitations or syncope.  Physical Exam: VS:  BP (!) 158/86    Pulse 72    Ht 5' (1.524 m)    Wt 164 lb 9.6 oz (74.7 kg)    SpO2 94%    BMI 32.15 kg/m , BMI Body mass index is 32.15 kg/m.  Wt Readings from Last 3 Encounters:  08/11/21 164 lb 9.6 oz (74.7 kg)  08/10/21 163 lb (73.9 kg)  07/31/21 162 lb (73.5 kg)    General: Patient appears comfortable at rest. HEENT: Conjunctiva and lids normal, no orthopnea or PND. Neck: Supple, no elevated JVP or carotid bruits, no thyromegaly. Lungs: Clear to auscultation, nonlabored breathing at rest. Cardiac: Regular rate and rhythm, no S3, 1/6 systolic murmur. Extremities: No pitting edema.  ECG:  An ECG dated 01/14/2021 was personally reviewed today and demonstrated:  Sinus rhythm with nonspecific T wave changes.  Recent Labwork: 08/10/2021: ALT 29; AST 25; BUN 14; Creat 1.10; Hemoglobin 12.6; Platelets 411; Potassium 4.6; Sodium 147     Component Value Date/Time   CHOL 115 08/10/2021 1218   CHOL 139 05/13/2021 1358   TRIG 99 08/10/2021 1218   HDL 47 (L) 08/10/2021 1218   HDL 66 05/13/2021 1358   CHOLHDL 2.4 08/10/2021 1218    LDLCALC 50 08/10/2021 1218    Other Studies Reviewed Today:  Echocardiogram 03/04/2020:  1. Left ventricular ejection fraction, by estimation, is 60 to 65%. The  left ventricle has normal function. The left ventricle has no regional  wall motion abnormalities. There is mild left ventricular hypertrophy.  Left ventricular diastolic parameters  are consistent with Grade I diastolic dysfunction (impaired relaxation).   2. Right ventricular systolic function is normal. The right ventricular  size is normal. There is normal pulmonary artery systolic pressure.   3. Right atrial size was mildly dilated.   4. The mitral  valve is normal in structure. No evidence of mitral valve  regurgitation. No evidence of mitral stenosis.   5. The aortic valve is tricuspid. Aortic valve regurgitation is not  visualized. No aortic stenosis is present.   6. The inferior vena cava is normal in size with greater than 50%  respiratory variability, suggesting right atrial pressure of 3 mmHg.   Cardiac monitor June 2022: ZIO XT reviewed.  8 days, 11 hours analyzed.  Predominant rhythm is sinus with heart rate ranging from 48 bpm up to 118 bpm and average heart rate 76 bpm.  There were rare PACs including couplets and triplets representing less than 1% total beats.  Rare PVCs were noted representing less than 1% total beats.  There were 2 very brief episodes of SVT, the longest of which was only 5 beats.  No sustained arrhythmias or pauses.  Assessment and Plan:  1.  Symptomatically stable CAD, no progressive angina symptoms on current medical regimen.  LVEF normal at 60 to 65% by last echocardiogram.  Continue aspirin, Plavix, Norvasc, Benicar HCT, Aldactone, and Crestor.  2.  Mixed hyperlipidemia, doing well on high-dose Crestor with recent LDL 50.  3.  History of PAD status post previous stent intervention to both lower extremities in [place].  She remains on dual antiplatelet therapy and statin with evidence of  mild to moderate bilateral SFA disease by last noninvasive assessment.  No progressive angina symptoms.  Medication Adjustments/Labs and Tests Ordered: Current medicines are reviewed at length with the patient today.  Concerns regarding medicines are outlined above.   Tests Ordered: No orders of the defined types were placed in this encounter.   Medication Changes: No orders of the defined types were placed in this encounter.   Disposition:  Follow up  6 months.  Signed, Satira Sark, MD, [facility] 08/11/2021 4:33 PM    [facility] [address], [place], [place] [address] Phone: [phone]; Fax: [phone]

## 2021-08-11 NOTE — Addendum Note (Signed)
Addended by: Laurine Blazer on: 08/11/2021 04:45 PM   Modules accepted: Orders

## 2021-08-11 NOTE — Patient Instructions (Addendum)

## 2021-08-11 NOTE — Progress Notes (Signed)
Kidney function improved a little since the last visit. Iron panel looks ok, and cholesterol looks great. Nice set of labs overall!

## 2021-08-20 ENCOUNTER — Other Ambulatory Visit: Payer: Self-pay

## 2021-08-20 ENCOUNTER — Ambulatory Visit (INDEPENDENT_AMBULATORY_CARE_PROVIDER_SITE_OTHER): Payer: Medicare Other | Admitting: Nurse Practitioner

## 2021-08-20 ENCOUNTER — Encounter: Payer: Self-pay | Admitting: Nurse Practitioner

## 2021-08-20 VITALS — BP 173/77 | HR 77 | Ht 60.0 in | Wt 163.0 lb

## 2021-08-20 DIAGNOSIS — I10 Essential (primary) hypertension: Secondary | ICD-10-CM | POA: Diagnosis not present

## 2021-08-20 MED ORDER — HYDRALAZINE HCL 10 MG PO TABS: 10.0000 mg | ORAL_TABLET | Freq: Three times a day (TID) | ORAL | 1 refills | Status: DC

## 2021-08-20 NOTE — Patient Instructions (Signed)
I will be moving to [facility] located at [address], [place], [place] [address] effective Aug 23, 2021. °If you would like to establish care with [facility] please call [phone]. °

## 2021-08-20 NOTE — Assessment & Plan Note (Addendum)
BP Readings from Last 3 Encounters:  08/20/21 (!) 173/77  08/11/21 (!) 158/86  08/10/21 (!) 190/75   -she is taking max dose olmesartan and amlodipine, HCTZ, and spironolactone -referral to HTN clinic -Rx. hydralazine

## 2021-08-20 NOTE — Progress Notes (Signed)
Acute Office Visit  Subjective:    Patient ID: Nicole Horn, female    DOB: [birth year], 75 y.o.   MRN: [id number]  Chief Complaint  Patient presents with   Follow-up    Follow up hit head Tuesday on display at store still sore    HPI Patient is in today for BP check. At her last OV, she was started on spironolactone for BP 170/60.    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   Type 2 diabetes mellitus ([place])     Past Surgical History:  Procedure Laterality Date   ABDOMINAL HYSTERECTOMY     BACK SURGERY     BARTHOLIN GLAND CYST EXCISION Left 03/04/2015   Procedure: EXCISION OF LEFT BARTHOLINS TUMOR;  Surgeon: [name], MD;  Location: [facility];  Service: Gynecology;  Laterality: Left;   CATARACT EXTRACTION W/PHACO Right 03/27/2021   Procedure: CATARACT EXTRACTION PHACO AND INTRAOCULAR LENS PLACEMENT (IOC);  Surgeon: Baruch Goldmann, MD;  Location: [facility];  Service: Ophthalmology;  Laterality: Right;  cde 8.45   CERVICAL SPINE SURGERY     COLONOSCOPY N/A 02/01/2020   Procedure: COLONOSCOPY;  Surgeon: Daneil Dolin, MD; Scattered medium mouth diverticula in the sigmoid and descending colon, otherwise normal exam.   CYST REMOVAL TRUNK     ESOPHAGOGASTRODUODENOSCOPY (EGD) WITH PROPOFOL N/A 03/27/2020   Procedure: ESOPHAGOGASTRODUODENOSCOPY (EGD) WITH PROPOFOL;  Surgeon: Daneil Dolin, MD;  Location[facility];  Service: Endoscopy;   Laterality: N/A;  1:00pm   GIVENS CAPSULE STUDY N/A 03/27/2020   Procedure: GIVENS CAPSULE STUDY;  Surgeon: Daneil Dolin, MD;  Location[facility];  Service: Endoscopy;  Laterality: N/A;   Multiple cyst removal surgeries     Stent of lower extremities      Family History  Problem Relation Age of Onset   Colon cancer Mother        In her 41s   Breast cancer Sister    Stomach cancer Maternal Aunt    Healthy Brother    Stroke Paternal Grandmother    Heart attack Paternal Grandmother     Social History   Socioeconomic History   Marital status: Widowed    Spouse name: Not on file   Number of children: 1   Years of education: Not on file   Highest education level: Some college, no degree  Occupational History   Not on file  Tobacco Use   Smoking status: Former    Packs/day: 2.00    Years: 25.00    Pack years: 50.00    Types: Cigarettes    Quit date: 03/10/1985    Years since quitting: 36.4   Smokeless tobacco: Never  Vaping Use   Vaping Use: Never used  Substance and Sexual Activity   Alcohol use: Not Currently    Alcohol/week: 0.0 standard drinks    Comment: socially   Drug use:  No   Sexual activity: Yes    Birth control/protection: Surgical  Other Topics Concern   Not on file  Social History Narrative   Lives in [place] and [place] 6 months here and there owns an apt in [place] alone, but helps to care for mother who is 21 years (goes between the kids)   Sister is in [place] is in [place]      Son who is 24 years old lives in [place], [place]    Has 6 grand kids and 7 great grands      Enjoys exercise (harder since COVID)-dancing      Diet: eats all foods-enjoys smoothies, avoids fried foods, limited red meat if any   Caffeine: coffee 1 cup in the morning-sometimes will have 1 in evening    Water: 3-4 bottles daily       Wears seat belt   Smoke detectors in home   Does not use phone while driving            Social Determinants  of Health   Financial Resource Strain: Medium Risk   Difficulty of Paying Living Expenses: Somewhat hard  Food Insecurity: Food Insecurity Present   Worried About Charity fundraiser in the Last Year: Never true   Ran Out of Food in the Last Year: Sometimes true  Transportation Needs: No Transportation Needs   Lack of Transportation (Medical): No   Lack of Transportation (Non-Medical): No  Physical Activity: Inactive   Days of Exercise per Week: 0 days   Minutes of Exercise per Session: 0 min  Stress: Stress Concern Present   Feeling of Stress : To some extent  Social Connections: Moderately Integrated   Frequency of Communication with Friends and Family: More than three times a week   Frequency of Social Gatherings with Friends and Family: Three times a week   Attends Religious Services: More than 4 times per year   Active Member of Clubs or Organizations: Yes   Attends Archivist Meetings: 1 to 4 times per year   Marital Status: Widowed  Human resources officer Violence: Not At Risk   Fear of Current or Ex-Partner: No   Emotionally Abused: No   Physically Abused: No   Sexually Abused: No    Outpatient Medications Prior to Visit  Medication Sig Dispense Refill   albuterol (VENTOLIN HFA) 108 (90 Base) MCG/ACT inhaler Inhale 1-2 puffs into the lungs every 6 (six) hours as needed for wheezing or shortness of breath.     amLODipine (NORVASC) 10 MG tablet Take 1 tablet (10 mg total) by mouth daily. (Patient taking differently: Take 10 mg by mouth in the morning.) 90 tablet 1   aspirin EC 81 MG tablet Take 81 mg by mouth in the morning. Swallow whole.     Calcium Carbonate Antacid (CALCIUM CARBONATE PO) Take 1,200 mg of elemental calcium by mouth in the morning.     clopidogrel (PLAVIX) 75 MG tablet TAKE 1 TABLET BY MOUTH  DAILY (Patient taking differently: Take 75 mg by mouth in the morning.) 90 tablet 3   loperamide (IMODIUM) 2 MG capsule Take 1 capsule (2 mg total) by mouth 4  (four) times daily as needed for diarrhea or loose stools. 12 capsule 0   meloxicam (MOBIC) 7.5 MG tablet Take 7.5 mg by mouth daily as needed for pain.     moxifloxacin (VIGAMOX) 0.5 % ophthalmic solution Place 1 drop into the right eye 2 (  two) times daily.     Multiple Vitamin (MULTIVITAMIN WITH MINERALS) TABS tablet Take 1 tablet by mouth in the morning. Centrum     olmesartan-hydrochlorothiazide (BENICAR HCT) 40-25 MG tablet Take 1 tablet by mouth daily. 90 tablet 1   ondansetron (ZOFRAN) 4 MG tablet Take 1 tablet (4 mg total) by mouth every 6 (six) hours as needed for nausea or vomiting. 20 tablet 0   prednisoLONE acetate (PRED FORTE) 1 % ophthalmic suspension Place 1 drop into the right eye 4 (four) times daily.     Probiotic Product (DIGESTIVE ADVANTAGE) CAPS Take 1 capsule by mouth daily.     rosuvastatin (CRESTOR) 40 MG tablet Take 1 tablet (40 mg total) by mouth daily. (Patient taking differently: Take 40 mg by mouth in the morning.) 90 tablet 1   spironolactone (ALDACTONE) 25 MG tablet Take 1 tablet (25 mg total) by mouth daily. 90 tablet 3   valACYclovir (VALTREX) 500 MG tablet Take 500 mg by mouth as needed.     vitamin C (ASCORBIC ACID) 500 MG tablet Take 500 mg by mouth 2 (two) times a week.     No facility-administered medications prior to visit.    No Known Allergies  Review of Systems  Constitutional: Negative.   Respiratory: Negative.    Cardiovascular: Negative.   Psychiatric/Behavioral: Negative.        Objective:    Physical Exam Constitutional:      Appearance: Normal appearance.  Cardiovascular:     Rate and Rhythm: Normal rate and regular rhythm.     Pulses: Normal pulses.     Heart sounds: Normal heart sounds.  Pulmonary:     Effort: Pulmonary effort is normal.     Breath sounds: Normal breath sounds.  Neurological:     Mental Status: She is alert.  Psychiatric:        Mood and Affect: Mood normal.        Behavior: Behavior normal.        Thought  Content: Thought content normal.        Judgment: Judgment normal.    BP (!) 173/77    Pulse 77    Ht 5' (1.524 m)    Wt 163 lb 0.6 oz (74 kg)    SpO2 95%    BMI 31.84 kg/m  Wt Readings from Last 3 Encounters:  08/20/21 163 lb 0.6 oz (74 kg)  08/11/21 164 lb 9.6 oz (74.7 kg)  08/10/21 163 lb (73.9 kg)    Health Maintenance Due  Topic Date Due   Pneumonia Vaccine 58+ Years old (1 - PCV) Never done   TETANUS/TDAP  Never done   Zoster Vaccines- Shingrix (1 of 2) Never done   COVID-19 Vaccine (3 - Moderna risk series) 08/05/2021    There are no preventive care reminders to display for this patient.   No results found for: TSH Lab Results  Component Value Date   WBC 4.5 08/10/2021   HGB 12.6 08/10/2021   HCT 41.1 08/10/2021   MCV 81.1 08/10/2021   PLT 411 (H) 08/10/2021   Lab Results  Component Value Date   NA 147 (H) 08/10/2021   K 4.6 08/10/2021   CO2 27 08/10/2021   GLUCOSE 118 (H) 08/10/2021   BUN 14 08/10/2021   CREATININE 1.10 (H) 08/10/2021   BILITOT 0.4 08/10/2021   ALKPHOS 159 (H) 05/13/2021   AST 25 08/10/2021   ALT 29 08/10/2021   PROT 7.3 08/10/2021   ALBUMIN 4.5 05/13/2021  CALCIUM 9.4 08/10/2021   ANIONGAP 10 08/01/2021   EGFR 53 (L) 08/10/2021   Lab Results  Component Value Date   CHOL 115 08/10/2021   Lab Results  Component Value Date   HDL 47 (L) 08/10/2021   Lab Results  Component Value Date   LDLCALC 50 08/10/2021   Lab Results  Component Value Date   TRIG 99 08/10/2021   Lab Results  Component Value Date   CHOLHDL 2.4 08/10/2021   Lab Results  Component Value Date   HGBA1C 5.7 (H) 12/12/2019       Assessment & Plan:   Problem List Items Addressed This Visit       Cardiovascular and Mediastinum   Essential hypertension    BP Readings from Last 3 Encounters:  08/20/21 (!) 173/77  08/11/21 (!) 158/86  08/10/21 (!) 190/75  -she is taking max dose olmesartan and amlodipine, HCTZ, and spironolactone -referral to HTN  clinic -Rx. hydralazine      Relevant Medications   hydrALAZINE (APRESOLINE) 10 MG tablet   Other Visit Diagnoses     Hypertension not at goal    -  Primary   Relevant Medications   hydrALAZINE (APRESOLINE) 10 MG tablet   Other Relevant Orders   Basic metabolic panel   AMB REFERRAL TO [facility]VANCED HTN CLINIC        Meds ordered this encounter  Medications   hydrALAZINE (APRESOLINE) 10 MG tablet    Sig: Take 1 tablet (10 mg total) by mouth 3 (three) times daily.    Dispense:  270 each    Refill:  [place], NP

## 2021-08-21 ENCOUNTER — Emergency Department (HOSPITAL_COMMUNITY): Payer: Medicare Other

## 2021-08-21 ENCOUNTER — Ambulatory Visit: Payer: Medicare Other | Admitting: Nurse Practitioner

## 2021-08-21 ENCOUNTER — Ambulatory Visit: Admission: EM | Admit: 2021-08-21 | Discharge: 2021-08-21 | Payer: Medicare Other

## 2021-08-21 ENCOUNTER — Emergency Department (HOSPITAL_COMMUNITY)
Admission: EM | Admit: 2021-08-21 | Discharge: 2021-08-21 | Disposition: A | Discharge status: Home / Self Care | Payer: Medicare Other | Attending: Emergency Medicine | Admitting: Emergency Medicine

## 2021-08-21 ENCOUNTER — Encounter (HOSPITAL_COMMUNITY): Payer: Self-pay

## 2021-08-21 ENCOUNTER — Other Ambulatory Visit: Payer: Self-pay

## 2021-08-21 DIAGNOSIS — I10 Essential (primary) hypertension: Secondary | ICD-10-CM | POA: Insufficient documentation

## 2021-08-21 DIAGNOSIS — S0990XA Unspecified injury of head, initial encounter: Secondary | ICD-10-CM | POA: Diagnosis not present

## 2021-08-21 DIAGNOSIS — Z87891 Personal history of nicotine dependence: Secondary | ICD-10-CM | POA: Diagnosis not present

## 2021-08-21 DIAGNOSIS — E119 Type 2 diabetes mellitus without complications: Secondary | ICD-10-CM | POA: Diagnosis not present

## 2021-08-21 DIAGNOSIS — W228XXA Striking against or struck by other objects, initial encounter: Secondary | ICD-10-CM | POA: Diagnosis not present

## 2021-08-21 DIAGNOSIS — I2510 Atherosclerotic heart disease of native coronary artery without angina pectoris: Secondary | ICD-10-CM | POA: Insufficient documentation

## 2021-08-21 DIAGNOSIS — Z79899 Other long term (current) drug therapy: Secondary | ICD-10-CM | POA: Diagnosis not present

## 2021-08-21 DIAGNOSIS — Z7982 Long term (current) use of aspirin: Secondary | ICD-10-CM | POA: Diagnosis not present

## 2021-08-21 DIAGNOSIS — R519 Headache, unspecified: Secondary | ICD-10-CM | POA: Diagnosis not present

## 2021-08-21 LAB — BASIC METABOLIC PANEL
BUN/Creatinine Ratio: 14 (ref 12–28)
BUN: 13 mg/dL (ref 8–27)
CO2: 23 mmol/L (ref 20–29)
Calcium: 9.7 mg/dL (ref 8.7–10.3)
Chloride: 109 mmol/L — ABNORMAL HIGH (ref 96–106)
Creatinine, Ser: 0.94 mg/dL (ref 0.57–1.00)
Glucose: 154 mg/dL — ABNORMAL HIGH (ref 70–99)
Potassium: 4.1 mmol/L (ref 3.5–5.2)
Sodium: 144 mmol/L (ref 134–144)
eGFR: 64 mL/min/{1.73_m2} (ref >59)

## 2021-08-21 NOTE — Progress Notes (Signed)
Potassium is great.

## 2021-08-21 NOTE — ED Triage Notes (Signed)
Pt presents to ED after hitting her head on Tuesday, pt hit her head on a shelf, pt denies LOC. Pt reports dizziness. Tylenol not helping with pain. Pt takes ASA daily.

## 2021-08-21 NOTE — ED Provider Notes (Signed)
[place] Provider Note   CSN: [id number] Arrival date & time: 08/21/21  1258     History Chief Complaint  Patient presents with   Head Injury    Nicole Horn is a 75 y.o. female.  HPI  Patient with medical history including hypertension, PAD, type 2 diabetes presents with chief complaint of head trauma.  Patient states  on Tuesday she hit her head after she sit up.  She states that she hit the left side of her forehead, she denies losing conscious, is not on anticoagulant states after that she has been having a mild headache, and the area is tender to the touch, states it was initially swollen but the swelling has slightly gone down.  She states that she feels slightly off but denies photosensitivity, increased sensitive to noise, nausea, vomiting, dizziness or feeling off balance.  She states that she has been taking Tylenol with some relief.  She denies change in vision, paresthesia or weakness upper lower extremities.  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   Type 2 diabetes mellitus [facility])     Patient Active Problem List   Diagnosis Date Noted   LFTs abnormal 02/06/2021   Herpes 01/02/2021   Palpitations 11/27/2020   Depression, major, single episode, severe ([place]) 10/22/2020   Insomnia 10/22/2020   Caregiver role strain 07/23/2020   Primary osteoarthritis of first carpometacarpal joint of right hand 07/16/2020    Annual visit for general adult medical examination with abnormal findings 04/16/2020   Postmenopausal 04/16/2020   Screening mammogram, encounter for 04/16/2020   Atypical mole 04/16/2020   Need for immunization against influenza 04/16/2020   Constipation 03/17/2020   PVD (peripheral vascular disease) ([place]) 03/14/2020   CAD (coronary artery disease) 03/14/2020   Iron deficiency anemia 03/14/2020   DOE (dyspnea on exertion) 03/10/2020   H/O adenomatous polyp of colon 01/02/2020   FH: colon cancer 01/02/2020   Heart disease 12/11/2019   Upper airway cough syndrome 11/13/2019   Overweight with body mass index (BMI) of 29 to 29.9 in adult 10/16/2019   Essential hypertension 09/10/2019   Hyperlipemia 09/10/2019    Past Surgical History:  Procedure Laterality Date   ABDOMINAL HYSTERECTOMY     BACK SURGERY     BARTHOLIN GLAND CYST EXCISION Left 03/04/2015   Procedure: EXCISION OF LEFT BARTHOLINS TUMOR;  Surgeon: [name], MD;  Location: [facility] OR[facility];  Service: Gynecology;  Laterality: Left;   CATARACT EXTRACTION W/PHACO Right 03/27/2021   Procedure: CATARACT EXTRACTION PHACO AND INTRAOCULAR LENS PLACEMENT (IOC);  Surgeon: Baruch Goldmann, MD;  Location: [facility] OR[facility];  Service: Ophthalmology;  Laterality: Right;  cde 8.45   CERVICAL SPINE SURGERY     COLONOSCOPY N/A 02/01/2020   Procedure: COLONOSCOPY;  Surgeon: Daneil Dolin, MD; Scattered medium mouth diverticula in the sigmoid and descending colon, otherwise normal exam.   CYST REMOVAL TRUNK  ESOPHAGOGASTRODUODENOSCOPY (EGD) WITH PROPOFOL N/A 03/27/2020   Procedure: ESOPHAGOGASTRODUODENOSCOPY (EGD) WITH PROPOFOL;  Surgeon: Daneil Dolin, MD;  Location: [facility];  Service: Endoscopy;  Laterality: N/A;  1:00pm   GIVENS CAPSULE STUDY N/A 03/27/2020   Procedure: GIVENS CAPSULE STUDY;  Surgeon: Daneil Dolin, MD;  Location: [facility];  Service: Endoscopy;  Laterality: N/A;   Multiple cyst removal surgeries     Stent of lower  extremities       OB History     Gravida  1   Para  1   Term      Preterm  1   AB      Living  1      SAB      IAB      Ectopic      Multiple      Live Births  1           Family History  Problem Relation Age of Onset   Colon cancer Mother        In her 9s   Breast cancer Sister    Stomach cancer Maternal Aunt    Healthy Brother    Stroke Paternal Grandmother    Heart attack Paternal Grandmother     Social History   Tobacco Use   Smoking status: Former    Packs/day: 2.00    Years: 25.00    Pack years: 50.00    Types: Cigarettes    Quit date: 03/10/1985    Years since quitting: 36.4   Smokeless tobacco: Never  Vaping Use   Vaping Use: Never used  Substance Use Topics   Alcohol use: Not Currently    Alcohol/week: 0.0 standard drinks    Comment: socially   Drug use: No    Home Medications Prior to Admission medications   Medication Sig Start Date End Date Taking? Authorizing Provider  albuterol (VENTOLIN HFA) 108 (90 Base) MCG/ACT inhaler Inhale 1-2 puffs into the lungs every 6 (six) hours as needed for wheezing or shortness of breath.    [provider]  amLODipine (NORVASC) 10 MG tablet Take 1 tablet (10 mg total) by mouth daily. Patient taking differently: Take 10 mg by mouth in the morning. 03/16/21   Noreene Larsson, NP  aspirin EC 81 MG tablet Take 81 mg by mouth in the morning. Swallow whole.    [provider]  Calcium Carbonate Antacid (CALCIUM CARBONATE PO) Take 1,200 mg of elemental calcium by mouth in the morning.    [provider]  clopidogrel (PLAVIX) 75 MG tablet TAKE 1 TABLET BY MOUTH  DAILY Patient taking differently: Take 75 mg by mouth in the morning. 11/12/20   Noreene Larsson, NP  hydrALAZINE (APRESOLINE) 10 MG tablet Take 1 tablet (10 mg total) by mouth 3 (three) times daily. 08/20/21   Noreene Larsson, NP  loperamide (IMODIUM) 2 MG capsule Take 1 capsule (2 mg total) by mouth 4 (four) times daily as  needed for diarrhea or loose stools. 08/01/21   Orpah Greek, MD  meloxicam (MOBIC) 7.5 MG tablet Take 7.5 mg by mouth daily as needed for pain.    [provider]  moxifloxacin (VIGAMOX) 0.5 % ophthalmic solution Place 1 drop into the right eye 2 (two) times daily.    [provider]  Multiple Vitamin (MULTIVITAMIN WITH MINERALS) TABS tablet Take 1 tablet by mouth in the morning. Centrum    [provider]  olmesartan-hydrochlorothiazide (BENICAR HCT) [phone]  MG tablet Take 1 tablet by mouth daily. 05/08/21   Noreene Larsson, NP  ondansetron (ZOFRAN) 4 MG tablet Take 1 tablet (4 mg total) by mouth every 6 (six) hours as needed for nausea or vomiting. 08/01/21   Pollina, Gwenyth Allegra, MD  prednisoLONE acetate (PRED FORTE) 1 % ophthalmic suspension Place 1 drop into the right eye 4 (four) times daily. 04/30/21   [provider]  Probiotic Product (DIGESTIVE ADVANTAGE) CAPS Take 1 capsule by mouth daily.    [provider]  rosuvastatin (CRESTOR) 40 MG tablet Take 1 tablet (40 mg total) by mouth daily. Patient taking differently: Take 40 mg by mouth in the morning. 03/23/21   Noreene Larsson, NP  spironolactone (ALDACTONE) 25 MG tablet Take 1 tablet (25 mg total) by mouth daily. 08/10/21   Noreene Larsson, NP  valACYclovir (VALTREX) 500 MG tablet Take 500 mg by mouth as needed.    [provider]  vitamin C (ASCORBIC ACID) 500 MG tablet Take 500 mg by mouth 2 (two) times a week.    [provider]    Allergies    Patient has no known allergies.  Review of Systems   Review of Systems  Constitutional:  Negative for chills and fever.  HENT:  Negative for congestion.   Respiratory:  Negative for shortness of breath.   Cardiovascular:  Negative for chest pain.  Gastrointestinal:  Negative for abdominal pain.  Genitourinary:  Negative for enuresis.  Musculoskeletal:  Negative for back pain.  Skin:  Negative for rash.  Neurological:   Positive for headaches. Negative for dizziness.  Hematological:  Does not bruise/bleed easily.   Physical Exam Updated Vital Signs BP (!) 176/80 (BP Location: Right Arm)    Pulse 75    Temp 97.9 F (36.6 C) (Oral)    Resp 12    Ht 5' (1.524 m)    Wt 72.6 kg    SpO2 98%    BMI 31.25 kg/m   Physical Exam Vitals and nursing note reviewed.  Constitutional:      General: She is not in acute distress.    Appearance: She is not ill-appearing.  HENT:     Head: Normocephalic and atraumatic.     Comments: No gross deformity to head present, no raccoon eyes or battle sign noted, slightly tender to palpation on the left upper aspect of the forehead.    Nose: No congestion.  Eyes:     Extraocular Movements: Extraocular movements intact.     Conjunctiva/sclera: Conjunctivae normal.     Pupils: Pupils are equal, round, and reactive to light.  Cardiovascular:     Rate and Rhythm: Normal rate and regular rhythm.     Pulses: Normal pulses.     Heart sounds: No murmur heard.   No friction rub. No gallop.  Pulmonary:     Effort: No respiratory distress.     Breath sounds: No wheezing, rhonchi or rales.  Abdominal:     Palpations: Abdomen is soft.     Tenderness: There is no abdominal tenderness. There is no right CVA tenderness or left CVA tenderness.  Musculoskeletal:        General: Normal range of motion.  Skin:    General: Skin is warm and dry.  Neurological:     Mental Status: She is alert.     GCS: GCS eye subscore is 4. GCS verbal subscore is 5. GCS motor subscore is 6.     Cranial Nerves: No cranial  nerve deficit.     Sensory: Sensation is intact.     Motor: No weakness.     Coordination: Romberg sign negative. Finger-Nose-Finger Test normal.     Comments: Cranial nerves II through XII grossly intact, no difficulty with word finding, no slurring of words, able to follow two-step commands, no unilateral weakness present.  Psychiatric:        Mood and Affect: Mood normal.    ED  Results / Procedures / Treatments   Labs (all labs ordered are listed, but only abnormal results are displayed) Labs Reviewed - No data to display  EKG None  Radiology CT Head Wo Contrast  Result Date: 08/21/2021 CLINICAL DATA:  Headache. EXAM: CT HEAD WITHOUT CONTRAST TECHNIQUE: Contiguous axial images were obtained from the base of the skull through the vertex without intravenous contrast. COMPARISON:  None. FINDINGS: Brain: No evidence of acute infarction, hemorrhage, hydrocephalus, extra-axial collection or mass lesion/mass effect. Vascular: No hyperdense vessel or unexpected calcification. Skull: Normal. Negative for fracture or focal lesion. Sinuses/Orbits: No acute finding. Other: None. IMPRESSION: No acute intracranial abnormality seen. Electronically Signed   By: Marijo Conception M.D.   On: 08/21/2021 15:14    Procedures Procedures   Medications Ordered in ED Medications - No data to display  ED Course  I have reviewed the triage vital signs and the nursing notes.  Pertinent labs & imaging results that were available during my care of the patient were reviewed by me and considered in my medical decision making (see chart for details).    MDM Rules/Calculators/A&P                         Initial impression-presents with head trauma, alert, no acute stress vital signs reassuring.  Triage obtained imaging.  Work-up-head CT is negative for acute findings.   Rule out-low suspicion for intracranial head bleed as patient denies loss of conscious, is not on anticoagulant, she does not endorse headaches, paresthesia/weakness in the upper and lower extremities, no focal deficits present on my exam, CT head negative for acute findings.  Low suspicion for spinal cord abnormality or spinal fracture spine was palpated was nontender to palpation, patient has full range of motion in the upper and lower extremities.    Plan-  Head trauma-likely patient has mild concussion, will recommend  brain rest, over-the-counter pain medications, follow-up with PCP and/or concussion clinic for further evaluation.  Gave strict return precautions.  Vital signs have remained stable, no indication for hospital admission.    Patient given at home care as well strict return precautions.  Patient verbalized that they understood agreed to said plan.     Final Clinical Impression(s) / ED Diagnoses Final diagnoses:  Injury of head, initial encounter    Rx / DC Orders ED Discharge Orders     None        Marcello Fennel, PA-C 08/21/21 1716    Noemi Chapel, MD 08/22/21 1047

## 2021-08-21 NOTE — ED Triage Notes (Signed)
Patient going to [facility] due to being on blood thinners, tylenol not helping with pain and a gushing feeling in her after she hit her head on a shelf.

## 2021-08-21 NOTE — Discharge Instructions (Signed)
Likely you have a concussion, I recommend brain rest, please refrain from increased screen time, vigorous activities, mental stimulation as this will worsen your headaches.  Please stay hydrated, get plenty of rest, recommend over-the-counter pain medication as needed.  May follow-up with your PCP and/or the concussion clinic for further evaluation.  Come back to the emergency department if you develop chest pain, shortness of breath, severe abdominal pain, uncontrolled nausea, vomiting, diarrhea.

## 2021-08-27 ENCOUNTER — Other Ambulatory Visit: Payer: Self-pay | Admitting: Nurse Practitioner

## 2021-09-15 ENCOUNTER — Encounter (HOSPITAL_COMMUNITY): Payer: Self-pay | Admitting: Hematology

## 2021-09-22 ENCOUNTER — Other Ambulatory Visit: Payer: Self-pay | Admitting: Nurse Practitioner

## 2021-09-22 ENCOUNTER — Encounter: Payer: Self-pay | Admitting: Nurse Practitioner

## 2021-09-22 ENCOUNTER — Other Ambulatory Visit: Payer: Self-pay

## 2021-09-22 ENCOUNTER — Ambulatory Visit (INDEPENDENT_AMBULATORY_CARE_PROVIDER_SITE_OTHER): Payer: Medicare PPO | Admitting: Nurse Practitioner

## 2021-09-22 VITALS — BP 144/68 | HR 68 | Ht 60.0 in | Wt 168.1 lb

## 2021-09-22 DIAGNOSIS — R519 Headache, unspecified: Secondary | ICD-10-CM | POA: Diagnosis not present

## 2021-09-22 DIAGNOSIS — S060X0A Concussion without loss of consciousness, initial encounter: Secondary | ICD-10-CM | POA: Insufficient documentation

## 2021-09-22 DIAGNOSIS — E785 Hyperlipidemia, unspecified: Secondary | ICD-10-CM

## 2021-09-22 DIAGNOSIS — I10 Essential (primary) hypertension: Secondary | ICD-10-CM | POA: Diagnosis not present

## 2021-09-22 DIAGNOSIS — R7303 Prediabetes: Secondary | ICD-10-CM | POA: Diagnosis not present

## 2021-09-22 NOTE — Assessment & Plan Note (Addendum)
DASH diet and commitment to daily physical activity for a minimum of 30 minutes discussed and encouraged, as a part of hypertension management. The importance of attaining a healthy weight is also discussed.  BP/Weight 09/22/2021 08/21/2021 08/20/2021 08/11/2021 08/10/2021 08/01/2021 62/10/7626  Systolic BP 315 176 160 737 106 269 -  Diastolic BP 68 80 77 86 75 62 -  Wt. (Lbs) 168.08 160 163.04 164.6 163 - 162  BMI 32.83 31.25 31.84 32.15 31.83 31.64 -  Recently started on hydralazine 10 mg 3 times daily Continue current meds.  Patient advised to follow-up with hypertension clinic as planned she states that she has an upcoming appointment with them.

## 2021-09-22 NOTE — Assessment & Plan Note (Signed)
Lab Results  Component Value Date   HGBA1C 5.7 (H) 12/12/2019   Check A1c

## 2021-09-22 NOTE — Assessment & Plan Note (Addendum)
Patient referred to sports medicine to rule out concussion, Due to her recurrent  current headaches Had head trauma with negative CT scan a month ago. Patient told to use Tylenol as needed and get adequate rest.

## 2021-09-22 NOTE — Progress Notes (Signed)
° °  Nicole Horn     MRN: [id number]      DOB: Jan 11, 1946   HPI Nicole Horn is here for follow up for BP.  Pt stated that she hit her head at [facility] while beding over to get something a month ago. She has been getting left sided headaches on some days. She went to the ER next day after hitting her head, she was told that she had a mild concussion. Rest and compression, Lying down for few minutes helps her headache.  HA 7/10 is when it is worse.  Patient denies fever or chills recent changes in her vision nausea vomiting.    ROS Denies recent fever or chills. Denies sinus pressure, nasal congestion, ear pain or sore throat. Denies chest congestion, productive cough or wheezing. Denies chest pains, palpitations and leg swelling Denies abdominal pain, nausea, vomiting,diarrhea or constipation.   Denies dysuria, frequency, hesitancy or incontinence. Has  headaches, no seizures, numbness, or tingling.    PE  BP (!) 144/68 (BP Location: Left Arm, Cuff Size: Normal)    Pulse 68    Ht 5' (1.524 m)    Wt 168 lb 1.3 oz (76.2 kg)    SpO2 97%    BMI 32.83 kg/m   Patient alert and oriented and in no cardiopulmonary distress.  HEENT: No facial asymmetry, EOMI,     Neck supple .  Chest: Clear to auscultation bilaterally.  CVS: S1, S2 no murmurs, no S3.Regular rate.  ABD: Soft non tender.   Psych: Good eye contact, normal affect. Memory intact not anxious or depressed appearing.  CNS: CN 2-12 intact, power,  normal throughout.no focal deficits noted.   Assessment & Plan

## 2021-09-22 NOTE — Assessment & Plan Note (Signed)
Lab Results  Component Value Date   CHOL 115 08/10/2021   HDL 47 (L) 08/10/2021   LDLCALC 50 08/10/2021   TRIG 99 08/10/2021   CHOLHDL 2.4 08/10/2021  Check lipid panel.

## 2021-09-22 NOTE — Patient Instructions (Addendum)
Please get shingles vaccine, tdap vaccine and pneumonia vaccine at your pharmacy.   It is important that you exercise regularly at least 30 minutes 5 times a week.  Think about what you will eat, plan ahead. Choose " clean, green, fresh or frozen" over canned, processed or packaged foods which are more sugary, salty and fatty. 70 to 75% of food eaten should be vegetables and fruit. Three meals at set times with snacks allowed between meals, but they must be fruit or vegetables. Aim to eat over a 12 hour period , example 7 am to 7 pm, and STOP after  your last meal of the day. Drink water,generally about 64 ounces per day, no other drink is as healthy. Fruit juice is best enjoyed in a healthy way, by EATING the fruit.  Thanks for choosing [facility], we consider it a privelige to serve you.

## 2021-09-24 ENCOUNTER — Ambulatory Visit: Payer: Medicare Other | Admitting: Cardiology

## 2021-09-29 ENCOUNTER — Ambulatory Visit: Payer: Medicare PPO | Admitting: Family Medicine

## 2021-09-29 NOTE — Progress Notes (Deleted)
Subjective:   I, Peterson Lombard, LAT, ATC acting as a scribe for [name], MD.  Chief Complaint: Nicole Horn,  is a 76 y.o. female who presents for initial evaluation of a head injury. Pt hit her L-side of her forehead when sitting up. Pt was seen at the [facility] ED on 08/21/21 c/o HA and feeling slightly "off." Today, pt reports  Dx imaging: 08/21/21 Head CT  Injury date : 08/18/21 Visit #: 1  History of Present Illness:   Concussion Self-Reported Symptom Score Symptoms rated on a scale 1-6, in last 24 hours   Headache: ***    Nausea: ***  Dizziness: ***  Vomiting: ***  Balance Difficulty: ***   Trouble Falling Asleep: ***   Fatigue: ***  Sleep Less Than Usual: ***  Daytime Drowsiness: ***  Sleep More Than Usual: ***  Photophobia: ***  Phonophobia: ***  Irritability: ***  Sadness: ***  Numbness or Tingling: ***  Nervousness: ***  Feeling More Emotional: ***  Feeling Mentally Foggy: ***  Feeling Slowed Down: ***  Memory Problems: ***  Difficulty Concentrating: ***  Visual Problems: ***  Total # of Symptoms:  Total Symptom Score: ***  Neck Pain: Yes/No Tinnitus: Yes/No  Review of Systems:  ***    Review of History: ***  Objective:    Physical Examination There were no vitals filed for this visit. MSK:  *** Neuro: *** Psych: ***     Imaging:  ***  Assessment and Plan   76 y.o. female with ***    ***    Action/Discussion: Reviewed diagnosis, management options, expected outcomes, and the reasons for scheduled and emergent follow-up. Questions were adequately answered. Patient expressed verbal understanding and agreement with the following plan.     Patient Education: Reviewed with patient the risks (i.e, a repeat concussion, post-concussion syndrome, second-impact syndrome) of returning to play prior to complete resolution, and thoroughly reviewed the signs and symptoms of concussion.Reviewed need for complete resolution of  all symptoms, with rest AND exertion, prior to return to play. Reviewed red flags for urgent medical evaluation: worsening symptoms, nausea/vomiting, intractable headache, musculoskeletal changes, focal neurological deficits. [facility]'s Concussion Care Plan, which clearly outlines the plans stated above, was given to patient.   Level of service: ***     After Visit Summary printed out and provided to patient as appropriate.  The above documentation has been reviewed and is accurate and complete Mare Ferrari

## 2021-09-30 NOTE — Progress Notes (Signed)
Subjective:    Chief Complaint: Nicole Horn, LAT, ATC, am serving as scribe for Dr. [name].  Nicole Horn,  is a 76 y.o. female who presents for evaluation of a head injury that occurred on 08/18/21 when she hit the L side of her head on a display at [place] while bending over to pick something up.  She was seen at the [facility] ED on 08/21/21 w/ c/o con't HA and was later seen by her PCP on 09/22/21 w/ the same c/o.  Today, pt reports feeling pretty good today, but c/o HA in the mornings or later in the day, "lightheaded," dizziness, and intermittent nausea.   Biggest issue is headache and insomnia.  Dx imaging: 08/21/21 Head CT  Injury date : 08/18/21 Visit #: 1  History of Present Illness:   Concussion Self-Reported Symptom Score Symptoms rated on a scale 1-6, in last 24 hours   Headache: 4    Nausea: 3  Dizziness: 4  Vomiting: 0  Balance Difficulty: 4   Trouble Falling Asleep: 6   Fatigue: 5  Sleep Less Than Usual: 6  Daytime Drowsiness: 5  Sleep More Than Usual: 0  Photophobia: 3  Phonophobia: 4  Irritability: 4  Sadness: 0  Numbness or Tingling: 0  Nervousness: 4  Feeling More Emotional: 5  Feeling Mentally Foggy: 5  Feeling Slowed Down: 5  Memory Problems: 6  Difficulty Concentrating: 4  Visual Problems: 5   Total # of Symptoms: 18/22 Total Symptom Score: 82/132  Neck Pain: not currently, but will experience intermittent neck/trapz pain bilat Tinnitus: No  Review of Systems: No fevers or chills    Review of History: Hypertension.  Peripheral vascular disease.  Objective:    Physical Examination Vitals:   10/01/21 1130  BP: (!) 150/94  Pulse: 85  SpO2: 98%   MSK: C-spine: Normal. Nontender midline. Nontender paraspinal musculature. Decreased cervical motion to left rotation and lateral flexion otherwise normal. Upper summary strength is intact. Neuro psych: Alert and oriented normal speech thought process and affect.  Normal  gait.      Imaging:  EXAM: CT HEAD WITHOUT CONTRAST   TECHNIQUE: Contiguous axial images were obtained from the base of the skull through the vertex without intravenous contrast.   COMPARISON:  None.   FINDINGS: Brain: No evidence of acute infarction, hemorrhage, hydrocephalus, extra-axial collection or mass lesion/mass effect.   Vascular: No hyperdense vessel or unexpected calcification.   Skull: Normal. Negative for fracture or focal lesion.   Sinuses/Orbits: No acute finding.   Other: None.   IMPRESSION: No acute intracranial abnormality seen.     Electronically Signed   By: Marijo Conception M.D.   On: 08/21/2021 15:14   I, [name], personally (independently) visualized and performed the interpretation of the images attached in this note.   Assessment and Plan   76 y.o. female with concussion without loss of consciousness occurring about 6 weeks ago.  Symptomatic in multiple domains with some symptoms in the more severe category.  Headache: Plan to add nortriptyline at bedtime and low-dose.  If this is not effective we will switch to Topamax.  Insomnia: Quite a problem.  Again nortriptyline at bedtime should be helpful.  If this is not helpful would consider trazodone.  Cognitive: Refer to speech therapy for cognitive rehab.  She lives in [place] so I have asked the speech therapist at [facility] if she does cognitive rehab if not neuro rehab in [place] would be next option.  Balance and dizzy: Consider vestibular physical therapy.  Reassess this at next visit.  Mood: Again reassess next visit.    Recheck in 3 weeks.    Action/Discussion: Reviewed diagnosis, management options, expected outcomes, and the reasons for scheduled and emergent follow-up. Questions were adequately answered. Patient expressed verbal understanding and agreement with the following plan.     Patient Education: Reviewed with patient the risks (i.e, a repeat concussion,  post-concussion syndrome, second-impact syndrome) of returning to play prior to complete resolution, and thoroughly reviewed the signs and symptoms of concussion.Reviewed need for complete resolution of all symptoms, with rest AND exertion, prior to return to play. Reviewed red flags for urgent medical evaluation: worsening symptoms, nausea/vomiting, intractable headache, musculoskeletal changes, focal neurological deficits. [facility]'s Concussion Care Plan, which clearly outlines the plans stated above, was given to patient.   Level of service: Total encounter time 45 minutes including face-to-face time with the patient and, reviewing past medical record, and charting on the date of service.        After Visit Summary printed out and provided to patient as appropriate.  The above documentation has been reviewed and is accurate and complete [name]

## 2021-10-01 ENCOUNTER — Other Ambulatory Visit: Payer: Self-pay

## 2021-10-01 ENCOUNTER — Ambulatory Visit (INDEPENDENT_AMBULATORY_CARE_PROVIDER_SITE_OTHER): Payer: Medicare PPO | Admitting: Family Medicine

## 2021-10-01 VITALS — BP 150/94 | HR 85 | Ht 60.0 in | Wt 162.4 lb

## 2021-10-01 DIAGNOSIS — S060X0A Concussion without loss of consciousness, initial encounter: Secondary | ICD-10-CM

## 2021-10-01 DIAGNOSIS — R4189 Other symptoms and signs involving cognitive functions and awareness: Secondary | ICD-10-CM

## 2021-10-01 DIAGNOSIS — G44321 Chronic post-traumatic headache, intractable: Secondary | ICD-10-CM | POA: Diagnosis not present

## 2021-10-01 DIAGNOSIS — R4789 Other speech disturbances: Secondary | ICD-10-CM | POA: Diagnosis not present

## 2021-10-01 MED ORDER — NORTRIPTYLINE HCL 10 MG PO CAPS: 10.0000 mg | ORAL_CAPSULE | Freq: Every day | ORAL | 1 refills | Status: DC

## 2021-10-01 NOTE — Patient Instructions (Addendum)
Thank you for coming in today.   I've referred you to Speech Therapy.  Let us know if you don't hear from them in one week.   I've sent a prescription for Nortriptyline to take at bedtime (1-2 pills)  Recheck back in 3 weeks

## 2021-10-12 NOTE — Progress Notes (Incomplete)
Advanced Hypertension Clinic Initial Assessment:    Date:  10/13/2021   ID:  Nicole Horn, DOB May 10, 1946, MRN [id number]  PCP:  Renee Rival, FNP  Cardiologist:  Rozann Lesches, MD  Nephrologist:  Referring MD: Noreene Larsson, NP   CC: Hypertension  History of Present Illness:    Nicole Horn is a 76 y.o. female with a hx of hypertension, hyperlipidemia, PAD s/p PTCA/stent, MI (2004), diabetes type 2, sleep apnea not on CPAP, here to establish care in the Advanced Hypertension Clinic.   She saw Dr. Domenic Polite 12/2020 and reported palpitations. She wore a monitor which showed PACs, PVCs, and brief episodes of SVT.   She saw Demetrius Revel, NP on 08/20/2021 and her blood pressure was 173/77 on max dose of olmesartan and amlodipine, HCTZ, and spironolactone. She was started on 10 mg hydralazine and referred to the [facility].  Previously she presented to the ED 08/01/2021 for generalized weakness and feeling ill for 1 week. She reported multiple falls due to her weakness. She was febrile and tested positive for influenza. On 08/21/2021 she was seen in the ED for a mild concussion.  Today,  She denies any palpitations, chest pain, shortness of breath, or peripheral edema. No lightheadedness, headaches, syncope, orthopnea, or PND.  (+)  Previous antihypertensives:   Secondary Causes of Hypertension  Medications/Herbal: OCP, steroids, stimulants, antidepressants, weight loss medication, immune suppressants, NSAIDs, sympathomimetics, alcohol, caffeine, licorice, ginseng, St. 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   Type 2 diabetes mellitus ([place])     Past Surgical History:  Procedure Laterality Date   ABDOMINAL HYSTERECTOMY     BACK SURGERY     BARTHOLIN GLAND CYST EXCISION Left 03/04/2015   Procedure: EXCISION OF LEFT BARTHOLINS TUMOR;  Surgeon: [name], MD;  Location: [facility];  Service: Gynecology;  Laterality: Left;   CATARACT EXTRACTION W/PHACO Right 03/27/2021   Procedure: CATARACT EXTRACTION PHACO AND INTRAOCULAR LENS PLACEMENT (IOC);  Surgeon: Baruch Goldmann, MD;  Location: [facility];  Service: Ophthalmology;  Laterality: Right;  cde 8.45   CERVICAL SPINE SURGERY     COLONOSCOPY N/A 02/01/2020   Procedure: COLONOSCOPY;  Surgeon: Daneil Dolin, MD; Scattered medium mouth diverticula in the sigmoid and descending colon, otherwise normal exam.   CYST REMOVAL TRUNK     ESOPHAGOGASTRODUODENOSCOPY (EGD) WITH PROPOFOL N/A 03/27/2020   Procedure: ESOPHAGOGASTRODUODENOSCOPY (EGD) WITH PROPOFOL;  Surgeon: Daneil Dolin, MD;  Location: [facility];  Service: Endoscopy;  Laterality: N/A;  1:00pm   GIVENS CAPSULE STUDY N/A 03/27/2020   Procedure: GIVENS CAPSULE STUDY;  Surgeon: Daneil Dolin, MD;  Location: [facility];  Service: Endoscopy;  Laterality: N/A;   Multiple cyst removal surgeries     Stent of lower extremities      Current Medications: No outpatient medications have been marked as taking for the 10/13/21 encounter (Appointment) with  Skeet Latch, MD.     Allergies:   Patient has no known allergies.   Social History   Socioeconomic History   Marital status: Widowed    Spouse name: Not on file   Number of children: 1   Years of education: Not on file   Highest education level: Some college, no degree  Occupational History   Not on file  Tobacco Use   Smoking status: Former    Packs/day: 2.00    Years: 25.00    Pack years: 50.00    Types: Cigarettes    Quit date: 03/10/1985    Years since quitting: 36.6   Smokeless tobacco: Never  Vaping Use   Vaping Use: Never used  Substance and Sexual Activity   Alcohol use: Not Currently    Alcohol/week: 0.0 standard drinks    Comment: socially   Drug use: No   Sexual activity: Yes    Birth control/protection: Surgical  Other Topics Concern   Not on file  Social History Narrative   Lives in [place] and [place] 6 months here and there owns an apt in [place] alone, but helps to care for mother who is 54 years (goes between the kids)   Sister is in [place] is in [place]      Son who is 5 years old lives in [place], [place]    Has 6 grand kids and 7 great grands      Enjoys exercise (harder since COVID)-dancing      Diet: eats all foods-enjoys smoothies, avoids fried foods, limited red meat if any   Caffeine: coffee 1 cup in the morning-sometimes will have 1 in evening    Water: 3-4 bottles daily       Wears seat belt   Smoke detectors in home   Does not use phone while driving            Social Determinants of Health   Financial Resource Strain: Not on file  Food Insecurity: Not on file  Transportation Needs: Not on file  Physical Activity: Not on file  Stress: Not on file  Social Connections: Not on file     Family History: The patient's family history includes Breast cancer in her sister; Colon cancer in her mother; Healthy in her brother; Heart attack in her paternal grandmother; Stomach cancer in her maternal aunt;  Stroke in her paternal grandmother.  ROS:   Please see the history of present illness.     All other systems reviewed and are negative.  EKGs/Labs/Other Studies Reviewed:    Monitor 01/2021: ZIO XT reviewed.  8 days, 11 hours analyzed.  Predominant rhythm is sinus with heart rate ranging from 48 bpm up to 118 bpm and average heart rate 76 bpm.  There were rare PACs including couplets and triplets representing less than 1% total beats.  Rare PVCs were noted representing less than 1% total beats.  There were 2 very brief episodes of SVT, the longest of which was only 5 beats.  No sustained arrhythmias or pauses.  Bilateral LE Arterial Dopplers 03/04/2020: Summary:  Right: 30-49% stenosis noted in the superficial femoral artery. 30-49%  stenosis noted in the anterior tibial artery. Stent struts were not  visualized.  Left: 30-49% stenosis noted in the superficial femoral artery. Stents  struts were not visualized.   Echo 03/04/2020: 1. Left ventricular ejection fraction, by estimation, is 60 to 65%. The  left ventricle has normal function. The left ventricle has no regional  wall motion abnormalities. There is mild left ventricular hypertrophy.  Left ventricular diastolic parameters  are consistent with Grade I diastolic dysfunction (impaired relaxation).   2. Right ventricular systolic function is normal. The right ventricular  size is normal. There is normal pulmonary artery systolic pressure.   3. Right atrial size was mildly dilated.   4. The mitral valve is normal in structure. No evidence of mitral valve  regurgitation. No evidence of mitral stenosis.   5. The aortic valve is tricuspid. Aortic valve regurgitation is not  visualized. No aortic stenosis is present.   6. The inferior vena cava is normal in size with greater than 50%  respiratory variability, suggesting right atrial pressure of 3 mmHg.   EKG:   10/13/2021: Sinus ***. Rate *** bpm.  Recent Labs: 08/10/2021: ALT  29; Hemoglobin 12.6; Platelets 411 08/20/2021: BUN 13; Creatinine, Ser 0.94; Potassium 4.1; Sodium 144   Recent Lipid Panel    Component Value Date/Time   CHOL 115 08/10/2021 1218   CHOL 139 05/13/2021 1358   TRIG 99 08/10/2021 1218   HDL 47 (L) 08/10/2021 1218   HDL 66 05/13/2021 1358   CHOLHDL 2.4 08/10/2021 1218   LDLCALC 50 08/10/2021 1218    Physical Exam:    VS:  There were no vitals taken for this visit. , BMI There is no height or weight on file to calculate BMI. GENERAL:  Well appearing HEENT: Pupils equal round and reactive, fundi not visualized, oral mucosa unremarkable NECK:  No jugular venous distention, waveform within normal limits, carotid upstroke brisk and symmetric, no bruits, no thyromegaly LYMPHATICS:  No cervical adenopathy LUNGS:  Clear to auscultation bilaterally HEART:  RRR.  PMI not displaced or sustained,S1 and S2 within normal limits, no S3, no S4, no clicks, no rubs, *** murmurs ABD:  Flat, positive bowel sounds normal in frequency in pitch, no bruits, no rebound, no guarding, no midline pulsatile mass, no hepatomegaly, no splenomegaly EXT:  2 plus pulses throughout, no edema, no cyanosis no clubbing SKIN:  No rashes no nodules NEURO:  Cranial nerves II through XII grossly intact, motor grossly intact throughout PSYCH:  Cognitively intact, oriented to person place and time   ASSESSMENT/PLAN:    No problem-specific Assessment & Plan notes found for this encounter.   Screening for Secondary Hypertension: { Click here to document screening for secondary causes of HTN  :[id number]}    Relevant Labs/Studies: Basic Labs Latest Ref Rng & Units 08/20/2021 08/10/2021 08/01/2021  Sodium 134 - 144 mmol/L 144 147(H) 137  Potassium 3.5 - 5.2 mmol/L 4.1 4.6 3.3(L)  Creatinine 0.57 - 1.00 mg/dL 0.94 1.10(H) 1.53(H)                       she consents to be monitored in our remote patient monitoring program through [facility].  she will track his blood  pressure twice daily and understands that these trends will help [place] to adjust her medications as needed prior to his next appointment.  she *** interested in enrolling in the PREP exercise and nutrition program through the [facility].     Disposition:   *** FU with APP/PharmD in 1 month for the next 3 months.   FU with  Tiffany C. Oval Linsey, MD, [facility] in 4 months.   Medication Adjustments/Labs and Tests Ordered: Current medicines are reviewed at length with the patient today.  Concerns regarding medicines are outlined above.   No orders of the defined types were placed in this encounter.  No orders of the defined types were placed in this encounter.   I,Nicole Horn,acting as a Education administrator for Skeet Latch, MD.,have documented all relevant documentation on the behalf of Skeet Latch, MD,as directed by  Skeet Latch, MD while in the presence of Skeet Latch, MD.  ***  Signed, Nicole Horn  10/13/2021 1:35 PM    [place]

## 2021-10-13 ENCOUNTER — Ambulatory Visit (HOSPITAL_BASED_OUTPATIENT_CLINIC_OR_DEPARTMENT_OTHER): Payer: Medicare PPO | Admitting: Cardiovascular Disease

## 2021-10-19 ENCOUNTER — Ambulatory Visit (HOSPITAL_COMMUNITY): Payer: Medicare PPO | Attending: Family Medicine | Admitting: Speech Pathology

## 2021-10-19 ENCOUNTER — Encounter (HOSPITAL_COMMUNITY): Payer: Self-pay | Admitting: Speech Pathology

## 2021-10-19 ENCOUNTER — Other Ambulatory Visit: Payer: Self-pay

## 2021-10-19 DIAGNOSIS — R4789 Other speech disturbances: Secondary | ICD-10-CM | POA: Insufficient documentation

## 2021-10-19 DIAGNOSIS — R41841 Cognitive communication deficit: Secondary | ICD-10-CM | POA: Diagnosis not present

## 2021-10-19 DIAGNOSIS — R413 Other amnesia: Secondary | ICD-10-CM | POA: Insufficient documentation

## 2021-10-19 NOTE — Therapy (Signed)
[facility] [place], [place], [address] Phone: [phone]   Fax:  [phone]  Speech Language Pathology Evaluation  Patient Details  Name: Nicole Horn MRN: [id number] Date of Birth: 1946-08-19 Referring Provider (SLP): [name], MD   Encounter Date: 10/19/2021   End of Session - 10/19/21 1135     Visit Number 1    Number of Visits 9    Date for SLP Re-Evaluation 11/19/21    Authorization Type [employer] Medicare   [employer] mcr ppo eff 08/23/2021-current  $40.00 co-pay  no ded  oop max $8,300.00/ $90.00 met  no visit limit  no co-ins  auth req via cohere   SLP Start Time 1040    SLP Stop Time  1125    SLP Time Calculation (min) 45 min    Activity Tolerance Patient tolerated treatment well             Past Medical History:  Diagnosis Date   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   Type 2 diabetes mellitus ([place])     Past Surgical History:  Procedure Laterality Date   ABDOMINAL HYSTERECTOMY     BACK SURGERY     BARTHOLIN GLAND CYST EXCISION Left 03/04/2015   Procedure: EXCISION OF LEFT BARTHOLINS TUMOR;  Surgeon: [name], MD;  Location: [facility];  Service: Gynecology;  Laterality: Left;   CATARACT EXTRACTION W/PHACO Right 03/27/2021   Procedure: CATARACT EXTRACTION PHACO AND INTRAOCULAR LENS PLACEMENT (IOC);  Surgeon: Baruch Goldmann, MD;  Location: [facility];  Service: Ophthalmology;  Laterality: Right;  cde 8.45    CERVICAL SPINE SURGERY     COLONOSCOPY N/A 02/01/2020   Procedure: COLONOSCOPY;  Surgeon: Daneil Dolin, MD; Scattered medium mouth diverticula in the sigmoid and descending colon, otherwise normal exam.   CYST REMOVAL TRUNK     ESOPHAGOGASTRODUODENOSCOPY (EGD) WITH PROPOFOL N/A 03/27/2020   Procedure: ESOPHAGOGASTRODUODENOSCOPY (EGD) WITH PROPOFOL;  Surgeon: Daneil Dolin, MD;  Location: [facility];  Service: Endoscopy;  Laterality: N/A;  1:00pm   GIVENS CAPSULE STUDY N/A 03/27/2020   Procedure: GIVENS CAPSULE STUDY;  Surgeon: Daneil Dolin, MD;  Location: [facility];  Service: Endoscopy;  Laterality: N/A;   Multiple cyst removal surgeries     Stent of lower extremities      There were no vitals filed for this visit.   Subjective Assessment - 10/19/21 1628     Subjective "I know it, but I can't bring it up."    Special Tests [facility] SLUMS    Currently in Pain? No/denies                SLP Evaluation [facility] - 10/19/21 1106       SLP Visit Information   SLP Received On 10/19/21    Referring Provider (SLP) [name], MD    Onset Date 08/18/2021    Medical Diagnosis post concussive syndrome  Subjective   Subjective "My memory is terrible now."    Patient/Family Stated Goal Improve memory      General Information   HPI Nicole Horn,  is a 76 y.o. female who presents for evaluation of a head injury that occurred on 08/18/21 when she hit the L side of her head on a display at [place] while bending over to pick something up.  She was [facility] at the [facility] ED on 08/21/21 w/ c/o con't HA and was later seen by her PCP on 09/22/21 w/ the same c/o. Pt was referred for cognitive rehab by Dr. [name] due to post concussion symptoms of impaired attention, memory, and word finding. Head CT completed on 08/21/21 was unremarkable. She recently started taking nortriptyline, which has helped her headaches and sleep.    Behavioral/Cognition alert and cooperative    Mobility  Status ambulatory      Balance Screen   Has the patient fallen in the past 6 months Yes    How many times? 4    Has the patient had a decrease in activity level because of a fear of falling?  Yes    Is the patient reluctant to leave their home because of a fear of falling?  No      Prior Functional Status   Cognitive/Linguistic Baseline Within functional limits    Type of Home Apartment     Lives With Alone   Mom stays with her at times, she is 85   Available Support Family    Education some college    Vocation Retired      Associate Professor   Overall Cognitive Status Impaired/Different from baseline    Area of Impairment Attention;Memory    Current Attention Level Sustained    Attention Comments needed repetition for immediate memory recall    Memory Decreased short-term memory    Memory Comments 4/5 immediate recall, 5/5 delayed, 6/8 story recall    Attention Sustained    Sustained Attention Impaired    Sustained Attention Impairment Verbal complex    Memory Impaired    Memory Impairment Storage deficit;Decreased recall of new information;Prospective memory;Decreased short term memory    Decreased Short Term Memory Verbal complex    Awareness Appears intact    Problem Solving Appears intact    Executive Function Organizing;Self Correcting;Self Monitoring    Organizing Impaired    Organizing Impairment Verbal complex    Self Monitoring Impaired    Self Monitoring Impairment Verbal complex;Functional complex      Auditory Comprehension   Overall Auditory Comprehension Appears within functional limits for tasks assessed    Yes/No Questions Within Functional Limits    Commands Within Functional Limits    Conversation Complex    Interfering Components Attention;Working Recruitment consultant Within [employer]      Reading Comprehension   Reading Status Within funtional limits      Expression   Primary Mode of Expression Verbal       Verbal Expression   Overall Verbal Expression Impaired    Initiation No impairment    Automatic Speech Name;Social Response    Level of Generative/Spontaneous Verbalization Conversation    Repetition No impairment    Naming Impairment    Responsive 76-100% accurate    Confrontation 50-74% accurate   [employer] Short test: 5/15   Convergent Not tested    Divergent 50-74% accurate   11 animals in 60 seconds   Verbal Errors  Semantic paraphasias   telscope for stethascope   Pragmatics No impairment    Interfering Components Attention    Effective Techniques Phonemic cues    Non-Verbal Means of Communication Not applicable      Written Expression   Dominant Hand Left    Written Expression Not tested      Oral Motor/Sensory Function   Overall Oral Motor/Sensory Function Appears within functional limits for tasks assessed      Motor Speech   Overall Motor Speech Appears within functional limits for tasks assessed    Respiration Within functional limits    Phonation Normal    Resonance Within functional limits    Articulation Within functional limitis    Intelligibility Intelligible    Motor Planning Witnin functional limits    Motor Speech Errors Not applicable    Phonation WFL      Standardized Assessments   Standardized Assessments  --   SLUMS ([place] Mental Status Examination)           [place] SLUMS Examination Orientation  3/3  Numeric Problem Solving  1/3  Memory  5/5  Attention 0/2  Thought Organization 2/3  Clock Drawing 2/4  Visuospatial Skills               2/2  Short Story Recall  6/8  Total  21/30     Scoring  High School Education  Less than High School Education   Normal  27-30 25-30  Mild Neurocognitive Disorder 21-26 20-24  Dementia  1-20 1-19      SLP Education - 10/19/21 1637     Education Details Plan for cognitive commuhication therapy 2x/week for 4 weeks, provided memory strategies this date    Person(s) Educated Patient     Methods Explanation;Handout    Comprehension Need further instruction              SLP Short Term Goals - 10/19/21 1641       SLP SHORT TERM GOAL #1   Title Pt will complete selective and alternating attention tasks (moderately complex) with 90% acc and min cues.    Baseline ~80%    Time 4    Status New    Target Date 11/26/21      SLP SHORT TERM GOAL #2   Title Pt will increase recall for requested information to 95% acc during functional memory exercises with use of compensatory strategies as needed when provided min cues.    Baseline 75%    Time 4    Period Weeks    Status New    Target Date 11/26/21      SLP SHORT TERM GOAL #3   Title Pt will utilize external memory strategies in home environment by recording 3 items daily in planner, notebook, daily memory writing task daily for 5/7 days    Baseline Pt has system but is still paying bills late and missing appointments    Time 4    Period Weeks    Status New    Target Date 11/26/21      SLP SHORT TERM GOAL #4   Title Pt will plan and execute hypothetical problem solving tasks that involve multitasking with 95% acc with min assist from SLP for strategies.    Baseline mod assist    Time 4    Period Weeks    Status New    Target Date 11/26/21  Plan - 10/19/21 1639     Clinical Impression Statement Pt presents with mild to mild/mod cognitive linguistic deficits characterized by impaired attention, memory, confrontation naming, and word finding. Pt had difficulty with immediate memory tasks, however with repetition, she was able to increase accuracy and then complete the delayed recall task. The [employer], short form was administered and Pt scored a 5/15 indicating significant deficits in confrontation naming an word recall. This was also noted in conversation, as Pt exhibited circumlocution in conversations. She reports missing bill payments and now has to write everything down to  facilitate recall. Pt will benefit from skilled SLP in order to address the above impairments, maximize independence, and decrease burden of care. She is motivated for therapy.    Speech Therapy Frequency 2x / week    Duration 4 weeks    Treatment/Interventions Compensatory strategies;Patient/family education;Cueing hierarchy;SLP instruction and feedback;Compensatory techniques;Cognitive reorganization;Functional tasks    Potential to Achieve Goals Good    Potential Considerations Ability to learn/carryover information    SLP Home Exercise Plan Pt will completed HEP as assigned to facilitate carryover of treatment strategies and techniques in home environment with use of written cues as needed.    Consulted and Agree with Plan of Care Patient             Patient will benefit from skilled therapeutic intervention in order to improve the following deficits and impairments:   Cognitive communication deficit  Memory change    Problem List Patient Active Problem List   Diagnosis Date Noted   Concussion with no loss of consciousness 09/22/2021   Persistent headaches 09/22/2021   Frequent headaches 09/22/2021   Prediabetes 09/22/2021   LFTs abnormal 02/06/2021   Herpes 01/02/2021   Palpitations 11/27/2020   Depression, major, single episode, severe ([place]) 10/22/2020   Insomnia 10/22/2020   Caregiver role strain 07/23/2020   Primary osteoarthritis of first carpometacarpal joint of right hand 07/16/2020   Annual visit for general adult medical examination with abnormal findings 04/16/2020   Postmenopausal 04/16/2020   Screening mammogram, encounter for 04/16/2020   Atypical mole 04/16/2020   Need for immunization against influenza 04/16/2020   Constipation 03/17/2020   PVD (peripheral vascular disease) ([place]) 03/14/2020   CAD (coronary artery disease) 03/14/2020   Iron deficiency anemia 03/14/2020   DOE (dyspnea on exertion) 03/10/2020   H/O adenomatous polyp of colon 01/02/2020    FH: colon cancer 01/02/2020   Heart disease 12/11/2019   Upper airway cough syndrome 11/13/2019   Overweight with body mass index (BMI) of 29 to 29.9 in adult 10/16/2019   Essential hypertension 09/10/2019   Hyperlipemia 09/10/2019   Thank you,  Genene Churn, [place]  Genene Churn [place] 10/19/2021, 4:46 PM  [facility] [address] [place], [place], [address] Phone: [phone]   Fax:  [phone]  Name: Starlina Lapre MRN: [id number] Date of Birth: 1945-09-26

## 2021-10-20 ENCOUNTER — Ambulatory Visit (INDEPENDENT_AMBULATORY_CARE_PROVIDER_SITE_OTHER): Payer: Medicare PPO | Admitting: Family Medicine

## 2021-10-20 DIAGNOSIS — Z0000 Encounter for general adult medical examination without abnormal findings: Secondary | ICD-10-CM

## 2021-10-20 NOTE — Patient Instructions (Addendum)
°  Nicole Horn , Thank you for taking time to come for your Medicare Wellness Visit. I appreciate your ongoing commitment to your health goals. Please review the following plan we discussed and let me know if I can assist you in the future.   A referral was placed to social work and a therapist; they will contact you in a few days to assist you with the needs expressed during our visit.    These are the goals we discussed:  Goals      Patient Stated     Get pneumonia vaccine     Prevent falls        This is a list of the screening recommended for you and due dates:  Health Maintenance  Topic Date Due   Tetanus Vaccine  Never done   Zoster (Shingles) Vaccine (1 of 2) Never done   Pneumonia Vaccine (1 - PCV) Never done   COVID-19 Vaccine (3 - Moderna risk series) 08/05/2021   Mammogram  05/01/2022   Colon Cancer Screening  01/31/2030   Flu Shot  Completed   DEXA scan (bone density measurement)  Completed   Hepatitis C Screening: USPSTF Recommendation to screen - Ages 18-79 yo.  Completed   HPV Vaccine  Aged Out

## 2021-10-20 NOTE — Progress Notes (Signed)
Subjective:   Nicole Horn is a 76 y.o. female who presents for Medicare Annual (Subsequent) preventive examination.  Review of Systems    I connected with  [name] on 10/20/21 by a audio enabled telemedicine application and verified that I am speaking with the correct person using two identifiers.  Patient Location: Home  Provider Location: Office/Clinic  I discussed the limitations of evaluation and management by telemedicine. The patient expressed understanding and agreed to proceed.  Cardiac Risk Factors include: advanced age (>51men, >85 women);obesity (BMI >30kg/m2);diabetes mellitus     Objective:    Today's Vitals   10/20/21 1058 10/20/21 1102  PainSc: 0-No pain 0-No pain   There is no height or weight on file to calculate BMI.  Advanced Directives 10/19/2021 08/21/2021 07/31/2021 05/27/2021 03/27/2021 01/20/2021 09/15/2020  Does Patient Have a Medical Advance Directive? Yes Yes No Yes Yes Yes Yes  Type of Advance Directive Living will;Healthcare Power of [place];Living will - [place];Living will Healthcare Power of Attorney Living will;Healthcare Power of [place];Living will  Does patient want to make changes to medical advance directive? - - - - - No - Patient declined No - Patient declined  Copy of [place] in Chart? No - copy requested - - No - copy requested No - copy requested No - copy requested No - copy requested  Would patient like information on creating a medical advance directive? - - - - - - -    Current Medications (verified) Outpatient Encounter Medications as of 10/20/2021  Medication Sig   albuterol (VENTOLIN HFA) 108 (90 Base) MCG/ACT inhaler Inhale 1-2 puffs into the lungs every 6 (six) hours as needed for wheezing or shortness of breath.   amLODipine (NORVASC) 10 MG tablet Take 1 tablet (10 mg total) by mouth daily. (Patient taking  differently: Take 10 mg by mouth in the morning.)   aspirin EC 81 MG tablet Take 81 mg by mouth in the morning. Swallow whole.   Calcium Carbonate Antacid (CALCIUM CARBONATE PO) Take 1,200 mg of elemental calcium by mouth in the morning.   clopidogrel (PLAVIX) 75 MG tablet TAKE 1 TABLET BY MOUTH  DAILY   empagliflozin (JARDIANCE) 10 MG TABS tablet Take by mouth.   ferrous sulfate 325 (65 FE) MG EC tablet Take 1 tablet by mouth every morning.   ferrous sulfate 325 (65 FE) MG EC tablet Take by mouth.   hydrALAZINE (APRESOLINE) 10 MG tablet Take 1 tablet (10 mg total) by mouth 3 (three) times daily.   loperamide (IMODIUM) 2 MG capsule Take 1 capsule (2 mg total) by mouth 4 (four) times daily as needed for diarrhea or loose stools.   meloxicam (MOBIC) 7.5 MG tablet Take 7.5 mg by mouth daily as needed for pain.   moxifloxacin (VIGAMOX) 0.5 % ophthalmic solution Place 1 drop into the right eye 2 (two) times daily.   Multiple Vitamin (MULTIVITAMIN WITH MINERALS) TABS tablet Take 1 tablet by mouth in the morning. Centrum   nortriptyline (PAMELOR) 10 MG capsule Take 1 capsule (10 mg total) by mouth at bedtime.   olmesartan-hydrochlorothiazide (BENICAR HCT) 40-25 MG tablet Take 1 tablet by mouth daily.   ondansetron (ZOFRAN) 4 MG tablet Take 1 tablet (4 mg total) by mouth every 6 (six) hours as needed for nausea or vomiting.   prednisoLONE acetate (PRED FORTE) 1 % ophthalmic suspension Place 1 drop into the right eye 4 (four) times daily.  Probiotic Product (DIGESTIVE ADVANTAGE) CAPS Take 1 capsule by mouth daily.   rosuvastatin (CRESTOR) 40 MG tablet Take 1 tablet (40 mg total) by mouth daily. (Patient taking differently: Take 40 mg by mouth in the morning.)   spironolactone (ALDACTONE) 25 MG tablet Take 1 tablet (25 mg total) by mouth daily.   valACYclovir (VALTREX) 500 MG tablet Take 500 mg by mouth as needed.   vitamin C (ASCORBIC ACID) 500 MG tablet Take 500 mg by mouth 2 (two) times a week.   No  facility-administered encounter medications on file as of 10/20/2021.    Allergies (verified) Patient has no known allergies.   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   Type 2 diabetes mellitus ([place])    Past Surgical History:  Procedure Laterality Date   ABDOMINAL HYSTERECTOMY     BACK SURGERY     BARTHOLIN GLAND CYST EXCISION Left 03/04/2015   Procedure: EXCISION OF LEFT BARTHOLINS TUMOR;  Surgeon: [name], MD;  Location: [facility];  Service: Gynecology;  Laterality: Left;   CATARACT EXTRACTION W/PHACO Right 03/27/2021   Procedure: CATARACT EXTRACTION PHACO AND INTRAOCULAR LENS PLACEMENT (IOC);  Surgeon: Baruch Goldmann, MD;  Location: [facility];  Service: Ophthalmology;  Laterality: Right;  cde 8.45   CERVICAL SPINE SURGERY     COLONOSCOPY N/A 02/01/2020   Procedure: COLONOSCOPY;  Surgeon: Daneil Dolin, MD; Scattered medium mouth diverticula in the sigmoid and descending colon, otherwise normal exam.   CYST REMOVAL TRUNK     ESOPHAGOGASTRODUODENOSCOPY (EGD) WITH PROPOFOL N/A 03/27/2020   Procedure: ESOPHAGOGASTRODUODENOSCOPY (EGD) WITH PROPOFOL;  Surgeon: Daneil Dolin, MD;  Location: [facility];  Service: Endoscopy;  Laterality: N/A;  1:00pm   GIVENS CAPSULE STUDY N/A 03/27/2020   Procedure: GIVENS CAPSULE STUDY;  Surgeon: Daneil Dolin, MD;  Location: [facility];  Service: Endoscopy;  Laterality: N/A;   Multiple cyst removal surgeries      Stent of lower extremities     Family History  Problem Relation Age of Onset   Colon cancer Mother        In her 57s   Breast cancer Sister    Stomach cancer Maternal Aunt    Healthy Brother    Stroke Paternal Grandmother    Heart attack Paternal Grandmother    Social History   Socioeconomic History   Marital status: Widowed    Spouse name: Not on file   Number of children: 1   Years of education: Not on file   Highest education level: Some college, no degree  Occupational History   Not on file  Tobacco Use   Smoking status: Former    Packs/day: 2.00    Years: 25.00    Pack years: 50.00    Types: Cigarettes    Quit date: 03/10/1985    Years since quitting: 36.6   Smokeless tobacco: Never  Vaping  Use   Vaping Use: Never used  Substance and Sexual Activity   Alcohol use: Not Currently    Alcohol/week: 0.0 standard drinks    Comment: socially   Drug use: No   Sexual activity: Yes    Birth control/protection: Surgical  Other Topics Concern   Not on file  Social History Narrative   Lives in [place] and [place] 6 months here and there owns an apt in [place] alone, but helps to care for mother who is 78 years (goes between the kids)   Sister is in [place] is in [place]      Son who is 30 years old lives in [place], [place]    Has 6 grand kids and 7 great grands      Enjoys exercise (harder since COVID)-dancing      Diet: eats all foods-enjoys smoothies, avoids fried foods, limited red meat if any   Caffeine: coffee 1 cup in the morning-sometimes will have 1 in evening    Water: 3-4 bottles daily       Wears seat belt   Smoke detectors in home   Does not use phone while driving            Social Determinants of Health   Financial Resource Strain: Low Risk    Difficulty of Paying Living Expenses: Not very hard  Food Insecurity: No Food Insecurity   Worried About Charity fundraiser in the Last Year: Never true   [place] in  the Last Year: Never true  Transportation Needs: No Transportation Needs   Lack of Transportation (Medical): No   Lack of Transportation (Non-Medical): No  Physical Activity: Insufficiently Active   Days of Exercise per Week: 4 days   Minutes of Exercise per Session: 20 min  Stress: No Stress Concern Present   Feeling of Stress : Not at all  Social Connections: Unknown   Frequency of Communication with Friends and Family: Three times a week   Frequency of Social Gatherings with Friends and Family: Three times a week   Attends Religious Services: More than 4 times per year   Active Member of Clubs or Organizations: Yes   Attends Archivist Meetings: 1 to 4 times per year   Marital Status: Not on file    Tobacco Counseling Counseling given: Not Answered   Clinical Intake:     Pain : No/denies pain Pain Score: 0-No pain     Nutritional Status: BMI > 30  Obese Nutritional Risks: Unintentional weight gain Diabetes: Yes CBG done?: No Did pt. bring in CBG monitor from home?: No  How often do you need to have someone help you when you read instructions, pamphlets, or other written materials from your doctor or pharmacy?: 1 - Never What is the last grade level you completed in school?: 12th grade  Diabetic?yes Nutrition Risk Assessment:  Has the patient had any N/V/D within the last 2 months?  No  Does the patient have any non-healing wounds?  No  Has the patient had any unintentional weight loss or weight gain?  Yes   Diabetes:  Is the patient diabetic?  Yes  If diabetic, was a CBG obtained today?  No  Did the patient bring in their glucometer from home?   Tele visit How often do you monitor your CBG's? Haven't been checking her blood sugars.   Financial Strains and Diabetes Management:  Are you having any financial strains  with the device, your supplies or your medication? No .  Does the patient want to be seen by Chronic Care Management for management of  their diabetes?  No  Would the patient like to be referred to a Nutritionist or for Diabetic Management?  No   Diabetic Exams: Diabetic Eye Exam: Not completed, pt is educated to complete her annual diabetic eyes exam. Pt states that she is having cataract surgery on 11/06/2021 Diabetic Foot Exam: Overdue, Pt has been advised about the importance in completing this exam. Pt is scheduled for diabetic foot exam on her next office visit.   Interpreter Needed?: No      Activities of Daily Living In your present state of health, do you have any difficulty performing the following activities: 10/20/2021  Hearing? N  Vision? N  Comment reading glasses  Difficulty concentrating or making decisions? Y  Comment lately  Walking or climbing stairs? N  Dressing or bathing? Y  Doing errands, shopping? N  Preparing Food and eating ? N  Using the Toilet? N  In the past six months, have you accidently leaked urine? Y  Comment pt wears pullups  Do you have problems with loss of bowel control? Y  Comment started in the last 47months  Managing your Medications? N  Managing your Finances? Y  Comment recently  Housekeeping or managing your Housekeeping? Y  Comment due to frequest fatigue  Some recent data might be hidden    Patient Care Team: Renee Rival, FNP as PCP - General (Nurse Practitioner) Satira Sark, MD as PCP - Cardiology (Cardiology) Gala Romney Cristopher Estimable, MD as Consulting Physician (Gastroenterology)  Indicate any recent Medical Services you may have received from other than [facility] providers in the past year (date may be approximate).     Assessment:   This is a routine wellness examination for [place].  Hearing/Vision screen No results found.  Dietary issues and exercise activities discussed: Current Exercise Habits: Home exercise routine, Type of exercise: Other - see comments (biking on a stationary bike), Time (Minutes): 15, Frequency (Times/Week): 3, Weekly Exercise  (Minutes/Week): 45, Intensity: Mild, Exercise limited by: None identified   Goals Addressed             This Visit's Progress    Patient Stated   Not on track    Get pneumonia vaccine     Prevent falls   Not on track      Depression Screen PHQ 2/9 Scores 10/20/2021 10/20/2021 09/22/2021 08/20/2021 08/10/2021 02/06/2021 01/02/2021  PHQ - 2 Score 0 1 0 0 0 0 1  PHQ- 9 Score - - - - - 3 4    Fall Risk Fall Risk  10/20/2021 09/22/2021 08/20/2021 08/10/2021 02/06/2021  Falls in the past year? 1 1 0 0 1  Comment 08/2021 - - - -  Number falls in past yr: 1 1 0 0 1  Injury with Fall? 0 1 0 0 0  Comment - bruised chest - - -  Risk for fall due to : History of fall(s);Impaired balance/gait History of fall(s) No Fall Risks No Fall Risks Impaired balance/gait  Follow up Education provided Falls evaluation completed Falls evaluation completed Falls evaluation completed Falls evaluation completed    [place]:  Any stairs in or around the home? Yes  If so, are there any without handrails? No  Home free of loose throw rugs in walkways, pet beds, electrical cords, etc? Yes  Adequate lighting in  your home to reduce risk of falls? Yes   ASSISTIVE DEVICES UTILIZED TO PREVENT FALLS:  Life alert? No  Use of a cane, walker or w/c?  Purchased a cane that she uses when she travels Grab bars in the bathroom? No  Shower chair or bench in shower? No  Elevated toilet seat or a handicapped toilet? No    Cognitive Function: MMSE - Mini Mental State Exam 07/23/2020  Orientation to time 4  Orientation to Place 5  Registration 3  Attention/ Calculation 5  Recall 3  Language- name 2 objects 2  Language- repeat 1  Language- follow 3 step command 3  Language- read & follow direction 1  Write a sentence 1  Copy design 1  Total score 29     6CIT Screen 10/20/2021  What Year? 0 points  What month? 0 points  What time? 0 points    Immunizations Immunization  History  Administered Date(s) Administered   Influenza,inj,Quad PF,6+ Mos 04/16/2020   Influenza-Unspecified 06/19/2019, 07/08/2021   Moderna SARS-COV2 Booster Vaccination 07/08/2021   Moderna Sars-Covid-2 Vaccination 11/02/2019, 12/05/2019    TDAP status: Due, Education has been provided regarding the importance of this vaccine. Advised may receive this vaccine at local pharmacy or Health Dept. Aware to provide a copy of the vaccination record if obtained from local pharmacy or Health Dept. Verbalized acceptance and understanding.  Flu Vaccine status: Up to date  Pneumococcal vaccine status: Due, Education has been provided regarding the importance of this vaccine. Advised may receive this vaccine at local pharmacy or Health Dept. Aware to provide a copy of the vaccination record if obtained from local pharmacy or Health Dept. Verbalized acceptance and understanding.  Covid-19 vaccine status: Completed vaccines  Qualifies for Shingles Vaccine? Yes   Zostavax completed  plans to get it done today at [facility] [address]   Shingrix Completed?: No.    Education has been provided regarding the importance of this vaccine. Patient has been advised to call insurance company to determine out of pocket expense if they have not yet received this vaccine. Advised may also receive vaccine at local pharmacy or Health Dept. Verbalized acceptance and understanding.  Screening Tests Health Maintenance  Topic Date Due   TETANUS/TDAP  Never done   Zoster Vaccines- Shingrix (1 of 2) Never done   Pneumonia Vaccine 62+ Years old (1 - PCV) Never done   COVID-19 Vaccine (3 - Moderna risk series) 08/05/2021   MAMMOGRAM  05/01/2022   COLONOSCOPY (Pts 45-75yrs Insurance coverage will need to be confirmed)  01/31/2030   INFLUENZA VACCINE  Completed   DEXA SCAN  Completed   Hepatitis C Screening  Completed   HPV VACCINES  Aged Out    Health Maintenance  Health Maintenance Due  Topic Date Due   TETANUS/TDAP   Never done   Zoster Vaccines- Shingrix (1 of 2) Never done   Pneumonia Vaccine 38+ Years old (1 - PCV) Never done   COVID-19 Vaccine (3 - Moderna risk series) 08/05/2021    Colorectal cancer screening: Type of screening: Colonoscopy. Completed 02/01/2020. Repeat every 10 years  Mammogram status: Completed 05/01/2020. Repeat every year  Bone Density status: Completed 05/01/2020. Results reflect: Bone density results: OSTEOPENIA. Repeat every 2 years.  Lung Cancer Screening: (Low Dose CT Chest recommended if Age 56-80 years, 30 pack-year currently smoking OR have quit w/in 15years.) does not qualify.   Lung Cancer Screening Referral: n/a  Additional Screening:  Hepatitis C Screening: does qualify; Completed  01/23/2015  Vision Screening: Recommended annual ophthalmology exams for early detection of glaucoma and other disorders of the eye. Is the patient up to date with their annual eye exam?   not yet, pt is scheduled for cataract surgery of her left eye on 11/06/2021. Who is the provider or what is the name of the office in which the patient attends annual eye exams? Before COVID patient states that her eyes exam was performed in [place].  If pt is not established with a provider, would they like to be referred to a provider to establish care?  N/A .   Dental Screening: Recommended annual dental exams for proper oral hygiene  Community Resource Referral / Chronic Care Management: CRR required this visit?  Yes   CCM required this visit?  No      Plan:     I have personally reviewed and noted the following in the patients chart:   Medical and social history Use of alcohol, tobacco or illicit drugs  Current medications and supplements including opioid prescriptions.  Functional ability and status Nutritional status Physical activity Advanced directives List of other physicians Hospitalizations, surgeries, and ER visits in previous 12 months Vitals Screenings to include cognitive,  depression, and falls Referrals and appointments  In addition, I have reviewed and discussed with patient certain preventive protocols, quality metrics, and best practice recommendations. A written personalized care plan for preventive services as well as general preventive health recommendations were provided to patient.     Kathlyn Sacramento, RN   10/20/2021   Nurse Notes:   Ms. Wenberg , Thank you for taking time to come for your Medicare Wellness Visit. I appreciate your ongoing commitment to your health goals. Please review the following plan we discussed and let me know if I can assist you in the future.   These are the goals we discussed:  Goals      Patient Stated     Get pneumonia vaccine     Prevent falls        This is a list of the screening recommended for you and due dates:  Health Maintenance  Topic Date Due   Tetanus Vaccine  Never done   Zoster (Shingles) Vaccine (1 of 2) Never done   Pneumonia Vaccine (1 - PCV) Never done   COVID-19 Vaccine (3 - Moderna risk series) 08/05/2021   Mammogram  05/01/2022   Colon Cancer Screening  01/31/2030   Flu Shot  Completed   DEXA scan (bone density measurement)  Completed   Hepatitis C Screening: USPSTF Recommendation to screen - Ages 18-79 yo.  Completed   HPV Vaccine  Aged Out

## 2021-10-21 ENCOUNTER — Other Ambulatory Visit: Payer: Self-pay | Admitting: *Deleted

## 2021-10-21 DIAGNOSIS — F322 Major depressive disorder, single episode, severe without psychotic features: Secondary | ICD-10-CM

## 2021-10-21 DIAGNOSIS — Z638 Other specified problems related to primary support group: Secondary | ICD-10-CM

## 2021-10-22 ENCOUNTER — Ambulatory Visit (INDEPENDENT_AMBULATORY_CARE_PROVIDER_SITE_OTHER): Payer: Medicare PPO | Admitting: Family Medicine

## 2021-10-22 ENCOUNTER — Other Ambulatory Visit: Payer: Self-pay

## 2021-10-22 ENCOUNTER — Ambulatory Visit (HOSPITAL_COMMUNITY): Payer: Medicare PPO | Attending: Family Medicine | Admitting: Speech Pathology

## 2021-10-22 ENCOUNTER — Encounter (HOSPITAL_COMMUNITY): Payer: Self-pay | Admitting: Speech Pathology

## 2021-10-22 VITALS — BP 150/88 | HR 85 | Wt 165.8 lb

## 2021-10-22 DIAGNOSIS — R41841 Cognitive communication deficit: Secondary | ICD-10-CM | POA: Insufficient documentation

## 2021-10-22 DIAGNOSIS — S060X0D Concussion without loss of consciousness, subsequent encounter: Secondary | ICD-10-CM

## 2021-10-22 DIAGNOSIS — R42 Dizziness and giddiness: Secondary | ICD-10-CM

## 2021-10-22 DIAGNOSIS — R4189 Other symptoms and signs involving cognitive functions and awareness: Secondary | ICD-10-CM

## 2021-10-22 DIAGNOSIS — R2689 Other abnormalities of gait and mobility: Secondary | ICD-10-CM | POA: Insufficient documentation

## 2021-10-22 DIAGNOSIS — R519 Headache, unspecified: Secondary | ICD-10-CM

## 2021-10-22 DIAGNOSIS — F322 Major depressive disorder, single episode, severe without psychotic features: Secondary | ICD-10-CM | POA: Diagnosis not present

## 2021-10-22 DIAGNOSIS — R413 Other amnesia: Secondary | ICD-10-CM | POA: Diagnosis not present

## 2021-10-22 MED ORDER — FLUOXETINE HCL 10 MG PO CAPS: 10.0000 mg | ORAL_CAPSULE | Freq: Every day | ORAL | 1 refills | Status: DC

## 2021-10-22 NOTE — Patient Instructions (Addendum)
Thank you for coming in today.  ? ?Start Prozac ? ?Keep up with vestibular physical therapy ? ?OK to wear sunglasses indoors and ear protectors ? ?Recheck back in 3 weeks ?

## 2021-10-22 NOTE — Progress Notes (Signed)
Subjective:   ?I, Peterson Lombard, LAT, ATC acting as a scribe for Nicole Leader, MD. ? ?Chief Complaint: ?[name],  is a 76 y.o. female who presents for f/u concussion w/o LOC, that occurred on 08/18/21 when she hit the L side of her head on a display at [place] while bending over to pick something up.  She was seen at the [facility] ED on 08/21/21. Pt was last seen by Dr. Georgina Snell on 10/01/21 and was prescribed nortriptyline to take at bedtime and was referred to speech therapy for cognitive rehab at [facility]. Pt had her first PT visit earlier this morning, her 2nd visit. Today, pt reports the HA are completely relieved w/ the nortriptyline. Pt notes is only getting HA w/ increased brain activity. Pt c/o cont slight lightheadedness, losing her words, and slight forgetful. Pt will have days where she doesn't feel like herself, which she find very troubling and scary. ? ?Dx imaging: 08/21/21 Head CT ? ?Injury date : 08/18/21 ?Visit #: 2 ? ?History of Present Illness:  ? ?Concussion Self-Reported Symptom Score ?Symptoms rated on a scale 1-6, in last 24 hours ? ? Headache: 0   ? Nausea: 0 ? Dizziness: 5 ? Vomiting: 0 ? Balance Difficulty: 5  ? Trouble Falling Asleep: 3  ? Fatigue: 3 ? Sleep Less Than Usual: 3 ? Daytime Drowsiness: 0 ? Sleep More Than Usual: 4 ? Photophobia: 4 ? Phonophobia: 4 ? Irritability: 5 ? Sadness: 2 ? Numbness or Tingling: 0 ? Nervousness: 3 ? Feeling More Emotional: 6 ? Feeling Mentally Foggy: 5 ? Feeling Slowed Down: 5 ? Memory Problems: 6 ? Difficulty Concentrating: 6 ? Visual Problems: 6 ? ?Total # of Symptoms:  17/22 ?Total Symptom Score: 75/132 ? ?Previous Total # of Symptoms: 18/22 ?Previous Symptom Score: 82/132 ? ?Neck Pain: Yes- but only intermittently ?Tinnitus: No ? ?Review of Systems: ?No fevers or chills ? ? ?Review of History: ?History of depression.  Previously did take Paxil but got irritable on it ? ?Objective:   ? ?Physical Examination ?Vitals:  ? 10/22/21 1433  ?BP:  (!) 150/88  ?Pulse: 85  ?SpO2: 96%  ? ?MSK: Normal cervical motion ?Neuro: Alert and oriented normal gait ?Psych: Normal speech thought process and affect.  Expresses anxiety and depression symptoms. ? ? ? ?Assessment and Plan  ? ?76 y.o. female with concussion. ? ?Symptoms in multiple domains. ? ?Headache: Improved with nortriptyline.  Continue nortriptyline. ? ?Cognitive: Still a significant issue.  Started cognitive rehab.  Plan to continue cognitive rehab for now. ? ?Vestibular: Significant issue.  We will start vestibular therapy with physical therapy. ? ?Mood: Not well controlled.  History of depression previously on Paxil.  She did not do as well with Paxil as I would like.  We will start Prozac and check back in 3 weeks. ? ?Photophobia and phonophobia.  Recommend sunglasses and hearing protection. ? ?Recheck 3 weeks. ? ? ?  ?Action/Discussion: ?Reviewed diagnosis, management options, expected outcomes, and the reasons for scheduled and emergent follow-up. Questions were adequately answered. Patient expressed verbal understanding and agreement with the following plan.    ? ?Patient Education: ?Reviewed with patient the risks (i.e, a repeat concussion, post-concussion syndrome, second-impact syndrome) of returning to play prior to complete resolution, and thoroughly reviewed the signs and symptoms of concussion.Reviewed need for complete resolution of all symptoms, with rest AND exertion, prior to return to play. ?Reviewed red flags for urgent medical evaluation: worsening symptoms, nausea/vomiting, intractable headache, musculoskeletal changes, focal neurological  deficits. ?[facility] Concussion Care Plan, which clearly outlines the plans stated above, was given to patient. ? ? ?Level of service: Total encounter time 30 minutes including face-to-face time with the patient and, reviewing past medical record, and charting on the date of service.   ? ? ? ? ? ?After Visit Summary printed out and  provided to patient as appropriate. ? ?The above documentation has been reviewed and is accurate and complete Nicole Horn  ? ?

## 2021-10-22 NOTE — Therapy (Signed)
[place], [place], [address] Phone: [phone]   Fax:  [phone]  Speech Language Pathology Treatment  Patient Details  Name: Nicole Horn MRN: [id number] Date of Birth: 1945/12/30 Referring Provider (SLP): [name], MD   Encounter Date: 10/22/2021   End of Session - 10/22/21 1010     Visit Number 2    Number of Visits 9    Date for SLP Re-Evaluation 11/19/21    Authorization Type [employer] Medicare   [employer] mcr ppo eff 08/23/2021-current  $40.00 co-pay  no ded  oop max $8,300.00/ $90.00 met  no visit limit  no co-ins  auth req via cohere   Authorization Time Period CoHere approved ST 8 visits 10/19/2021-11/27/2021  [id number]    SLP Start Time 0952    SLP Stop Time  1032    SLP Time Calculation (min) 40 min    Activity Tolerance Patient tolerated treatment well             Past Medical History:  Diagnosis Date   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   Type 2 diabetes mellitus ([place])     Past Surgical History:  Procedure Laterality Date   ABDOMINAL HYSTERECTOMY     BACK SURGERY     BARTHOLIN GLAND CYST EXCISION Left 03/04/2015   Procedure: EXCISION OF LEFT BARTHOLINS TUMOR;  Surgeon: [name], MD;  Location: [facility];  Service: Gynecology;  Laterality: Left;   CATARACT EXTRACTION W/PHACO Right 03/27/2021   Procedure: CATARACT EXTRACTION PHACO AND INTRAOCULAR LENS PLACEMENT (IOC);  Surgeon:  Baruch Goldmann, MD;  Location: [facility];  Service: Ophthalmology;  Laterality: Right;  cde 8.45   CERVICAL SPINE SURGERY     COLONOSCOPY N/A 02/01/2020   Procedure: COLONOSCOPY;  Surgeon: Daneil Dolin, MD; Scattered medium mouth diverticula in the sigmoid and descending colon, otherwise normal exam.   CYST REMOVAL TRUNK     ESOPHAGOGASTRODUODENOSCOPY (EGD) WITH PROPOFOL N/A 03/27/2020   Procedure: ESOPHAGOGASTRODUODENOSCOPY (EGD) WITH PROPOFOL;  Surgeon: Daneil Dolin, MD;  [facility]: [facility];  Service: Endoscopy;  Laterality: N/A;  1:00pm   GIVENS CAPSULE STUDY N/A 03/27/2020   Procedure: GIVENS CAPSULE STUDY;  Surgeon: Daneil Dolin, MD;  Location: [facility];  Service: Endoscopy;  Laterality: N/A;   Multiple cyst removal surgeries     Stent of lower extremities      There were no vitals filed for this visit.   Subjective Assessment - 10/22/21 0958     Subjective "I have so many tablets now to help me remember."    Currently in Pain? No/denies              ADULT SLP TREATMENT - 10/22/21 1006       General Information   Behavior/Cognition Alert;Cooperative;Pleasant mood    Patient Positioning Upright in chair    Oral care provided N/A    HPI Nicole  Horn,  is a 76 y.o. female who presents for evaluation of a head injury that occurred on 08/18/21 when she hit the L side of her head on a display at [place] while bending over to pick something up.  She was seen at the [facility] ED on 08/21/21 w/ c/o con't HA and was later seen by her PCP on 09/22/21 w/ the same c/o. Pt was referred for cognitive rehab by Dr. [name] due to post concussion symptoms of impaired attention, memory, and word finding. Head CT completed on 08/21/21 was unremarkable. She recently started taking nortriptyline, which has helped her headaches and sleep.      Treatment Provided   Treatment provided Cognitive-Linquistic      Pain Assessment   Pain Assessment No/denies pain       Cognitive-Linquistic Treatment   Treatment focused on Cognition;Aphasia;Patient/family/caregiver education    Skilled Treatment SLP provided Pt with goals for treatment, review of her current planning/memory system, introduction of attention and memory strategies, cues for self evaluation, feedback, and suggestions for implementations at home. Pt also given word finding strategies.      Assessment / Recommendations / Plan   Plan Continue with current plan of care      Progression Toward Goals   Progression toward goals Progressing toward goals              SLP Education - 10/22/21 1307     Education Details Provided with self evaluation forms and memory/organization templates    Person(s) Educated Patient    Methods Explanation;Handout    Comprehension Verbalized understanding;Need further instruction              SLP Short Term Goals - 10/22/21 1011       SLP SHORT TERM GOAL #1   Title Pt will complete selective and alternating attention tasks (moderately complex) with 90% acc and min cues.    Baseline ~80%    Time 4    Status On-going    Target Date 11/26/21      SLP SHORT TERM GOAL #2   Title Pt will increase recall for requested information to 95% acc during functional memory exercises with use of compensatory strategies as needed when provided min cues.    Baseline 75%    Time 4    Period Weeks    Status On-going    Target Date 11/26/21      SLP SHORT TERM GOAL #3   Title Pt will utilize external memory strategies in home environment by recording 3 items daily in planner, notebook, daily memory writing task daily for 5/7 days    Baseline Pt has system but is still paying bills late and missing appointments    Time 4    Period Weeks    Status On-going    Target Date 11/26/21      SLP SHORT TERM GOAL #4   Title Pt will plan and execute hypothetical problem solving tasks that involve multitasking with 95% acc with min assist from SLP for strategies.     Baseline mod assist    Time 4    Period Weeks    Status On-going    Target Date 11/26/21      SLP SHORT TERM GOAL #5   Title Pt will verbally express complex thoughts with use of word finding strategies as needed when describing previous events/activities, "how to" scenarios, and future plans with 90% effectiveness as judged by SLP and Pt through self-evaluation and audio recording  when provided min cues.    Baseline circumlocution, dysnomia, losing train of thought    Time 4    Status On-going    Target Date 11/26/21                Plan - 10/22/21 1011     Clinical Impression Statement Pt read through information regarding attention and memory that SLP provided during her evaluation. She identified strategies that she feels might be helpful to her. She brought all of her notebooks which include: one bills, repayment from family members, calendar, and a notebook for running lists. SLP encouraged Pt to review additional templates provided to find a system that will work best for her. She mentioned missing some bill deadlines, so SLP suggested that Pt list monthly bills and the due dates on each month of her calendar or on a sticky note. She liked the idea of using high lighters to help organize her categories. For homework, she was given templates to review and a self evaluation form in regards to changes in memory and attention. Next session, will review the templates she chooses and implement memory strategies in structured tasks.    Speech Therapy Frequency 2x / week    Duration 4 weeks    Treatment/Interventions Compensatory strategies;Patient/family education;Cueing hierarchy;SLP instruction and feedback;Compensatory techniques;Cognitive reorganization;Functional tasks    Potential to Achieve Goals Good    Potential Considerations Ability to learn/carryover information    SLP Home Exercise Plan Pt will completed HEP as assigned to facilitate carryover of treatment strategies and  techniques in home environment with use of written cues as needed.    Consulted and Agree with Plan of Care Patient             Patient will benefit from skilled therapeutic intervention in order to improve the following deficits and impairments:   Cognitive communication deficit  Memory change    Problem List Patient Active Problem List   Diagnosis Date Noted   Concussion with no loss of consciousness 09/22/2021   Persistent headaches 09/22/2021   Frequent headaches 09/22/2021   Prediabetes 09/22/2021   LFTs abnormal 02/06/2021   Herpes 01/02/2021   Palpitations 11/27/2020   Depression, major, single episode, severe ([place]) 10/22/2020   Insomnia 10/22/2020   Caregiver role strain 07/23/2020   Primary osteoarthritis of first carpometacarpal joint of right hand 07/16/2020   Annual visit for general adult medical examination with abnormal findings 04/16/2020   Postmenopausal 04/16/2020   Screening mammogram, encounter for 04/16/2020   Atypical mole 04/16/2020   Need for immunization against influenza 04/16/2020   Constipation 03/17/2020   PVD (peripheral vascular disease) ([place]) 03/14/2020   CAD (coronary artery disease) 03/14/2020   Iron deficiency anemia 03/14/2020   DOE (dyspnea on exertion) 03/10/2020   H/O adenomatous polyp of colon 01/02/2020   FH: colon cancer 01/02/2020   Heart disease 12/11/2019   Upper airway cough syndrome 11/13/2019   Overweight with body mass index (BMI) of 29 to 29.9 in adult 10/16/2019   Essential hypertension 09/10/2019   Hyperlipemia 09/10/2019   Thank you,  Genene Churn, [place]  Genene Churn, [place] 10/22/2021, 1:13 PM  [facility] [address] [place], [place], [address] Phone: [phone]   Fax:  [phone]   Name: Nicole Horn MRN: [id number] Date of Birth: 1946-01-14

## 2021-10-23 ENCOUNTER — Telehealth: Payer: Self-pay | Admitting: *Deleted

## 2021-10-23 ENCOUNTER — Other Ambulatory Visit: Payer: Self-pay | Admitting: Family Medicine

## 2021-10-23 NOTE — Chronic Care Management (AMB) (Signed)
?  Chronic Care Management  ? ?Note ? ?10/23/2021 ?Name: Nicole Horn MRN: [id number] DOB: 03-Jan-1946 ? ?[name] is a 76 y.o. year old female who is a primary care patient of Renee Rival, FNP. I reached out to [name] by phone today in response to a referral sent by [name] PCP. ? ?[name] was given information about Chronic Care Management services today including:  ?CCM service includes personalized support from designated clinical staff supervised by her physician, including individualized plan of care and coordination with other care providers ?24/7 contact phone numbers for assistance for urgent and routine care needs. ?Service will only be billed when office clinical staff spend 20 minutes or more in a month to coordinate care. ?Only one practitioner may furnish and bill the service in a calendar month. ?The patient may stop CCM services at any time (effective at the end of the month) by phone call to the office staff. ?The patient is responsible for co-pay (up to 20% after annual deductible is met) if co-pay is required by the individual health plan.  ? ?Patient agreed to services and verbal consent obtained.  ? ?Follow up plan: ?Telephone appointment with care management team member scheduled for:12/11/21 ? ?Laverda Sorenson  ?Care Guide, Embedded Care Coordination ?[place]  Care Management  ?Direct Dial: [phone] ? ?

## 2021-10-27 ENCOUNTER — Encounter (HOSPITAL_COMMUNITY): Payer: Self-pay | Admitting: Speech Pathology

## 2021-10-27 ENCOUNTER — Other Ambulatory Visit: Payer: Self-pay

## 2021-10-27 ENCOUNTER — Ambulatory Visit (HOSPITAL_COMMUNITY): Payer: Medicare PPO | Admitting: Speech Pathology

## 2021-10-27 ENCOUNTER — Ambulatory Visit (HOSPITAL_COMMUNITY): Payer: Medicare PPO

## 2021-10-27 DIAGNOSIS — R2689 Other abnormalities of gait and mobility: Secondary | ICD-10-CM | POA: Diagnosis not present

## 2021-10-27 DIAGNOSIS — R41841 Cognitive communication deficit: Secondary | ICD-10-CM

## 2021-10-27 DIAGNOSIS — R42 Dizziness and giddiness: Secondary | ICD-10-CM | POA: Diagnosis not present

## 2021-10-27 DIAGNOSIS — R413 Other amnesia: Secondary | ICD-10-CM | POA: Diagnosis not present

## 2021-10-27 NOTE — Therapy (Signed)
[place], [place], [address] Phone: [phone]   Fax:  [phone]  Speech Language Pathology Treatment  Patient Details  Name: Nicole Horn MRN: [id number] Date of Birth: [birth year] Referring Provider (SLP): [name], MD   Encounter Date: 10/27/2021   End of Session - 10/27/21 1447     Visit Number 3    Number of Visits 9    Date for SLP Re-Evaluation 11/19/21    Authorization Type [employer] Medicare   [employer] mcr ppo eff 08/23/2021-current  $40.00 co-pay  no ded  oop max $8,300.00/ $90.00 met  no visit limit  no co-ins  auth req via cohere   Authorization Time Period CoHere approved ST 8 visits 10/19/2021-11/27/2021  PYPP#[id number]    SLP Start Time 4580    SLP Stop Time  1515    SLP Time Calculation (min) 38 min    Activity Tolerance Patient tolerated treatment well             Past Medical History:  Diagnosis Date   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   Type 2 diabetes mellitus ([place])     Past Surgical History:  Procedure Laterality Date   ABDOMINAL HYSTERECTOMY     BACK SURGERY     BARTHOLIN GLAND CYST EXCISION Left 03/04/2015   Procedure: EXCISION OF LEFT BARTHOLINS TUMOR;  Surgeon: [name], MD;  Location: [facility];  Service: Gynecology;  Laterality: Left;   CATARACT EXTRACTION W/PHACO Right 03/27/2021   Procedure: CATARACT EXTRACTION PHACO AND INTRAOCULAR LENS PLACEMENT (IOC);  Surgeon:  Baruch Goldmann, MD;  Location: [facility];  Service: Ophthalmology;  Laterality: Right;  cde 8.45   CERVICAL SPINE SURGERY     COLONOSCOPY N/A 02/01/2020   Procedure: COLONOSCOPY;  Surgeon: Daneil Dolin, MD; Scattered medium mouth diverticula in the sigmoid and descending colon, otherwise normal exam.   CYST REMOVAL TRUNK     ESOPHAGOGASTRODUODENOSCOPY (EGD) WITH PROPOFOL N/A 03/27/2020   Procedure: ESOPHAGOGASTRODUODENOSCOPY (EGD) WITH PROPOFOL;  Surgeon: Daneil Dolin, MD;  [facility];  Service: Endoscopy;  Laterality: N/A;  1:00pm   GIVENS CAPSULE STUDY N/A 03/27/2020   Procedure: GIVENS CAPSULE STUDY;  Surgeon: Daneil Dolin, MD;  [facility];  Service: Endoscopy;  Laterality: N/A;   Multiple cyst removal surgeries     Stent of lower extremities      There were no vitals filed for this visit.   Subjective Assessment - 10/27/21 1440     Subjective "He gave me some new pills and I like them."    Currently in Pain? No/denies               ADULT SLP TREATMENT - 10/27/21 1444       General Information   Behavior/Cognition Alert;Cooperative;Pleasant mood    Patient Positioning Upright in chair    Oral care provided N/A  HPI Nicole Horn,  is a 76 y.o. female who presents for evaluation of a head injury that occurred on 08/18/21 when she hit the L side of her head on a display at [place] while bending over to pick something up.  She was seen at the [facility] ED on 08/21/21 w/ c/o con't HA and was later seen by her PCP on 09/22/21 w/ the same c/o. Pt was referred for cognitive rehab by Dr. [name] due to post concussion symptoms of impaired attention, memory, and word finding. Head CT completed on 08/21/21 was unremarkable. She recently started taking nortriptyline, which has helped her headaches and sleep.      Treatment Provided   Treatment provided Cognitive-Linquistic      Pain Assessment   Pain Assessment No/denies pain       Cognitive-Linquistic Treatment   Treatment focused on Cognition;Aphasia;Patient/family/caregiver education    Skilled Treatment SLP provided Pt with goals for treatment, review of her current planning/memory system, introduction of attention and memory strategies, cues for self evaluation, feedback, and suggestions for implementations at home. Pt also given word finding strategies.      Assessment / Recommendations / Plan   Plan Continue with current plan of care      Progression Toward Goals   Progression toward goals Progressing toward goals                SLP Short Term Goals - 10/27/21 1456       SLP SHORT TERM GOAL #1   Title Pt will complete selective and alternating attention tasks (moderately complex) with 90% acc and min cues.    Baseline ~80%    Time 4    Status On-going    Target Date 11/26/21      SLP SHORT TERM GOAL #2   Title Pt will increase recall for requested information to 95% acc during functional memory exercises with use of compensatory strategies as needed when provided min cues.    Baseline 75%    Time 4    Period Weeks    Status On-going    Target Date 11/26/21      SLP SHORT TERM GOAL #3   Title Pt will utilize external memory strategies in home environment by recording 3 items daily in planner, notebook, daily memory writing task daily for 5/7 days    Baseline Pt has system but is still paying bills late and missing appointments    Time 4    Period Weeks    Status On-going    Target Date 11/26/21      SLP SHORT TERM GOAL #4   Title Pt will plan and execute hypothetical problem solving tasks that involve multitasking with 95% acc with min assist from SLP for strategies.    Baseline mod assist    Time 4    Period Weeks    Status On-going    Target Date 11/26/21      SLP SHORT TERM GOAL #5   Title Pt will verbally express complex thoughts with use of word finding strategies as needed when describing previous events/activities, "how to"  scenarios, and future plans with 90% effectiveness as judged by SLP and Pt through self-evaluation and audio recording when provided min cues.    Baseline circumlocution, dysnomia, losing train of thought    Time 4    Status On-going    Target Date 11/26/21                Plan -  10/27/21 1455     Clinical Impression Statement Pt forgot to bring her folder with the memory/attention self evaluation forms in it to therapy today. She had a follow up appointment with Dr. Georgina Snell last week and stated that everything went well. He changed her medications slightly. In session, she completed confrontation naming task with low frequency words with 100% acc with min assist. SLP cued Pt to provide descriptions of words when unable to name. She completed convergent naming task with 100% acc with min cues and synonyms with 100% acc and min cues. Cognitive strategies for home tasks were reviewed and Pt reports feeling "scattered" and more "anxious" since hitting her head. She relieves stress by spending some quiet time in her room alone. She was reminded to bring her folder tomorrow to discuss her self evaluations.   Speech Therapy Frequency 2x / week    Duration 4 weeks    Treatment/Interventions Compensatory strategies;Patient/family education;Cueing hierarchy;SLP instruction and feedback;Compensatory techniques;Cognitive reorganization;Functional tasks    Potential to Achieve Goals Good    Potential Considerations Ability to learn/carryover information    SLP Home Exercise Plan Pt will completed HEP as assigned to facilitate carryover of treatment strategies and techniques in home environment with use of written cues as needed.    Consulted and Agree with Plan of Care Patient             Patient will benefit from skilled therapeutic intervention in order to improve the following deficits and impairments:   Cognitive communication deficit  Memory change    Problem List Patient Active Problem  List   Diagnosis Date Noted   Concussion with no loss of consciousness 09/22/2021   Persistent headaches 09/22/2021   Frequent headaches 09/22/2021   Prediabetes 09/22/2021   LFTs abnormal 02/06/2021   Herpes 01/02/2021   Palpitations 11/27/2020   Depression, major, single episode, severe ([place]) 10/22/2020   Insomnia 10/22/2020   Caregiver role strain 07/23/2020   Primary osteoarthritis of first carpometacarpal joint of right hand 07/16/2020   Annual visit for general adult medical examination with abnormal findings 04/16/2020   Postmenopausal 04/16/2020   Screening mammogram, encounter for 04/16/2020   Atypical mole 04/16/2020   Need for immunization against influenza 04/16/2020   Constipation 03/17/2020   PVD (peripheral vascular disease) ([place]) 03/14/2020   CAD (coronary artery disease) 03/14/2020   Iron deficiency anemia 03/14/2020   DOE (dyspnea on exertion) 03/10/2020   H/O adenomatous polyp of colon 01/02/2020   FH: colon cancer 01/02/2020   Heart disease 12/11/2019   Upper airway cough syndrome 11/13/2019   Overweight with body mass index (BMI) of 29 to 29.9 in adult 10/16/2019   Essential hypertension 09/10/2019   Hyperlipemia 09/10/2019   Thank you,  Genene Churn, [place]  Genene Churn, [place] 10/27/2021, 3:39 PM  [place], [place], [address] Phone: [phone]   Fax:  [phone]   Name: Nicole Horn MRN: [id number] Date of Birth: [birth year]

## 2021-10-28 ENCOUNTER — Ambulatory Visit (HOSPITAL_COMMUNITY): Payer: Medicare PPO | Admitting: Speech Pathology

## 2021-10-28 ENCOUNTER — Encounter (HOSPITAL_COMMUNITY): Payer: Self-pay | Admitting: Speech Pathology

## 2021-10-28 DIAGNOSIS — R42 Dizziness and giddiness: Secondary | ICD-10-CM | POA: Diagnosis not present

## 2021-10-28 DIAGNOSIS — R41841 Cognitive communication deficit: Secondary | ICD-10-CM

## 2021-10-28 DIAGNOSIS — R413 Other amnesia: Secondary | ICD-10-CM | POA: Diagnosis not present

## 2021-10-28 DIAGNOSIS — R2689 Other abnormalities of gait and mobility: Secondary | ICD-10-CM | POA: Diagnosis not present

## 2021-10-28 NOTE — Progress Notes (Signed)
?   10/27/21 0757  ?PT Visits / Re-Eval  ?Visit Number 1  ?Number of Visits 8  ?Date for PT Re-Evaluation 11/24/21  ?Authorization  ?Authorization Type [employer]- no co insurance, co-pay 61, Ded 350, no VL  ?PT Time Calculation  ?PT Start Time 1512  ?PT Stop Time 1600  ?PT Time Calculation (min) 48 min  ?PT - End of Session  ?Activity Tolerance Patient tolerated treatment well  ?Behavior During Therapy [facility] for tasks assessed/performed  ? ? ?

## 2021-10-28 NOTE — Therapy (Signed)
Nicole Horn, [place], [address] Phone: [phone]   Fax:  [phone]  Speech Language Pathology Treatment  Patient Details  Name: Nicole Horn MRN: [id number] Date of Birth: [birth year] Referring Provider (SLP): [name], MD   Encounter Date: 10/28/2021   End of Session - 10/28/21 1149     Visit Number 4    Number of Visits 9    Date for SLP Re-Evaluation 11/19/21    Authorization Type [employer] Medicare   [employer] mcr ppo eff 08/23/2021-current  $40.00 co-pay  no ded  oop max $8,300.00/ $90.00 met  no visit limit  no co-ins  auth req via cohere   Authorization Time Period CoHere approved ST 8 visits 10/19/2021-11/27/2021  SWFU#[id number]    SLP Start Time 2025    SLP Stop Time  1122    SLP Time Calculation (min) 42 min    Activity Tolerance Patient tolerated treatment well             Past Medical History:  Diagnosis Date   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   Type 2 diabetes mellitus ([place])     Past Surgical History:  Procedure Laterality Date   ABDOMINAL HYSTERECTOMY     BACK SURGERY     BARTHOLIN GLAND CYST EXCISION Left 03/04/2015   Procedure: EXCISION OF LEFT BARTHOLINS TUMOR;  Surgeon: [name], MD;  Location: [facility];  Service: Gynecology;  Laterality: Left;   CATARACT EXTRACTION W/PHACO Right 03/27/2021   Procedure: CATARACT EXTRACTION PHACO AND INTRAOCULAR LENS PLACEMENT (IOC);  Surgeon:  Baruch Goldmann, MD;  Location: [facility];  Service: Ophthalmology;  Laterality: Right;  cde 8.45   CERVICAL SPINE SURGERY     COLONOSCOPY N/A 02/01/2020   Procedure: COLONOSCOPY;  Surgeon: Daneil Dolin, MD; Scattered medium mouth diverticula in the sigmoid and descending colon, otherwise normal exam.   CYST REMOVAL TRUNK     ESOPHAGOGASTRODUODENOSCOPY (EGD) WITH PROPOFOL N/A 03/27/2020   Procedure: ESOPHAGOGASTRODUODENOSCOPY (EGD) WITH PROPOFOL;  Surgeon: Daneil Dolin, MD;  Location: [facility];  Service: Endoscopy;  Laterality: N/A;  1:00pm   GIVENS CAPSULE STUDY N/A 03/27/2020   Procedure: GIVENS CAPSULE STUDY;  Surgeon: Daneil Dolin, MD;  Location: [facility];  Service: Endoscopy;  Laterality: N/A;   Multiple cyst removal surgeries     Stent of lower extremities      There were no vitals filed for this visit.   Subjective Assessment - 10/28/21 1142     Subjective "I was going to bed at 4 AM."    Currently in Pain? No/denies                   ADULT SLP TREATMENT - 10/28/21 1143       General Information   Behavior/Cognition Alert;Cooperative;Pleasant mood    Patient Positioning Upright in chair    Oral care provided N/A  HPI Nicole Horn,  is a 76 y.o. female who presents for evaluation of a head injury that occurred on 08/18/21 when she hit the L side of her head on a display at [place] while bending over to pick something up.  She was seen at the [facility] ED on 08/21/21 w/ c/o con't HA and was later seen by her PCP on 09/22/21 w/ the same c/o. Pt was referred for cognitive rehab by Dr. [name] due to post concussion symptoms of impaired attention, memory, and word finding. Head CT completed on 08/21/21 was unremarkable. She recently started taking nortriptyline, which has helped her headaches and sleep.      Treatment Provided   Treatment provided Cognitive-Linquistic      Pain Assessment   Pain Assessment No/denies pain       Cognitive-Linquistic Treatment   Treatment focused on Cognition;Aphasia;Patient/family/caregiver education    Skilled Treatment SLP reviewed personal attention/memory self evaluation form and identified areas of difficulty and possible solutions. Pt identified 5 "problems" and SLP offered to-do list template with plans for addressing deficits. Pt encouraged to utilize word finding strategies when unable to produce desired word.      Assessment / Recommendations / Plan   Plan Continue with current plan of care      Progression Toward Goals   Progression toward goals Progressing toward goals              SLP Education - 10/28/21 1147     Education Details Encouraged Pt to incorporate the "to do" list and follow a fairly strict routine for bedtime for a couple of weeks    Person(s) Educated Patient    Methods Explanation;Handout    Comprehension Verbalized understanding              SLP Short Term Goals - 10/28/21 1156       SLP SHORT TERM GOAL #1   Title Pt will complete selective and alternating attention tasks (moderately complex) with 90% acc and min cues.    Baseline ~80%    Time 4    Status On-going    Target Date 11/26/21      SLP SHORT TERM GOAL #2   Title Pt will increase recall for requested information to 95% acc during functional memory exercises with use of compensatory strategies as needed when provided min cues.    Baseline 75%    Time 4    Period Weeks    Status On-going    Target Date 11/26/21      SLP SHORT TERM GOAL #3   Title Pt will utilize external memory strategies in home environment by recording 3 items daily in planner, notebook, daily memory writing task daily for 5/7 days    Baseline Pt has system but is still paying bills late and missing appointments    Time 4    Period Weeks    Status On-going    Target Date 11/26/21      SLP SHORT TERM GOAL #4   Title Pt will plan and execute hypothetical problem solving tasks that involve  multitasking with 95% acc with min assist from SLP for strategies.    Baseline mod assist    Time 4    Period Weeks    Status On-going    Target Date 11/26/21      SLP SHORT TERM GOAL #5   Title Pt will verbally express complex thoughts with use of word finding strategies as needed when describing previous events/activities, "  how to" scenarios, and future plans with 90% effectiveness as judged by SLP and Pt through self-evaluation and audio recording when provided min cues.    Baseline circumlocution, dysnomia, losing train of thought    Time 4    Status On-going    Target Date 11/26/21                Plan - 10/28/21 1150     Clinical Impression Statement Pt brought her folder with completed forms this date. SLP reviewed with Pt and Pt predominantly identified with deficits in memory/attention vs communication and emtional changes. Pt identfied the following problems: initiating and completing a task, picking up after herself, taking care of her bedroom, excercising, and making important phone calls. Pt reports an inconsistent wake and bedtime (often going to bed at 4 AM). She reports that she did not previously do this (before hitting her head). As a result, she is inconsistent about taking her medications. SLP emphasized the importance of establishing strict routines for now, to aid in cognitive performance. She identifed the following strategies she feels would help her: creating check lists, using a notebook/calendar, and using the clock as a reminder. She was asked to complete a brainstorming task for homework, to write down all "to do" items no matter how big or small and we can prioritize them next session.    Speech Therapy Frequency 2x / week    Duration 4 weeks    Treatment/Interventions Compensatory strategies;Patient/family education;Cueing hierarchy;SLP instruction and feedback;Compensatory techniques;Cognitive reorganization;Functional tasks    Potential to Achieve Goals  Good    Potential Considerations Ability to learn/carryover information    SLP Home Exercise Plan Pt will completed HEP as assigned to facilitate carryover of treatment strategies and techniques in home environment with use of written cues as needed.    Consulted and Agree with Plan of Care Patient             Patient will benefit from skilled therapeutic intervention in order to improve the following deficits and impairments:   Cognitive communication deficit  Memory change    Problem List Patient Active Problem List   Diagnosis Date Noted   Concussion with no loss of consciousness 09/22/2021   Persistent headaches 09/22/2021   Frequent headaches 09/22/2021   Prediabetes 09/22/2021   LFTs abnormal 02/06/2021   Herpes 01/02/2021   Palpitations 11/27/2020   Depression, major, single episode, severe ([place]) 10/22/2020   Insomnia 10/22/2020   Caregiver role strain 07/23/2020   Primary osteoarthritis of first carpometacarpal joint of right hand 07/16/2020   Annual visit for general adult medical examination with abnormal findings 04/16/2020   Postmenopausal 04/16/2020   Screening mammogram, encounter for 04/16/2020   Atypical mole 04/16/2020   Need for immunization against influenza 04/16/2020   Constipation 03/17/2020   PVD (peripheral vascular disease) ([place]) 03/14/2020   CAD (coronary artery disease) 03/14/2020   Iron deficiency anemia 03/14/2020   DOE (dyspnea on exertion) 03/10/2020   H/O adenomatous polyp of colon 01/02/2020   FH: colon cancer 01/02/2020   Heart disease 12/11/2019   Upper airway cough syndrome 11/13/2019   Overweight with body mass index (BMI) of 29 to 29.9 in adult 10/16/2019   Essential hypertension 09/10/2019   Hyperlipemia 09/10/2019   Thank you,  Genene Churn, [place]  Genene Churn, [place] 10/28/2021, 12:02 PM  [facility] [address] [place], [place], [address] Phone:  [phone]   Fax:  [phone]   Name: Nicole Stith  Horn MRN: [id number] Date of Birth: [birth year]

## 2021-10-28 NOTE — Progress Notes (Signed)
?   10/27/21 0001  ?Assessment  ?Medical Diagnosis dizziness/ vertigo  ?Referring Provider (PT) [name] MD  ?Onset Date/Surgical Date 08/18/21  ?Hand Dominance Left  ?Next MD Visit 11/12/21  ?Prior Therapy yes ?(for legs, R thumb, cervical fusion)  ?Precautions  ?Precautions Fall  ?Precaution Comments 2 falls in the past 6 months  ?Restrictions  ?Weight Bearing Restrictions No  ?Balance Screen  ?Has the patient fallen in the past 6 months Yes  ?How many times? 2  ?Has the patient had a decrease in activity level because of a fear of falling?  Yes  ?Is the patient reluctant to leave their home because of a fear of falling?  Yes  ?Home Environment  ?Living Environment Private residence  ?Living Arrangements Alone  ?Available Help at Discharge  ?(none)  ?Type of Home Apartment  ?Home Access Stairs to enter  ?Home Layout One level  ?Prior Function  ?Level of Independence Independent  ?Vocation Retired  ?Leisure work out, likes to go gambling  ?Cognition  ?Overall Cognitive Status Impaired/Different from baseline  ?Area of Impairment Attention;Memory  ?Current Attention Level Sustained  ?Observation/Other Assessments  ?Skin Integrity good  ?Focus on Therapeutic Outcomes (FOTO)  52  ?Functional Tests  ?Functional tests Single leg stance  ?Dynamic Gait Index  ?Level Surface 2  ?Change in Gait Speed 1  ?Gait with Horizontal Head Turns 1  ?Gait with Vertical Head Turns 1  ?Gait and Pivot Turn 1  ?Step Over Obstacle 1  ?Step Around Obstacles 1  ?Steps 1  ?Total Score 9  ? ? ?

## 2021-10-28 NOTE — Therapy (Signed)
[facility] [address] [place], [place], [address] Phone: [phone]   Fax:  [phone]  Physical Therapy Evaluation  Patient Details  Name: Nicole Horn MRN: [id number] Date of Birth: 1946-02-14 Referring Provider (PT): [name] MD   Encounter Date: 10/27/2021    10/27/21 0757  PT Visits / Re-Eval  Visit Number 1  Number of Visits 8  Date for PT Re-Evaluation 11/24/21  Authorization  Authorization Type [employer]- no co insurance, co-pay 65, Ded 350, no VL  PT Time Calculation  PT Start Time 2831  PT Stop Time 1600  PT Time Calculation (min) 48 min  PT - End of Session  Activity Tolerance Patient tolerated treatment well  Behavior During Therapy [facility] for tasks assessed/performed     Past Medical History:  Diagnosis Date   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   Type 2 diabetes mellitus ([place])     Past Surgical History:  Procedure Laterality Date   ABDOMINAL HYSTERECTOMY     BACK SURGERY     BARTHOLIN GLAND CYST EXCISION Left 03/04/2015   Procedure: EXCISION OF LEFT BARTHOLINS TUMOR;  Surgeon: [name], MD;  Location: [facility];  Service: Gynecology;  Laterality: Left;   CATARACT EXTRACTION W/PHACO Right 03/27/2021   Procedure: CATARACT EXTRACTION PHACO AND INTRAOCULAR LENS PLACEMENT (IOC);  Surgeon: Baruch Goldmann, MD;  Location: [facility];  Service: Ophthalmology;  Laterality: Right;  cde 8.45   CERVICAL  SPINE SURGERY     COLONOSCOPY N/A 02/01/2020   Procedure: COLONOSCOPY;  Surgeon: Daneil Dolin, MD; Scattered medium mouth diverticula in the sigmoid and descending colon, otherwise normal exam.   CYST REMOVAL TRUNK     ESOPHAGOGASTRODUODENOSCOPY (EGD) WITH PROPOFOL N/A 03/27/2020   Procedure: ESOPHAGOGASTRODUODENOSCOPY (EGD) WITH PROPOFOL;  Surgeon: Daneil Dolin, MD;  [facility];  Service: Endoscopy;  Laterality: N/A;  1:00pm   GIVENS CAPSULE STUDY N/A 03/27/2020   Procedure: GIVENS CAPSULE STUDY;  Surgeon: Daneil Dolin, MD;  Location[facility];  Service: Endoscopy;  Laterality: N/A;   Multiple cyst removal surgeries     Stent of lower extremities      There were no vitals filed for this visit.    Subjective Assessment - 10/27/21 1515     Subjective " I get light headed alot and dizzy". She reports these symptoms have been prevenlant the last few months. She is trying to not use an AD but did purchase a cane. She can get light headed anytime; her dizzy spells are a little further apart but she did have one yesterday. can get dizzy when reading. bent down at the store a couple months ago and sustained a concussion and that has definately exacerbated her symptoms. She has a PMH of cervical fusion a couple years ago in [place].    Pertinent History takes blood pressure medication    Limitations House hold  activities;Walking;Standing;Sitting    How long can you sit comfortably? depends    How long can you stand comfortably? depends    How long can you walk comfortably? depends    Diagnostic tests CT scan showed concussion about 2 months ago    Patient Stated Goals feel like myself again; be about to go out to dinner and be able to exercise    Currently in Pain? No/denies    Pain Score 0-No pain                    10/27/21 0001  Assessment  Medical Diagnosis dizziness/ vertigo  Referring Provider (PT) [name] MD  Onset Date/Surgical Date 08/18/21   Hand Dominance Left  Next MD Visit 11/12/21  Prior Therapy yes (for legs, R thumb, cervical fusion)  Precautions  Precautions Fall  Precaution Comments 2 falls in the past 6 months  Restrictions  Weight Bearing Restrictions No  Balance Screen  Has the patient fallen in the past 6 months Yes  How many times? 2  Has the patient had a decrease in activity level because of a fear of falling?  Yes  Is the patient reluctant to leave their home because of a fear of falling?  Yes  [place] residence  Living Arrangements Alone  Available Help at Discharge  (none)  Type of [place] to enter  [place] One level  Prior Function  Level of [place] Retired  Leisure work out, likes to go gambling  Cognition  Overall Cognitive Status Impaired/Different from baseline  Area of Impairment Attention;Memory  Current Attention Level Sustained  Observation/Other Assessments  Skin Integrity good  Focus on Therapeutic Outcomes (FOTO)  52  Functional Tests  Functional tests Single leg stance  Dynamic Gait Index  Level Surface 2  Change in Gait Speed 1  Gait with Horizontal Head Turns 1  Gait with Vertical Head Turns 1  Gait and Pivot Turn 1  Step Over Obstacle 1  Step Around Obstacles 1  Steps 1  Total Score 9            Objective measurements completed on examination: See above findings.       [facility] Adult PT Treatment/Exercise - 10/28/21 0001       Exercises   Exercises Other Exercises    Other Exercises  gaze stabilization 3 x 10 still object head turns                       PT Short Term Goals - 10/27/21 0803       PT SHORT TERM GOAL #1   Title Patient will be independent in HEP to improve functional outcomes.    Time 2    Period Weeks    Status New    Target Date 11/10/21               PT Long Term Goals - 10/27/21 0804       PT LONG TERM GOAL #1    Title Patient will report at least 50% improvement in overall symptoms and/or function to demonstrate improved functional mobility    Time 4    Period Weeks    Status New    Target Date 11/24/21      PT LONG TERM GOAL #2   Title Patient will meet predicted FOTO score to demonstrate improved overall function.    Time 4  Period Weeks    Status New    Target Date 11/24/21      PT LONG TERM GOAL #3   Title Patient will improve DGI score from 9 to 12 demonstrating improved functional mobility and decreased fall risk to improve patient's ability to walk in the community.    Baseline DGI 9    Time 4    Period Weeks    Status New    Target Date 11/24/21                    Plan - 10/27/21 0758     Clinical Impression Statement Patient presents to therapy today with complaints of both lightheadness and dizziness occuring for unknown reasons with no specific pattern.  She presents with a low score on the DGI and states her complaints limit her activities both in the home and the community.  She has a PMH of HTN, CAD, DM and a cervical fusion.  She also had a fall in a store 2 months ago in which she hit her head and had a concussion and that has seemed to exacerbater her symptoms.Patient will continue to benefit from skilled therapy services to reduce deficits and improve functional level.    Personal Factors and Comorbidities Age;Comorbidity 1    Comorbidities HTN, DM, CAD    Examination-Activity Limitations Bathing;Bed Mobility;Locomotion Level;Stand;Caring for Others;Carry;Transfers;Dressing;Sleep;Sit    Examination-Participation Restrictions Church;Laundry;Cleaning;Shop;Community Activity;Meal Prep;Driving    Stability/Clinical Decision Making Evolving/Moderate complexity    Clinical Decision Making Moderate    Rehab Potential Good    PT Frequency 2x / week    PT Duration 4 weeks    PT Treatment/Interventions ADLs/Self Care Home Management;Aquatic  Therapy;Biofeedback;Canalith Repostioning;Traction;Moist Heat;Electrical Stimulation;Iontophoresis '4mg'$ /ml Dexamethasone;Functional mobility training;Stair training;Cryotherapy;Gait training;DME Instruction;Therapeutic activities;Therapeutic exercise;Balance training;Neuromuscular re-education;Orthotic Fit/Training;Patient/family education;Cognitive remediation;Wheelchair mobility training;Manual techniques;Taping;Splinting;Energy conservation;Dry needling;Passive range of motion;Scar mobilization;Vestibular;Visual/perceptual remediation/compensation;Spinal Manipulations;Joint Manipulations    PT Next Visit Plan check UE MMTs, work on dynamic standing balance    PT Home Exercise Plan gaze stabilization 3 sets of 10, stationary object with head turns.    Consulted and Agree with Plan of Care Patient             Patient will benefit from skilled therapeutic intervention in order to improve the following deficits and impairments:  Abnormal gait, Decreased balance, Decreased endurance, Decreased mobility, Difficulty walking, Dizziness, Impaired perceived functional ability, Decreased activity tolerance, Decreased coordination, Decreased safety awareness, Decreased strength  Visit Diagnosis: Dizziness and giddiness - Plan: PT plan of care cert/re-cert  Other abnormalities of gait and mobility - Plan: PT plan of care cert/re-cert     Problem List Patient Active Problem List   Diagnosis Date Noted   Concussion with no loss of consciousness 09/22/2021   Persistent headaches 09/22/2021   Frequent headaches 09/22/2021   Prediabetes 09/22/2021   LFTs abnormal 02/06/2021   Herpes 01/02/2021   Palpitations 11/27/2020   Depression, major, single episode, severe ([place]) 10/22/2020   Insomnia 10/22/2020   Caregiver role strain 07/23/2020   Primary osteoarthritis of first carpometacarpal joint of right hand 07/16/2020   Annual visit for general adult medical examination with abnormal findings  04/16/2020   Postmenopausal 04/16/2020   Screening mammogram, encounter for 04/16/2020   Atypical mole 04/16/2020   Need for immunization against influenza 04/16/2020   Constipation 03/17/2020   PVD (peripheral vascular disease) ([place]) 03/14/2020   CAD (coronary artery disease) 03/14/2020   Iron deficiency anemia 03/14/2020   DOE (dyspnea on exertion) 03/10/2020  H/O adenomatous polyp of colon 01/02/2020   FH: colon cancer 01/02/2020   Heart disease 12/11/2019   Upper airway cough syndrome 11/13/2019   Overweight with body mass index (BMI) of 29 to 29.9 in adult 10/16/2019   Essential hypertension 09/10/2019   Hyperlipemia 09/10/2019    Ardis Rowan, PT 10/28/2021, 8:13 AM  [facility] [address] [place], [place], [address] Phone: [phone]   Fax:  [phone]  Name: ELEXA KIVI MRN: [id number] Date of Birth: 12-Jul-1946

## 2021-10-29 DIAGNOSIS — H25812 Combined forms of age-related cataract, left eye: Secondary | ICD-10-CM | POA: Diagnosis not present

## 2021-11-02 ENCOUNTER — Encounter (HOSPITAL_COMMUNITY)
Admission: RE | Admit: 2021-11-02 | Discharge: 2021-11-02 | Disposition: A | Discharge status: Home / Self Care | Payer: Medicare PPO | Source: Ambulatory Visit | Attending: Ophthalmology | Admitting: Ophthalmology

## 2021-11-02 NOTE — Therapy (Signed)
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   Type 2 diabetes mellitus (HCC)    Past Surgical History:  Procedure Laterality Date   ABDOMINAL HYSTERECTOMY     BACK SURGERY     BARTHOLIN GLAND CYST EXCISION Left 03/04/2015   Procedure: EXCISION OF LEFT BARTHOLINS TUMOR;  Surgeon: Tilda Burrow, MD;  Location: [facility];  Service: Gynecology;  Laterality: Left;   CATARACT EXTRACTION W/PHACO Right 03/27/2021   Procedure: CATARACT EXTRACTION PHACO AND INTRAOCULAR LENS PLACEMENT (IOC);  Surgeon: Fabio Pierce, MD;  Location: [facility];  Service: Ophthalmology;  Laterality: Right;  cde 8.45   CERVICAL SPINE SURGERY     COLONOSCOPY N/A 02/01/2020   Procedure: COLONOSCOPY;  Surgeon: Corbin Ade, MD; Scattered medium mouth diverticula in the sigmoid and descending colon, otherwise normal  exam.   CYST REMOVAL TRUNK     ESOPHAGOGASTRODUODENOSCOPY (EGD) WITH PROPOFOL N/A 03/27/2020   Procedure: ESOPHAGOGASTRODUODENOSCOPY (EGD) WITH PROPOFOL;  Surgeon: Corbin Ade, MD;  [facility]: [facility];  Service: Endoscopy;  Laterality: N/A;  1:00pm   GIVENS CAPSULE STUDY N/A 03/27/2020   Procedure: GIVENS CAPSULE STUDY;  Surgeon: Corbin Ade, MD;  Location: [facility];  Service: Endoscopy;  Laterality: N/A;   Multiple cyst removal surgeries     Stent of lower extremities     Patient Active Problem List   Diagnosis Date Noted   Concussion with no loss of consciousness 09/22/2021   Persistent headaches 09/22/2021   Frequent headaches 09/22/2021   Prediabetes 09/22/2021   LFTs abnormal 02/06/2021   Herpes 01/02/2021   Palpitations 11/27/2020   Depression, major, single episode, severe (HCC) 10/22/2020   Insomnia 10/22/2020   Caregiver role strain 07/23/2020   Primary osteoarthritis of first carpometacarpal joint of right hand 07/16/2020   Annual visit for general adult medical examination with abnormal findings 04/16/2020   Postmenopausal 04/16/2020   Screening mammogram, encounter for 04/16/2020   Atypical mole 04/16/2020   Need for immunization against influenza 04/16/2020   Constipation 03/17/2020   PVD (peripheral vascular disease) (HCC) 03/14/2020   CAD (coronary artery disease) 03/14/2020   Iron deficiency anemia 03/14/2020   DOE (dyspnea  on exertion) 03/10/2020   H/O adenomatous polyp of colon 01/02/2020   FH: colon cancer 01/02/2020   Heart disease 12/11/2019   Upper airway cough syndrome 11/13/2019   Overweight with body mass index (BMI) of 29 to 29.9 in adult 10/16/2019   Essential hypertension 09/10/2019   Hyperlipemia 09/10/2019    REFERRING DIAG: dizziness/vertigo  THERAPY DIAG:  Dizziness and giddiness  Other abnormalities of gait and mobility  PERTINENT HISTORY: HTN, DM  PRECAUTIONS: fall  SUBJECTIVE: Patient states she has been compliant  with HEP, she reports less light-headedness and dizziness today.  The last time she had an episode was on Sat.   PAIN:  Are you having pain? No     TODAY'S TREATMENT:  Seated Horizontal Smooth Pursuit  x 10 reps Seated Vertical Smooth Pursuit  10 reps seated Gaze Stabilization with Two Near Targets and Head Rotation - 10 reps Seated Gaze Stabilization with Head Nod and Vertical Arm Movement -10 reps  Exercises Standing Single Leg Stance with Counter Support -2 x 10 sec Standing Tandem Balance with Counter Support - 2 x 10 sec, 1 x 10 sec with eyes closed  Ambulation x 2 laps around gym with gait speed changes, head turns horizontally and vertically  * gait belt and CGA needed for safety with all balance exercises and ambulation  PATIENT EDUCATION: Education details: HEP Person educated: Patient Education method: Programmer, multimedia, Demonstration, Verbal cues, and Handouts Education comprehension: verbalized understanding and returned demonstration   HOME EXERCISE PROGRAM: Access Code: [facility] URL: [email or web address] Date: 11/03/2021 Prepared by: [facility]  Exercises Seated Horizontal Smooth Pursuit - 1 x daily - 7 x weekly - 3 sets - 10 reps Seated Vertical Smooth Pursuit - 1 x daily - 7 x weekly - 3 sets - 10 reps Narrow Stance Gaze Stabilization with Two Near Targets and Head Rotation - 1 x daily - 7 x weekly - 3 sets - 10 reps Seated Gaze Stabilization with Head Nod and Vertical Arm Movement - 1 x daily - 7 x weekly - 3 sets - 10 reps  Access Code: [email or web address] URL: [email or web address] Date: 11/03/2021 Prepared by: [facility]  Exercises Standing Single Leg Stance with Counter Support - 1 x daily - 7 x weekly - 2 sets - 3 reps - 10-30 sec hold Standing Tandem Balance with Counter Support - 1 x daily - 7 x weekly - 2 sets - 3 reps - 10-30 sec hold     PT Short Term Goals - 10/27/21 0803       PT SHORT TERM GOAL #1   Title Patient will  be independent in HEP to improve functional outcomes.    Time 2    Period Weeks    Status New    Target Date 11/10/21              PT Long Term Goals - 10/27/21 0804       PT LONG TERM GOAL #1   Title Patient will report at least 50% improvement in overall symptoms and/or function to demonstrate improved functional mobility    Time 4    Period Weeks    Status New    Target Date 11/24/21      PT LONG TERM GOAL #2   Title Patient will meet predicted FOTO score to demonstrate improved overall function.    Time 4    Period Weeks    Status New    Target Date 11/24/21      PT  LONG TERM GOAL #3   Title Patient will improve DGI score from 9 to 12 demonstrating improved functional mobility and decreased fall risk to improve patient's ability to walk in the community.    Baseline DGI 9    Time 4    Period Weeks    Status New    Target Date 11/24/21            Plan - 11/03/21 0758       Clinical Impression Statement Today's session focused on review of evaluation goals and review of initial HEP exercise.  Therapist added gaze stabilization progression exercises and balance exercises today.  She needs CGA for safety with all standing exercise and needs min A x 2 times with walking today to maintain balance as she takes a misstep. Patient will continue to benefit from skilled therapy services to reduce deficits and improve functional level.     Personal Factors and Comorbidities Age;Comorbidity 1     Comorbidities HTN, DM, CAD     Examination-Activity Limitations Bathing;Bed Mobility;Locomotion Level;Stand;Caring for Others;Carry;Transfers;Dressing;Sleep;Sit     Examination-Participation Restrictions Church;Laundry;Cleaning;Shop;Community Activity;Meal Prep;Driving     Stability/Clinical Decision Making Evolving/Moderate complexity     Clinical Decision Making Moderate     Rehab Potential Good     PT Frequency 2x / week     PT Duration 4 weeks     PT Treatment/Interventions  ADLs/Self Care Home Management;Aquatic Therapy;Biofeedback;Canalith Repostioning;Traction;Moist Heat;Electrical Stimulation;Iontophoresis 4mg /ml Dexamethasone;Functional mobility training;Stair training;Cryotherapy;Gait training;DME Instruction;Therapeutic activities;Therapeutic exercise;Balance training;Neuromuscular re-education;Orthotic Fit/Training;Patient/family education;Cognitive remediation;Wheelchair mobility training;Manual techniques;Taping;Splinting;Energy conservation;Dry needling;Passive range of motion;Scar mobilization;Vestibular;Visual/perceptual remediation/compensation;Spinal Manipulations;Joint Manipulations     PT Next Visit Plan work on dynamic standing balance, combine gaze stabilization with standing balance exercise     10:49 AM, 11/03/21 Naomii Kreger Small Raynie Steinhaus MPT [facility] [place] [phone] Ph:[phone]

## 2021-11-03 ENCOUNTER — Encounter (HOSPITAL_COMMUNITY): Payer: Self-pay | Admitting: Speech Pathology

## 2021-11-03 ENCOUNTER — Other Ambulatory Visit: Payer: Self-pay

## 2021-11-03 ENCOUNTER — Ambulatory Visit (HOSPITAL_COMMUNITY): Payer: Medicare PPO | Admitting: Speech Pathology

## 2021-11-03 ENCOUNTER — Ambulatory Visit (HOSPITAL_COMMUNITY): Payer: Medicare PPO

## 2021-11-03 DIAGNOSIS — R2689 Other abnormalities of gait and mobility: Secondary | ICD-10-CM | POA: Diagnosis not present

## 2021-11-03 DIAGNOSIS — R41841 Cognitive communication deficit: Secondary | ICD-10-CM

## 2021-11-03 DIAGNOSIS — R42 Dizziness and giddiness: Secondary | ICD-10-CM | POA: Diagnosis not present

## 2021-11-03 DIAGNOSIS — R413 Other amnesia: Secondary | ICD-10-CM | POA: Diagnosis not present

## 2021-11-03 NOTE — H&P (Signed)
Surgical History & Physical ? ?Patient Name: Nicole Horn DOB: Oct 08, 1945 ? ?Surgery: Cataract extraction with intraocular lens implant phacoemulsification; Left Eye ? ?Surgeon: Baruch Goldmann MD ?Surgery Date:  11-06-21 ?Pre-Op Date:  10-29-21 ? ?HPI: ?A 54 Yr. old female patient 1. The patient is returning for an OCT macula, Inflammation check 4 weeks follow-up of the right eye. Since the last visit, the affected area is doing well. The patient's vision is stable. The patient does not use drops on a regular basis, the patient will occasionally use refresh drops. The patient experiences no eye pain and no flashes, floater, shadow, curtain or veil. The patient reports a slight discomfort from time to time OD. Patient also here for repeat eval of the cataract in the left eye. Patient states that there is a very large difference between the two eyes. The left eye is much blurrier. Reading is very difficulty out of the left eye and in general. This is negatively affecting the patient's quality of life and the patient is unable to function adequately in life with the current level of vision. HPI was performed by Baruch Goldmann . ? ?Medical History: ?Cataracts ?High Blood Pressure ?Hypercholesterolemia, Heart disease, Diabetic susp... ? ?Review of Systems ?Negative Allergic/Immunologic ?Negative Cardiovascular ?Negative Constitutional ?Negative Ear, Nose, Mouth & Throat ?Negative Endocrine ?Negative Eyes ?Negative Gastrointestinal ?Negative Genitourinary ?Negative Hemotologic/Lymphatic ?Negative Integumentary ?Negative Musculoskeletal ?Negative Neurological ?Negative Psychiatry ?Negative Respiratory ? ?Social ?  Never smoked  ? ?Medication ?Refresh artificial tears,  ?Albuterol inhaler, Amlodipine, Aspirin, Meloxicam, Rosuvastatin, Olmesartan, HCTZ, Multivitamin,  ? ?Sx/Procedures ?Phaco c IOL OD- Toric,  ?Hysterectomy, Lumpectomy,  ? ?Drug Allergies  ? NKDA ? ?History & Physical: ?Heent: Cataract, Left  Eye ?NECK: supple without bruits ?LUNGS: lungs clear to auscultation ?CV: regular rate and rhythm ?Abdomen: soft and non-tender ? ?Impression & Plan: ?Assessment: ?1.  CATARACT EXTRACTION STATUS; Right Eye (Z98.41) ?2.  INTRAOCULAR LENS IOL ; Right Eye (Z96.1) ?3.  COMBINED FORMS AGE RELATED CATARACT; Left Eye [phone]) ? ?Plan: 1.  6 months after Phaco PCIOL. ?Resolved inflammation ?Status post Medrol Dose Pack ?No WBC on AC check today. ?Stop steroid drops. ?Call with any worsening vision, pain, or any other concerns. ? ?2.  As above. ? ?3.  Cataract accounts for the patient's decreased vision. This visual impairment is not correctable with a tolerable change in glasses or contact lenses. Cataract surgery with an implantation of a new lens should significantly improve the visual and functional status of the patient. Discussed all risks, benefits, alternatives, and potential complications. Discussed the procedures and recovery. Patient desires to have surgery. A-scan ordered and performed today for intra-ocular lens calculations. The surgery will be performed in order to improve vision for driving, reading, and for eye examinations. Recommend phacoemulsification with intra-ocular lens. Recommend Dextenza for post-operative pain and inflammation. ?Left Eye. ?Surgery required to correct imbalance of vision. ?Dilates well - shugarcaine by protocol. ?Toric Lens. ?

## 2021-11-03 NOTE — Therapy (Signed)
[facility] ?[address] ?[place], [place], [address] ?Phone: [phone]   Fax:  [phone] ? ?Speech Language Pathology Treatment ? ?Patient Details  ?Name: Nicole Horn ?MRN: [id number] ?Date of Birth: 04/11/1946 ?Referring Provider (SLP): [name], MD ? ? ?Encounter Date: 11/03/2021 ? ? End of Session - 11/03/21 1515   ? ? Visit Number 5   ? Number of Visits 9   ? Date for SLP Re-Evaluation 11/19/21   ? Authorization Type [employer] Medicare   [employer] mcr ppo eff 08/23/2021-current  $40.00 co-pay  no ded  oop max $8,300.00/ $90.00 met  no visit limit  no co-ins  auth req via cohere  ? Authorization Time Period [facility]Here approved ST 8 visits 10/19/2021-11/27/2021  TSVX#[id number]   ? SLP Start Time 1430   ? SLP Stop Time  1515   ? SLP Time Calculation (min) 45 min   ? Activity Tolerance Patient tolerated treatment well   ? ?  ?  ? ?  ? ? ?Past Medical History:  ?Diagnosis Date  ? 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 Chronic thumb pain, right   ? Essential hypertension   ? Heart attack [facility]) 2004  ? [place] - hospitalized with stress test by report and presumably medical therapy; subsequent cardiac catheterization (no PCI)  ? History of COVID-19 08/2019  ? History of irregular heartbeat   ? History of syphilis   ? Mixed hyperlipidemia   ? PAD (peripheral artery disease) ([place]) 2019  ? PTCA/stent [place]  ? Pneumonia due to COVID-19 virus 09/10/2019  ? Sleep apnea   ? Pt supposed to wear CPAP, but doesn't.  ? Type 2 diabetes mellitus ([place])   ? ? ?Past Surgical History:  ?Procedure Laterality Date  ? ABDOMINAL HYSTERECTOMY    ? BACK SURGERY    ? BARTHOLIN GLAND CYST EXCISION Left 03/04/2015  ? Procedure: EXCISION OF LEFT BARTHOLINS TUMOR;  Surgeon: [name], MD;  Location: [facility];  Service: Gynecology;  Laterality: Left;  ? CATARACT EXTRACTION W/PHACO Right 03/27/2021  ? Procedure: CATARACT EXTRACTION PHACO AND INTRAOCULAR LENS PLACEMENT (IOC);  Surgeon:  Baruch Goldmann, MD;  Location: [facility];  Service: Ophthalmology;  Laterality: Right;  cde 8.45  ? CERVICAL SPINE SURGERY    ? COLONOSCOPY N/A 02/01/2020  ? Procedure: COLONOSCOPY;  Surgeon: Daneil Dolin, MD; Scattered medium mouth diverticula in the sigmoid and descending colon, otherwise normal exam.  ? CYST REMOVAL TRUNK    ? ESOPHAGOGASTRODUODENOSCOPY (EGD) WITH PROPOFOL N/A 03/27/2020  ? Procedure: ESOPHAGOGASTRODUODENOSCOPY (EGD) WITH PROPOFOL;  Surgeon: Daneil Dolin, MD;  [facility];  Service: Endoscopy;  Laterality: N/A;  1:00pm  ? GIVENS CAPSULE STUDY N/A 03/27/2020  ? Procedure: GIVENS CAPSULE STUDY;  Surgeon: Daneil Dolin, MD;  [facility];  Service: Endoscopy;  Laterality: N/A;  ? Multiple cyst removal surgeries    ? Stent of lower extremities    ? ? ?There were no vitals filed for this visit. ? ? Subjective Assessment - 11/03/21 1507   ? ? Subjective "I am feeling really good!"   ? Currently in Pain? No/denies   ? ?  ?  ? ?  ? ? ? ADULT SLP TREATMENT - 11/03/21 1507   ? ?  ? General Information  ? Behavior/Cognition Alert;Cooperative;Pleasant mood   ? Patient Positioning Upright in chair   ? Oral care provided N/A   ? HPI Nicole Horn,  is a  76 y.o. female who presents for evaluation of a head injury that occurred on 08/18/21 when she hit the L side of her head on a display at [employer] while bending over to pick something up.  She was seen at the [facility] ED on 08/21/21 w/ c/o con't HA and was later seen by her PCP on 09/22/21 w/ the same c/o. Pt was referred for cognitive rehab by Dr. [name] due to post concussion symptoms of impaired attention, memory, and word finding. Head CT completed on 08/21/21 was unremarkable. She recently started taking nortriptyline, which has helped her headaches and sleep.   ?  ? Treatment Provided  ? Treatment provided Cognitive-Linquistic   ?  ? Pain Assessment  ? Pain Assessment No/denies pain   ?  ? Cognitive-Linquistic Treatment   ? Treatment focused on Cognition;Patient/family/caregiver education   ? Skilled Treatment SLP   ?  ? Assessment / Recommendations / Plan  ? Plan Continue with current plan of care   ?  ? Progression Toward Goals  ? Progression toward goals Progressing toward goals   ? ?  ?  ? ?  ? ? ? ? ? SLP Short Term Goals - 11/03/21 1522   ? ?  ? SLP SHORT TERM GOAL #1  ? Title Pt will complete selective and alternating attention tasks (moderately complex) with 90% acc and min cues.   ? Baseline ~80%   ? Time 4   ? Status On-going   ? Target Date 11/26/21   ?  ? SLP SHORT TERM GOAL #2  ? Title Pt will increase recall for requested information to 95% acc during functional memory exercises with use of compensatory strategies as needed when provided min cues.   ? Baseline 75%   ? Time 4   ? Period Weeks   ? Status On-going   ? Target Date 11/26/21   ?  ? SLP SHORT TERM GOAL #3  ? Title Pt will utilize external memory strategies in home environment by recording 3 items daily in planner, notebook, daily memory writing task daily for 5/7 days   ? Baseline Pt has system but is still paying bills late and missing appointments   ? Time 4   ? Period Weeks   ? Status On-going   ? Target Date 11/26/21   ?  ? SLP SHORT TERM GOAL #4  ? Title Pt will plan and execute hypothetical problem solving tasks that involve multitasking with 95% acc with min assist from SLP for strategies.   ? Baseline mod assist   ? Time 4   ? Period Weeks   ? Status On-going   ? Target Date 11/26/21   ?  ? SLP SHORT TERM GOAL #5  ? Title Pt will verbally express complex thoughts with use of word finding strategies as needed when describing previous events/activities, "how to" scenarios, and future plans with 90% effectiveness as judged by SLP and Pt through self-evaluation and audio recording when provided min cues.   ? Baseline circumlocution, dysnomia, losing train of thought   ? Time 4   ? Status On-going   ? Target Date 11/26/21   ? ?  ?  ? ?  ? ? ? ? ? Plan -  11/03/21 1521   ? ? Clinical Impression Statement Pt completed HEP as assigned of "brainstorming" task and completed several items on her list. She reported positive feelings regarding progress toward implementing attention and memory strategies at home. She is writing  reminders down, starting to organize her environment at home, going to bed earlier, and doing some exercise at home. She reported that she forgets what she wants to say and/or loses her train of thought in conversations, repeats information and cannot recall who she told what information, and her mind wanders when she is reading. SLP suggested that she take notes or highlight while reading, make quick notes after phone calls to write down what was discussed and with who, write brief summaries after reading or conversations, and to continue to use her notebooks for organizing. She completed an attention task with 70% acc with min cues. Will continue with that tomorrow and use strategies as needed (write it down).  ? Speech Therapy Frequency 2x / week   ? Duration 2 weeks   ? Treatment/Interventions Compensatory strategies;Patient/family education;Cueing hierarchy;SLP instruction and feedback;Compensatory techniques;Cognitive reorganization;Functional tasks   ? Potential to Achieve Goals Good   ? Potential Considerations Ability to learn/carryover information   ? SLP Home Exercise Plan Pt will completed HEP as assigned to facilitate carryover of treatment strategies and techniques in home environment with use of written cues as needed.   ? Consulted and Agree with Plan of Care Patient   ? ?  ?  ? ?  ? ? ?Patient will benefit from skilled therapeutic intervention in order to improve the following deficits and impairments:   ?Cognitive communication deficit ? ?Memory change ? ? ? ?Problem List ?Patient Active Problem List  ? Diagnosis Date Noted  ? Concussion with no loss of consciousness 09/22/2021  ? Persistent headaches 09/22/2021  ? Frequent headaches  09/22/2021  ? Prediabetes 09/22/2021  ? LFTs abnormal 02/06/2021  ? Herpes 01/02/2021  ? Palpitations 11/27/2020  ? Depression, major, single episode, severe ([place]) 10/22/2020  ? Insomnia 10/22/2020  ? Ca

## 2021-11-04 ENCOUNTER — Ambulatory Visit (HOSPITAL_COMMUNITY): Payer: Medicare PPO | Admitting: Speech Pathology

## 2021-11-04 ENCOUNTER — Ambulatory Visit (HOSPITAL_COMMUNITY): Payer: Medicare PPO

## 2021-11-04 ENCOUNTER — Encounter (HOSPITAL_COMMUNITY): Payer: Self-pay | Admitting: Speech Pathology

## 2021-11-04 DIAGNOSIS — R2689 Other abnormalities of gait and mobility: Secondary | ICD-10-CM

## 2021-11-04 DIAGNOSIS — R42 Dizziness and giddiness: Secondary | ICD-10-CM

## 2021-11-04 DIAGNOSIS — R413 Other amnesia: Secondary | ICD-10-CM

## 2021-11-04 DIAGNOSIS — R41841 Cognitive communication deficit: Secondary | ICD-10-CM | POA: Diagnosis not present

## 2021-11-04 NOTE — Therapy (Signed)
[facility] ?[address] ?[place], [place], [address] ?Phone: [phone]   Fax:  [phone] ? ?Speech Language Pathology Treatment ? ?Patient Details  ?Name: Nicole Horn ?MRN: [id number] ?Date of Birth: May 31, 1946 ?Referring Provider (SLP): [name], MD ? ? ?Encounter Date: 11/04/2021 ? ? End of Session - 11/04/21 0954   ? ? Visit Number 6   ? Number of Visits 9   ? Date for SLP Re-Evaluation 11/27/21   ? Authorization Type [employer] Medicare   [employer] mcr ppo eff 08/23/2021-current  $40.00 co-pay  no ded  oop max $8,300.00/ $90.00 met  no visit limit  no co-ins  auth req via cohere  ? Authorization Time Period CoHere approved ST 8 visits 10/19/2021-11/27/2021  CLEX#[id number]   ? SLP Start Time [phone]   ? SLP Stop Time  [phone]   ? SLP Time Calculation (min) 39 min   ? Activity Tolerance Patient tolerated treatment well   ? ?  ?  ? ?  ? ? ?Past Medical History:  ?Diagnosis Date  ? 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 Chronic thumb pain, right   ? Essential hypertension   ? Heart attack [facility]) 2004  ? [place] - hospitalized with stress test by report and presumably medical therapy; subsequent cardiac catheterization (no PCI)  ? History of COVID-19 08/2019  ? History of irregular heartbeat   ? History of syphilis   ? Mixed hyperlipidemia   ? PAD (peripheral artery disease) ([place]) 2019  ? PTCA/stent [place]  ? Pneumonia due to COVID-19 virus 09/10/2019  ? Sleep apnea   ? Pt supposed to wear CPAP, but doesn't.  ? Type 2 diabetes mellitus ([place])   ? ? ?Past Surgical History:  ?Procedure Laterality Date  ? ABDOMINAL HYSTERECTOMY    ? BACK SURGERY    ? BARTHOLIN GLAND CYST EXCISION Left 03/04/2015  ? Procedure: EXCISION OF LEFT BARTHOLINS TUMOR;  Surgeon: [name], MD;  Location: [facility];  Service: Gynecology;  Laterality: Left;  ? CATARACT EXTRACTION W/PHACO Right 03/27/2021  ? Procedure: CATARACT EXTRACTION PHACO AND INTRAOCULAR LENS PLACEMENT (IOC);  Surgeon:  Baruch Goldmann, MD;  Location: [facility];  Service: Ophthalmology;  Laterality: Right;  cde 8.45  ? CERVICAL SPINE SURGERY    ? COLONOSCOPY N/A 02/01/2020  ? Procedure: COLONOSCOPY;  Surgeon: Daneil Dolin, MD; Scattered medium mouth diverticula in the sigmoid and descending colon, otherwise normal exam.  ? CYST REMOVAL TRUNK    ? ESOPHAGOGASTRODUODENOSCOPY (EGD) WITH PROPOFOL N/A 03/27/2020  ? Procedure: ESOPHAGOGASTRODUODENOSCOPY (EGD) WITH PROPOFOL;  Surgeon: Daneil Dolin, MD;  [facility];  Service: Endoscopy;  Laterality: N/A;  1:00pm  ? GIVENS CAPSULE STUDY N/A 03/27/2020  ? Procedure: GIVENS CAPSULE STUDY;  Surgeon: Daneil Dolin, MD;  Location: [facility];  Service: Endoscopy;  Laterality: N/A;  ? Multiple cyst removal surgeries    ? Stent of lower extremities    ? ? ?There were no vitals filed for this visit. ? ? Subjective Assessment - 11/04/21 0945   ? ? Subjective "I was so tired last night, but I couldn't sleep well because I knew I had to be up early."   ? Currently in Pain? No/denies   ? ?  ?  ? ?  ? ? ? ? ? ADULT SLP TREATMENT - 11/04/21 0946   ? ?  ? General Information  ? Behavior/Cognition Alert;Cooperative;Pleasant mood   ? Patient Positioning Upright in  chair   ? Oral care provided N/A   ? HPI Nicole Horn,  is a 76 y.o. female who presents for evaluation of a head injury that occurred on 08/18/21 when she hit the L side of her head on a display at [place] while bending over to pick something up.  She was seen at the [facility] ED on 08/21/21 w/ c/o con't HA and was later seen by her PCP on 09/22/21 w/ the same c/o. Pt was referred for cognitive rehab by Dr. [name] due to post concussion symptoms of impaired attention, memory, and word finding. Head CT completed on 08/21/21 was unremarkable. She recently started taking nortriptyline, which has helped her headaches and sleep.   ?  ? Treatment Provided  ? Treatment provided Cognitive-Linquistic   ?  ? Pain Assessment  ?  Pain Assessment No/denies pain   ?  ? Cognitive-Linquistic Treatment  ? Treatment focused on Cognition;Patient/family/caregiver education   ? Skilled Treatment SLP provided Pt with personalized templates to help organize her daily "to do" tasks. She recorded items under specific categories (exercise, to pay, cleaning, leisure, phone calls, cleaning, etc). She was then asked to go through the items and highlight in one color the most pressing items (highest priority) and then highlight in a different color the next and so forth. She was also provided information regarding mental fatigue and strategies to combat it.   ?  ? Assessment / Recommendations / Plan  ? Plan Continue with current plan of care   ?  ? Progression Toward Goals  ? Progression toward goals Progressing toward goals   ? ?  ?  ? ?  ? ? ? ? ? SLP Short Term Goals - 11/04/21 0955   ? ?  ? SLP SHORT TERM GOAL #1  ? Title Pt will complete selective and alternating attention tasks (moderately complex) with 90% acc and min cues.   ? Baseline ~80%   ? Time 4   ? Status On-going   ? Target Date 11/26/21   ?  ? SLP SHORT TERM GOAL #2  ? Title Pt will increase recall for requested information to 95% acc during functional memory exercises with use of compensatory strategies as needed when provided min cues.   ? Baseline 75%   ? Time 4   ? Period Weeks   ? Status On-going   ? Target Date 11/26/21   ?  ? SLP SHORT TERM GOAL #3  ? Title Pt will utilize external memory strategies in home environment by recording 3 items daily in planner, notebook, daily memory writing task daily for 5/7 days   ? Baseline Pt has system but is still paying bills late and missing appointments   ? Time 4   ? Period Weeks   ? Status On-going   ? Target Date 11/26/21   ?  ? SLP SHORT TERM GOAL #4  ? Title Pt will plan and execute hypothetical problem solving tasks that involve multitasking with 95% acc with min assist from SLP for strategies.   ? Baseline mod assist   ? Time 4   ? Period  Weeks   ? Status On-going   ? Target Date 11/26/21   ?  ? SLP SHORT TERM GOAL #5  ? Title Pt will verbally express complex thoughts with use of word finding strategies as needed when describing previous events/activities, "how to" scenarios, and future plans with 90% effectiveness as judged by SLP and Pt through self-evaluation and  audio recording when provided min cues.   ? Baseline circumlocution, dysnomia, losing train of thought   ? Time 4   ? Status On-going   ? Target Date 11/26/21   ? ?  ?  ? ?  ? ? ? ? ? Plan - 11/04/21 0955   ? ? Clinical Impression Statement Pt continues to be alert and cooperative and reports continued feelings of positivity ("I feel like I am on the right track!"). She will take the list created in session today and complete the highest priority items first and report back next session. She will attend two sessions next week and will likely add a third and final session the following week. HEP provided.  ? Speech Therapy Frequency 2x / week   ? Treatment/Interventions Compensatory strategies;Patient/family education;Cueing hierarchy;SLP instruction and feedback;Compensatory techniques;Cognitive reorganization;Functional tasks   ? Potential to Achieve Goals Good   ? Potential Considerations Ability to learn/carryover information   ? SLP Home Exercise Plan Pt will completed HEP as assigned to facilitate carryover of treatment strategies and techniques in home environment with use of written cues as needed.   ? Consulted and Agree with Plan of Care Patient   ? ?  ?  ? ?  ? ? ?Patient will benefit from skilled therapeutic intervention in order to improve the following deficits and impairments:   ?Cognitive communication deficit ? ?Memory change ? ? ? ?Problem List ?Patient Active Problem List  ? Diagnosis Date Noted  ? Concussion with no loss of consciousness 09/22/2021  ? Persistent headaches 09/22/2021  ? Frequent headaches 09/22/2021  ? Prediabetes 09/22/2021  ? LFTs abnormal 02/06/2021   ? Herpes 01/02/2021  ? Palpitations 11/27/2020  ? Depression, major, single episode, severe ([place]) 10/22/2020  ? Insomnia 10/22/2020  ? Caregiver role strain 07/23/2020  ? Primary osteoarthritis of first

## 2021-11-04 NOTE — Therapy (Signed)
OUTPATIENT PHYSICAL THERAPY TREATMENT NOTE   Patient Name: Nicole Horn MRN: [id number] DOB:06-30-1946, 76 y.o., female Today's Date: 11/04/2021  PCP: Donell Beers, FNP REFERRING PROVIDER: Donell Beers, FNP   PT End of Session - 11/04/21 1000     Visit Number 3    Number of Visits 8    Date for PT Re-Evaluation 11/24/21    Authorization Type [employer] Medicare Cho- no co insurance, co-pay 40, Ded 350, no VL    PT Start Time 0945    PT Stop Time 1024    PT Time Calculation (min) 39 min    Activity Tolerance Patient tolerated treatment well             Past Medical History:  Diagnosis Date   Benign tumor of Bartholin's gland 02/26/2015   Chronic thumb pain, right    Essential hypertension    Heart attack ([facility]) 2004   [place] - hospitalized with stress test by report and presumably medical therapy; subsequent cardiac catheterization (no PCI)   History of COVID-19 08/2019   History of irregular heartbeat    History of syphilis    Mixed hyperlipidemia    PAD (peripheral artery disease) (H[facility]) 2019   PTCA/stent [place]   Pneumonia due to COVID-19 virus 09/10/2019   Sleep apnea    Pt supposed to wear CPAP, but doesn't.   Type 2 diabetes mellitus ([facility])    Past Surgical History:  Procedure Laterality Date   ABDOMINAL HYSTERECTOMY     BACK SURGERY     BARTHOLIN GLAND CYST EXCISION Left 03/04/2015   Procedure: EXCISION OF LEFT BARTHOLINS TUMOR;  Surgeon: Tilda Burrow, MD;  Location: [facility];  Service: Gynecology;  Laterality: Left;   CATARACT EXTRACTION W/PHACO Right 03/27/2021   Procedure: CATARACT EXTRACTION PHACO AND INTRAOCULAR LENS PLACEMENT (IOC);  Surgeon: Fabio Pierce, MD;  Location: [facility];  Service: Ophthalmology;  Laterality: Right;  cde 8.45   CERVICAL SPINE SURGERY     COLONOSCOPY N/A 02/01/2020   Procedure: COLONOSCOPY;  Surgeon: Corbin Ade, MD; Scattered medium mouth diverticula in the sigmoid and descending colon, otherwise normal  exam.   CYST REMOVAL TRUNK     ESOPHAGOGASTRODUODENOSCOPY (EGD) WITH PROPOFOL N/A 03/27/2020   Procedure: ESOPHAGOGASTRODUODENOSCOPY (EGD) WITH PROPOFOL;  Surgeon: Corbin Ade, MD;  [facility];  Service: Endoscopy;  Laterality: N/A;  1:00pm   GIVENS CAPSULE STUDY N/A 03/27/2020   Procedure: GIVENS CAPSULE STUDY;  Surgeon: Corbin Ade, MD;  [facility];  Service: Endoscopy;  Laterality: N/A;   Multiple cyst removal surgeries     Stent of lower extremities     Patient Active Problem List   Diagnosis Date Noted   Concussion with no loss of consciousness 09/22/2021   Persistent headaches 09/22/2021   Frequent headaches 09/22/2021   Prediabetes 09/22/2021   LFTs abnormal 02/06/2021   Herpes 01/02/2021   Palpitations 11/27/2020   Depression, major, single episode, severe (HCC) 10/22/2020   Insomnia 10/22/2020   Caregiver role strain 07/23/2020   Primary osteoarthritis of first carpometacarpal joint of right hand 07/16/2020   Annual visit for general adult medical examination with abnormal findings 04/16/2020   Postmenopausal 04/16/2020   Screening mammogram, encounter for 04/16/2020   Atypical mole 04/16/2020   Need for immunization against influenza 04/16/2020   Constipation 03/17/2020   PVD (peripheral vascular disease) (HCC) 03/14/2020   CAD (coronary artery disease) 03/14/2020   Iron deficiency anemia 03/14/2020   DOE (dyspnea  on exertion) 03/10/2020   H/O adenomatous polyp of colon 01/02/2020   FH: colon cancer 01/02/2020   Heart disease 12/11/2019   Upper airway cough syndrome 11/13/2019   Overweight with body mass index (BMI) of 29 to 29.9 in adult 10/16/2019   Essential hypertension 09/10/2019   Hyperlipemia 09/10/2019    REFERRING DIAG: dizziness/vertigo  THERAPY DIAG:  Dizziness and giddiness  Other abnormalities of gait and mobility  PERTINENT HISTORY: HTN, DM  PRECAUTIONS: fall  SUBJECTIVE: Patient states she has been compliant  with HEP, on arrival to PT today she is feeling lightheaded and states she has not eaten breakfast but did take her medications.  She has a snack bar in her purse and eats that prior to starting physical therapy. She feels back to normal after eating.  PAIN:  Are you having pain? No     TODAY'S TREATMENT:  Seated Horizontal Smooth Pursuit  x 10 reps Seated Vertical Smooth Pursuit  10 reps seated Gaze Stabilization with Two Near Targets and Head Rotation - 10 reps Seated Gaze Stabilization with Head Nod and Vertical Arm Movement -10 reps  Exercises Standing Single Leg Stance with Counter Support -2 x 10 sec Standing Tandem Balance with Counter Support - 2 x 10 sec, 1 x 10 sec with eyes closed  Combined SLS and horizontal head turns 2 sets of 10 Combined tandem stance with horizontal head turns 2 sets of 10  Ambulation x 2 laps around gym with gait speed changes, head turns horizontally and vertically  Ambulation figure 8 walking around cones x 4  Ambulation stepping over cones x 4  * gait belt and CGA needed for safety with all balance exercises and ambulation  PATIENT EDUCATION: Education details: HEP progression Person educated: Patient Education method: Explanation, Demonstration, Verbal cues, and Handouts Education comprehension: verbalized understanding and returned demonstration   HOME EXERCISE PROGRAM: Access Code: [facility] URL: [email or web address] Date: 11/03/2021 Prepared by: [facility]  Exercises Seated Horizontal Smooth Pursuit - 1 x daily - 7 x weekly - 3 sets - 10 reps Seated Vertical Smooth Pursuit - 1 x daily - 7 x weekly - 3 sets - 10 reps Narrow Stance Gaze Stabilization with Two Near Targets and Head Rotation - 1 x daily - 7 x weekly - 3 sets - 10 reps Seated Gaze Stabilization with Head Nod and Vertical Arm Movement - 1 x daily - 7 x weekly - 3 sets - 10 reps  Access Code: [email or web address] URL: [email or web address] Date:  11/03/2021 Prepared by: [facility] - [facility]  Exercises Standing Single Leg Stance with Counter Support - 1 x daily - 7 x weekly - 2 sets - 3 reps - 10-30 sec hold Standing Tandem Balance with Counter Support - 1 x daily - 7 x weekly - 2 sets - 3 reps - 10-30 sec hold     PT Short Term Goals - 10/27/21 0803       PT SHORT TERM GOAL #1   Title Patient will be independent in HEP to improve functional outcomes.    Time 2    Period Weeks    Status New    Target Date 11/10/21              PT Long Term Goals - 10/27/21 0804       PT LONG TERM GOAL #1   Title Patient will report at least 50% improvement in overall symptoms and/or function to demonstrate improved functional mobility    Time 4  Period Weeks    Status New    Target Date 11/24/21      PT LONG TERM GOAL #2   Title Patient will meet predicted FOTO score to demonstrate improved overall function.    Time 4    Period Weeks    Status New    Target Date 11/24/21      PT LONG TERM GOAL #3   Title Patient will improve DGI score from 9 to 12 demonstrating improved functional mobility and decreased fall risk to improve patient's ability to walk in the community.    Baseline DGI 9    Time 4    Period Weeks    Status New    Target Date 11/24/21               Clinical Impression Statement Today's session focused on progression of HEP and gaze stabilization and balance exercise.  Therapist added combination of gaze stabilization progression exercises and balance exercises today.  Therapist also added figure 8 walking and stepping over cones to work on dynamic balance.  She needs CGA for safety with all standing exercise and needs min A x 2 times with walking today 2 laps to maintain balance as she takes a misstep but significant improvement from eval. Patient will continue to benefit from skilled therapy services to reduce deficits and improve functional level.     Personal Factors and Comorbidities Age;Comorbidity 1      Comorbidities HTN, DM, CAD     Examination-Activity Limitations Bathing;Bed Mobility;Locomotion Level;Stand;Caring for Others;Carry;Transfers;Dressing;Sleep;Sit     Examination-Participation Restrictions Church;Laundry;Cleaning;Shop;Community Activity;Meal Prep;Driving     Stability/Clinical Decision Making Evolving/Moderate complexity     Clinical Decision Making Moderate     Rehab Potential Good     PT Frequency 2x / week     PT Duration 4 weeks     PT Treatment/Interventions ADLs/Self Care Home Management;Aquatic Therapy;Biofeedback;Canalith Repostioning;Traction;Moist Heat;Electrical Stimulation;Iontophoresis 4mg /ml Dexamethasone;Functional mobility training;Stair training;Cryotherapy;Gait training;DME Instruction;Therapeutic activities;Therapeutic exercise;Balance training;Neuromuscular re-education;Orthotic Fit/Training;Patient/family education;Cognitive remediation;Wheelchair mobility training;Manual techniques;Taping;Splinting;Energy conservation;Dry needling;Passive range of motion;Scar mobilization;Vestibular;Visual/perceptual remediation/compensation;Spinal Manipulations;Joint Manipulations     PT Next Visit Plan work on dynamic standing balance, combine gaze stabilization with standing balance exercise     11:39 AM, 11/04/21 Derak Schurman Small Jolinda Pinkstaff MPT [facility] [place] [phone] Ph:[phone]

## 2021-11-05 ENCOUNTER — Other Ambulatory Visit: Payer: Self-pay

## 2021-11-05 ENCOUNTER — Encounter (HOSPITAL_COMMUNITY): Payer: Self-pay

## 2021-11-06 ENCOUNTER — Encounter (HOSPITAL_COMMUNITY)
Admission: RE | Disposition: A | Discharge status: Home / Self Care | Payer: Self-pay | Source: Ambulatory Visit | Attending: Ophthalmology

## 2021-11-06 ENCOUNTER — Ambulatory Visit (HOSPITAL_BASED_OUTPATIENT_CLINIC_OR_DEPARTMENT_OTHER): Payer: Medicare PPO | Admitting: Anesthesiology

## 2021-11-06 ENCOUNTER — Ambulatory Visit (HOSPITAL_COMMUNITY)
Admission: RE | Admit: 2021-11-06 | Discharge: 2021-11-06 | Disposition: A | Discharge status: Home / Self Care | Payer: Medicare PPO | Source: Ambulatory Visit | Attending: Ophthalmology | Admitting: Ophthalmology

## 2021-11-06 ENCOUNTER — Ambulatory Visit (HOSPITAL_COMMUNITY): Payer: Medicare PPO | Admitting: Anesthesiology

## 2021-11-06 ENCOUNTER — Encounter (HOSPITAL_COMMUNITY): Payer: Self-pay | Admitting: Ophthalmology

## 2021-11-06 DIAGNOSIS — H25812 Combined forms of age-related cataract, left eye: Secondary | ICD-10-CM | POA: Insufficient documentation

## 2021-11-06 DIAGNOSIS — Z87891 Personal history of nicotine dependence: Secondary | ICD-10-CM | POA: Insufficient documentation

## 2021-11-06 DIAGNOSIS — I252 Old myocardial infarction: Secondary | ICD-10-CM

## 2021-11-06 DIAGNOSIS — I2510 Atherosclerotic heart disease of native coronary artery without angina pectoris: Secondary | ICD-10-CM | POA: Diagnosis not present

## 2021-11-06 DIAGNOSIS — Z961 Presence of intraocular lens: Secondary | ICD-10-CM | POA: Insufficient documentation

## 2021-11-06 DIAGNOSIS — J45909 Unspecified asthma, uncomplicated: Secondary | ICD-10-CM | POA: Diagnosis not present

## 2021-11-06 DIAGNOSIS — H52222 Regular astigmatism, left eye: Secondary | ICD-10-CM | POA: Diagnosis not present

## 2021-11-06 DIAGNOSIS — H52202 Unspecified astigmatism, left eye: Secondary | ICD-10-CM

## 2021-11-06 DIAGNOSIS — E119 Type 2 diabetes mellitus without complications: Secondary | ICD-10-CM | POA: Diagnosis not present

## 2021-11-06 DIAGNOSIS — Z955 Presence of coronary angioplasty implant and graft: Secondary | ICD-10-CM | POA: Diagnosis not present

## 2021-11-06 DIAGNOSIS — Z9841 Cataract extraction status, right eye: Secondary | ICD-10-CM | POA: Insufficient documentation

## 2021-11-06 DIAGNOSIS — I10 Essential (primary) hypertension: Secondary | ICD-10-CM | POA: Diagnosis not present

## 2021-11-06 DIAGNOSIS — E1136 Type 2 diabetes mellitus with diabetic cataract: Secondary | ICD-10-CM | POA: Diagnosis not present

## 2021-11-06 HISTORY — PX: CATARACT EXTRACTION W/PHACO: SHX586

## 2021-11-06 LAB — GLUCOSE, CAPILLARY: Glucose-Capillary: 139 mg/dL — ABNORMAL HIGH (ref 70–99)

## 2021-11-06 SURGERY — PHACOEMULSIFICATION, CATARACT, WITH IOL INSERTION
Anesthesia: Monitor Anesthesia Care | Site: Eye | Laterality: Left

## 2021-11-06 MED ORDER — SODIUM CHLORIDE 0.9% FLUSH
INTRAVENOUS | Status: DC | PRN
  Administered 2021-11-06: 5 mL via INTRAVENOUS

## 2021-11-06 MED ORDER — PHENYLEPHRINE HCL 2.5 % OP SOLN
1.0000 [drp] | OPHTHALMIC | Status: AC | PRN
  Administered 2021-11-06 (×3): 1 [drp] via OPHTHALMIC

## 2021-11-06 MED ORDER — STERILE WATER FOR IRRIGATION IR SOLN
Status: DC | PRN
Start: 2021-11-06 — End: 2021-11-06
  Administered 2021-11-06: 250 mL

## 2021-11-06 MED ORDER — TROPICAMIDE 1 % OP SOLN
1.0000 [drp] | OPHTHALMIC | Status: AC | PRN
  Administered 2021-11-06 (×3): 1 [drp] via OPHTHALMIC
  Filled 2021-11-06: qty 3

## 2021-11-06 MED ORDER — POVIDONE-IODINE 5 % OP SOLN
OPHTHALMIC | Status: DC | PRN
  Administered 2021-11-06: 1 via OPHTHALMIC

## 2021-11-06 MED ORDER — SODIUM HYALURONATE 10 MG/ML IO SOLUTION
PREFILLED_SYRINGE | INTRAOCULAR | Status: DC | PRN
  Administered 2021-11-06: 0.85 mL via INTRAOCULAR

## 2021-11-06 MED ORDER — SODIUM HYALURONATE 23MG/ML IO SOSY
PREFILLED_SYRINGE | INTRAOCULAR | Status: DC | PRN
  Administered 2021-11-06: 0.6 mL via INTRAOCULAR

## 2021-11-06 MED ORDER — TETRACAINE HCL 0.5 % OP SOLN
1.0000 [drp] | OPHTHALMIC | Status: AC | PRN
  Administered 2021-11-06 (×3): 1 [drp] via OPHTHALMIC

## 2021-11-06 MED ORDER — NEOMYCIN-POLYMYXIN-DEXAMETH 3.5-10000-0.1 OP SUSP
OPHTHALMIC | Status: DC | PRN
Start: 2021-11-06 — End: 2021-11-06
  Administered 2021-11-06: 2 [drp] via OPHTHALMIC

## 2021-11-06 MED ORDER — LIDOCAINE HCL 3.5 % OP GEL: 1.0000 "application " | Freq: Once | OPHTHALMIC | Status: DC

## 2021-11-06 MED ORDER — MIDAZOLAM HCL 2 MG/2ML IJ SOLN
INTRAMUSCULAR | Status: DC | PRN
Start: 2021-11-06 — End: 2021-11-06
  Administered 2021-11-06: 2 mg via INTRAVENOUS

## 2021-11-06 MED ORDER — BSS IO SOLN
INTRAOCULAR | Status: DC | PRN
  Administered 2021-11-06: 15 mL via INTRAOCULAR

## 2021-11-06 MED ORDER — EPINEPHRINE PF 1 MG/ML IJ SOLN
INTRAOCULAR | Status: DC | PRN
  Administered 2021-11-06: 500 mL

## 2021-11-06 MED ORDER — MIDAZOLAM HCL 2 MG/2ML IJ SOLN
INTRAMUSCULAR | Status: AC
Start: 2021-11-06 — End: ?
  Filled 2021-11-06: qty 2

## 2021-11-06 MED ORDER — LIDOCAINE HCL (PF) 1 % IJ SOLN
INTRAOCULAR | Status: DC | PRN
  Administered 2021-11-06: 1 mL via OPHTHALMIC

## 2021-11-06 MED ORDER — EPINEPHRINE PF 1 MG/ML IJ SOLN
INTRAMUSCULAR | Status: AC
  Filled 2021-11-06: qty 2

## 2021-11-06 SURGICAL SUPPLY — 16 items
CATARACT SUITE SIGHTPATH (MISCELLANEOUS) ×2 IMPLANT
CLOTH BEACON ORANGE TIMEOUT ST (SAFETY) ×2 IMPLANT
EYE SHIELD UNIVERSAL CLEAR (GAUZE/BANDAGES/DRESSINGS) ×1 IMPLANT
FEE CATARACT SUITE SIGHTPATH (MISCELLANEOUS) ×1 IMPLANT
GLOVE SURG UNDER POLY LF SZ6.5 (GLOVE) ×1 IMPLANT
GLOVE SURG UNDER POLY LF SZ7 (GLOVE) ×1 IMPLANT
LENS IOL EYHANCE TORIC II 15.5 ×1 IMPLANT
LENS IOL EYHANCE TRC 150 15.5 IMPLANT
LENS IOL EYHNC TORIC 150 15.5 ×1 IMPLANT
NDL HYPO 18GX1.5 BLUNT FILL (NEEDLE) ×1 IMPLANT
NEEDLE HYPO 18GX1.5 BLUNT FILL (NEEDLE) ×2 IMPLANT
PAD ARMBOARD 7.5X6 YLW CONV (MISCELLANEOUS) ×2 IMPLANT
SYR TB 1ML LL NO SAFETY (SYRINGE) ×2 IMPLANT
TAPE SURG TRANSPORE 1 IN (GAUZE/BANDAGES/DRESSINGS) IMPLANT
TAPE SURGICAL TRANSPORE 1 IN (GAUZE/BANDAGES/DRESSINGS) ×2
WATER STERILE IRR 250ML POUR (IV SOLUTION) ×2 IMPLANT

## 2021-11-06 NOTE — Anesthesia Postprocedure Evaluation (Signed)
Anesthesia Post Note ? ?Patient: Nicole Horn ? ?Procedure(s) Performed: CATARACT EXTRACTION PHACO AND INTRAOCULAR LENS PLACEMENT (IOC) (Left: Eye) ? ?Patient location during evaluation: Phase II ?Anesthesia Type: MAC ?Level of consciousness: awake ?Pain management: pain level controlled ?Vital Signs Assessment: post-procedure vital signs reviewed and stable ?Respiratory status: spontaneous breathing and respiratory function stable ?Cardiovascular status: blood pressure returned to baseline and stable ?Postop Assessment: no headache and no apparent nausea or vomiting ?Anesthetic complications: no ?Comments: Late entry ? ? ?No notable events documented. ? ? ?Last Vitals:  ?Vitals:  ? 11/06/21 0817 11/06/21 0924  ?BP: (!) 145/63 134/60  ?Pulse: 65 71  ?Resp: 15 16  ?Temp: 36.7 ?C   ?SpO2: 100% 97%  ?  ?Last Pain:  ?Vitals:  ? 11/06/21 0924  ?TempSrc:   ?PainSc: 0-No pain  ? ? ?  ?  ?  ?  ?  ?  ? ?Louann Sjogren ? ? ? ? ?

## 2021-11-06 NOTE — Op Note (Signed)
Date of procedure: 11/06/21 ? ?Pre-operative diagnosis: Visually significant age-related combined cataract, Left Eye; Visually Significant Astigmatism, Left Eye (H25.812) ? ?Post-operative diagnosis: Visually significant age-related cataract, Left Eye; Visually Significant Astigmatism, Left Eye ? ?Procedure: Removal of cataract via phacoemulsification and insertion of intra-ocular lens Wynetta Emery and Johnson [id number] +18.5D into the capsular bag of the Left Eye ? ?Attending surgeon: Gerda Diss. Marisa Hua, MD, MA ? ?Anesthesia: MAC, Topical [place] ? ?Complications: None ? ?Estimated Blood Loss: <15m (minimal) ? ?Specimens: None ? ?Implants: As above ? ?Indications:  Visually significant age-related cataract, Left Eye; Visually Significant Astigmatism, Left Eye ? ?Procedure:  ?The patient was seen and identified in the pre-operative area. The operative eye was identified and dilated.  The operative eye was marked.  Pre-operative toric markers were used to mark the eye at 0 and 180 degrees. Topical anesthesia was administered to the operative eye.    ? ?The patient was then to the operative suite and placed in the supine position.  A timeout was performed confirming the patient, procedure to be performed, and all other relevant information.   The patient's face was prepped and draped in the usual fashion for intra-ocular surgery.  A lid speculum was placed into the operative eye and the surgical microscope moved into place and focused.  A superotemporal paracentesis was created using a 20 gauge paracentesis blade.  Shugarcaine was injected into the anterior chamber.  Viscoelastic was injected into the anterior chamber.  A temporal clear-corneal main wound incision was created using a 2.4101mmicrokeratome.  A continuous curvilinear capsulorrhexis was initiated using an irrigating cystitome and completed using capsulorrhexis forceps.  Hydrodissection and hydrodeliniation were performed.  Viscoelastic was injected into the anterior  chamber.  A phacoemulsification handpiece and a chopper as a second instrument were used to remove the nucleus and epinucleus. The irrigation/aspiration handpiece was used to remove any remaining cortical material.  ? ?The capsular bag was reinflated with viscoelastic, checked, and found to be intact.  The eye was marked to the per-op meridian.  The intraocular lens was inserted into the capsular bag and dialed into place using a Kuglen hook to ?? degrees.  The irrigation/aspiration handpiece was used to remove any remaining viscoelastic.  The clear corneal wound and paracentesis wounds were then hydrated and checked with Weck-Cels to be watertight.  The lid-speculum and drape was removed, and the patient's face was cleaned with a wet and dry 4x4.  Maxitrol was instilled in the eye before a clear shield was taped over the eye. The patient was taken to the post-operative care unit in good condition, having tolerated the procedure well. ? ?Post-Op Instructions: [facility] and will receive all other orders and instructions. ? ?

## 2021-11-06 NOTE — Anesthesia Preprocedure Evaluation (Signed)
Anesthesia Evaluation  ?Patient identified by MRN, date of birth, ID band ?Patient awake ? ? ? ?Reviewed: ?Allergy & Precautions, H&P , NPO status , Patient's Chart, lab work & pertinent test results, reviewed documented beta blocker date and time  ? ?Airway ?Mallampati: II ? ?TM Distance: >3 FB ?Neck ROM: full ? ? ? Dental ?no notable dental hx. ? ?  ?Pulmonary ?sleep apnea , pneumonia, former smoker,  ?  ?Pulmonary exam normal ?breath sounds clear to auscultation ? ? ? ? ? ? Cardiovascular ?Exercise Tolerance: Good ?hypertension, + CAD, + Past MI and + Cardiac Stents  ? ?Rhythm:regular Rate:Normal ? ? ?  ?Neuro/Psych ? Headaches, negative psych ROS  ? GI/Hepatic ?negative GI ROS, Neg liver ROS,   ?Endo/Other  ?negative endocrine ROSdiabetes, Type 2 ? Renal/GU ?negative Renal ROS  ?negative genitourinary ?  ?Musculoskeletal ? ? Abdominal ?  ?Peds ? Hematology ? ?(+) Blood dyscrasia, anemia ,   ?Anesthesia Other Findings ? ? Reproductive/Obstetrics ?negative OB ROS ? ?  ? ? ? ? ? ? ? ? ? ? ? ? ? ?  ?  ? ? ? ? ? ? ? ? ?Anesthesia Physical ?Anesthesia Plan ? ?ASA: 3 ? ?Anesthesia Plan: MAC  ? ?Post-op Pain Management:   ? ?Induction:  ? ?PONV Risk Score and Plan:  ? ?Airway Management Planned:  ? ?Additional Equipment:  ? ?Intra-op Plan:  ? ?Post-operative Plan:  ? ?Informed Consent: I have reviewed the patients History and Physical, chart, labs and discussed the procedure including the risks, benefits and alternatives for the proposed anesthesia with the patient or authorized representative who has indicated his/her understanding and acceptance.  ? ? ? ?Dental Advisory Given ? ?Plan Discussed with: CRNA ? ?Anesthesia Plan Comments:   ? ? ? ? ? ? ?Anesthesia Quick Evaluation ? ?

## 2021-11-06 NOTE — Transfer of Care (Signed)
Immediate Anesthesia Transfer of Care Note ? ?Patient: Nicole Horn ? ?Procedure(s) Performed: CATARACT EXTRACTION PHACO AND INTRAOCULAR LENS PLACEMENT (IOC) (Left: Eye) ? ?Patient Location: PACU and Short Stay ? ?Anesthesia Type:MAC ? ?Level of Consciousness: drowsy and patient cooperative ? ?Airway & Oxygen Therapy: Patient Spontanous Breathing ? ?Post-op Assessment: Report given to RN and Post -op Vital signs reviewed and stable ? ?Post vital signs: Reviewed and stable ? ?Last Vitals:  ?Vitals Value Taken Time  ?BP 134/60 11/06/21 0924  ?Temp    ?Pulse 71 11/06/21 0924  ?Resp 16 11/06/21 0924  ?SpO2 97 % 11/06/21 0924  ? ? ?Last Pain:  ?Vitals:  ? 11/06/21 0924  ?TempSrc:   ?PainSc: 0-No pain  ?   ? ?  ? ?Complications: No notable events documented. ?

## 2021-11-06 NOTE — Anesthesia Procedure Notes (Signed)
Date/Time: 11/06/2021 9:00 AM ?Performed by: Vista Deck, CRNA ?Pre-anesthesia Checklist: Patient identified, Emergency Drugs available, Suction available, Timeout performed and Patient being monitored ?Patient Re-evaluated:Patient Re-evaluated prior to induction ?Oxygen Delivery Method: Nasal Cannula ? ? ? ? ?

## 2021-11-06 NOTE — Therapy (Incomplete)
?OUTPATIENT PHYSICAL THERAPY TREATMENT NOTE ? ? ?Patient Name: Nicole Horn ?MRN: [id number] ?DOB:[birth year], 76 y.o., female ?Today's Date: 11/06/2021 ? ?PCP: Renee Rival, FNP ?REFERRING PROVIDER: Renee Rival, FNP ? ? ? ? ?Past Medical History:  ?Diagnosis Date  ? 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 Chronic thumb pain, right   ? Essential hypertension   ? Heart attack [facility]) 2004  ? [place] - hospitalized with stress test by report and presumably medical therapy; subsequent cardiac catheterization (no PCI)  ? History of COVID-19 08/2019  ? History of irregular heartbeat   ? History of syphilis   ? Mixed hyperlipidemia   ? PAD (peripheral artery disease) ([place]) 2019  ? PTCA/stent [place]  ? Pneumonia due to COVID-19 virus 09/10/2019  ? Sleep apnea   ? Pt supposed to wear CPAP, but doesn't.  ? Type 2 diabetes mellitus ([place])   ? ?Past Surgical History:  ?Procedure Laterality Date  ? ABDOMINAL HYSTERECTOMY    ? BACK SURGERY    ? cervical fuision  ? BARTHOLIN GLAND CYST EXCISION Left 03/04/2015  ? Procedure: EXCISION OF LEFT BARTHOLINS TUMOR;  Surgeon: [name], MD;  Location: [facility];  Service: Gynecology;  Laterality: Left;  ? CATARACT EXTRACTION W/PHACO Right 03/27/2021  ? Procedure: CATARACT EXTRACTION PHACO AND INTRAOCULAR LENS PLACEMENT (IOC);  Surgeon: Baruch Goldmann, MD;  Location: [facility];  Service: Ophthalmology;  Laterality: Right;  cde 8.45  ? CERVICAL SPINE SURGERY    ? COLONOSCOPY N/A 02/01/2020  ? Procedure: COLONOSCOPY;  Surgeon: Daneil Dolin, MD; Scattered medium mouth diverticula in the sigmoid and descending colon, otherwise normal exam.  ? CYST REMOVAL TRUNK    ? ESOPHAGOGASTRODUODENOSCOPY (EGD) WITH PROPOFOL N/A 03/27/2020  ? Procedure: ESOPHAGOGASTRODUODENOSCOPY (EGD) WITH PROPOFOL;  Surgeon: Daneil Dolin, MD;  [facility];  Service: Endoscopy;  Laterality: N/A;  1:00pm  ? GIVENS CAPSULE STUDY N/A 03/27/2020  ? Procedure: GIVENS CAPSULE  STUDY;  Surgeon: Daneil Dolin, MD;  [facility];  Service: Endoscopy;  Laterality: N/A;  ? Multiple cyst removal surgeries    ? Stent of lower extremities    ? ?Patient Active Problem List  ? Diagnosis Date Noted  ? Concussion with no loss of consciousness 09/22/2021  ? Persistent headaches 09/22/2021  ? Frequent headaches 09/22/2021  ? Prediabetes 09/22/2021  ? LFTs abnormal 02/06/2021  ? Herpes 01/02/2021  ? Palpitations 11/27/2020  ? Depression, major, single episode, severe ([place]) 10/22/2020  ? Insomnia 10/22/2020  ? Caregiver role strain 07/23/2020  ? Primary osteoarthritis of first carpometacarpal joint of right hand 07/16/2020  ? Annual visit for general adult medical examination with abnormal findings 04/16/2020  ? Postmenopausal 04/16/2020  ? Screening mammogram, encounter for 04/16/2020  ? Atypical mole 04/16/2020  ? Need for immunization against influenza 04/16/2020  ? Constipation 03/17/2020  ? PVD (peripheral vascular disease) ([place]) 03/14/2020  ? CAD (coronary artery disease) 03/14/2020  ? Iron deficiency anemia 03/14/2020  ? DOE (dyspnea on exertion) 03/10/2020  ? H/O adenomatous polyp of colon 01/02/2020  ? FH: colon cancer 01/02/2020  ? Heart disease 12/11/2019  ? Upper airway cough syndrome 11/13/2019  ? Overweight with body mass index (BMI) of 29 to 29.9 in adult 10/16/2019  ? Essential hypertension 09/10/2019  ? Hyperlipemia 09/10/2019  ? ? ?REFERRING DIAG: dizziness/vertigo ? ?THERAPY DIAG:  ?No diagnosis found. ? ?PERTINENT HISTORY: HTN, DM ? ?PRECAUTIONS: fall ? ?SUBJECTIVE: Patient states she has been compliant with HEP,  on arrival to PT today she is feeling lightheaded and states she has not eaten breakfast but did take her medications.  She has a snack bar in her purse and eats that prior to starting physical therapy. She feels back to normal after eating.  ?PAIN:  ?Are you having pain? No ? ? ? ? ?TODAY'S TREATMENT:  ?Seated Horizontal Smooth Pursuit  x 10 reps ?Seated  Vertical Smooth Pursuit  10 reps ?seated Gaze Stabilization with Two Near Targets and Head Rotation - 10 reps ?Seated Gaze Stabilization with Head Nod and Vertical Arm Movement -10 reps ? ?Exercises ?Standing Single Leg Stance with Counter Support -2 x 10 sec ?Standing Tandem Balance with Counter Support - 2 x 10 sec, 1 x 10 sec with eyes closed ? ?Combined SLS and horizontal head turns 2 sets of 10 ?Combined tandem stance with horizontal head turns 2 sets of 10 ? ?Ambulation x 2 laps around gym with gait speed changes, head turns horizontally and vertically ? ?Ambulation figure 8 walking around cones x 4 ? ?Ambulation stepping over cones x 4 ? ?* gait belt and CGA needed for safety with all balance exercises and ambulation ? ?PATIENT EDUCATION: ?Education details: HEP progression ?Person educated: Patient ?Education method: Explanation, Demonstration, Verbal cues, and Handouts ?Education comprehension: verbalized understanding and returned demonstration ? ? ?HOME EXERCISE PROGRAM: ?Access Code: [email or web address] ?URL: [email or web address] ?Date: 11/03/2021 ?Prepared by: [facility] ? ?Exercises ?Seated Horizontal Smooth Pursuit - 1 x daily - 7 x weekly - 3 sets - 10 reps ?Seated Vertical Smooth Pursuit - 1 x daily - 7 x weekly - 3 sets - 10 reps ?Narrow Stance Gaze Stabilization with Two Near Targets and Head Rotation - 1 x daily - 7 x weekly - 3 sets - 10 reps ?Seated Gaze Stabilization with Head Nod and Vertical Arm Movement - 1 x daily - 7 x weekly - 3 sets - 10 reps ? ?Access Code: [email or web address] ?URL: [email or web address] ?Date: 11/03/2021 ?Prepared by: [facility] ? ?Exercises ?Standing Single Leg Stance with Counter Support - 1 x daily - 7 x weekly - 2 sets - 3 reps - 10-30 sec hold ?Standing Tandem Balance with Counter Support - 1 x daily - 7 x weekly - 2 sets - 3 reps - 10-30 sec hold ? ? ? ? PT Short Term Goals - 10/27/21 0803   ? ?  ? PT SHORT TERM GOAL #1  ? Title Patient will be  independent in HEP to improve functional outcomes.   ? Time 2   ? Period Weeks   ? Status New   ? Target Date 11/10/21   ? ?  ?  ? ?  ? ? ? PT Long Term Goals - 10/27/21 0804   ? ?  ? PT LONG TERM GOAL #1  ? Title Patient will report at least 50% improvement in overall symptoms and/or function to demonstrate improved functional mobility   ? Time 4   ? Period Weeks   ? Status New   ? Target Date 11/24/21   ?  ? PT LONG TERM GOAL #2  ? Title Patient will meet predicted FOTO score to demonstrate improved overall function.   ? Time 4   ? Period Weeks   ? Status New   ? Target Date 11/24/21   ?  ? PT LONG TERM GOAL #3  ? Title Patient will improve DGI score from 9 to 12 demonstrating improved functional mobility and decreased fall risk to improve  patient's ability to walk in the community.   ? Baseline DGI 9   ? Time 4   ? Period Weeks   ? Status New   ? Target Date 11/24/21   ? ?  ?  ? ?  ? ? ?  Clinical Impression Statement Today's session focused on progression of HEP and gaze stabilization and balance exercise.  Therapist added combination of gaze stabilization progression exercises and balance exercises today.  Therapist also added figure 8 walking and stepping over cones to work on dynamic balance.  She needs CGA for safety with all standing exercise and needs min A x 2 times with walking today 2 laps to maintain balance as she takes a misstep but significant improvement from eval. Patient will continue to benefit from skilled therapy services to reduce deficits and improve functional level.   ?  Personal Factors and Comorbidities Age;Comorbidity 1   ?  Comorbidities HTN, DM, CAD   ?  Examination-Activity Limitations Bathing;Bed Mobility;Locomotion Level;Stand;Caring for Others;Carry;Transfers;Dressing;Sleep;Sit   ?  Examination-Participation Restrictions Church;Laundry;Cleaning;Shop;Community Activity;Meal Prep;Driving   ?  Stability/Clinical Decision Making Evolving/Moderate complexity   ?  Clinical Decision  Making Moderate   ?  Rehab Potential Good   ?  PT Frequency 2x / week   ?  PT Duration 4 weeks   ?  PT Treatment/Interventions ADLs/Self Care Home Management;Aquatic Therapy;Biofeedback;Canalith Repostioning;Traction;Moist He

## 2021-11-06 NOTE — Discharge Instructions (Signed)
Please discharge patient when stable, will follow up today with Dr. Ethin Drummond at the [facility] office immediately following discharge.  Leave shield in place until visit.  All paperwork with discharge instructions will be given at the office.  [facility] Address:  [address]  [place], [place] [address]  

## 2021-11-06 NOTE — Interval H&P Note (Signed)
History and Physical Interval Note: ? ?11/06/2021 ?8:54 AM ? ?Nicole Horn  has presented today for surgery, with the diagnosis of combined froms age related cataract; left.  The various methods of treatment have been discussed with the patient and family. After consideration of risks, benefits and other options for treatment, the patient has consented to  Procedure(s) with comments: ?CATARACT EXTRACTION PHACO AND INTRAOCULAR LENS PLACEMENT (IOC) (Left) - CDE: as a surgical intervention.  The patient's history has been reviewed, patient examined, no change in status, stable for surgery.  I have reviewed the patient's chart and labs.  Questions were answered to the patient's satisfaction.   ? ? ?Baruch Goldmann ? ? ?

## 2021-11-09 ENCOUNTER — Encounter (HOSPITAL_COMMUNITY): Payer: Self-pay | Admitting: Speech Pathology

## 2021-11-09 ENCOUNTER — Ambulatory Visit (HOSPITAL_COMMUNITY): Payer: Medicare PPO | Admitting: Physical Therapy

## 2021-11-09 ENCOUNTER — Ambulatory Visit (HOSPITAL_COMMUNITY): Payer: Medicare PPO | Admitting: Speech Pathology

## 2021-11-09 ENCOUNTER — Other Ambulatory Visit: Payer: Self-pay

## 2021-11-09 DIAGNOSIS — R2689 Other abnormalities of gait and mobility: Secondary | ICD-10-CM | POA: Diagnosis not present

## 2021-11-09 DIAGNOSIS — R413 Other amnesia: Secondary | ICD-10-CM

## 2021-11-09 DIAGNOSIS — R41841 Cognitive communication deficit: Secondary | ICD-10-CM | POA: Diagnosis not present

## 2021-11-09 DIAGNOSIS — R42 Dizziness and giddiness: Secondary | ICD-10-CM | POA: Diagnosis not present

## 2021-11-09 NOTE — Therapy (Signed)
[facility] ?[address] ?[place], [place], [address] ?Phone: [phone]   Fax:  [phone] ? ?Speech Language Pathology Treatment ? ?Patient Details  ?Name: Nicole Horn ?MRN: [id number] ?Date of Birth: Jul 28, 1946 ?Referring Provider (SLP): [name], MD ? ? ?Encounter Date: 11/09/2021 ? ? End of Session - 11/09/21 1205   ? ? Visit Number 7   ? Number of Visits 9   ? Date for SLP Re-Evaluation 11/27/21   ? Authorization Type [employer]   [employer] mcr ppo eff 08/23/2021-current  $40.00 co-pay  no ded  oop max $8,300.00/ $90.00 met  no visit limit  no co-ins  auth req via cohere  ? Authorization Time Period [employer] approved ST 8 visits 10/19/2021-11/27/2021  WUXL#[id number]   ? SLP Start Time 1035   ? SLP Stop Time  1120   ? SLP Time Calculation (min) 45 min   ? Activity Tolerance Patient tolerated treatment well   ? ?  ?  ? ?  ? ? ?Past Medical History:  ?Diagnosis Date  ? 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 Chronic thumb pain, right   ? Essential hypertension   ? Heart attack [facility]) 2004  ? [place] - hospitalized with stress test by report and presumably medical therapy; subsequent cardiac catheterization (no PCI)  ? History of COVID-19 08/2019  ? History of irregular heartbeat   ? History of syphilis   ? Mixed hyperlipidemia   ? PAD (peripheral artery disease) ([place]) 2019  ? PTCA/stent [place]  ? Pneumonia due to COVID-19 virus 09/10/2019  ? Sleep apnea   ? Pt supposed to wear CPAP, but doesn't.  ? Type 2 diabetes mellitus ([place])   ? ? ?Past Surgical History:  ?Procedure Laterality Date  ? ABDOMINAL HYSTERECTOMY    ? BACK SURGERY    ? cervical fuision  ? BARTHOLIN GLAND CYST EXCISION Left 03/04/2015  ? Procedure: EXCISION OF LEFT BARTHOLINS TUMOR;  Surgeon: [name], MD;  Location: [facility];  Service: Gynecology;  Laterality: Left;  ? CATARACT EXTRACTION W/PHACO Right 03/27/2021  ? Procedure: CATARACT EXTRACTION PHACO AND INTRAOCULAR LENS  PLACEMENT (IOC);  Surgeon: Baruch Goldmann, MD;  Location: [facility];  Service: Ophthalmology;  Laterality: Right;  cde 8.45  ? CERVICAL SPINE SURGERY    ? COLONOSCOPY N/A 02/01/2020  ? Procedure: COLONOSCOPY;  Surgeon: Daneil Dolin, MD; Scattered medium mouth diverticula in the sigmoid and descending colon, otherwise normal exam.  ? CYST REMOVAL TRUNK    ? ESOPHAGOGASTRODUODENOSCOPY (EGD) WITH PROPOFOL N/A 03/27/2020  ? Procedure: ESOPHAGOGASTRODUODENOSCOPY (EGD) WITH PROPOFOL;  Surgeon: Daneil Dolin, MD;  [facility];  Service: Endoscopy;  Laterality: N/A;  1:00pm  ? GIVENS CAPSULE STUDY N/A 03/27/2020  ? Procedure: GIVENS CAPSULE STUDY;  Surgeon: Daneil Dolin, MD;  [facility];  Service: Endoscopy;  Laterality: N/A;  ? Multiple cyst removal surgeries    ? Stent of lower extremities    ? ? ?There were no vitals filed for this visit. ? ? Subjective Assessment - 11/09/21 1108   ? ? Subjective "I feel like I am really getting myself together."   ? Currently in Pain? No/denies   ? ?  ?  ? ?  ? ? ? ? ADULT SLP TREATMENT - 11/09/21 0001   ? ?  ? General Information  ? Behavior/Cognition Alert;Cooperative;Pleasant mood   ? Patient Positioning Upright in chair   ? Oral care provided N/A   ?  HPI Nicole Horn,  is a 76 y.o. female who presents for evaluation of a head injury that occurred on 08/18/21 when she hit the L side of her head on a display at [employer] while bending over to pick something up.  She was seen at the [facility] ED on 08/21/21 w/ c/o con't HA and was later seen by her PCP on 09/22/21 w/ the same c/o. Pt was referred for cognitive rehab by Dr. [name] due to post concussion symptoms of impaired attention, memory, and word finding. Head CT completed on 08/21/21 was unremarkable. She recently started taking nortriptyline, which has helped her headaches and sleep.   ?  ? Treatment Provided  ? Treatment provided Cognitive-Linquistic   ?  ? Pain Assessment  ? Pain  Assessment No/denies pain   ?  ? Cognitive-Linquistic Treatment  ? Treatment focused on Cognition;Patient/family/caregiver education   ? Skilled Treatment SLP provided education on ways to implement suggestions discussed in therapy to combat mental fatigue and promote cognitive efficiency (taking advantage of her "peak" times, pacing herself, alternating challenging tasks with less challenging, planning every day and referring back to plan and adjusting as needed, and incorporating rest and exercise).  ?  ? Assessment / Recommendations / Plan  ? Plan Continue with current plan of care   ?  ? Progression Toward Goals  ? Progression toward goals Progressing toward goals   ? ?  ?  ? ?  ? ? ? SLP Education - 11/09/21 1156   ? ? Education Details will delay our last session to use on April 5   ? Person(s) Educated Patient   ? Methods Explanation   ? Comprehension Verbalized understanding   ? ?  ?  ? ?  ? ? ? SLP Short Term Goals - 11/09/21 1218   ? ?  ? SLP SHORT TERM GOAL #1  ? Title Pt will complete selective and alternating attention tasks (moderately complex) with 90% acc and min cues.   ? Baseline ~80%   ? Time 4   ? Status On-going   ? Target Date 11/26/21   ?  ? SLP SHORT TERM GOAL #2  ? Title Pt will increase recall for requested information to 95% acc during functional memory exercises with use of compensatory strategies as needed when provided min cues.   ? Baseline 75%   ? Time 4   ? Period Weeks   ? Status On-going   ? Target Date 11/26/21   ?  ? SLP SHORT TERM GOAL #3  ? Title Pt will utilize external memory strategies in home environment by recording 3 items daily in planner, notebook, daily memory writing task daily for 5/7 days   ? Baseline Pt has system but is still paying bills late and missing appointments   ? Time 4   ? Period Weeks   ? Status On-going   ? Target Date 11/26/21   ?  ? SLP SHORT TERM GOAL #4  ? Title Pt will plan and execute hypothetical problem solving tasks that involve multitasking  with 95% acc with min assist from SLP for strategies.   ? Baseline mod assist   ? Time 4   ? Period Weeks   ? Status On-going   ? Target Date 11/26/21   ?  ? SLP SHORT TERM GOAL #5  ? Title Pt will verbally express complex thoughts with use of word finding strategies as needed when describing previous events/activities, "how to" scenarios, and future  plans with 90% effectiveness as judged by SLP and Pt through self-evaluation and audio recording when provided min cues.   ? Baseline circumlocution, dysnomia, losing train of thought   ? Time 4   ? Status On-going   ? Target Date 11/26/21   ? ?  ?  ? ?  ? ? ? ? ? Plan - 11/09/21 1209   ? ? Clinical Impression Statement Pt continues to report feeling positive about her recovery. She is getting to bed earlier and waking up earlier and being more productive in the mornings. She ordered a calendar she likes using for her appointments. She was reminded to refer back to her "lists" as we made one in session last week, that she did not use over the week end. Will see Pt for her final visit April 5.  ? Speech Therapy Frequency 1x /week   ? Duration 2 weeks   ? Treatment/Interventions Compensatory strategies;Patient/family education;Cueing hierarchy;SLP instruction and feedback;Compensatory techniques;Cognitive reorganization;Functional tasks   ? Potential to Achieve Goals Good   ? Potential Considerations Ability to learn/carryover information   ? SLP Home Exercise Plan Pt will completed HEP as assigned to facilitate carryover of treatment strategies and techniques in home environment with use of written cues as needed.   ? Consulted and Agree with Plan of Care Patient   ? ?  ?  ? ?  ? ? ?Patient will benefit from skilled therapeutic intervention in order to improve the following deficits and impairments:   ?Cognitive communication deficit ? ?Memory change ? ? ? ?Problem List ?Patient Active Problem List  ? Diagnosis Date Noted  ? Concussion with no loss of consciousness  09/22/2021  ? Persistent headaches 09/22/2021  ? Frequent headaches 09/22/2021  ? Prediabetes 09/22/2021  ? LFTs abnormal 02/06/2021  ? Herpes 01/02/2021  ? Palpitations 11/27/2020  ? Depression, major, single episode,

## 2021-11-09 NOTE — Therapy (Signed)
?OUTPATIENT PHYSICAL THERAPY TREATMENT NOTE ? ? ?Patient Name: Nicole Horn ?MRN: [id number] ?DOB:12-Feb-1946, 76 y.o., female ?Today's Date: 11/09/2021 ? ?PCP: Renee Rival, FNP ?REFERRING PROVIDER: Renee Rival, FNP ? ? PT End of Session - 11/09/21 1003   ? ? Visit Number 4   ? Number of Visits 8   ? Date for PT Re-Evaluation 11/24/21   ? Authorization Type [employer]- no co insurance, co-pay 49, Ded 350, no VL   ? PT Start Time 1000   ? PT Stop Time 1038   ? PT Time Calculation (min) 38 min   ? Activity Tolerance Patient tolerated treatment well   ? ?  ?  ? ?  ? ? ?Past Medical History:  ?Diagnosis Date  ? 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 Chronic thumb pain, right   ? Essential hypertension   ? Heart attack [facility]) 2004  ? [place] - hospitalized with stress test by report and presumably medical therapy; subsequent cardiac catheterization (no PCI)  ? History of COVID-19 08/2019  ? History of irregular heartbeat   ? History of syphilis   ? Mixed hyperlipidemia   ? PAD (peripheral artery disease) ([place]) 2019  ? PTCA/stent [place]  ? Pneumonia due to COVID-19 virus 09/10/2019  ? Sleep apnea   ? Pt supposed to wear CPAP, but doesn't.  ? Type 2 diabetes mellitus ([place])   ? ?Past Surgical History:  ?Procedure Laterality Date  ? ABDOMINAL HYSTERECTOMY    ? BACK SURGERY    ? cervical fuision  ? BARTHOLIN GLAND CYST EXCISION Left 03/04/2015  ? Procedure: EXCISION OF LEFT BARTHOLINS TUMOR;  Surgeon: [name], MD;  Location: [facility];  Service: Gynecology;  Laterality: Left;  ? CATARACT EXTRACTION W/PHACO Right 03/27/2021  ? Procedure: CATARACT EXTRACTION PHACO AND INTRAOCULAR LENS PLACEMENT (IOC);  Surgeon: Baruch Goldmann, MD;  Location: [facility];  Service: Ophthalmology;  Laterality: Right;  cde 8.45  ? CERVICAL SPINE SURGERY    ? COLONOSCOPY N/A 02/01/2020  ? Procedure: COLONOSCOPY;  Surgeon: Daneil Dolin, MD; Scattered medium mouth diverticula in the sigmoid and descending  colon, otherwise normal exam.  ? CYST REMOVAL TRUNK    ? ESOPHAGOGASTRODUODENOSCOPY (EGD) WITH PROPOFOL N/A 03/27/2020  ? Procedure: ESOPHAGOGASTRODUODENOSCOPY (EGD) WITH PROPOFOL;  Surgeon: Daneil Dolin, MD;  [facility];  Service: Endoscopy;  Laterality: N/A;  1:00pm  ? GIVENS CAPSULE STUDY N/A 03/27/2020  ? Procedure: GIVENS CAPSULE STUDY;  Surgeon: Daneil Dolin, MD;  [facility];  Service: Endoscopy;  Laterality: N/A;  ? Multiple cyst removal surgeries    ? Stent of lower extremities    ? ?Patient Active Problem List  ? Diagnosis Date Noted  ? Concussion with no loss of consciousness 09/22/2021  ? Persistent headaches 09/22/2021  ? Frequent headaches 09/22/2021  ? Prediabetes 09/22/2021  ? LFTs abnormal 02/06/2021  ? Herpes 01/02/2021  ? Palpitations 11/27/2020  ? Depression, major, single episode, severe ([place]) 10/22/2020  ? Insomnia 10/22/2020  ? Caregiver role strain 07/23/2020  ? Primary osteoarthritis of first carpometacarpal joint of right hand 07/16/2020  ? Annual visit for general adult medical examination with abnormal findings 04/16/2020  ? Postmenopausal 04/16/2020  ? Screening mammogram, encounter for 04/16/2020  ? Atypical mole 04/16/2020  ? Need for immunization against influenza 04/16/2020  ? Constipation 03/17/2020  ? PVD (peripheral vascular disease) ([place]) 03/14/2020  ? CAD (coronary artery disease) 03/14/2020  ? Iron deficiency anemia 03/14/2020  ?  DOE (dyspnea on exertion) 03/10/2020  ? H/O adenomatous polyp of colon 01/02/2020  ? FH: colon cancer 01/02/2020  ? Heart disease 12/11/2019  ? Upper airway cough syndrome 11/13/2019  ? Overweight with body mass index (BMI) of 29 to 29.9 in adult 10/16/2019  ? Essential hypertension 09/10/2019  ? Hyperlipemia 09/10/2019  ? ? ?REFERRING DIAG: dizziness/vertigo ? ?THERAPY DIAG:  ?Dizziness and giddiness ? ?Other abnormalities of gait and mobility ? ?PERTINENT HISTORY: HTN, DM ? ?PRECAUTIONS: fall ? ?SUBJECTIVE: Pt arrived  late for appt today.  Reports no pain or issues and only one dizziness episode over the weekend. ? ?PAIN:  ?Are you having pain? No ? ? ? ? ?TODAY'S TREATMENT:  ? ?11/09/21 ? ?Exercises ?Heelraises 20 reps for warm up ?Standing Single Leg Stance without UE max 24? Lt, 30? Rt ?Standing Tandem Balance without support 30? each LE lead ?Combined tandem stance with horizontal head turns 2 sets of 10 ?  ?Ambulation x 2 laps around gym with gait speed changes, head turns horizontally and vertically ?Fwd and backward tandem gait 3RT each on blue line ?Ambulation figure 8 walking around cones x 4 ?Ambulation stepping over hurdles fwd and lateral 12?& 6? 5RT in // bars no UE  ? ?Red TB: paloff 10X with NBOS, single leg stance with shoulder extension 10X each ? ?DGI walkthrough clinic 1RT ? ?11/06/21 ?Seated Horizontal Smooth Pursuit  x 10 reps ?Seated Vertical Smooth Pursuit  10 reps ?seated Gaze Stabilization with Two Near Targets and Head Rotation - 10 reps ?Seated Gaze Stabilization with Head Nod and Vertical Arm Movement -10 reps ? ?Exercises ?Standing Single Leg Stance with Counter Support -2 x 10 sec ?Standing Tandem Balance with Counter Support - 2 x 10 sec, 1 x 10 sec with eyes closed ? ?Combined SLS and horizontal head turns 2 sets of 10 ?Combined tandem stance with horizontal head turns 2 sets of 10 ? ?Ambulation x 2 laps around gym with gait speed changes, head turns horizontally and vertically ? ?Ambulation figure 8 walking around cones x 4 ? ?Ambulation stepping over cones x 4 ? ?* gait belt and CGA needed for safety with all balance exercises and ambulation ? ?PATIENT EDUCATION: ?Education details: HEP progression; awareness of positional changes and how these affect balance ?Person educated: Patient ?Education method: Veterinary surgeon, demonstration ?Education comprehension: verbalized understanding and returned demonstration ? ? ?HOME EXERCISE PROGRAM: ?Access Code: [email or web address] ?URL:  [email or web address] ?Date: 11/03/2021 ?Prepared by: [facility] ? ?Exercises ?Seated Horizontal Smooth Pursuit - 1 x daily - 7 x weekly - 3 sets - 10 reps ?Seated Vertical Smooth Pursuit - 1 x daily - 7 x weekly - 3 sets - 10 reps ?Narrow Stance Gaze Stabilization with Two Near Targets and Head Rotation - 1 x daily - 7 x weekly - 3 sets - 10 reps ?Seated Gaze Stabilization with Head Nod and Vertical Arm Movement - 1 x daily - 7 x weekly - 3 sets - 10 reps ? ?Access Code: [email or web address] ?URL: [email or web address] ?Date: 11/03/2021 ?Prepared by: [facility] ? ?Exercises ?Standing Single Leg Stance with Counter Support - 1 x daily - 7 x weekly - 2 sets - 3 reps - 10-30 sec hold ?Standing Tandem Balance with Counter Support - 1 x daily - 7 x weekly - 2 sets - 3 reps - 10-30 sec hold ? ? ? ? PT Short Term Goals - 10/27/21 0803   ? ?  ? PT SHORT TERM GOAL #1  ? Title  Patient will be independent in HEP to improve functional outcomes.   ? Time 2   ? Period Weeks   ? Status New   ? Target Date 11/10/21   ? ?  ?  ? ?  ? ? ? PT Long Term Goals - 10/27/21 0804   ? ?  ? PT LONG TERM GOAL #1  ? Title Patient will report at least 50% improvement in overall symptoms and/or function to demonstrate improved functional mobility   ? Time 4   ? Period Weeks   ? Status New   ? Target Date 11/24/21   ?  ? PT LONG TERM GOAL #2  ? Title Patient will meet predicted FOTO score to demonstrate improved overall function.   ? Time 4   ? Period Weeks   ? Status New   ? Target Date 11/24/21   ?  ? PT LONG TERM GOAL #3  ? Title Patient will improve DGI score from 9 to 12 demonstrating improved functional mobility and decreased fall risk to improve patient's ability to walk in the community.   ? Baseline DGI 9   ? Time 4   ? Period Weeks   ? Status New   ? Target Date 11/24/21   ? ?  ?  ? ?  ? ? ?  Clinical Impression Statement Today's session focused on continuation of balance and stabilization activities. No challenge/LOB  with tandem stance and head movements. Increased stepping challenge with addition of 12" hurdles, cues to step over rather than around.  Noted fatigue with activities requiring several standing rest breaks during session.    Pt did n

## 2021-11-10 ENCOUNTER — Encounter (HOSPITAL_COMMUNITY): Payer: Self-pay | Admitting: Ophthalmology

## 2021-11-11 ENCOUNTER — Encounter (HOSPITAL_COMMUNITY): Payer: Self-pay

## 2021-11-11 ENCOUNTER — Other Ambulatory Visit: Payer: Self-pay

## 2021-11-11 ENCOUNTER — Ambulatory Visit (HOSPITAL_COMMUNITY): Payer: Medicare PPO

## 2021-11-11 ENCOUNTER — Ambulatory Visit (HOSPITAL_COMMUNITY): Payer: Medicare PPO | Admitting: Speech Pathology

## 2021-11-11 DIAGNOSIS — R41841 Cognitive communication deficit: Secondary | ICD-10-CM | POA: Diagnosis not present

## 2021-11-11 DIAGNOSIS — R2689 Other abnormalities of gait and mobility: Secondary | ICD-10-CM

## 2021-11-11 DIAGNOSIS — R42 Dizziness and giddiness: Secondary | ICD-10-CM

## 2021-11-11 DIAGNOSIS — R413 Other amnesia: Secondary | ICD-10-CM | POA: Diagnosis not present

## 2021-11-11 NOTE — Therapy (Signed)
OUTPATIENT PHYSICAL THERAPY TREATMENT NOTE   Patient Name: Nicole Horn MRN: [id number] DOB:07/20/1946, 77 y.o., female Today's Date: 11/11/2021  PCP: Donell Beers, FNP REFERRING PROVIDER: Donell Beers, FNP   PT End of Session - 11/11/21 1003     Visit Number 5    Number of Visits 8    Date for PT Re-Evaluation 11/24/21    Authorization Type [employer] Cho- no co insurance, co-pay 40, Ded 350, no VL    PT Start Time 1000    PT Stop Time 1030    PT Time Calculation (min) 30 min    Equipment Utilized During Treatment Gait belt              Past Medical History:  Diagnosis Date   Benign tumor of Bartholin's gland 02/26/2015   Chronic thumb pain, right    Essential hypertension    Heart attack ([facility]) 2004   [place] - hospitalized with stress test by report and presumably medical therapy; subsequent cardiac catheterization (no PCI)   History of COVID-19 08/2019   History of irregular heartbeat    History of syphilis    Mixed hyperlipidemia    PAD (peripheral artery disease) ([facility]) 2019   PTCA/stent [place]   Pneumonia due to COVID-19 virus 09/10/2019   Sleep apnea    Pt supposed to wear CPAP, but doesn't.   Type 2 diabetes mellitus ([facility])    Past Surgical History:  Procedure Laterality Date   ABDOMINAL HYSTERECTOMY     BACK SURGERY     cervical fuision   BARTHOLIN GLAND CYST EXCISION Left 03/04/2015   Procedure: EXCISION OF LEFT BARTHOLINS TUMOR;  Surgeon: Tilda Burrow, MD;  Location: [facility];  Service: Gynecology;  Laterality: Left;   CATARACT EXTRACTION W/PHACO Right 03/27/2021   Procedure: CATARACT EXTRACTION PHACO AND INTRAOCULAR LENS PLACEMENT (IOC);  Surgeon: Fabio Pierce, MD;  Location: [facility];  Service: Ophthalmology;  Laterality: Right;  cde 8.45   CATARACT EXTRACTION W/PHACO Left 11/06/2021   Procedure: CATARACT EXTRACTION PHACO AND INTRAOCULAR LENS PLACEMENT (IOC);  Surgeon: Fabio Pierce, MD;  Location: [facility];  Service:  Ophthalmology;  Laterality: Left;  CDE:13.69   CERVICAL SPINE SURGERY     COLONOSCOPY N/A 02/01/2020   Procedure: COLONOSCOPY;  Surgeon: Corbin Ade, MD; Scattered medium mouth diverticula in the sigmoid and descending colon, otherwise normal exam.   CYST REMOVAL TRUNK     ESOPHAGOGASTRODUODENOSCOPY (EGD) WITH PROPOFOL N/A 03/27/2020   Procedure: ESOPHAGOGASTRODUODENOSCOPY (EGD) WITH PROPOFOL;  Surgeon: Corbin Ade, MD;  [facility];  Service: Endoscopy;  Laterality: N/A;  1:00pm   GIVENS CAPSULE STUDY N/A 03/27/2020   Procedure: GIVENS CAPSULE STUDY;  Surgeon: Corbin Ade, MD;  Location: [facility];  Service: Endoscopy;  Laterality: N/A;   Multiple cyst removal surgeries     Stent of lower extremities     Patient Active Problem List   Diagnosis Date Noted   Concussion with no loss of consciousness 09/22/2021   Persistent headaches 09/22/2021   Frequent headaches 09/22/2021   Prediabetes 09/22/2021   LFTs abnormal 02/06/2021   Herpes 01/02/2021   Palpitations 11/27/2020   Depression, major, single episode, severe (HCC) 10/22/2020   Insomnia 10/22/2020   Caregiver role strain 07/23/2020   Primary osteoarthritis of first carpometacarpal joint of right hand 07/16/2020   Annual visit for general adult medical examination with abnormal findings 04/16/2020   Postmenopausal 04/16/2020   Screening mammogram, encounter for 04/16/2020  Atypical mole 04/16/2020   Need for immunization against influenza 04/16/2020   Constipation 03/17/2020   PVD (peripheral vascular disease) ([facility]) 03/14/2020   CAD (coronary artery disease) 03/14/2020   Iron deficiency anemia 03/14/2020   DOE (dyspnea on exertion) 03/10/2020   H/O adenomatous polyp of colon 01/02/2020   FH: colon cancer 01/02/2020   Heart disease 12/11/2019   Upper airway cough syndrome 11/13/2019   Overweight with body mass index (BMI) of 29 to 29.9 in adult 10/16/2019   Essential hypertension 09/10/2019    Hyperlipemia 09/10/2019    REFERRING DIAG: dizziness/vertigo  THERAPY DIAG:  Dizziness and giddiness  Other abnormalities of gait and mobility  PERTINENT HISTORY: HTN, DM  PRECAUTIONS: fall  SUBJECTIVE: Pt arrived 15 min late for appt today.  Reports no pain or issues and only one dizziness episode over the weekend.  PAIN:  Are you having pain? No     TODAY'S TREATMENT:   11/11/21   Exercises Heelraises 20 reps for warm up Standing Single Leg Stance without UE max 24" Lt, 30" Rt Standing Tandem Balance without support 30" each LE lead Combined tandem stance with horizontal head turns 2 sets of 10   Ambulation x 2 laps around gym with gait speed changes, head turns horizontally and vertically *no LOB noted today Fwd and backward tandem gait 3RT each on blue line Ambulation figure 8 walking around cones x 4 Ambulation stepping over hurdles fwd and lateral 12"& 6" 5RT in // bars no UE   Red TB: paloff 10X with NBOS, single leg stance with shoulder extension 10X each   11/09/21  Exercises Heelraises 20 reps for warm up Standing Single Leg Stance without UE max 24" Lt, 30" Rt Standing Tandem Balance without support 30" each LE lead Combined tandem stance with horizontal head turns 2 sets of 10   Ambulation x 2 laps around gym with gait speed changes, head turns horizontally and vertically Fwd and backward tandem gait 3RT each on blue line Ambulation figure 8 walking around cones x 4 Ambulation stepping over hurdles fwd and lateral 12"& 6" 5RT in // bars no UE   Red TB: paloff 10X with NBOS, single leg stance with shoulder extension 10X each  DGI walkthrough clinic 1RT  11/06/21 Seated Horizontal Smooth Pursuit  x 10 reps Seated Vertical Smooth Pursuit  10 reps seated Gaze Stabilization with Two Near Targets and Head Rotation - 10 reps Seated Gaze Stabilization with Head Nod and Vertical Arm Movement -10 reps  Exercises Standing Single Leg Stance with Counter  Support -2 x 10 sec Standing Tandem Balance with Counter Support - 2 x 10 sec, 1 x 10 sec with eyes closed  Combined SLS and horizontal head turns 2 sets of 10 Combined tandem stance with horizontal head turns 2 sets of 10  Ambulation x 2 laps around gym with gait speed changes, head turns horizontally and vertically  Ambulation figure 8 walking around cones x 4  Ambulation stepping over cones x 4  * gait belt and CGA needed for safety with all balance exercises and ambulation  PATIENT EDUCATION: Education details: HEP progression; awareness of positional changes and how these affect balance Person educated: Patient Education method: Engineer, structural, demonstration Education comprehension: verbalized understanding and returned demonstration   HOME EXERCISE PROGRAM: Access Code: [facility] URL: [email or web address] Date: 11/03/2021 Prepared by: [facility]  Exercises Seated Horizontal Smooth Pursuit - 1 x daily - 7 x weekly - 3 sets - 10 reps Seated Vertical Smooth  Pursuit - 1 x daily - 7 x weekly - 3 sets - 10 reps Narrow Stance Gaze Stabilization with Two Near Targets and Head Rotation - 1 x daily - 7 x weekly - 3 sets - 10 reps Seated Gaze Stabilization with Head Nod and Vertical Arm Movement - 1 x daily - 7 x weekly - 3 sets - 10 reps  Access Code: [email or web address] URL: [email or web address] Date: 11/03/2021 Prepared by: [facility]  Exercises Standing Single Leg Stance with Counter Support - 1 x daily - 7 x weekly - 2 sets - 3 reps - 10-30 sec hold Standing Tandem Balance with Counter Support - 1 x daily - 7 x weekly - 2 sets - 3 reps - 10-30 sec hold     PT Short Term Goals - 10/27/21 0803       PT SHORT TERM GOAL #1   Title Patient will be independent in HEP to improve functional outcomes.    Time 2    Period Weeks    Status New    Target Date 11/10/21              PT Long Term Goals - 10/27/21 0804       PT LONG TERM GOAL #1    Title Patient will report at least 50% improvement in overall symptoms and/or function to demonstrate improved functional mobility    Time 4    Period Weeks    Status New    Target Date 11/24/21      PT LONG TERM GOAL #2   Title Patient will meet predicted FOTO score to demonstrate improved overall function.    Time 4    Period Weeks    Status New    Target Date 11/24/21      PT LONG TERM GOAL #3   Title Patient will improve DGI score from 9 to 12 demonstrating improved functional mobility and decreased fall risk to improve patient's ability to walk in the community.    Baseline DGI 9    Time 4    Period Weeks    Status New    Target Date 11/24/21               Clinical Impression Statement Today's session focused on continuation of balance and stabilization activities. Patient is able to walk 2 laps in the gym today with head turns, speed changes without any LOB;  improved from previous sessions. She does need CGA for SLS shoulder extension today but likely demonstrates difficulty due to fatigue.  Patient fatigued after treatment.  Patient will continue to benefit from skilled therapy services to reduce deficits and improve functional level.     Personal Factors and Comorbidities Age;Comorbidity 1     Comorbidities HTN, DM, CAD     Examination-Activity Limitations Bathing;Bed Mobility;Locomotion Level;Stand;Caring for Others;Carry;Transfers;Dressing;Sleep;Sit     Examination-Participation Restrictions Church;Laundry;Cleaning;Shop;Community Activity;Meal Prep;Driving     Stability/Clinical Decision Making Evolving/Moderate complexity     Clinical Decision Making Moderate     Rehab Potential Good     PT Frequency 2x / week     PT Duration 4 weeks     PT Treatment/Interventions ADLs/Self Care Home Management;Aquatic Therapy;Biofeedback;Canalith Repostioning;Traction;Moist Heat;Electrical Stimulation;Iontophoresis 4mg /ml Dexamethasone;Functional mobility training;Stair  training;Cryotherapy;Gait training;DME Instruction;Therapeutic activities;Therapeutic exercise;Balance training;Neuromuscular re-education;Orthotic Fit/Training;Patient/family education;Cognitive remediation;Wheelchair mobility training;Manual techniques;Taping;Splinting;Energy conservation;Dry needling;Passive range of motion;Scar mobilization;Vestibular;Visual/perceptual remediation/compensation;Spinal Manipulations;Joint Manipulations     PT Next Visit Plan Continue work on dynamic standing balance, combine gaze stabilization with standing balance exercise; add UE  reaching activity to challege balance next Rx. 3 visits remain on current orders; patient going out of town to [facility] next week.      10:35 AM, 11/11/21 Maya Arcand Small Kendra Woolford MPT [facility] [place] [phone]

## 2021-11-12 ENCOUNTER — Ambulatory Visit: Payer: Medicare PPO | Admitting: Family Medicine

## 2021-11-15 ENCOUNTER — Other Ambulatory Visit: Payer: Self-pay | Admitting: Family Medicine

## 2021-11-16 NOTE — Telephone Encounter (Signed)
Rx refill request approved per Dr. Corey's orders. 

## 2021-11-24 ENCOUNTER — Ambulatory Visit: Payer: Medicare PPO | Admitting: Family Medicine

## 2021-11-24 VITALS — BP 132/80 | HR 88 | Ht 60.0 in | Wt 165.0 lb

## 2021-11-24 DIAGNOSIS — F630 Pathological gambling: Secondary | ICD-10-CM

## 2021-11-24 DIAGNOSIS — R519 Headache, unspecified: Secondary | ICD-10-CM | POA: Diagnosis not present

## 2021-11-24 DIAGNOSIS — S060X0D Concussion without loss of consciousness, subsequent encounter: Secondary | ICD-10-CM | POA: Diagnosis not present

## 2021-11-24 DIAGNOSIS — F322 Major depressive disorder, single episode, severe without psychotic features: Secondary | ICD-10-CM

## 2021-11-24 NOTE — Patient Instructions (Addendum)
Good to see you  ? ?Follow up in 1 month.  ? ?Gamblers Anonymous  ? ?[place] Hotline Number: [phone] ?[place] Number: [phone] ? ?[place] [employer] Closed Meeting Monday 7:15 PM [facility] ?[place] , [place], [address] 24.8 miles  ?[place] Hotline Number: [phone] ?Print ?[employer] Closed Meeting Thursday 7:15 PM [facility] ?[place], [place], [address] 40.7 miles  ?[place] Hotline Number: [phone] ?

## 2021-11-24 NOTE — Therapy (Signed)
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   Type 2 diabetes mellitus (HCC)    Past Surgical History:  Procedure Laterality Date   ABDOMINAL HYSTERECTOMY     BACK SURGERY     cervical fuision   BARTHOLIN GLAND CYST EXCISION Left 03/04/2015   Procedure: EXCISION OF LEFT BARTHOLINS TUMOR;  Surgeon: Tilda Burrow, MD;  Location: [facility];  Service: Gynecology;  Laterality: Left;   CATARACT EXTRACTION W/PHACO Right 03/27/2021   Procedure: CATARACT EXTRACTION PHACO AND INTRAOCULAR LENS PLACEMENT (IOC);  Surgeon: Fabio Pierce, MD;  Location: [facility];  Service: Ophthalmology;  Laterality: Right;  cde 8.45   CATARACT EXTRACTION W/PHACO Left 11/06/2021   Procedure: CATARACT EXTRACTION PHACO AND INTRAOCULAR LENS PLACEMENT (IOC);  Surgeon: Fabio Pierce, MD;  Location: [facility];  Service:  Ophthalmology;  Laterality: Left;  CDE:13.69   CERVICAL SPINE SURGERY     COLONOSCOPY N/A 02/01/2020   Procedure: COLONOSCOPY;  Surgeon: Corbin Ade, MD; Scattered medium mouth diverticula in the sigmoid and descending colon, otherwise normal exam.   CYST REMOVAL TRUNK     ESOPHAGOGASTRODUODENOSCOPY (EGD) WITH PROPOFOL N/A 03/27/2020   Procedure: ESOPHAGOGASTRODUODENOSCOPY (EGD) WITH PROPOFOL;  Surgeon: Corbin Ade, MD;  Location[facility];  Service: Endoscopy;  Laterality: N/A;  1:00pm   GIVENS CAPSULE STUDY N/A 03/27/2020   Procedure: GIVENS CAPSULE STUDY;  Surgeon: Corbin Ade, MD;  Location: [facility];  Service: Endoscopy;  Laterality: N/A;   Multiple cyst removal surgeries     Stent of lower extremities     Patient Active Problem List   Diagnosis Date Noted   Compulsive gambling 11/25/2021   Concussion with no loss of consciousness 09/22/2021   Persistent headaches 09/22/2021   Frequent headaches 09/22/2021   Prediabetes 09/22/2021   LFTs abnormal 02/06/2021   Herpes 01/02/2021   Palpitations 11/27/2020   Depression, major, single episode, severe (HCC) 10/22/2020   Insomnia 10/22/2020   Caregiver role strain 07/23/2020   Primary osteoarthritis of first carpometacarpal joint of right hand 07/16/2020   Annual visit for general adult medical examination with abnormal findings 04/16/2020   Postmenopausal 04/16/2020   Screening  mammogram, encounter for 04/16/2020   Atypical mole 04/16/2020   Need for immunization against influenza 04/16/2020   Constipation 03/17/2020   PVD (peripheral vascular disease) (HCC) 03/14/2020   CAD (coronary artery disease) 03/14/2020   Iron deficiency anemia 03/14/2020   DOE (dyspnea on exertion) 03/10/2020   H/O adenomatous polyp of colon 01/02/2020   FH: colon cancer 01/02/2020   Heart disease 12/11/2019   Upper airway cough syndrome 11/13/2019   Overweight with body mass index (BMI) of 29 to 29.9 in adult 10/16/2019    Essential hypertension 09/10/2019   Hyperlipemia 09/10/2019    REFERRING DIAG: dizziness/vertigo  THERAPY DIAG:  Dizziness and giddiness  Other abnormalities of gait and mobility  PERTINENT HISTORY: HTN, DM  PRECAUTIONS: fall  SUBJECTIVE: Pt arrived 14 min late for appt today. Patient returns from being out of town for several days.  No issues with dizziness while she was gone.  She reports she has been compliant with exercises at least a few days a week. She thinks maybe stress was contributing to her some of her dizziness and lightheaded issues.   PAIN:  Are you having pain? Yes, soreness generally     TODAY'S TREATMENT:   11/25/21 Nustep level 2;  seat 6; arms 8; 4 minutes Heel raises 2 x 10 8" step ups x 10 no UE assist 8" step up and overs without UE assist Standing on blue foam without UE assist x 30" Standing SLS 2 x 15" Tandem stance 2 x 30 " each 2 1 lap around the gym with head turns, speed changes          11/11/21   Exercises Heelraises 20 reps for warm up Standing Single Leg Stance without UE max 24" Lt, 30" Rt Standing Tandem Balance without support 30" each LE lead Combined tandem stance with horizontal head turns 2 sets of 10   Ambulation x 2 laps around gym with gait speed changes, head turns horizontally and vertically *no LOB noted today Fwd and backward tandem gait 3RT each on blue line Ambulation figure 8 walking around cones x 4 Ambulation stepping over hurdles fwd and lateral 12"& 6" 5RT in // bars no UE   Red TB: paloff 10X with NBOS, single leg stance with shoulder extension 10X each   11/09/21  Exercises Heelraises 20 reps for warm up Standing Single Leg Stance without UE max 24" Lt, 30" Rt Standing Tandem Balance without support 30" each LE lead Combined tandem stance with horizontal head turns 2 sets of 10   Ambulation x 2 laps around gym with gait speed changes, head turns horizontally and vertically Fwd and backward tandem  gait 3RT each on blue line Ambulation figure 8 walking around cones x 4 Ambulation stepping over hurdles fwd and lateral 12"& 6" 5RT in // bars no UE   Red TB: paloff 10X with NBOS, single leg stance with shoulder extension 10X each  DGI walkthrough clinic 1RT  11/06/21 Seated Horizontal Smooth Pursuit  x 10 reps Seated Vertical Smooth Pursuit  10 reps seated Gaze Stabilization with Two Near Targets and Head Rotation - 10 reps Seated Gaze Stabilization with Head Nod and Vertical Arm Movement -10 reps  Exercises Standing Single Leg Stance with Counter Support -2 x 10 sec Standing Tandem Balance with Counter Support - 2 x 10 sec, 1 x 10 sec with eyes closed  Combined SLS and horizontal head turns 2 sets of 10 Combined tandem stance with horizontal head turns 2 sets of 10  Ambulation x 2 laps around gym with gait speed changes, head turns horizontally and vertically  Ambulation figure 8 walking around cones x 4  Ambulation stepping over cones x 4  * gait belt and CGA needed for safety with all balance exercises and ambulation  PATIENT EDUCATION: Education details: HEP progression; awareness of positional changes and how these affect balance Person educated: Patient Education method: Engineer, structural, demonstration Education comprehension: verbalized understanding and returned demonstration   HOME EXERCISE PROGRAM: Access Code: [facility] URL: [email or web address] Date: 11/03/2021 Prepared by: [facility] - [facility]  Exercises Seated Horizontal Smooth Pursuit - 1 x daily - 7 x weekly - 3 sets - 10 reps Seated Vertical Smooth Pursuit - 1 x daily - 7 x weekly - 3 sets - 10 reps Narrow Stance Gaze Stabilization with Two Near Targets and Head Rotation - 1 x daily - 7 x weekly - 3 sets - 10 reps Seated Gaze Stabilization with Head Nod and Vertical Arm Movement - 1 x daily - 7 x weekly - 3 sets - 10 reps  Access Code: [email or web address] URL: [email or web address] Date:  11/03/2021 Prepared by: [facility]  Exercises Standing Single Leg Stance with Counter Support - 1 x daily - 7 x weekly - 2 sets - 3 reps - 10-30 sec hold Standing Tandem Balance with Counter Support - 1 x daily - 7 x weekly - 2 sets - 3 reps - 10-30 sec hold     PT Short Term Goals - 10/27/21 0803       PT SHORT TERM GOAL #1   Title Patient will be independent in HEP to improve functional outcomes.    Time 2    Period Weeks    Status New    Target Date 11/10/21              PT Long Term Goals - 10/27/21 0804       PT LONG TERM GOAL #1   Title Patient will report at least 50% improvement in overall symptoms and/or function to demonstrate improved functional mobility    Time 4    Period Weeks    Status New    Target Date 11/24/21      PT LONG TERM GOAL #2   Title Patient will meet predicted FOTO score to demonstrate improved overall function.    Time 4    Period Weeks    Status New    Target Date 11/24/21      PT LONG TERM GOAL #3   Title Patient will improve DGI score from 9 to 12 demonstrating improved functional mobility and decreased fall risk to improve patient's ability to walk in the community.    Baseline DGI 9    Time 4    Period Weeks    Status New    Target Date 11/24/21               Clinical Impression Statement Today's session focused on continued balance activities. Patient with only one mild report of dizziness today; no LOB noted . Patient making good progress.  2 visits remain on current orders and patient on track for discharge after that. Patient will continue to benefit from skilled therapy services to reduce deficits and improve functional level.     Personal Factors and Comorbidities Age;Comorbidity 1     Comorbidities HTN, DM, CAD     Examination-Activity Limitations Bathing;Bed Mobility;Locomotion Level;Stand;Caring for Others;Carry;Transfers;Dressing;Sleep;Sit     Examination-Participation Restrictions  Church;Laundry;Cleaning;Shop;Community Activity;Meal Prep;Driving  Stability/Clinical Decision Making Evolving/Moderate complexity     Clinical Decision Making Moderate     Rehab Potential Good     PT Frequency 2x / week     PT Duration 4 weeks     PT Treatment/Interventions ADLs/Self Care Home Management;Aquatic Therapy;Biofeedback;Canalith Repostioning;Traction;Moist Heat;Electrical Stimulation;Iontophoresis 4mg /ml Dexamethasone;Functional mobility training;Stair training;Cryotherapy;Gait training;DME Instruction;Therapeutic activities;Therapeutic exercise;Balance training;Neuromuscular re-education;Orthotic Fit/Training;Patient/family education;Cognitive remediation;Wheelchair mobility training;Manual techniques;Taping;Splinting;Energy conservation;Dry needling;Passive range of motion;Scar mobilization;Vestibular;Visual/perceptual remediation/compensation;Spinal Manipulations;Joint Manipulations     PT Next Visit Plan Continue work on dynamic standing balance, combine gaze stabilization with standing balance exercise; add UE reaching activity to challege balance next Rx. 2 visits remain on current orders      9:50 AM, 11/25/21 Chemika Nightengale Small Tarren Sabree MPT [place] [facility] [place] [phone] Ph:[phone]

## 2021-11-24 NOTE — Progress Notes (Signed)
Subjective:   ? ?Chief Complaint: ?I, Wendy Poet, LAT, ATC, am serving as scribe for Dr. [name]. ? ?Nicole Horn,  is a 76 y.o. female who presents for f/u concussion w/o LOC, that occurred on 08/18/21 when she hit the L side of her head on a display at [place] while bending over to pick something up.  She was seen at the [facility] ED on 08/21/21.  She was last seen by Dr. Georgina Snell on 10/22/21 and noted improvement/resolution of her HA w/ the use of nortriptyline.  She also reported con't slight lightheadedness, losing her words, and slight forgetfulness.  She was advised to con't both speech and vestibular PT and has completed 7 speech therapy and 5 PT/vestibular therapy visits.  She was also prescribed Prozac to help w/ mood stabilization.  Today, pt reports that the therapy has been very helpful especially speech therapy but she cannot afford to go as much as she is going. Patient still has problems with forgetfulness. But over all better, the medication has helped a lot for patients anxiety and sleeping.  ? ?She mentioned a little bit at the last visit and we spent much more time today talking about her gambling problem.  She spends thousands of dollars a month gambling and has trouble controlling her gambling.  She has had problems with other addictions in the past including tobacco and alcohol.  She was able to quit the successfully on her own in the past. ? ?Dx imaging: 08/21/21 Head CT ? ?Injury date : 08/18/21 ?Visit #: 3 ? ? ?History of Present Illness:  ? ? ?Concussion Self-Reported Symptom Score ?Symptoms rated on a scale 1-6, in last 24 hours ? ? Headache: 2   ? Nausea: 0 ? Dizziness: 2 ? Vomiting: 0 ? Balance Difficulty: 3  ? Trouble Falling Asleep: 2  ? Fatigue: 1 ? Sleep Less Than Usual: 2 ? Daytime Drowsiness: 2 ? Sleep More Than Usual: 4 ? Photophobia: 4 ? Phonophobia: 4 ? Irritability: 3 ? Sadness: 1 ? Numbness or Tingling: 1 ? Nervousness: 0 ? Feeling More Emotional: 3 ? Feeling  Mentally Foggy: 3 ? Feeling Slowed Down: 3 ? Memory Problems: 3 ? Difficulty Concentrating: 2 ? Visual Problems: 0 ?  ?Total # of Symptoms:18 ?Total Symptom Score: 45 ? ?Previous Total # of Symptoms: 17/22 ?Previous Symptom Score: 82/132 ? ? Neck Pain: Yes ? Tinnitus: No ? ?Review of Systems: ?No fevers or chills ? ? ? ?Review of History: ?Former tobacco use. ? ?Objective:   ? ?Physical Examination ?Vitals:  ? 11/24/21 1520  ?BP: 132/80  ?Pulse: 88  ?SpO2: 97%  ? ?MSK: Normal cervical motion ?Neuro: Normal coordination and gait ?Psych: Normal speech thought process and affect. ? ? ? ?Assessment and Plan  ? ?76 y.o. female with concussion slowly improving.  Mood is improving with Prozac.  Insomnia and headache are improving with nortriptyline.  Overall I am happy that the concussion is slowly improving with medications and with vestibular physical therapy and cognitive rehab.  Suggest that she decrease the frequency of the therapy due to cost. ? ?After discussion it seems as though Nicole Horn has a gambling addiction problem.  I think this is brought on by her concussion and her stress.  I provided resources to gamblers Anonymous spent time talking about some strategies around this.  Hopefully she will be able to access these resources and successfully quit gambling.  This does not seem to be a healthy adaptive behavior for her. ? ? ? ?  Recheck in 1 month. ? ?  ?Action/Discussion: ?Reviewed diagnosis, management options, expected outcomes, and the reasons for scheduled and emergent follow-up. Questions were adequately answered. Patient expressed verbal understanding and agreement with the following plan.    ? ?Patient Education: ?Reviewed with patient the risks (i.e, a repeat concussion, post-concussion syndrome, second-impact syndrome) of returning to play prior to complete resolution, and thoroughly reviewed the signs and symptoms of concussion.Reviewed need for complete resolution of all symptoms, with rest AND  exertion, prior to return to play. ?Reviewed red flags for urgent medical evaluation: worsening symptoms, nausea/vomiting, intractable headache, musculoskeletal changes, focal neurological deficits. ?[facility] Concussion Care Plan, which clearly outlines the plans stated above, was given to patient. ? ? ?Level of service: Total encounter time 30 minutes including face-to-face time with the patient and, reviewing past medical record, and charting on the date of service.   ? ? ? ? ? ?After Visit Summary printed out and provided to patient as appropriate. ? ?The above documentation has been reviewed and is accurate and complete [name]  ? ?

## 2021-11-25 ENCOUNTER — Ambulatory Visit (HOSPITAL_COMMUNITY): Payer: Medicare PPO | Attending: Family Medicine | Admitting: Speech Pathology

## 2021-11-25 ENCOUNTER — Ambulatory Visit (HOSPITAL_COMMUNITY): Payer: Medicare PPO

## 2021-11-25 ENCOUNTER — Encounter (HOSPITAL_COMMUNITY): Payer: Self-pay | Admitting: Speech Pathology

## 2021-11-25 ENCOUNTER — Telehealth: Payer: Medicare PPO

## 2021-11-25 DIAGNOSIS — R42 Dizziness and giddiness: Secondary | ICD-10-CM | POA: Diagnosis not present

## 2021-11-25 DIAGNOSIS — R41841 Cognitive communication deficit: Secondary | ICD-10-CM | POA: Insufficient documentation

## 2021-11-25 DIAGNOSIS — R413 Other amnesia: Secondary | ICD-10-CM | POA: Diagnosis not present

## 2021-11-25 DIAGNOSIS — F630 Pathological gambling: Secondary | ICD-10-CM | POA: Insufficient documentation

## 2021-11-25 DIAGNOSIS — R2689 Other abnormalities of gait and mobility: Secondary | ICD-10-CM | POA: Insufficient documentation

## 2021-11-25 NOTE — Therapy (Signed)
[facility] ?[address] ?[place], [place], [address] ?Phone: [phone]   Fax:  [phone] ? ?Speech Language Pathology Treatment ? ?Patient Details  ?Name: Nicole Horn ?MRN: [id number] ?Date of Birth: Oct 27, 1945 ?Referring Provider (SLP): Nicole Leader, MD ? ? ?Encounter Date: 11/25/2021 ? ? End of Session - 11/25/21 1013   ? ? Visit Number 8   ? Number of Visits 9   ? Date for SLP Re-Evaluation 11/27/21   ? Authorization Type [employer] Medicare   [employer] mcr ppo eff 08/23/2021-current  $40.00 co-pay  no ded  oop max $8,300.00/ $90.00 met  no visit limit  no co-ins  auth req via cohere  ? Authorization Time Period [facility]Here approved ST 8 visits 10/19/2021-11/27/2021  QZRA#[id number]   ? SLP Start Time [phone]   ? SLP Stop Time  1035   ? SLP Time Calculation (min) 45 min   ? Activity Tolerance Patient tolerated treatment well   ? ?  ?  ? ?  ? ? ?Past Medical History:  ?Diagnosis Date  ? 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 Chronic thumb pain, right   ? Essential hypertension   ? Heart attack [facility]) 2004  ? [place] - hospitalized with stress test by report and presumably medical therapy; subsequent cardiac catheterization (no PCI)  ? History of COVID-19 08/2019  ? History of irregular heartbeat   ? History of syphilis   ? Mixed hyperlipidemia   ? PAD (peripheral artery disease) ([place]) 2019  ? PTCA/stent [place]  ? Pneumonia due to COVID-19 virus 09/10/2019  ? Sleep apnea   ? Pt supposed to wear CPAP, but doesn't.  ? Type 2 diabetes mellitus ([place])   ? ? ?Past Surgical History:  ?Procedure Laterality Date  ? ABDOMINAL HYSTERECTOMY    ? BACK SURGERY    ? cervical fuision  ? BARTHOLIN GLAND CYST EXCISION Left 03/04/2015  ? Procedure: EXCISION OF LEFT BARTHOLINS TUMOR;  Surgeon: [name], MD;  Location: [facility];  Service: Gynecology;  Laterality: Left;  ? CATARACT EXTRACTION W/PHACO Right 03/27/2021  ? Procedure: CATARACT EXTRACTION PHACO AND INTRAOCULAR LENS  PLACEMENT (IOC);  Surgeon: Baruch Goldmann, MD;  Location: [facility];  Service: Ophthalmology;  Laterality: Right;  cde 8.45  ? CATARACT EXTRACTION W/PHACO Left 11/06/2021  ? Procedure: CATARACT EXTRACTION PHACO AND INTRAOCULAR LENS PLACEMENT (IOC);  Surgeon: Baruch Goldmann, MD;  Location: [facility];  Service: Ophthalmology;  Laterality: Left;  CDE:13.69  ? CERVICAL SPINE SURGERY    ? COLONOSCOPY N/A 02/01/2020  ? Procedure: COLONOSCOPY;  Surgeon: Daneil Dolin, MD; Scattered medium mouth diverticula in the sigmoid and descending colon, otherwise normal exam.  ? CYST REMOVAL TRUNK    ? ESOPHAGOGASTRODUODENOSCOPY (EGD) WITH PROPOFOL N/A 03/27/2020  ? Procedure: ESOPHAGOGASTRODUODENOSCOPY (EGD) WITH PROPOFOL;  Surgeon: Daneil Dolin, MD;  [facility];  Service: Endoscopy;  Laterality: N/A;  1:00pm  ? GIVENS CAPSULE STUDY N/A 03/27/2020  ? Procedure: GIVENS CAPSULE STUDY;  Surgeon: Daneil Dolin, MD;  [facility];  Service: Endoscopy;  Laterality: N/A;  ? Multiple cyst removal surgeries    ? Stent of lower extremities    ? ? ?There were no vitals filed for this visit. ? ? Subjective Assessment - 11/25/21 1004   ? ? Subjective "I am dealing with some things with my Mom."   ? Currently in Pain? No/denies   ? ?  ?  ? ?  ? ? ? ?  ADULT SLP TREATMENT - 11/25/21 1008   ? ?  ? General Information  ? Behavior/Cognition Alert;Cooperative;Pleasant mood   ? Patient Positioning Upright in chair   ? Oral care provided N/A   ? HPI Nicole Horn,  is a 76 y.o. female who presents for evaluation of a head injury that occurred on 08/18/21 when she hit the L side of her head on a display at [place] while bending over to pick something up.  She was seen at the [facility] ED on 08/21/21 w/ c/o con't HA and was later seen by her PCP on 09/22/21 w/ the same c/o. Pt was referred for cognitive rehab by Dr. Lynne Horn due to post concussion symptoms of impaired attention, memory, and word finding. Head CT completed  on 08/21/21 was unremarkable. She recently started taking nortriptyline, which has helped her headaches and sleep.   ?  ? Treatment Provided  ? Treatment provided Cognitive-Linquistic   ?  ? Pain Assessment  ? Pain Assessment No/denies pain   ?  ? Cognitive-Linquistic Treatment  ? Treatment focused on Cognition;Patient/family/caregiver education   ? Skilled Treatment SLP provided education and probes for ongoing self evaluation of implementing strategies at home. Pt reports that she did not bring her "organizational systems" on her recent trip to [place], but is now using them again at home. She reported feeling stressed caring for her mother due to mother showing memory and communication difficulties. SLP suggested that she use a white board or piece of paper with a daily outline written so that her mother can see it and they can make adjustments to it as needed.   ?  ? Assessment / Recommendations / Plan  ? Plan All goals met;Discharge SLP treatment due to (comment)   ?  ? Progression Toward Goals  ? Progression toward goals Goals met, education completed, patient discharged from SLP   ? ?  ?  ? ?  ? ? ? ? ? SLP Short Term Goals - 11/25/21 1014   ? ?  ? SLP [place] #1  ? Title Pt will complete selective and alternating attention tasks (moderately complex) with 90% acc and min cues.   ? Baseline ~80%   ? Time 4   ? Status Achieved   ? Target Date 11/26/21   ?  ? SLP SHORT TERM GOAL #2  ? Title Pt will increase recall for requested information to 95% acc during functional memory exercises with use of compensatory strategies as needed when provided min cues.   ? Baseline 75%   ? Time 4   ? Period Weeks   ? Status Achieved   ? Target Date 11/26/21   ?  ? SLP SHORT TERM GOAL #3  ? Title Pt will utilize external memory strategies in home environment by recording 3 items daily in planner, notebook, daily memory writing task daily for 5/7 days   ? Baseline Pt has system but is still paying bills late and missing  appointments   ? Time 4   ? Period Weeks   ? Status Achieved   ? Target Date 11/26/21   ?  ? SLP SHORT TERM GOAL #4  ? Title Pt will plan and execute hypothetical problem solving tasks that involve multitasking with 95% acc with min assist from SLP for strategies.   ? Baseline mod assist   ? Time 4   ? Period Weeks   ? Status Achieved   ? Target Date 11/26/21   ?  ?  SLP SHORT TERM GOAL #5  ? Title Pt will verbally express complex thoughts with use of word finding strategies as needed when describing previous events/activities, "how to" scenarios, and future plans with 90% effectiveness as judged by SLP and Pt through self-evaluation and audio recording when provided min cues.   ? Baseline circumlocution, dysnomia, losing train of thought   ? Time 4   ? Status Achieved   ? Target Date 11/26/21   ? ?  ?  ? ?  ? ? ? ? ? Plan - 11/25/21 1014   ? ? Clinical Impression Statement Pt reports feeling positive about her recovery and her plan to incorporate her strategies going forward. She has all the templates to use at home (to do). She met all short term goals and will be discharged at this time. Pt is in agreement with plan for discharge.   ? Treatment/Interventions Compensatory strategies;Patient/family education;Cueing hierarchy;SLP instruction and feedback;Compensatory techniques;Cognitive reorganization;Functional tasks   ? Potential to Achieve Goals Good   ? Potential Considerations Ability to learn/carryover information   ? SLP Home Exercise Plan Pt will completed HEP as assigned to facilitate carryover of treatment strategies and techniques in home environment with use of written cues as needed.   ? Consulted and Agree with Plan of Care Patient   ? ?  ?  ? ?  ? ? ?Patient will benefit from skilled therapeutic intervention in order to improve the following deficits and impairments:   ?Cognitive communication deficit ? ?Memory change ? ? ? ?Problem List ?Patient Active Problem List  ? Diagnosis Date Noted  ?  Compulsive gambling 11/25/2021  ? Concussion with no loss of consciousness 09/22/2021  ? Persistent headaches 09/22/2021  ? Frequent headaches 09/22/2021  ? Prediabetes 09/22/2021  ? LFTs abnormal 02/06/2021  ? Herp

## 2021-12-02 ENCOUNTER — Inpatient Hospital Stay (HOSPITAL_COMMUNITY): Payer: Medicare PPO | Attending: Hematology

## 2021-12-02 DIAGNOSIS — Z87891 Personal history of nicotine dependence: Secondary | ICD-10-CM | POA: Diagnosis not present

## 2021-12-02 DIAGNOSIS — Z8619 Personal history of other infectious and parasitic diseases: Secondary | ICD-10-CM | POA: Diagnosis not present

## 2021-12-02 DIAGNOSIS — Z8249 Family history of ischemic heart disease and other diseases of the circulatory system: Secondary | ICD-10-CM | POA: Insufficient documentation

## 2021-12-02 DIAGNOSIS — I10 Essential (primary) hypertension: Secondary | ICD-10-CM | POA: Insufficient documentation

## 2021-12-02 DIAGNOSIS — I252 Old myocardial infarction: Secondary | ICD-10-CM | POA: Diagnosis not present

## 2021-12-02 DIAGNOSIS — E782 Mixed hyperlipidemia: Secondary | ICD-10-CM | POA: Diagnosis not present

## 2021-12-02 DIAGNOSIS — R5383 Other fatigue: Secondary | ICD-10-CM | POA: Diagnosis not present

## 2021-12-02 DIAGNOSIS — F5089 Other specified eating disorder: Secondary | ICD-10-CM | POA: Diagnosis not present

## 2021-12-02 DIAGNOSIS — Z803 Family history of malignant neoplasm of breast: Secondary | ICD-10-CM | POA: Diagnosis not present

## 2021-12-02 DIAGNOSIS — Z79899 Other long term (current) drug therapy: Secondary | ICD-10-CM | POA: Insufficient documentation

## 2021-12-02 DIAGNOSIS — E669 Obesity, unspecified: Secondary | ICD-10-CM | POA: Insufficient documentation

## 2021-12-02 DIAGNOSIS — D509 Iron deficiency anemia, unspecified: Secondary | ICD-10-CM | POA: Diagnosis not present

## 2021-12-02 DIAGNOSIS — Z8616 Personal history of COVID-19: Secondary | ICD-10-CM | POA: Diagnosis not present

## 2021-12-02 DIAGNOSIS — G2581 Restless legs syndrome: Secondary | ICD-10-CM | POA: Insufficient documentation

## 2021-12-02 DIAGNOSIS — Z823 Family history of stroke: Secondary | ICD-10-CM | POA: Insufficient documentation

## 2021-12-02 DIAGNOSIS — R42 Dizziness and giddiness: Secondary | ICD-10-CM | POA: Diagnosis not present

## 2021-12-02 DIAGNOSIS — Z800 Family history of malignant neoplasm of digestive organs: Secondary | ICD-10-CM | POA: Diagnosis not present

## 2021-12-02 LAB — CBC WITH DIFFERENTIAL/PLATELET
Abs Immature Granulocytes: 0.01 10*3/uL (ref 0.00–0.07)
Basophils Absolute: 0 10*3/uL (ref 0.0–0.1)
Basophils Relative: 1 %
Eosinophils Absolute: 0.1 10*3/uL (ref 0.0–0.5)
Eosinophils Relative: 3 %
HCT: 33.4 % — ABNORMAL LOW (ref 36.0–46.0)
Hemoglobin: 10 g/dL — ABNORMAL LOW (ref 12.0–15.0)
Immature Granulocytes: 0 %
Lymphocytes Relative: 41 %
Lymphs Abs: 1.8 10*3/uL (ref 0.7–4.0)
MCH: 24.7 pg — ABNORMAL LOW (ref 26.0–34.0)
MCHC: 29.9 g/dL — ABNORMAL LOW (ref 30.0–36.0)
MCV: 82.5 fL (ref 80.0–100.0)
Monocytes Absolute: 0.5 10*3/uL (ref 0.1–1.0)
Monocytes Relative: 11 %
Neutro Abs: 1.9 10*3/uL (ref 1.7–7.7)
Neutrophils Relative %: 44 %
Platelets: 353 10*3/uL (ref 150–400)
RBC: 4.05 MIL/uL (ref 3.87–5.11)
RDW: 16 % — ABNORMAL HIGH (ref 11.5–15.5)
WBC: 4.3 10*3/uL (ref 4.0–10.5)
nRBC: 0 % (ref 0.0–0.2)

## 2021-12-02 LAB — IRON AND TIBC
Iron: 29 ug/dL (ref 28–170)
Saturation Ratios: 6 % — ABNORMAL LOW (ref 10.4–31.8)
TIBC: 486 ug/dL — ABNORMAL HIGH (ref 250–450)
UIBC: 457 ug/dL

## 2021-12-02 LAB — FERRITIN: Ferritin: 10 ng/mL — ABNORMAL LOW (ref 11–307)

## 2021-12-03 ENCOUNTER — Ambulatory Visit (HOSPITAL_COMMUNITY): Payer: Medicare PPO

## 2021-12-03 DIAGNOSIS — R42 Dizziness and giddiness: Secondary | ICD-10-CM

## 2021-12-03 DIAGNOSIS — R413 Other amnesia: Secondary | ICD-10-CM

## 2021-12-03 DIAGNOSIS — R2689 Other abnormalities of gait and mobility: Secondary | ICD-10-CM | POA: Diagnosis not present

## 2021-12-03 DIAGNOSIS — R41841 Cognitive communication deficit: Secondary | ICD-10-CM | POA: Diagnosis not present

## 2021-12-03 NOTE — Therapy (Addendum)
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   Type 2 diabetes mellitus (HCC)    Past Surgical History:  Procedure Laterality Date   ABDOMINAL HYSTERECTOMY     BACK SURGERY     cervical fuision   BARTHOLIN GLAND CYST EXCISION Left 03/04/2015   Procedure: EXCISION OF LEFT BARTHOLINS TUMOR;  Surgeon: Tilda Burrow, MD;  Location: [facility];  Service: Gynecology;  Laterality: Left;   CATARACT EXTRACTION W/PHACO Right 03/27/2021   Procedure: CATARACT EXTRACTION PHACO AND INTRAOCULAR LENS PLACEMENT (IOC);  Surgeon: Fabio Pierce, MD;  Location: [facility];  Service: Ophthalmology;  Laterality: Right;  cde 8.45   CATARACT EXTRACTION W/PHACO Left 11/06/2021   Procedure: CATARACT EXTRACTION PHACO AND INTRAOCULAR LENS PLACEMENT (IOC);  Surgeon: Fabio Pierce, MD;  Location: [facility];   Service: Ophthalmology;  Laterality: Left;  CDE:13.69   CERVICAL SPINE SURGERY     COLONOSCOPY N/A 02/01/2020   Procedure: COLONOSCOPY;  Surgeon: Corbin Ade, MD; Scattered medium mouth diverticula in the sigmoid and descending colon, otherwise normal exam.   CYST REMOVAL TRUNK     ESOPHAGOGASTRODUODENOSCOPY (EGD) WITH PROPOFOL N/A 03/27/2020   Procedure: ESOPHAGOGASTRODUODENOSCOPY (EGD) WITH PROPOFOL;  Surgeon: Corbin Ade, MD;  [facility];  Service: Endoscopy;  Laterality: N/A;  1:00pm   GIVENS CAPSULE STUDY N/A 03/27/2020   Procedure: GIVENS CAPSULE STUDY;  Surgeon: Corbin Ade, MD;  [facility];  Service: Endoscopy;  Laterality: N/A;   Multiple cyst removal surgeries     Stent of lower extremities     Patient Active Problem List   Diagnosis Date Noted   Compulsive gambling 11/25/2021   Concussion with no loss of consciousness 09/22/2021   Persistent headaches 09/22/2021   Frequent headaches 09/22/2021   Prediabetes 09/22/2021   LFTs abnormal 02/06/2021   Herpes 01/02/2021   Palpitations 11/27/2020   Depression, major, single episode, severe (HCC) 10/22/2020   Insomnia 10/22/2020   Caregiver role strain 07/23/2020   Primary osteoarthritis of first carpometacarpal joint of right hand 07/16/2020   Annual visit for general adult medical examination with abnormal findings 04/16/2020   Postmenopausal 04/16/2020  Screening mammogram, encounter for 04/16/2020   Atypical mole 04/16/2020   Need for immunization against influenza 04/16/2020   Constipation 03/17/2020   PVD (peripheral vascular disease) (HCC) 03/14/2020   CAD (coronary artery disease) 03/14/2020   Iron deficiency anemia 03/14/2020   DOE (dyspnea on exertion) 03/10/2020   H/O adenomatous polyp of colon 01/02/2020   FH: colon cancer 01/02/2020   Heart disease 12/11/2019   Upper airway cough syndrome 11/13/2019   Overweight with body mass index (BMI) of 29 to 29.9 in adult 10/16/2019    Essential hypertension 09/10/2019   Hyperlipemia 09/10/2019    REFERRING DIAG: dizziness/vertigo  THERAPY DIAG:  Dizziness and giddiness  Memory change  Other abnormalities of gait and mobility  Cognitive communication deficit  PERTINENT HISTORY: HTN, DM  PRECAUTIONS: fall  SUBJECTIVE: Patient reports she has had a couple episodes of lightheadedness over the past 2 days; she arrived 23 min late for appointment today and Is agreeable to shortened session.   PAIN:  Are you having pain? No pain today       TODAY'S TREATMENT:  12/03/21 UBE backwards x 2 min Heel raises 2 x 10 Marching 2 x 10 SLS 2 x 15 sec Foam stand x 30" Foam stand with head turns 30" Foam stand with head nods 30" 12" step ups x 5 each no UE assist 12" toe taps x 5 each no UE assist 1 lap around gym with head turns, nods and speed changes; no LOB noted    11/25/21 Nustep level 2;  seat 6; arms 8; 4 minutes Heel raises 2 x 10 8" step ups x 10 no UE assist 8" step up and overs without UE assist Standing on blue foam without UE assist x 30" Standing SLS 2 x 15" Tandem stance 2 x 30 " each 2 1 lap around the gym with head turns, speed changes          11/11/21   Exercises Heelraises 20 reps for warm up Standing Single Leg Stance without UE max 24" Lt, 30" Rt Standing Tandem Balance without support 30" each LE lead Combined tandem stance with horizontal head turns 2 sets of 10   Ambulation x 2 laps around gym with gait speed changes, head turns horizontally and vertically *no LOB noted today Fwd and backward tandem gait 3RT each on blue line Ambulation figure 8 walking around cones x 4 Ambulation stepping over hurdles fwd and lateral 12"& 6" 5RT in // bars no UE   Red TB: paloff 10X with NBOS, single leg stance with shoulder extension 10X each   11/09/21  Exercises Heelraises 20 reps for warm up Standing Single Leg Stance without UE max 24" Lt, 30" Rt Standing Tandem Balance  without support 30" each LE lead Combined tandem stance with horizontal head turns 2 sets of 10   Ambulation x 2 laps around gym with gait speed changes, head turns horizontally and vertically Fwd and backward tandem gait 3RT each on blue line Ambulation figure 8 walking around cones x 4 Ambulation stepping over hurdles fwd and lateral 12"& 6" 5RT in // bars no UE   Red TB: paloff 10X with NBOS, single leg stance with shoulder extension 10X each  DGI walkthrough clinic 1RT  11/06/21 Seated Horizontal Smooth Pursuit  x 10 reps Seated Vertical Smooth Pursuit  10 reps seated Gaze Stabilization with Two Near Targets and Head Rotation - 10 reps Seated Gaze Stabilization with Head Nod and Vertical Arm Movement -10 reps  Exercises  Standing Single Leg Stance with Counter Support -2 x 10 sec Standing Tandem Balance with Counter Support - 2 x 10 sec, 1 x 10 sec with eyes closed  Combined SLS and horizontal head turns 2 sets of 10 Combined tandem stance with horizontal head turns 2 sets of 10  Ambulation x 2 laps around gym with gait speed changes, head turns horizontally and vertically  Ambulation figure 8 walking around cones x 4  Ambulation stepping over cones x 4  * gait belt and CGA needed for safety with all balance exercises and ambulation  PATIENT EDUCATION: Education details: HEP progression; awareness of positional changes and how these affect balance Person educated: Patient Education method: Engineer, structural, demonstration Education comprehension: verbalized understanding and returned demonstration   HOME EXERCISE PROGRAM: Access Code: [facility] URL: [email or web address] Date: 11/03/2021 Prepared by: [facility]  Exercises Seated Horizontal Smooth Pursuit - 1 x daily - 7 x weekly - 3 sets - 10 reps Seated Vertical Smooth Pursuit - 1 x daily - 7 x weekly - 3 sets - 10 reps Narrow Stance Gaze Stabilization with Two Near Targets and Head Rotation - 1 x  daily - 7 x weekly - 3 sets - 10 reps Seated Gaze Stabilization with Head Nod and Vertical Arm Movement - 1 x daily - 7 x weekly - 3 sets - 10 reps  Access Code: [email or web address] URL: [email or web address] Date: 11/03/2021 Prepared by: [facility]  Exercises Standing Single Leg Stance with Counter Support - 1 x daily - 7 x weekly - 2 sets - 3 reps - 10-30 sec hold Standing Tandem Balance with Counter Support - 1 x daily - 7 x weekly - 2 sets - 3 reps - 10-30 sec hold     PT Short Term Goals - 10/27/21 0803       PT SHORT TERM GOAL #1   Title Patient will be independent in HEP to improve functional outcomes.    Time 2    Period Weeks    Status met   Target Date 11/10/21              PT Long Term Goals - 10/27/21 0804       PT LONG TERM GOAL #1   Title Patient will report at least 50% improvement in overall symptoms and/or function to demonstrate improved functional mobility    Time 4    Period Weeks    Status ongoing   Target Date 12/10/21      PT LONG TERM GOAL #2   Title Patient will meet predicted FOTO score to demonstrate improved overall function.    Time 4    Period Weeks    Status ongoing   Target Date 12/10/21      PT LONG TERM GOAL #3   Title Patient will improve DGI score from 9 to 12 demonstrating improved functional mobility and decreased fall risk to improve patient's ability to walk in the community.    Baseline DGI 9    Time 4    Period Weeks    Status  ongoing   Target Date 12/10/21               Clinical Impression Statement Today's visit was shortened today due to patient's late arrival. Focus on balance with functional movements; walking with head turns and nods, step ups; no lob noted. 1 visit remain on current orders and patient on track for discharge after that. Patient will continue to benefit from skilled therapy  services to reduce deficits and improve functional level. Requested extended certification from MD to cover 2 visits  since patient missed some time due to out of town trip a couple weeks ago.     Personal Factors and Comorbidities Age;Comorbidity 1     Comorbidities HTN, DM, CAD     Examination-Activity Limitations Bathing;Bed Mobility;Locomotion Level;Stand;Caring for Others;Carry;Transfers;Dressing;Sleep;Sit     Examination-Participation Restrictions Church;Laundry;Cleaning;Shop;Community Activity;Meal Prep;Driving     Stability/Clinical Decision Making Evolving/Moderate complexity     Clinical Decision Making Moderate     Rehab Potential Good     PT Frequency 2x / week     PT Duration 4 weeks     PT Treatment/Interventions ADLs/Self Care Home Management;Aquatic Therapy;Biofeedback;Canalith Repostioning;Traction;Moist Heat;Electrical Stimulation;Iontophoresis 4mg /ml Dexamethasone;Functional mobility training;Stair training;Cryotherapy;Gait training;DME Instruction;Therapeutic activities;Therapeutic exercise;Balance training;Neuromuscular re-education;Orthotic Fit/Training;Patient/family education;Cognitive remediation;Wheelchair mobility training;Manual techniques;Taping;Splinting;Energy conservation;Dry needling;Passive range of motion;Scar mobilization;Vestibular;Visual/perceptual remediation/compensation;Spinal Manipulations;Joint Manipulations     PT Next Visit Plan Review HEP; discharge      1:53 PM, 12/03/21 Surena Welge Small Larinda Herter MPT [facility] [place] [phone] Ph:[phone]

## 2021-12-03 NOTE — Addendum Note (Signed)
Addended byKarn Cassis S on: 12/03/2021 03:46 PM ? ? Modules accepted: Orders ? ?

## 2021-12-08 ENCOUNTER — Ambulatory Visit (HOSPITAL_COMMUNITY): Payer: Medicare PPO

## 2021-12-08 DIAGNOSIS — R2689 Other abnormalities of gait and mobility: Secondary | ICD-10-CM

## 2021-12-08 DIAGNOSIS — R42 Dizziness and giddiness: Secondary | ICD-10-CM

## 2021-12-08 DIAGNOSIS — R41841 Cognitive communication deficit: Secondary | ICD-10-CM | POA: Diagnosis not present

## 2021-12-08 DIAGNOSIS — R413 Other amnesia: Secondary | ICD-10-CM | POA: Diagnosis not present
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
## 2021-12-08 NOTE — Therapy (Signed)
OUTPATIENT PHYSICAL THERAPY TREATMENT NOTE   Patient Name: Nicole Horn MRN: [id number] DOB:March 19, 1946, 76 y.o., female Today's Date: 12/08/2021  PCP: Donell Beers, FNP REFERRING PROVIDER: Donell Beers, FNP  PHYSICAL THERAPY DISCHARGE SUMMARY  Visits from Start of Care: 8  Start of care 10/28/19  Current functional level related to goals / functional outcomes: Overall patient satisfied with functional status   Remaining deficits: Ocassional bouts of lightheadness; not every day   Education / Equipment: HEP review; instruction on how to return to therapy if needed   Patient agrees to discharge. Patient goals were partially met. Patient is being discharged due to being pleased with the current functional level.    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   Type 2 diabetes mellitus (HCC)    Past Surgical History:  Procedure Laterality Date   ABDOMINAL HYSTERECTOMY     BACK SURGERY     cervical fuision   BARTHOLIN GLAND CYST EXCISION Left 03/04/2015   Procedure:  EXCISION OF LEFT BARTHOLINS TUMOR;  Surgeon: Tilda Burrow, MD;  Location: [facility];  Service: Gynecology;  Laterality: Left;   CATARACT EXTRACTION W/PHACO Right 03/27/2021   Procedure: CATARACT EXTRACTION PHACO AND INTRAOCULAR LENS PLACEMENT (IOC);  Surgeon: Fabio Pierce, MD;  Location: [facility];  Service: Ophthalmology;  Laterality: Right;  cde 8.45   CATARACT EXTRACTION W/PHACO Left 11/06/2021   Procedure: CATARACT EXTRACTION PHACO AND INTRAOCULAR LENS PLACEMENT (IOC);  Surgeon: Fabio Pierce, MD;  Location: [facility];  Service: Ophthalmology;  Laterality: Left;  CDE:13.69   CERVICAL SPINE SURGERY     COLONOSCOPY N/A 02/01/2020   Procedure: COLONOSCOPY;  Surgeon: Corbin Ade, MD; Scattered medium mouth diverticula in the sigmoid and descending colon, otherwise normal exam.   CYST REMOVAL TRUNK     ESOPHAGOGASTRODUODENOSCOPY (EGD) WITH PROPOFOL N/A 03/27/2020   Procedure: ESOPHAGOGASTRODUODENOSCOPY (EGD) WITH PROPOFOL;  Surgeon: Corbin Ade, MD;  Location: [facility];  Service: Endoscopy;  Laterality: N/A;  1:00pm   GIVENS CAPSULE STUDY N/A 03/27/2020   Procedure: GIVENS CAPSULE STUDY;  Surgeon: Corbin Ade, MD;  Location: [facility];  Service: Endoscopy;  Laterality: N/A;   Multiple cyst removal surgeries     Stent of lower extremities     Patient Active Problem List   Diagnosis Date Noted   Compulsive gambling 11/25/2021  Concussion with no loss of consciousness 09/22/2021   Persistent headaches 09/22/2021   Frequent headaches 09/22/2021   Prediabetes 09/22/2021   LFTs abnormal 02/06/2021   Herpes 01/02/2021   Palpitations 11/27/2020   Depression, major, single episode, severe (HCC) 10/22/2020   Insomnia 10/22/2020   Caregiver role strain 07/23/2020   Primary osteoarthritis of first carpometacarpal joint of right hand 07/16/2020   Annual visit for general adult medical examination with abnormal findings 04/16/2020   Postmenopausal 04/16/2020   Screening mammogram,  encounter for 04/16/2020   Atypical mole 04/16/2020   Need for immunization against influenza 04/16/2020   Constipation 03/17/2020   PVD (peripheral vascular disease) (HCC) 03/14/2020   CAD (coronary artery disease) 03/14/2020   Iron deficiency anemia 03/14/2020   DOE (dyspnea on exertion) 03/10/2020   H/O adenomatous polyp of colon 01/02/2020   FH: colon cancer 01/02/2020   Heart disease 12/11/2019   Upper airway cough syndrome 11/13/2019   Overweight with body mass index (BMI) of 29 to 29.9 in adult 10/16/2019   Essential hypertension 09/10/2019   Hyperlipemia 09/10/2019    REFERRING DIAG: dizziness/vertigo  THERAPY DIAG:  Dizziness and giddiness  Other abnormalities of gait and mobility  PERTINENT HISTORY: HTN, DM  PRECAUTIONS: fall  SUBJECTIVE: Patient reports she is the primary caregiver for her mother and thinks stress is playing a part in her symptoms.  she arrived late for appointment today and Is agreeable to shortened session. Discharge visit today; "70 % better"  PAIN:  Are you having pain? No pain today       TODAY'S TREATMENT:  12/08/21 Review of HEP and goals Walk around the gym with head turns and speed changes  12/03/21 UBE backwards x 2 min Heel raises 2 x 10 Marching 2 x 10 SLS 2 x 15 sec Foam stand x 30" Foam stand with head turns 30" Foam stand with head nods 30" 12" step ups x 5 each no UE assist 12" toe taps x 5 each no UE assist 1 lap around gym with head turns, nods and speed changes; no LOB noted    11/25/21 Nustep level 2;  seat 6; arms 8; 4 minutes Heel raises 2 x 10 8" step ups x 10 no UE assist 8" step up and overs without UE assist Standing on blue foam without UE assist x 30" Standing SLS 2 x 15" Tandem stance 2 x 30 " each 2 1 lap around the gym with head turns, speed changes      PATIENT EDUCATION: Education details: HEP progression; awareness of positional changes and how these affect balance Person educated:  Patient Education method: Engineer, structural, demonstration Education comprehension: verbalized understanding and returned demonstration   HOME EXERCISE PROGRAM: Exercises Seated Horizontal Smooth Pursuit - 1 x daily - 7 x weekly - 3 sets - 10 reps Seated Vertical Smooth Pursuit - 1 x daily - 7 x weekly - 3 sets - 10 reps Narrow Stance Gaze Stabilization with Two Near Targets and Head Rotation - 1 x daily - 7 x weekly - 3 sets - 10 reps Seated Gaze Stabilization with Head Nod and Vertical Arm Movement - 1 x daily - 7 x weekly - 3 sets - 10 reps   Exercises Standing Single Leg Stance with Counter Support - 1 x daily - 7 x weekly - 2 sets - 3 reps - 10-30 sec hold Standing Tandem Balance with Counter Support - 1 x daily - 7 x weekly - 2 sets - 3 reps -  10-30 sec hold     PT Short Term Goals - 10/27/21 0803       PT SHORT TERM GOAL #1   Title Patient will be independent in HEP to improve functional outcomes.    Time 2    Period Weeks    Status met   Target Date 11/10/21              PT Long Term Goals - 10/27/21 0804       PT LONG TERM GOAL #1   Title Patient will report at least 50% improvement in overall symptoms and/or function to demonstrate improved functional mobility    Time 4    Period Weeks    Status Met; 70% better   Target Date 12/10/21      PT LONG TERM GOAL #2   Title Patient will meet predicted FOTO score to demonstrate improved overall function.    Time 4    Period Weeks    Status Not met   Target Date 12/10/21      PT LONG TERM GOAL #3   Title Patient will improve DGI score from 9 to 12 demonstrating improved functional mobility and decreased fall risk to improve patient's ability to walk in the community.    Baseline DGI 9    Time 4    Period Weeks    Status  Partially met   Target Date 12/10/21               Clinical Impression Statement Today's visit was shortened today due to patient's late arrival. Review of HEP and goals; patient is  satisfied with her current functional status.  Attributes some of her remaining symptoms to stress as she is the primary caregiver for her mother. D/C to independent HEP.     Personal Factors and Comorbidities Age;Comorbidity 1     Comorbidities HTN, DM, CAD     Examination-Activity Limitations Bathing;Bed Mobility;Locomotion Level;Stand;Caring for Others;Carry;Transfers;Dressing;Sleep;Sit     Examination-Participation Restrictions Church;Laundry;Cleaning;Shop;Community Activity;Meal Prep;Driving     Stability/Clinical Decision Making Evolving/Moderate complexity     Clinical Decision Making Moderate     Rehab Potential Good     PT Frequency 2x / week     PT Duration 4 weeks     PT Treatment/Interventions ADLs/Self Care Home Management;Aquatic Therapy;Biofeedback;Canalith Repostioning;Traction;Moist Heat;Electrical Stimulation;Iontophoresis 4mg /ml Dexamethasone;Functional mobility training;Stair training;Cryotherapy;Gait training;DME Instruction;Therapeutic activities;Therapeutic exercise;Balance training;Neuromuscular re-education;Orthotic Fit/Training;Patient/family education;Cognitive remediation;Wheelchair mobility training;Manual techniques;Taping;Splinting;Energy conservation;Dry needling;Passive range of motion;Scar mobilization;Vestibular;Visual/perceptual remediation/compensation;Spinal Manipulations;Joint Manipulations     PT Next Visit Plan discharge      11:11 AM, 12/08/21 Doni Widmer Small Jaivyn Gulla MPT [facility] [place] [phone] [phone]

## 2021-12-09 ENCOUNTER — Inpatient Hospital Stay (HOSPITAL_BASED_OUTPATIENT_CLINIC_OR_DEPARTMENT_OTHER): Payer: Medicare PPO | Admitting: Physician Assistant

## 2021-12-09 ENCOUNTER — Ambulatory Visit (HOSPITAL_BASED_OUTPATIENT_CLINIC_OR_DEPARTMENT_OTHER): Payer: Medicare PPO | Admitting: Cardiovascular Disease

## 2021-12-09 VITALS — HR 96 | Temp 98.6°F | Resp 16 | Wt 156.0 lb

## 2021-12-09 DIAGNOSIS — E669 Obesity, unspecified: Secondary | ICD-10-CM | POA: Diagnosis not present

## 2021-12-09 DIAGNOSIS — D509 Iron deficiency anemia, unspecified: Secondary | ICD-10-CM

## 2021-12-09 DIAGNOSIS — R42 Dizziness and giddiness: Secondary | ICD-10-CM | POA: Diagnosis not present

## 2021-12-09 DIAGNOSIS — I10 Essential (primary) hypertension: Secondary | ICD-10-CM | POA: Diagnosis not present

## 2021-12-09 DIAGNOSIS — R5383 Other fatigue: Secondary | ICD-10-CM | POA: Diagnosis not present

## 2021-12-09 DIAGNOSIS — G2581 Restless legs syndrome: Secondary | ICD-10-CM | POA: Diagnosis not present

## 2021-12-09 DIAGNOSIS — I252 Old myocardial infarction: Secondary | ICD-10-CM | POA: Diagnosis not present

## 2021-12-09 DIAGNOSIS — F5089 Other specified eating disorder: Secondary | ICD-10-CM | POA: Diagnosis not present

## 2021-12-09 DIAGNOSIS — E782 Mixed hyperlipidemia: Secondary | ICD-10-CM | POA: Diagnosis not present

## 2021-12-09 NOTE — Patient Instructions (Signed)
[place] [facility] ?Discharge Instructions ? ?You were seen today by Tarri Abernethy PA-C for your iron deficiency anemia.  Your iron and blood levels are low, so we will schedule you for IV iron x3 doses.   ? ?FOLLOW-UP APPOINTMENT: Repeat labs and follow-up visit in 4 months. ? ? ?Thank you for choosing [place] at [facility] to provide your oncology and hematology care.  To afford each patient quality time with our provider, please arrive at least 15 minutes before your scheduled appointment time.  ? ?If you have a lab appointment with the [facility] please come in thru the Main Entrance and check in at the main information desk. ? ?You need to re-schedule your appointment should you arrive 10 or more minutes late.  We strive to give you quality time with our providers, and arriving late affects you and other patients whose appointments are after yours.  Also, if you no show three or more times for appointments you may be dismissed from the clinic at the providers discretion.     ?Again, thank you for choosing [facility]  Our hope is that these requests will decrease the amount of time that you wait before being seen by our physicians.       ?_____________________________________________________________ ? ?Should you have questions after your visit to [facility], please contact our office at [phone] and follow the prompts.  Our office hours are 8:00 a.m. and 4:30 p.m. Monday - Friday.  Please note that voicemails left after 4:00 p.m. may not be returned until the following business day.  We are closed weekends and major holidays.  You do have access to a nurse 24-7, just call the main number to the clinic [phone] and do not press any options, hold on the line and a nurse will answer the phone.   ? ?For prescription refill requests, have your pharmacy contact our office and allow 72 hours.   ? ?Due to Covid, you  will need to wear a mask upon entering the hospital. If you do not have a mask, a mask will be given to you at the Main Entrance upon arrival. For doctor visits, patients may have 1 support person age 1 or older with them. For treatment visits, patients can not have anyone with them due to social distancing guidelines and our immunocompromised population.  ? ? ? ?

## 2021-12-11 ENCOUNTER — Telehealth: Payer: Medicare PPO

## 2021-12-14 DIAGNOSIS — Z1330 Encounter for screening examination for mental health and behavioral disorders, unspecified: Secondary | ICD-10-CM | POA: Diagnosis not present

## 2021-12-14 DIAGNOSIS — I2510 Atherosclerotic heart disease of native coronary artery without angina pectoris: Secondary | ICD-10-CM | POA: Diagnosis not present

## 2021-12-14 DIAGNOSIS — Z0130 Encounter for examination of blood pressure without abnormal findings: Secondary | ICD-10-CM | POA: Diagnosis not present

## 2021-12-14 DIAGNOSIS — N1831 Chronic kidney disease, stage 3a: Secondary | ICD-10-CM | POA: Diagnosis not present

## 2021-12-14 DIAGNOSIS — I129 Hypertensive chronic kidney disease with stage 1 through stage 4 chronic kidney disease, or unspecified chronic kidney disease: Secondary | ICD-10-CM | POA: Diagnosis not present

## 2021-12-14 DIAGNOSIS — Z113 Encounter for screening for infections with a predominantly sexual mode of transmission: Secondary | ICD-10-CM | POA: Diagnosis not present

## 2021-12-14 DIAGNOSIS — Z1211 Encounter for screening for malignant neoplasm of colon: Secondary | ICD-10-CM | POA: Diagnosis not present

## 2021-12-14 DIAGNOSIS — Z0000 Encounter for general adult medical examination without abnormal findings: Secondary | ICD-10-CM | POA: Diagnosis not present

## 2021-12-14 DIAGNOSIS — I739 Peripheral vascular disease, unspecified: Secondary | ICD-10-CM | POA: Diagnosis not present

## 2021-12-14 DIAGNOSIS — Z1331 Encounter for screening for depression: Secondary | ICD-10-CM | POA: Diagnosis not present

## 2021-12-14 DIAGNOSIS — I10 Essential (primary) hypertension: Secondary | ICD-10-CM | POA: Diagnosis not present

## 2021-12-14 DIAGNOSIS — Z0184 Encounter for antibody response examination: Secondary | ICD-10-CM | POA: Diagnosis not present

## 2021-12-14 DIAGNOSIS — Z1231 Encounter for screening mammogram for malignant neoplasm of breast: Secondary | ICD-10-CM | POA: Diagnosis not present

## 2021-12-14 DIAGNOSIS — E1122 Type 2 diabetes mellitus with diabetic chronic kidney disease: Secondary | ICD-10-CM | POA: Diagnosis not present

## 2021-12-14 DIAGNOSIS — Z6829 Body mass index (BMI) 29.0-29.9, adult: Secondary | ICD-10-CM | POA: Diagnosis not present

## 2021-12-14 DIAGNOSIS — Z139 Encounter for screening, unspecified: Secondary | ICD-10-CM | POA: Diagnosis not present

## 2021-12-15 ENCOUNTER — Encounter (HOSPITAL_COMMUNITY): Payer: Medicare PPO

## 2021-12-16 DIAGNOSIS — Z1211 Encounter for screening for malignant neoplasm of colon: Secondary | ICD-10-CM | POA: Diagnosis not present

## 2021-12-21 ENCOUNTER — Inpatient Hospital Stay (HOSPITAL_COMMUNITY): Payer: Medicare PPO | Attending: Hematology

## 2021-12-21 VITALS — BP 158/64 | HR 79 | Temp 97.8°F | Resp 16 | Wt 159.6 lb

## 2021-12-21 DIAGNOSIS — R609 Edema, unspecified: Secondary | ICD-10-CM | POA: Diagnosis not present

## 2021-12-21 DIAGNOSIS — Z823 Family history of stroke: Secondary | ICD-10-CM | POA: Insufficient documentation

## 2021-12-21 DIAGNOSIS — R5383 Other fatigue: Secondary | ICD-10-CM | POA: Insufficient documentation

## 2021-12-21 DIAGNOSIS — Z8249 Family history of ischemic heart disease and other diseases of the circulatory system: Secondary | ICD-10-CM | POA: Insufficient documentation

## 2021-12-21 DIAGNOSIS — F419 Anxiety disorder, unspecified: Secondary | ICD-10-CM | POA: Insufficient documentation

## 2021-12-21 DIAGNOSIS — D509 Iron deficiency anemia, unspecified: Secondary | ICD-10-CM | POA: Insufficient documentation

## 2021-12-21 DIAGNOSIS — Z79899 Other long term (current) drug therapy: Secondary | ICD-10-CM | POA: Insufficient documentation

## 2021-12-21 DIAGNOSIS — Z803 Family history of malignant neoplasm of breast: Secondary | ICD-10-CM | POA: Insufficient documentation

## 2021-12-21 DIAGNOSIS — Z800 Family history of malignant neoplasm of digestive organs: Secondary | ICD-10-CM | POA: Diagnosis not present

## 2021-12-21 MED ORDER — ACETAMINOPHEN 325 MG PO TABS
650.0000 mg | ORAL_TABLET | Freq: Once | ORAL | Status: AC
  Administered 2021-12-21: 650 mg via ORAL
  Filled 2021-12-21: qty 2

## 2021-12-21 MED ORDER — SODIUM CHLORIDE 0.9 % IV SOLN: Freq: Once | INTRAVENOUS | Status: AC

## 2021-12-21 MED ORDER — SODIUM CHLORIDE 0.9 % IV SOLN
300.0000 mg | Freq: Once | INTRAVENOUS | Status: AC
  Administered 2021-12-21: 300 mg via INTRAVENOUS
  Filled 2021-12-21: qty 10

## 2021-12-21 MED ORDER — LORATADINE 10 MG PO TABS
10.0000 mg | ORAL_TABLET | Freq: Once | ORAL | Status: AC
  Administered 2021-12-21: 10 mg via ORAL
  Filled 2021-12-21: qty 1

## 2021-12-21 NOTE — Progress Notes (Signed)
Patient tolerated iron infusion with no complaints voiced.  Peripheral IV site clean and dry with good blood return noted before and after infusion.  Band aid applied.  VSS with discharge and left in satisfactory condition with no s/s of distress noted.   

## 2021-12-21 NOTE — Patient Instructions (Signed)
[facility]  Discharge Instructions: Thank you for choosing [facility] to provide your oncology and hematology care.  If you have a lab appointment with the Cancer Center, please come in thru the Main Entrance and check in at the main information desk.  Wear comfortable clothing and clothing appropriate for easy access to any Portacath or PICC line.   We strive to give you quality time with your provider. You may need to reschedule your appointment if you arrive late (15 or more minutes).  Arriving late affects you and other patients whose appointments are after yours.  Also, if you miss three or more appointments without notifying the office, you may be dismissed from the clinic at the provider's discretion.      For prescription refill requests, have your pharmacy contact our office and allow 72 hours for refills to be completed.        To help prevent nausea and vomiting after your treatment, we encourage you to take your nausea medication as directed.  BELOW ARE SYMPTOMS THAT SHOULD BE REPORTED IMMEDIATELY: *FEVER GREATER THAN 100.4 F (38 C) OR HIGHER *CHILLS OR SWEATING *NAUSEA AND VOMITING THAT IS NOT CONTROLLED WITH YOUR NAUSEA MEDICATION *UNUSUAL SHORTNESS OF BREATH *UNUSUAL BRUISING OR BLEEDING *URINARY PROBLEMS (pain or burning when urinating, or frequent urination) *BOWEL PROBLEMS (unusual diarrhea, constipation, pain near the anus) TENDERNESS IN MOUTH AND THROAT WITH OR WITHOUT PRESENCE OF ULCERS (sore throat, sores in mouth, or a toothache) UNUSUAL RASH, SWELLING OR PAIN  UNUSUAL VAGINAL DISCHARGE OR ITCHING   Items with * indicate a potential emergency and should be followed up as soon as possible or go to the Emergency Department if any problems should occur.  Please show the CHEMOTHERAPY ALERT CARD or IMMUNOTHERAPY ALERT CARD at check-in to the Emergency Department and triage nurse.  Should you have questions after your visit or need to cancel  or reschedule your appointment, please contact [facility] [phone]  and follow the prompts.  Office hours are 8:00 a.m. to 4:30 p.m. Monday - Friday. Please note that voicemails left after 4:00 p.m. may not be returned until the following business day.  We are closed weekends and major holidays. You have access to a nurse at all times for urgent questions. Please call the main number to the clinic [phone] and follow the prompts.  For any non-urgent questions, you may also contact your provider using MyChart. We now offer e-Visits for anyone 18 and older to request care online for non-urgent symptoms. For details visit [email or web address].   Also download the MyChart app! Go to the app store, search "MyChart", open the app, select [facility], and log in with your MyChart username and password.  Due to Covid, a mask is required upon entering the hospital/clinic. If you do not have a mask, one will be given to you upon arrival. For doctor visits, patients may have 1 support person aged 18 or older with them. For treatment visits, patients cannot have anyone with them due to current Covid guidelines and our immunocompromised population.  

## 2021-12-24 DIAGNOSIS — E611 Iron deficiency: Secondary | ICD-10-CM | POA: Diagnosis not present

## 2021-12-24 DIAGNOSIS — I129 Hypertensive chronic kidney disease with stage 1 through stage 4 chronic kidney disease, or unspecified chronic kidney disease: Secondary | ICD-10-CM | POA: Diagnosis not present

## 2021-12-24 DIAGNOSIS — R760 Raised antibody titer: Secondary | ICD-10-CM | POA: Diagnosis not present

## 2021-12-24 DIAGNOSIS — R809 Proteinuria, unspecified: Secondary | ICD-10-CM | POA: Diagnosis not present

## 2021-12-24 DIAGNOSIS — N189 Chronic kidney disease, unspecified: Secondary | ICD-10-CM | POA: Diagnosis not present

## 2021-12-24 DIAGNOSIS — E1122 Type 2 diabetes mellitus with diabetic chronic kidney disease: Secondary | ICD-10-CM | POA: Diagnosis not present

## 2021-12-24 DIAGNOSIS — K760 Fatty (change of) liver, not elsewhere classified: Secondary | ICD-10-CM | POA: Diagnosis not present

## 2021-12-24 DIAGNOSIS — E1129 Type 2 diabetes mellitus with other diabetic kidney complication: Secondary | ICD-10-CM | POA: Diagnosis not present

## 2021-12-24 DIAGNOSIS — I5032 Chronic diastolic (congestive) heart failure: Secondary | ICD-10-CM | POA: Diagnosis not present

## 2021-12-25 ENCOUNTER — Ambulatory Visit (INDEPENDENT_AMBULATORY_CARE_PROVIDER_SITE_OTHER): Payer: Medicare PPO | Admitting: Licensed Clinical Social Worker

## 2021-12-25 DIAGNOSIS — I10 Essential (primary) hypertension: Secondary | ICD-10-CM

## 2021-12-25 DIAGNOSIS — D509 Iron deficiency anemia, unspecified: Secondary | ICD-10-CM

## 2021-12-25 DIAGNOSIS — R519 Headache, unspecified: Secondary | ICD-10-CM

## 2021-12-25 DIAGNOSIS — F322 Major depressive disorder, single episode, severe without psychotic features: Secondary | ICD-10-CM

## 2021-12-25 DIAGNOSIS — E785 Hyperlipidemia, unspecified: Secondary | ICD-10-CM

## 2021-12-25 DIAGNOSIS — G4700 Insomnia, unspecified: Secondary | ICD-10-CM

## 2021-12-25 NOTE — Patient Instructions (Addendum)
Visit Information ? ?Patient Goals: Depressive Symptoms Identified. Manage Depression issues ? ?Time Frame   Short Term goal ?Priority:  Medium ?Progress:  On Track ? ?Start Date:  12/25/21 ?Completion Date:  03/19/22 ? ?Follow Up Date: 02/04/22 at 3:00 PM ? ?Depressive Symptoms Identified. Manage depression issues ? ?Patient Coping Skills: ?Attends scheduled medical appointments ?Takes medications as prescribed ?Has support from her son, Nicole Horn ? ?Patient Deficits:  ?Depression issues ?Caregiver Strain (helps provide for care needs of her other) ?Financial issues ? ?Patient goals: ? ?Attend scheduled medical appointments in next 30 days ?Take medications as prescribed ?Practice self care (allow time to rest, relax; read, listen to music) ? ?Follow Up Plan:  LCSW to call client on 02/04/2022 at 3:00 PM ? ?Nicole Horn MSW, LCSW ?Licensed Clinical Social Worker ?[facility] ?[phone] ?

## 2021-12-25 NOTE — Chronic Care Management (AMB) (Signed)
?Chronic Care Management  ? ? Clinical Social Work Note ? ?12/25/2021 ?Name: Nicole Horn MRN: [id number] DOB: [birth year] ? ?Nicole Horn is a 76 y.o. year old female who is a primary care patient of Renee Rival, FNP. The CCM team was consulted to assist the patient with chronic disease management and/or care coordination needs related to: [employer] .  ? ?Engaged with patient by telephone for initial visit in response to provider referral for social work chronic care management and care coordination services.  ? ?Consent to Services:  ?The patient was given the following information about Chronic Care Management services today, agreed to services, and gave verbal consent: 1. CCM service includes personalized support from designated clinical staff supervised by the primary care provider, including individualized plan of care and coordination with other care providers 2. 24/7 contact phone numbers for assistance for urgent and routine care needs. 3. Service will only be billed when office clinical staff spend 20 minutes or more in a month to coordinate care. 4. Only one practitioner may furnish and bill the service in a calendar month. 5.The patient may stop CCM services at any time (effective at the end of the month) by phone call to the office staff. 6. The patient will be responsible for cost sharing (co-pay) of up to 20% of the service fee (after annual deductible is met). Patient agreed to services and consent obtained. ? ?Patient agreed to services and consent obtained.  ? ?Assessment: Review of patient past medical history, allergies, medications, and health status, including review of relevant consultants reports was performed today as part of a comprehensive evaluation and provision of chronic care management and care coordination services.    ? ?SDOH (Social Determinants of Health) assessments and interventions performed:  ?SDOH Interventions   ? ?Flowsheet Row Most  Recent Value  ?SDOH Interventions   ?Financial Strain Interventions Other (Comment)  [copays to doctors can be expensive]  ?Stress Interventions Provide Counseling  [client has stress related to difficulty sleeping.]  ?Depression Interventions/Treatment  Counseling, Medication  ? ?  ?  ? ?Advanced Directives Status: See [employer] application for related entries. ? ?CCM Care Plan ? ?No Known Allergies ? ?Outpatient Encounter Medications as of 12/25/2021  ?Medication Sig Note  ? albuterol (VENTOLIN HFA) 108 (90 Base) MCG/ACT inhaler Inhale 1-2 puffs into the lungs every 6 (six) hours as needed for wheezing or shortness of breath.   ? amLODipine (NORVASC) 10 MG tablet Take 1 tablet (10 mg total) by mouth daily. (Patient taking differently: Take 10 mg by mouth in the morning.)   ? aspirin EC 81 MG tablet Take 81 mg by mouth in the morning. Swallow whole.   ? Calcium Carbonate Antacid (CALCIUM CARBONATE PO) Take 1,200 mg of elemental calcium by mouth in the morning.   ? clopidogrel (PLAVIX) 75 MG tablet TAKE 1 TABLET BY MOUTH  DAILY   ? empagliflozin (JARDIANCE) 10 MG TABS tablet Take by mouth.   ? ferrous sulfate 325 (65 FE) MG EC tablet Take 1 tablet by mouth every morning.   ? ferrous sulfate 325 (65 FE) MG EC tablet Take by mouth. 09/22/2021: Reports taking med every other day.   ? FLUoxetine (PROZAC) 10 MG capsule TAKE 1 CAPSULE BY MOUTH EVERY DAY   ? hydrALAZINE (APRESOLINE) 10 MG tablet Take 1 tablet (10 mg total) by mouth 3 (three) times daily.   ? loperamide (IMODIUM) 2 MG capsule Take 1 capsule (2 mg total) by mouth 4 (four) times  daily as needed for diarrhea or loose stools.   ? meloxicam (MOBIC) 7.5 MG tablet Take 7.5 mg by mouth daily as needed for pain.   ? moxifloxacin (VIGAMOX) 0.5 % ophthalmic solution Place 1 drop into the right eye 2 (two) times daily.   ? Multiple Vitamin (MULTIVITAMIN WITH MINERALS) TABS tablet Take 1 tablet by mouth in the morning. Centrum   ? nortriptyline (PAMELOR) 10 MG capsule TAKE 1  CAPSULE BY MOUTH AT BEDTIME   ? olmesartan-hydrochlorothiazide (BENICAR HCT) 40-25 MG tablet Take 1 tablet by mouth daily.   ? ondansetron (ZOFRAN) 4 MG tablet Take 1 tablet (4 mg total) by mouth every 6 (six) hours as needed for nausea or vomiting.   ? prednisoLONE acetate (PRED FORTE) 1 % ophthalmic suspension Place 1 drop into the right eye 4 (four) times daily.   ? Probiotic Product (DIGESTIVE ADVANTAGE) CAPS Take 1 capsule by mouth daily.   ? rosuvastatin (CRESTOR) 40 MG tablet Take 1 tablet (40 mg total) by mouth daily. (Patient taking differently: Take 40 mg by mouth in the morning.)   ? spironolactone (ALDACTONE) 25 MG tablet Take 1 tablet (25 mg total) by mouth daily.   ? valACYclovir (VALTREX) 500 MG tablet Take 500 mg by mouth as needed.   ? vitamin C (ASCORBIC ACID) 500 MG tablet Take 500 mg by mouth 2 (two) times a week.   ? ?No facility-administered encounter medications on file as of 12/25/2021.  ? ? ?Patient Active Problem List  ? Diagnosis Date Noted  ? Compulsive gambling 11/25/2021  ? Concussion with no loss of consciousness 09/22/2021  ? Persistent headaches 09/22/2021  ? Frequent headaches 09/22/2021  ? Prediabetes 09/22/2021  ? LFTs abnormal 02/06/2021  ? Herpes 01/02/2021  ? Palpitations 11/27/2020  ? Depression, major, single episode, severe ([place]) 10/22/2020  ? Insomnia 10/22/2020  ? Caregiver role strain 07/23/2020  ? Primary osteoarthritis of first carpometacarpal joint of right hand 07/16/2020  ? Annual visit for general adult medical examination with abnormal findings 04/16/2020  ? Postmenopausal 04/16/2020  ? Screening mammogram, encounter for 04/16/2020  ? Atypical mole 04/16/2020  ? Need for immunization against influenza 04/16/2020  ? Constipation 03/17/2020  ? PVD (peripheral vascular disease) ([place]) 03/14/2020  ? CAD (coronary artery disease) 03/14/2020  ? Iron deficiency anemia 03/14/2020  ? DOE (dyspnea on exertion) 03/10/2020  ? H/O adenomatous polyp of colon 01/02/2020  ? FH: colon  cancer 01/02/2020  ? Heart disease 12/11/2019  ? Upper airway cough syndrome 11/13/2019  ? Overweight with body mass index (BMI) of 29 to 29.9 in adult 10/16/2019  ? Essential hypertension 09/10/2019  ? Hyperlipemia 09/10/2019  ? ? ?Conditions to be addressed/monitored: monitor client management of depression issues ? ?Care Plan : LCSW Care Plan  ?Updates made by Katha Cabal, LCSW since 12/25/2021 12:00 AM  ?  ? ?Problem: Emotional Distress   ?  ? ?Goal: Emotional Health Supported.  Manage depression issues   ?Start Date: 12/25/2021  ?Expected End Date: 03/19/2022  ?This Visit's Progress: On track  ?Priority: Medium  ?Note:   ?CARE PLAN ENTRY ? ?Current Barriers:  ?Financial challenges ?Depression issues ?Care giver stress issues ?Suicidal/Homicidal Ideation:  No ? ?Clinical Social Work Goal(s):  ?Over the next 30 days, patient will work with SW in person or via phone to manage depression issues of client ?Client will communicate with SW in next 30 days to discuss client management of daily tasks    ?Client will attend scheduled medical appointments  in next days ?Client will communicate as needed in next 30 days with RNCM to discuss nursing needs of client ? ?Interventions:  ?1:1 collaboration with Vena Rua, FNP regarding development and update of comprehensive plan of care as evidenced by provider attestation and co-signature.  ?Patient interviewed and appropriate assessments performed: PHQ 2/9;  GAD-7 ?LCSW communicated with client today about client needs ?Discussed financial needs of client.  Client stated that she goes to several care providers and that  it can sometimes be expensive just making copay payments to providers.   ?Discussed sleeping issues. She sometimes has difficulty sleeping ?Discussed family support. Has support from son, Mickey Farber ?Reviewed appetite of client. Reviewed pain issues of client.   ?Provided counseling support for client.  ?Reviewed medication procurement of client.    ?Reviewed mood of client.  She takes Prozac as prescribed.  She feels that her mood is stable. She said she is not sad ?Discussed organization of daily tasks.  She said she uses notebooks to plan for ac

## 2021-12-28 DIAGNOSIS — R809 Proteinuria, unspecified: Secondary | ICD-10-CM | POA: Diagnosis not present

## 2021-12-28 DIAGNOSIS — I5032 Chronic diastolic (congestive) heart failure: Secondary | ICD-10-CM | POA: Diagnosis not present

## 2021-12-28 DIAGNOSIS — D508 Other iron deficiency anemias: Secondary | ICD-10-CM | POA: Diagnosis not present

## 2021-12-28 DIAGNOSIS — E1129 Type 2 diabetes mellitus with other diabetic kidney complication: Secondary | ICD-10-CM | POA: Diagnosis not present

## 2021-12-28 DIAGNOSIS — E1122 Type 2 diabetes mellitus with diabetic chronic kidney disease: Secondary | ICD-10-CM | POA: Diagnosis not present

## 2021-12-28 DIAGNOSIS — I129 Hypertensive chronic kidney disease with stage 1 through stage 4 chronic kidney disease, or unspecified chronic kidney disease: Secondary | ICD-10-CM | POA: Diagnosis not present

## 2021-12-28 DIAGNOSIS — N189 Chronic kidney disease, unspecified: Secondary | ICD-10-CM | POA: Diagnosis not present

## 2021-12-29 ENCOUNTER — Ambulatory Visit: Payer: Medicare PPO | Admitting: Family Medicine

## 2021-12-29 VITALS — BP 152/78 | HR 84 | Ht 60.0 in | Wt 160.4 lb

## 2021-12-29 DIAGNOSIS — S060X0D Concussion without loss of consciousness, subsequent encounter: Secondary | ICD-10-CM

## 2021-12-29 NOTE — Progress Notes (Signed)
? ?  I, Peterson Lombard, LAT, ATC acting as a scribe for [name], MD. ? ?Nicole Horn,  is a 76 y.o. female who presents for f/u concussion that occurred on 08/18/21 when she hit the L side of her head on a display at [employer] while bending over to pick something up. Pt was last seen by Dr. Georgina Snell on 11/24/21 and was advised to decrease rehab frequency due to cost and was provided resources to help w/ her gambling addition problem. Pt was d/c from PT on 12/08/21. Today, pt reports she has been feeling pretty good and notes that symptoms have lessened. Pt cont to have slight dizziness intermittently and feels like she should use her cane. ? ?Dx imaging: 08/21/21 Head CT ? ?Injury date : 08/18/22 ?Visit #: 4 ? ?Previous Total # of Symptoms: 18/22 ?Previous Symptom Score: 45/132 ? ?Review of Systems: ? ? ?Pertinent review of systems: no fever or chills ? ?Relevant historical information: PVD.  Heart disease.  Hypertension. ? ? ?Exam:  ?BP (!) 152/78   Pulse 84   Ht 5' (1.524 m)   Wt 160 lb 6.4 oz (72.8 kg)   SpO2 97%   BMI 31.33 kg/m?  ?General: Well Developed, well nourished, and in no acute distress.  ? ?Neuropsych: Alert and oriented normal coordination and gait.  Normal speech thought process and affect. ? ? ? ?Assessment and Plan: ?76 y.o. female with improved concussion.  Not totally resolved but symptoms are improving.  Anxiety and irritability symptoms have improved.  Memory and cognition have improved but not resolved.  Plan to continue Prozac and slow return to normal activity.  Recheck back as needed.  If not improved at the 1 year mark would consider neuropsychological evaluation. ? ? ? ?Discussed warning signs or symptoms. Please see discharge instructions. Patient expresses understanding. ? ? ?The above documentation has been reviewed and is accurate and complete [name], M.D. ? ?Total encounter time 20 minutes including face-to-face time with the patient and, reviewing past medical  record, and charting on the date of service.   ?Treatment plan and options ?

## 2021-12-29 NOTE — Patient Instructions (Addendum)
Thank you for coming in today.  ? ?Recheck as needed.  ? ?Continue fluoxetine.  ? ?

## 2021-12-30 ENCOUNTER — Encounter (HOSPITAL_COMMUNITY): Payer: Self-pay

## 2021-12-30 ENCOUNTER — Inpatient Hospital Stay (HOSPITAL_COMMUNITY): Payer: Medicare PPO

## 2021-12-30 VITALS — BP 142/58 | HR 78 | Temp 98.0°F | Resp 18

## 2021-12-30 DIAGNOSIS — Z8249 Family history of ischemic heart disease and other diseases of the circulatory system: Secondary | ICD-10-CM | POA: Diagnosis not present

## 2021-12-30 DIAGNOSIS — Z79899 Other long term (current) drug therapy: Secondary | ICD-10-CM | POA: Diagnosis not present

## 2021-12-30 DIAGNOSIS — Z823 Family history of stroke: Secondary | ICD-10-CM | POA: Diagnosis not present

## 2021-12-30 DIAGNOSIS — Z803 Family history of malignant neoplasm of breast: Secondary | ICD-10-CM | POA: Diagnosis not present

## 2021-12-30 DIAGNOSIS — D509 Iron deficiency anemia, unspecified: Secondary | ICD-10-CM | POA: Diagnosis not present

## 2021-12-30 DIAGNOSIS — R5383 Other fatigue: Secondary | ICD-10-CM | POA: Diagnosis not present

## 2021-12-30 DIAGNOSIS — Z800 Family history of malignant neoplasm of digestive organs: Secondary | ICD-10-CM | POA: Diagnosis not present

## 2021-12-30 DIAGNOSIS — F419 Anxiety disorder, unspecified: Secondary | ICD-10-CM | POA: Diagnosis not present

## 2021-12-30 DIAGNOSIS — R609 Edema, unspecified: Secondary | ICD-10-CM | POA: Diagnosis not present

## 2021-12-30 MED ORDER — LORATADINE 10 MG PO TABS
10.0000 mg | ORAL_TABLET | Freq: Once | ORAL | Status: AC
  Administered 2021-12-30: 10 mg via ORAL
  Filled 2021-12-30: qty 1

## 2021-12-30 MED ORDER — SODIUM CHLORIDE 0.9 % IV SOLN: Freq: Once | INTRAVENOUS | Status: AC

## 2021-12-30 MED ORDER — SODIUM CHLORIDE 0.9 % IV SOLN
300.0000 mg | Freq: Once | INTRAVENOUS | Status: AC
  Administered 2021-12-30: 300 mg via INTRAVENOUS
  Filled 2021-12-30: qty 300

## 2021-12-30 MED ORDER — ACETAMINOPHEN 325 MG PO TABS
650.0000 mg | ORAL_TABLET | Freq: Once | ORAL | Status: AC
  Administered 2021-12-30: 650 mg via ORAL
  Filled 2021-12-30: qty 2

## 2021-12-30 MED ORDER — ACETAMINOPHEN 325 MG PO TABS: 650.0000 mg | ORAL_TABLET | Freq: Once | ORAL | Status: DC

## 2021-12-30 NOTE — Progress Notes (Signed)
Patient tolerated iron infusion with no complaints voiced.  Peripheral IV site clean and dry with good blood return noted before and after infusion.  Band aid applied.  VSS with discharge and left in satisfactory condition with no s/s of distress noted.   

## 2021-12-30 NOTE — Patient Instructions (Signed)
[facility]  Discharge Instructions: Thank you for choosing [facility] to provide your oncology and hematology care.  If you have a lab appointment with the Cancer Center, please come in thru the Main Entrance and check in at the main information desk.  Wear comfortable clothing and clothing appropriate for easy access to any Portacath or PICC line.   We strive to give you quality time with your provider. You may need to reschedule your appointment if you arrive late (15 or more minutes).  Arriving late affects you and other patients whose appointments are after yours.  Also, if you miss three or more appointments without notifying the office, you may be dismissed from the clinic at the provider's discretion.      For prescription refill requests, have your pharmacy contact our office and allow 72 hours for refills to be completed.    Today you received the following: Venofer, return as scheduled.   To help prevent nausea and vomiting after your treatment, we encourage you to take your nausea medication as directed.  BELOW ARE SYMPTOMS THAT SHOULD BE REPORTED IMMEDIATELY: *FEVER GREATER THAN 100.4 F (38 C) OR HIGHER *CHILLS OR SWEATING *NAUSEA AND VOMITING THAT IS NOT CONTROLLED WITH YOUR NAUSEA MEDICATION *UNUSUAL SHORTNESS OF BREATH *UNUSUAL BRUISING OR BLEEDING *URINARY PROBLEMS (pain or burning when urinating, or frequent urination) *BOWEL PROBLEMS (unusual diarrhea, constipation, pain near the anus) TENDERNESS IN MOUTH AND THROAT WITH OR WITHOUT PRESENCE OF ULCERS (sore throat, sores in mouth, or a toothache) UNUSUAL RASH, SWELLING OR PAIN  UNUSUAL VAGINAL DISCHARGE OR ITCHING   Items with * indicate a potential emergency and should be followed up as soon as possible or go to the Emergency Department if any problems should occur.  Please show the CHEMOTHERAPY ALERT CARD or IMMUNOTHERAPY ALERT CARD at check-in to the Emergency Department and triage  nurse.  Should you have questions after your visit or need to cancel or reschedule your appointment, please contact [facility] [phone]  and follow the prompts.  Office hours are 8:00 a.m. to 4:30 p.m. Monday - Friday. Please note that voicemails left after 4:00 p.m. may not be returned until the following business day.  We are closed weekends and major holidays. You have access to a nurse at all times for urgent questions. Please call the main number to the clinic [phone] and follow the prompts.  For any non-urgent questions, you may also contact your provider using MyChart. We now offer e-Visits for anyone 18 and older to request care online for non-urgent symptoms. For details visit [email or web address].   Also download the MyChart app! Go to the app store, search "MyChart", open the app, select [facility], and log in with your MyChart username and password.  Due to Covid, a mask is required upon entering the hospital/clinic. If you do not have a mask, one will be given to you upon arrival. For doctor visits, patients may have 1 support person aged 18 or older with them. For treatment visits, patients cannot have anyone with them due to current Covid guidelines and our immunocompromised population.  

## 2022-01-01 ENCOUNTER — Ambulatory Visit (HOSPITAL_BASED_OUTPATIENT_CLINIC_OR_DEPARTMENT_OTHER): Payer: Medicare PPO | Admitting: Cardiovascular Disease

## 2022-01-01 VITALS — BP 139/70 | HR 78 | Ht 60.0 in | Wt 160.9 lb

## 2022-01-01 DIAGNOSIS — I10 Essential (primary) hypertension: Secondary | ICD-10-CM

## 2022-01-01 DIAGNOSIS — Z5181 Encounter for therapeutic drug level monitoring: Secondary | ICD-10-CM

## 2022-01-01 DIAGNOSIS — Z006 Encounter for examination for normal comparison and control in clinical research program: Secondary | ICD-10-CM

## 2022-01-01 NOTE — Patient Instructions (Signed)
Medication Instructions:  ?STOP HYDRALAZINE  ? ?START SPIRONOLACTONE 25 MG DAILY  ?  ?Labwork: ?BMET IN 1 WEEK  ?  ?Testing/Procedures: ?Your physician has requested that you have a renal artery duplex. During this test, an ultrasound is used to evaluate blood flow to the kidneys. Allow one hour for this exam. Do not eat after midnight the day before and avoid carbonated beverages. Take your medications as you usually do. ?AT [place]  ?  ?Follow-Up: ?02/01/2022 2:00 PM WITH PHARM D AT [place]  ?  ?You will receive a phone call from the PREP exercise and nutrition program to schedule an initial assessment. ? ? ?Special Instructions:  ? ?MONITOR YOUR BLOOD PRESSURE TWICE A DAY, LOG IN THE BOOK PROVIDED. BRING THE BOOK AND YOUR BLOOD PRESSURE MACHINE TO YOUR FOLLOW UP IN 1 MONTH  ? ?Exercise recommendations: ?The American Heart Association recommends 150 minutes of moderate intensity exercise weekly. ?Try 30 minutes of moderate intensity exercise 4-5 times per week. ?This could include walking, jogging, or swimming. ? ?DASH Eating Plan ?DASH stands for "Dietary Approaches to Stop Hypertension." The DASH eating plan is a healthy eating plan that has been shown to reduce high blood pressure (hypertension). It may also reduce your risk for type 2 diabetes, heart disease, and stroke. The DASH eating plan may also help with weight loss. ?What are tips for following this plan? ? ?General guidelines ?Avoid eating more than 2,300 mg (milligrams) of salt (sodium) a day. If you have hypertension, you may need to reduce your sodium intake to 1,500 mg a day. ?Limit alcohol intake to no more than 1 drink a day for nonpregnant women and 2 drinks a day for men. One drink equals 12 oz of beer, 5 oz of wine, or 1? oz of hard liquor. ?Work with your health care provider to maintain a healthy body weight or to lose weight. Ask what an ideal weight is for you. ?Get at least 30 minutes of exercise that causes your heart to  beat faster (aerobic exercise) most days of the week. Activities may include walking, swimming, or biking. ?Work with your health care provider or diet and nutrition specialist (dietitian) to adjust your eating plan to your individual calorie needs. ?Reading food labels ? ?Check food labels for the amount of sodium per serving. Choose foods with less than 5 percent of the Daily Value of sodium. Generally, foods with less than 300 mg of sodium per serving fit into this eating plan. ?To find whole grains, look for the word "whole" as the first word in the ingredient list. ?Shopping ?Buy products labeled as "low-sodium" or "no salt added." ?Buy fresh foods. Avoid canned foods and premade or frozen meals. ?Cooking ?Avoid adding salt when cooking. Use salt-free seasonings or herbs instead of table salt or sea salt. Check with your health care provider or pharmacist before using salt substitutes. ?Do not fry foods. Cook foods using healthy methods such as baking, boiling, grilling, and broiling instead. ?Cook with heart-healthy oils, such as olive, canola, soybean, or sunflower oil. ?Meal planning ?Eat a balanced diet that includes: ?5 or more servings of fruits and vegetables each day. At each meal, try to fill half of your plate with fruits and vegetables. ?Up to 6-8 servings of whole grains each day. ?Less than 6 oz of lean meat, poultry, or fish each day. A 3-oz serving of meat is about the same size as a deck of cards. One egg equals 1 oz. ?2 servings  of low-fat dairy each day. ?A serving of nuts, seeds, or beans 5 times each week. ?Heart-healthy fats. Healthy fats called Omega-3 fatty acids are found in foods such as flaxseeds and coldwater fish, like sardines, salmon, and mackerel. ?Limit how much you eat of the following: ?Canned or prepackaged foods. ?Food that is high in trans fat, such as fried foods. ?Food that is high in saturated fat, such as fatty meat. ?Sweets, desserts, sugary drinks, and other foods with  added sugar. ?Full-fat dairy products. ?Do not salt foods before eating. ?Try to eat at least 2 vegetarian meals each week. ?Eat more home-cooked food and less restaurant, buffet, and fast food. ?When eating at a restaurant, ask that your food be prepared with less salt or no salt, if possible. ?What foods are recommended? ?The items listed may not be a complete list. Talk with your dietitian about what dietary choices are best for you. ?Grains ?Whole-grain or whole-wheat bread. Whole-grain or whole-wheat pasta. Brown rice. Modena Morrow. Bulgur. Whole-grain and low-sodium cereals. Pita bread. Low-fat, low-sodium crackers. Whole-wheat flour tortillas. ?Vegetables ?Fresh or frozen vegetables (raw, steamed, roasted, or grilled). Low-sodium or reduced-sodium tomato and vegetable juice. Low-sodium or reduced-sodium tomato sauce and tomato paste. Low-sodium or reduced-sodium canned vegetables. ?Fruits ?All fresh, dried, or frozen fruit. Canned fruit in natural juice (without added sugar). ?Meat and other protein foods ?Skinless chicken or [place]. Ground chicken or [place]. Pork with fat trimmed off. Fish and seafood. Egg whites. Dried beans, peas, or lentils. Unsalted nuts, nut butters, and seeds. Unsalted canned beans. Lean cuts of beef with fat trimmed off. Low-sodium, lean deli meat. ?Dairy ?Low-fat (1%) or fat-free (skim) milk. Fat-free, low-fat, or reduced-fat cheeses. Nonfat, low-sodium ricotta or cottage cheese. Low-fat or nonfat yogurt. Low-fat, low-sodium cheese. ?Fats and oils ?Soft margarine without trans fats. Vegetable oil. Low-fat, reduced-fat, or light mayonnaise and salad dressings (reduced-sodium). Canola, safflower, olive, soybean, and sunflower oils. Avocado. ?Seasoning and other foods ?Herbs. Spices. Seasoning mixes without salt. Unsalted popcorn and pretzels. Fat-free sweets. ?What foods are not recommended? ?The items listed may not be a complete list. Talk with your dietitian about what dietary  choices are best for you. ?Grains ?Baked goods made with fat, such as croissants, muffins, or some breads. Dry pasta or rice meal packs. ?Vegetables ?Creamed or fried vegetables. Vegetables in a cheese sauce. Regular canned vegetables (not low-sodium or reduced-sodium). Regular canned tomato sauce and paste (not low-sodium or reduced-sodium). Regular tomato and vegetable juice (not low-sodium or reduced-sodium). Angie Fava. Olives. ?Fruits ?Canned fruit in a light or heavy syrup. Fried fruit. Fruit in cream or butter sauce. ?Meat and other protein foods ?Fatty cuts of meat. Ribs. Fried meat. Berniece Salines. Sausage. Bologna and other processed lunch meats. Salami. Fatback. Hotdogs. Bratwurst. Salted nuts and seeds. Canned beans with added salt. Canned or smoked fish. Whole eggs or egg yolks. Chicken or [place] with skin. ?Dairy ?Whole or 2% milk, cream, and half-and-half. Whole or full-fat cream cheese. Whole-fat or sweetened yogurt. Full-fat cheese. Nondairy creamers. Whipped toppings. Processed cheese and cheese spreads. ?Fats and oils ?Butter. Stick margarine. Lard. Shortening. Ghee. Bacon fat. Tropical oils, such as coconut, palm kernel, or palm oil. ?Seasoning and other foods ?Salted popcorn and pretzels. Onion salt, garlic salt, seasoned salt, table salt, and sea salt. Worcestershire sauce. Tartar sauce. Barbecue sauce. Teriyaki sauce. Soy sauce, including reduced-sodium. Steak sauce. Canned and packaged gravies. Fish sauce. Oyster sauce. Cocktail sauce. Horseradish that you find on the shelf. Ketchup. Mustard. Meat flavorings and tenderizers. Bouillon  cubes. Hot sauce and Tabasco sauce. Premade or packaged marinades. Premade or packaged taco seasonings. Relishes. Regular salad dressings. ?Where to find more information: ?National Heart, Lung, and Blood Institute: https://wilson-eaton.com/ ?American Heart Association: www.heart.org ?Summary ?The DASH eating plan is a healthy eating plan that has been shown to reduce high blood  pressure (hypertension). It may also reduce your risk for type 2 diabetes, heart disease, and stroke. ?With the DASH eating plan, you should limit salt (sodium) intake to 2,300 mg a day. If you have hyperten

## 2022-01-01 NOTE — Assessment & Plan Note (Signed)
Blood pressure slightly above goal today both initially and on repeat.  Her blood pressure needs to be less than 130/90.  She is often forgets to take her afternoon dose of hydralazine.  We will discontinue this medication.  She has not been taking spironolactone though it is on her medication list.  She is going to start taking it and recheck a basic metabolic panel in a week.  Olmesartan/HCTZ and quinapril are both on her list.  Lately she has only been taking quinapril.  She also has a history of some renal dysfunction that has recently normalized.  She follows with nephrology.  Therefore we will keep her on amlodipine 10 mg daily, quinapril 20 mg daily, and she will resume spironolactone 25 mg daily.  Check basic metabolic panel in a week.  She is interested in enrolling in the prep program through the [facility] in [place].  We will have her get renal artery Dopplers checked to evaluate for renal artery stenosis.  Given that she is on quinapril and starting spironolactone, we will hold off on checking for hyperaldosteronism at this time.  There is no evidence of any adenomas on her CT scan a couple years ago. ?

## 2022-01-01 NOTE — Research (Signed)
?  Subject Name: Nicole Horn ? ?Nicole Horn met inclusion and exclusion criteria for the Virtual Care and Social Determinant Interventions for the management of hypertension trial.  The informed consent form, study requirements and expectations were reviewed with the subject by Dr. Oval Linsey and myself. The subject was given the opportunity to read the consent and ask questions. The subject verbalized understanding of the trial requirements.  All questions were addressed prior to the signing of the consent form. The subject agreed to participate in the trial and signed the informed consent. The informed consent was obtained prior to performance of any protocol-specific procedures for the subject.  A copy of the signed informed consent was given to the subject and a copy was placed in the subject's medical record.  ?Nicole Horn was randomized to Group 1.  ? ?

## 2022-01-01 NOTE — Progress Notes (Unsigned)
? ?Advanced Hypertension Clinic Initial Assessment:   ? ?Date:  01/01/2022  ? ?ID:  Nicole Horn, DOB [birth year], MRN [id number] ? ?PCP:  Renee Rival, FNP  ?Cardiologist:  Rozann Lesches, MD  ?Nephrologist: ? ?Referring MD: Noreene Larsson, NP  ? ?CC: Hypertension ? ?History of Present Illness:   ? ?Nicole Horn is a 76 y.o. female with a hx of CAD, hypertension, hyperlipidemia, PAD, OSA, and diabetes here to establish care in the Advanced Hypertension Clinic. She was first diagnosed with hypertension in her 36s.  At the time she was under a lot of stress.  She saw Dr. Domenic Polite 07/2021 her blood pressure was 158/86.  At that time she was on olmesartan and HCTZ  She hasn't been checking it regularly.  ? ?She had a prior MI in 2004 with subsequent cardiac catheterization but did not require PCI.  She has known PAD and has had stents in bilateral lower extremities.  These were performed in [place].  She moved to [place] to take care of her aging mother. ? ?No claudication ?She does low impact exericse and stationalry bike three days per week.   ?She has no exertional chest pain or shortness of breath.   ? ? ?She limits the salt in her diet. ? ?She had two blood transfusions last year.  She is getting iron infusions.  ?COVID and then oxygen ? ?She has not been taking spironolactone or meloxicam.  She notes that she has a prescription for olmesartan/HCTZ but has not been taking that either.  On a daily basis she takes amlodipine, hydralazine twice a day (often forgets to take her afternoon dose), and quinapril. ?Abd CT was negative 02/2020 ? ?Previous antihypertensives: ?Olmesartan/HCTZ ?Spironolactone ? ?Secondary Causes of Hypertension ? ?Medications/Herbal: OCP, steroids, stimulants, antidepressants, weight loss medication, immune suppressants, NSAIDs, sympathomimetics, alcohol, caffeine, licorice, ginseng, St. John's wort, chemo ? ?Sleep Apnea ?Renal artery  stenosis ?Hyperaldosteronism ?Hyper/hypothyroidism ?Pheochromocytoma: palpitations, tachycardia, headache, diaphoresis (plasma metanephrines) ?Cushing's syndrome: Cushingoid facies, central obesity, proximal muscle weakness, and ecchymoses, adrenal incidentaloma (cortisol) ?Coarctation of the aorta ? ?Past Medical History:  ?Diagnosis Date  ? 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 Chronic thumb pain, right   ? Essential hypertension   ? Heart attack [facility]) 2004  ? [place] - hospitalized with stress test by report and presumably medical therapy; subsequent cardiac catheterization (no PCI)  ? History of COVID-19 08/2019  ? History of irregular heartbeat   ? History of syphilis   ? Mixed hyperlipidemia   ? PAD (peripheral artery disease) ([place]) 2019  ? PTCA/stent [place]  ? Pneumonia due to COVID-19 virus 09/10/2019  ? Sleep apnea   ? Pt supposed to wear CPAP, but doesn't.  ? Type 2 diabetes mellitus ([place])   ? ? ?Past Surgical History:  ?Procedure Laterality Date  ? ABDOMINAL HYSTERECTOMY    ? BACK SURGERY    ? cervical fuision  ? BARTHOLIN GLAND CYST EXCISION Left 03/04/2015  ? Procedure: EXCISION OF LEFT BARTHOLINS TUMOR;  Surgeon: [name], MD;  Location: [facility];  Service: Gynecology;  Laterality: Left;  ? CATARACT EXTRACTION W/PHACO Right 03/27/2021  ? Procedure: CATARACT EXTRACTION PHACO AND INTRAOCULAR LENS PLACEMENT (IOC);  Surgeon: Baruch Goldmann, MD;  Location: [facility];  Service: Ophthalmology;  Laterality: Right;  cde 8.45  ? CATARACT EXTRACTION W/PHACO Left 11/06/2021  ? Procedure: CATARACT EXTRACTION PHACO AND INTRAOCULAR LENS PLACEMENT (IOC);  Surgeon: Baruch Goldmann, MD;  Location: [facility];  Service: Ophthalmology;  Laterality: Left;  CDE:13.69  ? CERVICAL SPINE SURGERY    ? COLONOSCOPY N/A 02/01/2020  ? Procedure: COLONOSCOPY;  Surgeon: Daneil Dolin, MD; Scattered medium mouth diverticula in the sigmoid and descending colon, otherwise normal exam.  ? CYST REMOVAL TRUNK    ?  ESOPHAGOGASTRODUODENOSCOPY (EGD) WITH PROPOFOL N/A 03/27/2020  ? Procedure: ESOPHAGOGASTRODUODENOSCOPY (EGD) WITH PROPOFOL;  Surgeon: Daneil Dolin, MD;  Location: [facility];  Service: Endoscopy;  Laterality: N/A;  1:00pm  ? GIVENS CAPSULE STUDY N/A 03/27/2020  ? Procedure: GIVENS CAPSULE STUDY;  Surgeon: Daneil Dolin, MD;  Location: [facility];  Service: Endoscopy;  Laterality: N/A;  ? Multiple cyst removal surgeries    ? Stent of lower extremities    ? ? ?Current Medications: ?No outpatient medications have been marked as taking for the 01/01/22 encounter (Appointment) with Skeet Latch, MD.  ?  ? ?Allergies:   Patient has no known allergies.  ? ?Social History  ? ?Socioeconomic History  ? Marital status: Widowed  ?  Spouse name: Not on file  ? Number of children: 1  ? Years of education: Not on file  ? Highest education level: Some college, no degree  ?Occupational History  ? Not on file  ?Tobacco Use  ? Smoking status: Former  ?  Packs/day: 2.00  ?  Years: 25.00  ?  Pack years: 50.00  ?  Types: Cigarettes  ?  Quit date: 03/10/1985  ?  Years since quitting: 36.8  ? Smokeless tobacco: Never  ?Vaping Use  ? Vaping Use: Never used  ?Substance and Sexual Activity  ? Alcohol use: Not Currently  ?  Alcohol/week: 0.0 standard drinks  ?  Comment: socially  ? Drug use: No  ? Sexual activity: Yes  ?  Birth control/protection: Surgical  ?Other Topics Concern  ? Not on file  ?Social History Narrative  ? Lives in [place] and [place] 6 months here and there owns an apt in [place]  ? Lives alone, but helps to care for mother who is 65 years (goes between the kids)  ? Sister is in [place]  ? Brother is in [place]  ?   ? Son who is 57 years old lives in [place], [place]   ? Has 6 grand kids and 7 great grands  ?   ? Enjoys exercise (harder since COVID)-dancing  ?   ? Diet: eats all foods-enjoys smoothies, avoids fried foods, limited red meat if any  ? Caffeine: coffee 1 cup in the morning-sometimes will have 1  in evening   ? Water: 3-4 bottles daily   ?   ? Wears seat belt  ? Smoke detectors in home  ? Does not use phone while driving  ?   ?   ?   ? ?Social Determinants of Health  ? ?Financial Resource Strain: Medium Risk  ? Difficulty of Paying Living Expenses: Somewhat hard  ?Food Insecurity: No Food Insecurity  ? Worried About Charity fundraiser in the Last Year: Never true  ? Ran Out of Food in the Last Year: Never true  ?Transportation Needs: No Transportation Needs  ? Lack of Transportation (Medical): No  ? Lack of Transportation (Non-Medical): No  ?Physical Activity: Insufficiently Active  ? Days of Exercise per Week: 4 days  ? Minutes of Exercise per Session: 20 min  ?Stress: Stress Concern Present  ? Feeling of Stress : Rather much  ?Social Connections: Unknown  ? Frequency of Communication  with Friends and Family: Three times a week  ? Frequency of Social Gatherings with Friends and Family: Three times a week  ? Attends Religious Services: More than 4 times per year  ? Active Member of Clubs or Organizations: Yes  ? Attends Archivist Meetings: 1 to 4 times per year  ? Marital Status: Not on file  ?  ? ?Family History: ?The patient's ***family history includes Breast cancer in her sister; Colon cancer in her mother; Healthy in her brother; Heart attack in her paternal grandmother; Stomach cancer in her maternal aunt; Stroke in her paternal grandmother. ? ?ROS:   ?Please see the history of present illness.    ?*** All other systems reviewed and are negative. ? ?EKGs/Labs/Other Studies Reviewed:   ? ?EKG:  EKG is ordered today.  The ekg ordered today demonstrates sinus rhythm.  Rate 78 bpm.  Lateral T wave inversions. ? ?Echocardiogram 03/04/2020: ? 1. Left ventricular ejection fraction, by estimation, is 60 to 65%. The  ?left ventricle has normal function. The left ventricle has no regional  ?wall motion abnormalities. There is mild left ventricular hypertrophy.  ?Left ventricular diastolic parameters   ?are consistent with Grade I diastolic dysfunction (impaired relaxation).  ? 2. Right ventricular systolic function is normal. The right ventricular  ?size is normal. There is normal pulmonary artery systolic pressure.  ? 3. Right atrial siz

## 2022-01-05 ENCOUNTER — Telehealth: Payer: Self-pay

## 2022-01-05 NOTE — Telephone Encounter (Signed)
Call to pt reference PREP referral ?LVMT requesting call back ?Just started class in [place], if she wants to do this one vs waiting for next  ?

## 2022-01-07 ENCOUNTER — Encounter (HOSPITAL_COMMUNITY): Payer: Self-pay

## 2022-01-07 ENCOUNTER — Inpatient Hospital Stay (HOSPITAL_COMMUNITY): Payer: Medicare PPO

## 2022-01-07 VITALS — BP 146/57 | HR 72 | Temp 98.3°F | Resp 18

## 2022-01-07 DIAGNOSIS — Z8249 Family history of ischemic heart disease and other diseases of the circulatory system: Secondary | ICD-10-CM | POA: Diagnosis not present

## 2022-01-07 DIAGNOSIS — D509 Iron deficiency anemia, unspecified: Secondary | ICD-10-CM

## 2022-01-07 DIAGNOSIS — Z823 Family history of stroke: Secondary | ICD-10-CM | POA: Diagnosis not present

## 2022-01-07 DIAGNOSIS — R609 Edema, unspecified: Secondary | ICD-10-CM | POA: Diagnosis not present

## 2022-01-07 DIAGNOSIS — F419 Anxiety disorder, unspecified: Secondary | ICD-10-CM | POA: Diagnosis not present

## 2022-01-07 DIAGNOSIS — R5383 Other fatigue: Secondary | ICD-10-CM | POA: Diagnosis not present

## 2022-01-07 DIAGNOSIS — Z79899 Other long term (current) drug therapy: Secondary | ICD-10-CM | POA: Diagnosis not present

## 2022-01-07 DIAGNOSIS — Z803 Family history of malignant neoplasm of breast: Secondary | ICD-10-CM | POA: Diagnosis not present

## 2022-01-07 DIAGNOSIS — Z800 Family history of malignant neoplasm of digestive organs: Secondary | ICD-10-CM | POA: Diagnosis not present

## 2022-01-07 MED ORDER — SODIUM CHLORIDE 0.9 % IV SOLN: Freq: Once | INTRAVENOUS | Status: AC

## 2022-01-07 MED ORDER — SODIUM CHLORIDE 0.9 % IV SOLN
300.0000 mg | Freq: Once | INTRAVENOUS | Status: AC
  Administered 2022-01-07: 300 mg via INTRAVENOUS
  Filled 2022-01-07: qty 300

## 2022-01-07 MED ORDER — LORATADINE 10 MG PO TABS
10.0000 mg | ORAL_TABLET | Freq: Once | ORAL | Status: AC
  Administered 2022-01-07: 10 mg via ORAL
  Filled 2022-01-07: qty 1

## 2022-01-07 MED ORDER — ACETAMINOPHEN 325 MG PO TABS
650.0000 mg | ORAL_TABLET | Freq: Once | ORAL | Status: AC
  Administered 2022-01-07: 650 mg via ORAL
  Filled 2022-01-07: qty 2

## 2022-01-07 NOTE — Patient Instructions (Signed)
[facility]  Discharge Instructions: Thank you for choosing [facility] to provide your oncology and hematology care.  If you have a lab appointment with the Cancer Center, please come in thru the Main Entrance and check in at the main information desk.  Wear comfortable clothing and clothing appropriate for easy access to any Portacath or PICC line.   We strive to give you quality time with your provider. You may need to reschedule your appointment if you arrive late (15 or more minutes).  Arriving late affects you and other patients whose appointments are after yours.  Also, if you miss three or more appointments without notifying the office, you may be dismissed from the clinic at the provider's discretion.      For prescription refill requests, have your pharmacy contact our office and allow 72 hours for refills to be completed.    Today you received the following: Venofer, return as scheduled.   To help prevent nausea and vomiting after your treatment, we encourage you to take your nausea medication as directed.  BELOW ARE SYMPTOMS THAT SHOULD BE REPORTED IMMEDIATELY: *FEVER GREATER THAN 100.4 F (38 C) OR HIGHER *CHILLS OR SWEATING *NAUSEA AND VOMITING THAT IS NOT CONTROLLED WITH YOUR NAUSEA MEDICATION *UNUSUAL SHORTNESS OF BREATH *UNUSUAL BRUISING OR BLEEDING *URINARY PROBLEMS (pain or burning when urinating, or frequent urination) *BOWEL PROBLEMS (unusual diarrhea, constipation, pain near the anus) TENDERNESS IN MOUTH AND THROAT WITH OR WITHOUT PRESENCE OF ULCERS (sore throat, sores in mouth, or a toothache) UNUSUAL RASH, SWELLING OR PAIN  UNUSUAL VAGINAL DISCHARGE OR ITCHING   Items with * indicate a potential emergency and should be followed up as soon as possible or go to the Emergency Department if any problems should occur.  Please show the CHEMOTHERAPY ALERT CARD or IMMUNOTHERAPY ALERT CARD at check-in to the Emergency Department and triage  nurse.  Should you have questions after your visit or need to cancel or reschedule your appointment, please contact [facility] [phone]  and follow the prompts.  Office hours are 8:00 a.m. to 4:30 p.m. Monday - Friday. Please note that voicemails left after 4:00 p.m. may not be returned until the following business day.  We are closed weekends and major holidays. You have access to a nurse at all times for urgent questions. Please call the main number to the clinic [phone] and follow the prompts.  For any non-urgent questions, you may also contact your provider using MyChart. We now offer e-Visits for anyone 18 and older to request care online for non-urgent symptoms. For details visit [email or web address].   Also download the MyChart app! Go to the app store, search "MyChart", open the app, select [facility], and log in with your MyChart username and password.  Due to Covid, a mask is required upon entering the hospital/clinic. If you do not have a mask, one will be given to you upon arrival. For doctor visits, patients may have 1 support person aged 18 or older with them. For treatment visits, patients cannot have anyone with them due to current Covid guidelines and our immunocompromised population.  

## 2022-01-07 NOTE — Progress Notes (Signed)
Patient tolerated iron infusion with no complaints voiced.  Peripheral IV site clean and dry with good blood return noted before and after infusion.  Band aid applied.  VSS with discharge and left in satisfactory condition with no s/s of distress noted.   

## 2022-01-08 ENCOUNTER — Ambulatory Visit (HOSPITAL_BASED_OUTPATIENT_CLINIC_OR_DEPARTMENT_OTHER): Payer: Medicare PPO | Admitting: Cardiovascular Disease

## 2022-01-14 ENCOUNTER — Ambulatory Visit (HOSPITAL_COMMUNITY)
Admission: RE | Admit: 2022-01-14 | Discharge: 2022-01-14 | Disposition: A | Discharge status: Home / Self Care | Payer: Medicare PPO | Source: Ambulatory Visit | Attending: Cardiovascular Disease | Admitting: Cardiovascular Disease

## 2022-01-14 ENCOUNTER — Other Ambulatory Visit (HOSPITAL_COMMUNITY)
Admission: RE | Admit: 2022-01-14 | Discharge: 2022-01-14 | Disposition: A | Discharge status: Home / Self Care | Payer: Medicare PPO | Source: Ambulatory Visit | Attending: Cardiovascular Disease | Admitting: Cardiovascular Disease

## 2022-01-14 DIAGNOSIS — R932 Abnormal findings on diagnostic imaging of liver and biliary tract: Secondary | ICD-10-CM | POA: Insufficient documentation

## 2022-01-14 DIAGNOSIS — Z5181 Encounter for therapeutic drug level monitoring: Secondary | ICD-10-CM | POA: Insufficient documentation

## 2022-01-14 DIAGNOSIS — D509 Iron deficiency anemia, unspecified: Secondary | ICD-10-CM | POA: Diagnosis not present

## 2022-01-14 DIAGNOSIS — I10 Essential (primary) hypertension: Secondary | ICD-10-CM | POA: Insufficient documentation

## 2022-01-14 LAB — CBC WITH DIFFERENTIAL/PLATELET
Abs Immature Granulocytes: 0 10*3/uL (ref 0.00–0.07)
Basophils Absolute: 0.1 10*3/uL (ref 0.0–0.1)
Basophils Relative: 1 %
Eosinophils Absolute: 0.1 10*3/uL (ref 0.0–0.5)
Eosinophils Relative: 2 %
HCT: 40.8 % (ref 36.0–46.0)
Hemoglobin: 11.9 g/dL — ABNORMAL LOW (ref 12.0–15.0)
Immature Granulocytes: 0 %
Lymphocytes Relative: 38 %
Lymphs Abs: 1.5 10*3/uL (ref 0.7–4.0)
MCH: 24.8 pg — ABNORMAL LOW (ref 26.0–34.0)
MCHC: 29.2 g/dL — ABNORMAL LOW (ref 30.0–36.0)
MCV: 85 fL (ref 80.0–100.0)
Monocytes Absolute: 0.4 10*3/uL (ref 0.1–1.0)
Monocytes Relative: 9 %
Neutro Abs: 1.9 10*3/uL (ref 1.7–7.7)
Neutrophils Relative %: 50 %
Platelets: 310 10*3/uL (ref 150–400)
RBC: 4.8 MIL/uL (ref 3.87–5.11)
RDW: 17.2 % — ABNORMAL HIGH (ref 11.5–15.5)
WBC: 3.9 10*3/uL — ABNORMAL LOW (ref 4.0–10.5)
nRBC: 0 % (ref 0.0–0.2)

## 2022-01-14 LAB — IRON AND TIBC
Iron: 90 ug/dL (ref 28–170)
Saturation Ratios: 23 % (ref 10.4–31.8)
TIBC: 398 ug/dL (ref 250–450)
UIBC: 308 ug/dL

## 2022-01-14 LAB — FERRITIN: Ferritin: 359 ng/mL — ABNORMAL HIGH (ref 11–307)

## 2022-01-20 ENCOUNTER — Ambulatory Visit: Payer: Medicare PPO | Admitting: Nurse Practitioner

## 2022-01-20 DIAGNOSIS — F32A Depression, unspecified: Secondary | ICD-10-CM

## 2022-01-21 ENCOUNTER — Encounter: Payer: Self-pay | Admitting: Nurse Practitioner

## 2022-01-21 ENCOUNTER — Ambulatory Visit (INDEPENDENT_AMBULATORY_CARE_PROVIDER_SITE_OTHER): Payer: Medicare PPO | Admitting: Nurse Practitioner

## 2022-01-21 VITALS — BP 121/69 | HR 91 | Ht 60.0 in | Wt 161.0 lb

## 2022-01-21 DIAGNOSIS — D229 Melanocytic nevi, unspecified: Secondary | ICD-10-CM | POA: Diagnosis not present

## 2022-01-21 DIAGNOSIS — R040 Epistaxis: Secondary | ICD-10-CM

## 2022-01-21 DIAGNOSIS — K760 Fatty (change of) liver, not elsewhere classified: Secondary | ICD-10-CM

## 2022-01-21 DIAGNOSIS — I10 Essential (primary) hypertension: Secondary | ICD-10-CM

## 2022-01-21 DIAGNOSIS — E785 Hyperlipidemia, unspecified: Secondary | ICD-10-CM

## 2022-01-21 DIAGNOSIS — E669 Obesity, unspecified: Secondary | ICD-10-CM

## 2022-01-21 DIAGNOSIS — R7303 Prediabetes: Secondary | ICD-10-CM

## 2022-01-21 NOTE — Assessment & Plan Note (Signed)
Lab Results  Component Value Date   CHOL 115 08/10/2021   HDL 47 (L) 08/10/2021   LDLCALC 50 08/10/2021   TRIG 99 08/10/2021   CHOLHDL 2.4 08/10/2021  Patient well-controlled on rosuvastatin 20 mg daily Continue current medication avoid fried fatty foods

## 2022-01-21 NOTE — Patient Instructions (Signed)

## 2022-01-21 NOTE — Assessment & Plan Note (Signed)
No moles noted on her face Has multiple moles on her breasts Patient referred to dermatology at her request

## 2022-01-21 NOTE — Assessment & Plan Note (Signed)
BP Readings from Last 3 Encounters:  01/21/22 121/69  01/07/22 (!) 146/57  01/01/22 139/70  Blood pressure well controlled on enalapril 20 mg daily, amlodipine 10 mg daily.  States that cardiology told her to hold off on spironolactone.  Also taking Jardiance 10 mg daily. Continue current medications DASH diet advised Appreciate collaboration with cardiology and nephrology

## 2022-01-21 NOTE — Assessment & Plan Note (Addendum)
Lab Results  Component Value Date   HGBA1C 5.7 (H) 12/12/2019  Chronic condition well-controlled Avoid sugar sweets soda On Jardiance 10 mg daily for CKD, CHF

## 2022-01-21 NOTE — Assessment & Plan Note (Addendum)
Incidentally found on renal ultrasound Denies abdominal  complaints  Patient encouraged to avoid alcohol, engage in regular exercise as tolerated to help lose weight Referred to GI

## 2022-01-21 NOTE — Progress Notes (Signed)
Nicole Horn     MRN: [id number]      DOB: [birth year]   HPI Nicole Horn with past medical history of essential hypertension, PVD, CAD, obesity, prediabetes is here for follow up and re-evaluation of chronic medical conditions, medication management and review of any available recent lab and radiology data.   Patient stated that she had nose bleeds when she rubs her nose, states that she had this problem in the past, bleeding stopped for a while and restarted  last month, she gets bleeding under control by packing her nostril with tissue and holding her head back . She has experienced nose bleeding twice in the past one month.  Currently on aspirin 81 mg, Plavix 75 mg daily.  Patient denies bloody stool, hematemesis, abdominal pain.  Hepatic steatosis incidentally found on recent imaging , patient stated that she drinks alcohol occasionally , denies abdominal pain , bloody stools, nausea, diarrhea.  Patient confirmed that she is aware  of her condition.  States that she has never been evaluated by GI   Skin moles has some moles on her breasts,and face , states that she has noticed that the moles are getting bigger in size would like the moles on her face removed.  Patient denies changes in the color of her moles  ROS Denies recent fever or chills. Denies sinus pressure, nasal congestion, ear pain or sore throat. Denies chest congestion, productive cough or wheezing. Denies chest pains, palpitations and leg swelling Denies abdominal pain, nausea, vomiting,diarrhea or constipation.   Denies dysuria, frequency, hesitancy or incontinence. Denies joint pain, swelling and limitation in mobility. Denies depression, anxiety or insomnia.    PE  BP 121/69 (BP Location: Right Arm, Patient Position: Sitting, Cuff Size: Normal)   Pulse 91   Ht 5' (1.524 m)   Wt 161 lb (73 kg)   SpO2 90%   BMI 31.44 kg/m   Patient alert and oriented and in no cardiopulmonary  distress.  Chest: Clear to auscultation bilaterally.  CVS: S1, S2 no murmurs, no S3.Regular rate.  ABD: Soft non tender.   Ext: No edema  MS: Adequate ROM spine, shoulders, hips and knees.  Skin: Multiple flat brown-colored moles noted on breasts, no discharge noted  Psych: Good eye contact, normal affect. Memory intact not anxious or depressed appearing.    Assessment & Plan  Essential hypertension BP Readings from Last 3 Encounters:  01/21/22 121/69  01/07/22 (!) 146/57  01/01/22 139/70  Blood pressure well controlled on enalapril 20 mg daily, amlodipine 10 mg daily.  States that cardiology told her to hold off on spironolactone.  Also taking Jardiance 10 mg daily. Continue current medications DASH diet advised Appreciate collaboration with cardiology and nephrology  Hepatic steatosis Incidentally found on renal ultrasound Denies abdominal  complaints  Patient encouraged to avoid alcohol, engage in regular exercise as tolerated to help lose weight Referred to GI  Hyperlipemia Lab Results  Component Value Date   CHOL 115 08/10/2021   HDL 47 (L) 08/10/2021   LDLCALC 50 08/10/2021   TRIG 99 08/10/2021   CHOLHDL 2.4 08/10/2021  Patient well-controlled on rosuvastatin 20 mg daily Continue current medication avoid fried fatty foods  Obesity (BMI 30-39.9) Wt Readings from Last 3 Encounters:  01/21/22 161 lb (73 kg)  01/01/22 160 lb 14.4 oz (73 kg)  12/29/21 160 lb 6.4 oz (72.8 kg)  Patient counseled on low-carb diet, encouraged to exercise at least 150 minutes weekly as tolerated   Atypical mole No  moles noted on her face Has multiple moles on her breasts Patient referred to dermatology at her request  Prediabetes Lab Results  Component Value Date   HGBA1C 5.7 (H) 12/12/2019  Chronic condition well-controlled Avoid sugar sweets soda On Jardiance 10 mg daily for CKD, CHF    Epistaxis Chronic condition Has had 2 episodes of nasal bleeding in the past 1  month Currently on aspirin and Plavix for CAD and PVD Patient told to avoid rubbing her nose to prevent bleeding Educational material for epistaxis given Patient encouraged to discuss this with cardiology at her upcoming appointment Lab Results  Component Value Date   WBC 3.9 (L) 01/14/2022   HGB 11.9 (L) 01/14/2022   HCT 40.8 01/14/2022   MCV 85.0 01/14/2022   PLT 310 01/14/2022

## 2022-01-21 NOTE — Assessment & Plan Note (Signed)
Wt Readings from Last 3 Encounters:  01/21/22 161 lb (73 kg)  01/01/22 160 lb 14.4 oz (73 kg)  12/29/21 160 lb 6.4 oz (72.8 kg)  Patient counseled on low-carb diet, encouraged to exercise at least 150 minutes weekly as tolerated

## 2022-01-21 NOTE — Assessment & Plan Note (Signed)
Chronic condition Has had 2 episodes of nasal bleeding in the past 1 month Currently on aspirin and Plavix for CAD and PVD Patient told to avoid rubbing her nose to prevent bleeding Educational material for epistaxis given Patient encouraged to discuss this with cardiology at her upcoming appointment Lab Results  Component Value Date   WBC 3.9 (L) 01/14/2022   HGB 11.9 (L) 01/14/2022   HCT 40.8 01/14/2022   MCV 85.0 01/14/2022   PLT 310 01/14/2022

## 2022-01-25 ENCOUNTER — Encounter: Payer: Self-pay | Admitting: Internal Medicine

## 2022-02-01 ENCOUNTER — Ambulatory Visit (INDEPENDENT_AMBULATORY_CARE_PROVIDER_SITE_OTHER): Payer: Medicare PPO | Admitting: Pharmacist Clinician (PhC)/ Clinical Pharmacy Specialist

## 2022-02-01 ENCOUNTER — Encounter: Payer: Self-pay | Admitting: Pharmacist Clinician (PhC)/ Clinical Pharmacy Specialist

## 2022-02-01 VITALS — BP 154/76 | Ht 60.0 in | Wt 160.0 lb

## 2022-02-01 DIAGNOSIS — I10 Essential (primary) hypertension: Secondary | ICD-10-CM | POA: Diagnosis not present

## 2022-02-01 MED ORDER — SPIRONOLACTONE 25 MG PO TABS: 25.0000 mg | ORAL_TABLET | Freq: Every day | ORAL | 3 refills | Status: DC

## 2022-02-01 NOTE — Assessment & Plan Note (Addendum)
Patient with essential hypertension, currently only on amlodipine 10 mg daily.  Had long discussion about BP and the need to take multiple medications to achieve goal.  Reviewed risk of further cardiac damage with uncontrolled readings for long periods of time.  Will have her discontinue quinapril (has been widely recalled recently) and have re re-start the spironolactone.  Will repeat metabolic panel in 2 weeks and see her back in the office in 1 month for follow up.

## 2022-02-01 NOTE — Patient Instructions (Addendum)
Return for a a follow up appointment Monday July 24 at 2 pm  Go to the lab in 2 weeks to check kidney function (about June 26)  Check your blood pressure at home daily and keep record of the readings.  Take your BP meds as follows:  Start Spironolactone 25 mg once daily  Continue with all other medications  Bring all of your meds, your BP cuff and your record of home blood pressures to your next appointment.  Exercise as you're able, try to walk approximately 30 minutes per day.  Keep salt intake to a minimum, especially watch canned and prepared boxed foods.  Eat more fresh fruits and vegetables and fewer canned items.  Avoid eating in fast food restaurants.    HOW TO TAKE YOUR BLOOD PRESSURE: Rest 5 minutes before taking your blood pressure.  Don't smoke or drink caffeinated beverages for at least 30 minutes before. Take your blood pressure before (not after) you eat. Sit comfortably with your back supported and both feet on the floor (don't cross your legs). Elevate your arm to heart level on a table or a desk. Use the proper sized cuff. It should fit smoothly and snugly around your bare upper arm. There should be enough room to slip a fingertip under the cuff. The bottom edge of the cuff should be 1 inch above the crease of the elbow. Ideally, take 3 measurements at one sitting and record the averag

## 2022-02-01 NOTE — Progress Notes (Signed)
   02/01/2022 Nicole Horn 06/03/1946 [id number]   HPI:  Nicole Horn is a 75 y.o. female patient of Dr Ames, with a PMH below who presents today for advanced hypertension clinic follow up.   She was seen by Dr. Dennison last month and noted to have a BP of 139/70.  Patient reported having history of hypertension, going back to when she was in her 20's.  When she saw Dr. Wann there was some confusion about whether she was taking her medications.  She was asked to stop hydralazine and restart the spironolactone 25 mg daily.  She was agreeable to join our [employer] research and was randomized to group 1.    She returns today for follow up.  She has not re-started the spironolactone, nor is she taking quinapril.  It appears that she is only on amlodipine 10 mg for her blood pressure.  She doesn't know why she isn't taking the other two medications.    Past Medical History: CAD MI 2004, no PCI  hyperlipidemia 4/23 LDL 51 on rosuvastatin 40  PAD Bilateral lower extremity stents  DM2 4/23 A1c 6.3 on empagliflozin  OSA     Blood Pressure Goal:  130/80  Current Medications: amlodipine 10 mg qd,  Family Hx: mother has hypertension, now 94;  both maternal grandparents, sister (even though she's a "health freak") ; father history unknown; son with hypertension,   Social Hx: no tobacco (quit at age 36), occasional cocktail, coffee 1 cup/day,   Diet: mox of home and out;  smoothies in the mornign, balanced inner in evenings, has been eating out more, tries to make low sodium healthy choices  Exercise: low impact exercise and stationary bike 3 days/week; tries to do something most days  Home BP readings: 34 home readings, mix of morning and evening  Average 160/84 (range 142-197/67-103)  HR 82  Intolerances: nkda  Labs: 4/23: Na 144, K 4.1, Glu 114, BUN 19, SCr 1.15 GFR 50   Wt Readings from Last 3 Encounters:  02/01/22 160 lb (72.6 kg)  01/21/22 161 lb (73  kg)  01/01/22 160 lb 14.4 oz (73 kg)   BP Readings from Last 3 Encounters:  02/01/22 (!) 154/76  01/21/22 121/69  01/07/22 (!) 146/57   Pulse Readings from Last 3 Encounters:  01/21/22 91  01/07/22 72  01/01/22 78    Current Outpatient Medications  Medication Sig Dispense Refill   albuterol (VENTOLIN HFA) 108 (90 Base) MCG/ACT inhaler Inhale 1-2 puffs into the lungs every 6 (six) hours as needed for wheezing or shortness of breath.     amLODipine (NORVASC) 10 MG tablet Take 1 tablet (10 mg total) by mouth daily. (Patient taking differently: Take 10 mg by mouth in the morning.) 90 tablet 1   aspirin EC 81 MG tablet Take 81 mg by mouth in the morning. Swallow whole.     Blood Glucose Calibration (TRUE METRIX LEVEL 1) Low SOLN 1 application by In Vitro route daily Max Daily Amount: 1 application     Blood Glucose Monitoring Suppl (TRUE METRIX GO GLUCOSE METER) w/Device KIT by Does not apply route.     Calcium Carbonate Antacid (CALCIUM CARBONATE PO) Take 1,200 mg of elemental calcium by mouth in the morning.     clopidogrel (PLAVIX) 75 MG tablet TAKE 1 TABLET BY MOUTH  DAILY 90 tablet 3   empagliflozin (JARDIANCE) 10 MG TABS tablet Take by mouth.     FLUoxetine (PROZAC) 10 MG capsule TAKE   1 CAPSULE BY MOUTH EVERY DAY 90 capsule 1   Multiple Vitamin (MULTIVITAMIN WITH MINERALS) TABS tablet Take 1 tablet by mouth in the morning. Centrum     nortriptyline (PAMELOR) 10 MG capsule TAKE 1 CAPSULE BY MOUTH AT BEDTIME 180 capsule 1   Probiotic Product (DIGESTIVE ADVANTAGE) CAPS Take 1 capsule by mouth daily.     rosuvastatin (CRESTOR) 40 MG tablet Take 1 tablet (40 mg total) by mouth daily. (Patient taking differently: Take 40 mg by mouth in the morning.) 90 tablet 1   spironolactone (ALDACTONE) 25 MG tablet Take 1 tablet (25 mg total) by mouth daily. 90 tablet 3   valACYclovir (VALTREX) 500 MG tablet Take 500 mg by mouth as needed.     vitamin C (ASCORBIC ACID) 500 MG tablet Take 500 mg by mouth  2 (two) times a week.     No current facility-administered medications for this visit.    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   Type 2 diabetes mellitus (HCC)     Blood pressure (!) 154/76, height 5' (1.524 m), weight 160 lb (72.6 kg).  Essential hypertension Patient with essential hypertension, currently only on amlodipine 10 mg daily.  Had long discussion about BP and the need to take multiple medications to achieve goal.  Reviewed risk of further cardiac damage with uncontrolled readings for long periods of time.  Will have her discontinue quinapril (has been widely recalled recently) and have re re-start the spironolactone.  Will repeat metabolic panel in 2 weeks and see her back in the office in 1 month for follow up.     Tommy Medal PharmD CPP [facility] [address] [place], [place] [address] [phone]

## 2022-02-04 ENCOUNTER — Ambulatory Visit (INDEPENDENT_AMBULATORY_CARE_PROVIDER_SITE_OTHER): Payer: Medicare PPO | Admitting: Licensed Clinical Social Worker

## 2022-02-04 DIAGNOSIS — E785 Hyperlipidemia, unspecified: Secondary | ICD-10-CM

## 2022-02-04 DIAGNOSIS — R519 Headache, unspecified: Secondary | ICD-10-CM

## 2022-02-04 DIAGNOSIS — N1831 Chronic kidney disease, stage 3a: Secondary | ICD-10-CM

## 2022-02-04 DIAGNOSIS — F322 Major depressive disorder, single episode, severe without psychotic features: Secondary | ICD-10-CM

## 2022-02-04 DIAGNOSIS — I10 Essential (primary) hypertension: Secondary | ICD-10-CM

## 2022-02-04 DIAGNOSIS — D509 Iron deficiency anemia, unspecified: Secondary | ICD-10-CM

## 2022-02-04 DIAGNOSIS — G4700 Insomnia, unspecified: Secondary | ICD-10-CM

## 2022-02-04 NOTE — Chronic Care Management (AMB) (Signed)
Chronic Care Management    Clinical Social Work Note  02/04/2022 Name: Nicole Horn MRN: [id number] DOB: 02-06-1946  Nicole Horn is a 76 y.o. year old female who is a primary care patient of Nicole Rival, FNP. The CCM team was consulted to assist the patient with chronic disease management and/or care coordination needs related to: [employer] .   Engaged with patient by telephone for follow up visit in response to provider referral for social work chronic care management and care coordination services.   Consent to Services:  The patient was given information about Chronic Care Management services, agreed to services, and gave verbal consent prior to initiation of services.  Please see initial visit note for detailed documentation.   Patient agreed to services and consent obtained.   Assessment: Review of patient past medical history, allergies, medications, and health status, including review of relevant consultants reports was performed today as part of a comprehensive evaluation and provision of chronic care management and care coordination services.     SDOH (Social Determinants of Health) assessments and interventions performed:  SDOH Interventions    Flowsheet Row Most Recent Value  SDOH Interventions   Stress Interventions Provide Counseling  [client has stress related to sleeping difficulty. client has stress related to managing medical needs]  Depression Interventions/Treatment  Medication, Counseling        Advanced Directives Status: See Vynca application for related entries.  CCM Care Plan  No Known Allergies  Outpatient Encounter Medications as of 02/04/2022  Medication Sig   albuterol (VENTOLIN HFA) 108 (90 Base) MCG/ACT inhaler Inhale 1-2 puffs into the lungs every 6 (six) hours as needed for wheezing or shortness of breath.   amLODipine (NORVASC) 10 MG tablet Take 1 tablet (10 mg total) by mouth daily. (Patient taking  differently: Take 10 mg by mouth in the morning.)   aspirin EC 81 MG tablet Take 81 mg by mouth in the morning. Swallow whole.   Blood Glucose Calibration (TRUE METRIX LEVEL 1) Low SOLN 1 application by In Vitro route daily Max Daily Amount: 1 application   Blood Glucose Monitoring Suppl (TRUE METRIX GO GLUCOSE METER) w/Device KIT by Does not apply route.   Calcium Carbonate Antacid (CALCIUM CARBONATE PO) Take 1,200 mg of elemental calcium by mouth in the morning.   clopidogrel (PLAVIX) 75 MG tablet TAKE 1 TABLET BY MOUTH  DAILY   empagliflozin (JARDIANCE) 10 MG TABS tablet Take by mouth.   FLUoxetine (PROZAC) 10 MG capsule TAKE 1 CAPSULE BY MOUTH EVERY DAY   Multiple Vitamin (MULTIVITAMIN WITH MINERALS) TABS tablet Take 1 tablet by mouth in the morning. Centrum   nortriptyline (PAMELOR) 10 MG capsule TAKE 1 CAPSULE BY MOUTH AT BEDTIME   Probiotic Product (DIGESTIVE ADVANTAGE) CAPS Take 1 capsule by mouth daily.   rosuvastatin (CRESTOR) 40 MG tablet Take 1 tablet (40 mg total) by mouth daily. (Patient taking differently: Take 40 mg by mouth in the morning.)   spironolactone (ALDACTONE) 25 MG tablet Take 1 tablet (25 mg total) by mouth daily.   valACYclovir (VALTREX) 500 MG tablet Take 500 mg by mouth as needed.   vitamin C (ASCORBIC ACID) 500 MG tablet Take 500 mg by mouth 2 (two) times a week.   No facility-administered encounter medications on file as of 02/04/2022.    Patient Active Problem List   Diagnosis Date Noted   Hepatic steatosis 01/21/2022   Epistaxis 01/21/2022   Compulsive gambling 11/25/2021   Concussion with no loss  of consciousness 09/22/2021   Persistent headaches 09/22/2021   Frequent headaches 09/22/2021   Prediabetes 09/22/2021   LFTs abnormal 02/06/2021   Herpes 01/02/2021   Palpitations 11/27/2020   Depression, major, single episode, severe ([place]) 10/22/2020   Insomnia 10/22/2020   Caregiver role strain 07/23/2020   Primary osteoarthritis of first  carpometacarpal joint of right hand 07/16/2020   Annual visit for general adult medical examination with abnormal findings 04/16/2020   Postmenopausal 04/16/2020   Screening mammogram, encounter for 04/16/2020   Atypical mole 04/16/2020   Need for immunization against influenza 04/16/2020   Constipation 03/17/2020   PVD (peripheral vascular disease) ([place]) 03/14/2020   CAD (coronary artery disease) 03/14/2020   Iron deficiency anemia 03/14/2020   DOE (dyspnea on exertion) 03/10/2020   H/O adenomatous polyp of colon 01/02/2020   FH: colon cancer 01/02/2020   Heart disease 12/11/2019   Upper airway cough syndrome 11/13/2019   Obesity (BMI 30-39.9) 10/16/2019   Essential hypertension 09/10/2019   Hyperlipemia 09/10/2019    Conditions to be addressed/monitored: monitor client management of depression issues  Care Plan : LCSW Care Plan  Updates made by Katha Cabal, LCSW since 02/04/2022 12:00 AM     Problem: Emotional Distress      Goal: Emotional Health Supported.  Manage depression issues. Manage financial issues   Start Date: 12/25/2021  Expected End Date: 05/13/2022  This Visit's Progress: On track  Recent Progress: On track  Priority: Medium  Note:   CARE PLAN ENTRY  Current Barriers:  Financial challenges Depression issues Care giver stress issues Suicidal/Homicidal Ideation:  No  Clinical Social Work Goal(s):  Over the next 30 days, patient will work with SW in person or via phone to manage depression issues of client Client will communicate with SW in next 30 days to discuss client management of daily tasks    Client will attend scheduled medical appointments in next days Client will communicate as needed in next 30 days with RNCM to discuss nursing needs of client  Interventions:  1:1 collaboration with Vena Rua, FNP regarding development and update of comprehensive plan of care as evidenced by provider attestation and co-signature.  LCSW communicated  with client today about client needs Discussed sleeping issues. She sometimes has difficulty sleeping Discussed family support. Has support from son, Nicole Horn Reviewed appetite of client. Reviewed pain issues of client.   Provided counseling support for client.  Reviewed medication procurement of client.  She said she has her prescribed medications Reviewed Blood Pressure monitoring with client since she has talked with medical provider about her Blood Pressure monitoring and Blood Pressure medications. She said she is checking her Blood Pressure 2X daily. She thinks her Blood Pressure is doing well at present Reviewed mood of client.  She takes Prozac as prescribed.  She feels that her mood is stable. She said that she thought Prozac was helpful to her . Discussed pain issues.  Reviewed ambulation of client. She said she is walking well. She also said she is exercising regularly. She feels that exercises are helping her with ambulation. She did not mention any walking problems Encouraged client to call RNCM as needed for nursing support Reviewed relaxation techniques (likes to listen to music, dances occasionally, likes to read)  Patient Coping Skills: Attends scheduled medical appointments Takes medications as prescribed Has support from her son, Nicole Horn  Patient Deficits:  Depression issues Caregiver Strain (helps provide for care needs of her other) Financial issues  Patient goals:  Attend scheduled medical appointments in next 30 days Take medications as prescribed Practice self care (allow time to rest, relax; read, listen to music)  Follow Up Plan:  LCSW to call client on 03/11/22 at 3:00 PM     Norva Riffle.Jospeh Mangel MSW, [place] [facility] representative [facility] Care Management [phone]

## 2022-02-04 NOTE — Patient Instructions (Addendum)
Visit Information  Patient Goals:  Depressive Symptoms identified. Manage depression issues. Manage Financial issues  Time Frame   Short Term goal Priority:  Medium Progress:  On Track  Start Date:  12/25/21 Completion Date:  05/06/22    Follow Up Date: 03/11/22 at 3:00 PM   Depressive Symptoms Identified. Manage depression issues. Manage Financial issues  Patient Coping Skills: Attends scheduled medical appointments Takes medications as prescribed Has support from her son, Mickey Farber  Patient Deficits:  Depression issues Caregiver Strain (helps provide for care needs of her other) Financial issues  Patient goals:  Attend scheduled medical appointments in next 30 days Take medications as prescribed Practice self care (allow time to rest, relax; read, listen to music)  Follow Up Plan:  LCSW to call client on 03/11/22 at 3:00 PM     Norva Riffle.Mart Colpitts MSW, [place] [facility] representative [facility] Care Management [phone]

## 2022-02-10 NOTE — Progress Notes (Unsigned)
Referring Provider: Renee Rival, FNP Primary Care Physician:  Renee Rival, FNP Primary GI Physician: Dr. Rayne Du chief complaint on file.   HPI:   Nicole Horn is a 76 y.o. female with history of IDA, intermittent rectal bleeding, adenomatous colon polyps.  Colonoscopy 6 on 7/21 with diverticula in the sigmoid and descending colon, otherwise normal exam. EGD completed 8/21 with normal esophagus and stomach, 2 small duodenal erosions. Givens placed at time of EGD and revealed multiple small bowel angiectasia's as likely source of iron deficiency anemia. She was referred to hematology for management of IDA. She has been taking oral iron and is receiving IV iron as needed.   She is presenting today at the request of Renee Rival, FNP for hepatic steatosis.  Reviewed recent renal US completed 01/14/2022 which incidentally noted increased hepatic parenchymal echogenicity suggestive of steatosis.  LFTs on 12/14/2021 within normal limits.  Today:   Hepatis steatosis:   IDA:  Hemoglobin had normalized in October 2021 and remained normal until April 2023 with her hemoglobin declined to 10 with iron saturation and ferritin low.  She received 3 IV iron infusions, and most recent hemoglobin improved to 11.9 on 5/25, ferritin elevated at 359, iron 90, saturation 43%.    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   Type 2 diabetes  mellitus ([place])     Past Surgical History:  Procedure Laterality Date   ABDOMINAL HYSTERECTOMY     BACK SURGERY     cervical fuision   BARTHOLIN GLAND CYST EXCISION Left 03/04/2015   Procedure: EXCISION OF LEFT BARTHOLINS TUMOR;  Surgeon: [name], MD;  Location: [facility];  Service: Gynecology;  Laterality: Left;   CATARACT EXTRACTION W/PHACO Right 03/27/2021   Procedure: CATARACT EXTRACTION PHACO AND INTRAOCULAR LENS PLACEMENT (IOC);  Surgeon: Baruch Goldmann, MD;  Location: [facility];  Service: Ophthalmology;  Laterality: Right;  cde 8.45   CATARACT EXTRACTION W/PHACO Left 11/06/2021   Procedure: CATARACT EXTRACTION PHACO AND INTRAOCULAR LENS PLACEMENT (IOC);  Surgeon: Baruch Goldmann, MD;  Location: [facility];  Service: Ophthalmology;  Laterality: Left;  CDE:13.69   CERVICAL SPINE SURGERY     COLONOSCOPY N/A 02/01/2020   Procedure: COLONOSCOPY;  Surgeon: Daneil Dolin, MD; Scattered medium mouth diverticula in the sigmoid and descending colon, otherwise normal exam.   CYST REMOVAL TRUNK     ESOPHAGOGASTRODUODENOSCOPY (EGD) WITH PROPOFOL N/A 03/27/2020   Procedure: ESOPHAGOGASTRODUODENOSCOPY (EGD) WITH PROPOFOL;  Surgeon: Daneil Dolin, MD;  Location: [facility];  Service: Endoscopy;  Laterality: N/A;  1:00pm   GIVENS CAPSULE STUDY N/A 03/27/2020   Procedure: GIVENS CAPSULE STUDY;  Surgeon: Daneil Dolin, MD;  Location: [facility];  Service: Endoscopy;  Laterality: N/A;   Multiple cyst removal surgeries     Stent of lower extremities      Current Outpatient Medications  Medication  Sig Dispense Refill   albuterol (VENTOLIN HFA) 108 (90 Base) MCG/ACT inhaler Inhale 1-2 puffs into the lungs every 6 (six) hours as needed for wheezing or shortness of breath.     amLODipine (NORVASC) 10 MG tablet Take 1 tablet (10 mg total) by mouth daily. (Patient taking differently: Take 10 mg by mouth in the morning.) 90 tablet 1   aspirin EC 81 MG tablet Take 81 mg by mouth in the morning.  Swallow whole.     Blood Glucose Calibration (TRUE METRIX LEVEL 1) Low SOLN 1 application by In Vitro route daily Max Daily Amount: 1 application     Blood Glucose Monitoring Suppl (TRUE METRIX GO GLUCOSE METER) w/Device KIT by Does not apply route.     Calcium Carbonate Antacid (CALCIUM CARBONATE PO) Take 1,200 mg of elemental calcium by mouth in the morning.     clopidogrel (PLAVIX) 75 MG tablet TAKE 1 TABLET BY MOUTH  DAILY 90 tablet 3   empagliflozin (JARDIANCE) 10 MG TABS tablet Take by mouth.     FLUoxetine (PROZAC) 10 MG capsule TAKE 1 CAPSULE BY MOUTH EVERY DAY 90 capsule 1   Multiple Vitamin (MULTIVITAMIN WITH MINERALS) TABS tablet Take 1 tablet by mouth in the morning. Centrum     nortriptyline (PAMELOR) 10 MG capsule TAKE 1 CAPSULE BY MOUTH AT BEDTIME 180 capsule 1   Probiotic Product (DIGESTIVE ADVANTAGE) CAPS Take 1 capsule by mouth daily.     rosuvastatin (CRESTOR) 40 MG tablet Take 1 tablet (40 mg total) by mouth daily. (Patient taking differently: Take 40 mg by mouth in the morning.) 90 tablet 1   spironolactone (ALDACTONE) 25 MG tablet Take 1 tablet (25 mg total) by mouth daily. 90 tablet 3   valACYclovir (VALTREX) 500 MG tablet Take 500 mg by mouth as needed.     vitamin C (ASCORBIC ACID) 500 MG tablet Take 500 mg by mouth 2 (two) times a week.     No current facility-administered medications for this visit.    Allergies as of 02/11/2022   (No Known Allergies)    Family History  Problem Relation Age of Onset   Colon cancer Mother        In her 73s   Breast cancer Sister    Stomach cancer Maternal Aunt    Healthy Brother    Stroke Paternal Grandmother    Heart attack Paternal Grandmother     Social History   Socioeconomic History   Marital status: Widowed    Spouse name: Not on file   Number of children: 1   Years of education: Not on file   Highest education level: Some college, no degree  Occupational History   Not on file  Tobacco Use   Smoking status:  Former    Packs/day: 2.00    Years: 25.00    Total pack years: 50.00    Types: Cigarettes    Quit date: 03/10/1985    Years since quitting: 36.9   Smokeless tobacco: Never  Vaping Use   Vaping Use: Never used  Substance and Sexual Activity   Alcohol use: Not Currently    Alcohol/week: 0.0 standard drinks of alcohol    Comment: socially   Drug use: No   Sexual activity: Yes    Birth control/protection: Surgical  Other Topics Concern   Not on file  Social History Narrative   Lives in [place] and [place] 6 months here and there owns an apt in [place] alone,  but helps to care for mother who is 6 years (goes between the kids)   Sister is in [place] is in [place]      Son who is 87 years old lives in [place], [place]    Has 6 grand kids and 7 great grands      Enjoys exercise (harder since COVID)-dancing      Diet: eats all foods-enjoys smoothies, avoids fried foods, limited red meat if any   Caffeine: coffee 1 cup in the morning-sometimes will have 1 in evening    Water: 3-4 bottles daily       Wears seat belt   Smoke detectors in home   Does not use phone while driving            Social Determinants of Health   Financial Resource Strain: Medium Risk (12/25/2021)   Overall Financial Resource Strain (CARDIA)    Difficulty of Paying Living Expenses: Somewhat hard  Food Insecurity: No Food Insecurity (10/20/2021)   Hunger Vital Sign    Worried About Running Out of Food in the Last Year: Never true    [place] in the Last Year: Never true  Transportation Needs: No Transportation Needs (10/20/2021)   PRAPARE - Transportation    Lack of Transportation (Medical): No    Lack of Transportation (Non-Medical): No  Physical Activity: Insufficiently Active (10/20/2021)   Exercise Vital Sign    Days of Exercise per Week: 4 days    Minutes of Exercise per Session: 20 min  Stress: Stress Concern Present (02/04/2022)   [place]    Feeling of Stress : To some extent  Social Connections: Unknown (10/20/2021)   Social Connection and Isolation Panel [NHANES]    Frequency of Communication with Friends and Family: Three times a week    Frequency of Social Gatherings with Friends and Family: Three times a week    Attends Religious Services: More than 4 times per year    Active Member of Clubs or Organizations: Yes    Attends Archivist Meetings: 1 to 4 times per year    Marital Status: Not on file    Review of Systems: Gen: Denies fever, chills, cold or flulike symptoms, presyncope, syncope. CV: Denies chest pain, palpitations. Resp: Denies dyspnea, cough.  GI: See HPI Heme: See HPI.  Physical Exam: There were no vitals taken for this visit. General:   Alert and oriented. No distress noted. Pleasant and cooperative.  Head:  Normocephalic and atraumatic. Eyes:  Conjuctiva clear without scleral icterus. Heart:  S1, S2 present without murmurs appreciated. Lungs:  Clear to auscultation bilaterally. No wheezes, rales, or rhonchi. No distress.  Abdomen:  +BS, soft, non-tender and non-distended. No rebound or guarding. No HSM or masses noted. Msk:  Symmetrical without gross deformities. Normal posture. Extremities:  Without edema. Neurologic:  Alert and  oriented x4 Psych:  Normal mood and affect.    Assessment:     Plan:  ***   Aliene Altes, PA-C [facility] 02/11/2022

## 2022-02-11 ENCOUNTER — Encounter: Payer: Self-pay | Admitting: Gastroenterology

## 2022-02-11 ENCOUNTER — Ambulatory Visit (INDEPENDENT_AMBULATORY_CARE_PROVIDER_SITE_OTHER): Payer: Medicare PPO | Admitting: Gastroenterology

## 2022-02-11 VITALS — BP 147/73 | HR 83 | Temp 97.6°F | Ht 60.0 in | Wt 166.2 lb

## 2022-02-11 DIAGNOSIS — K760 Fatty (change of) liver, not elsewhere classified: Secondary | ICD-10-CM

## 2022-02-11 DIAGNOSIS — D509 Iron deficiency anemia, unspecified: Secondary | ICD-10-CM | POA: Diagnosis not present

## 2022-02-11 NOTE — Patient Instructions (Addendum)
Instructions for fatty liver: Recommend 1-2# weight loss per week until ideal body weight through exercise & diet. Low fat/cholesterol diet.   Avoid sweets, sodas, fruit juices, sweetened beverages like tea, etc. Gradually increase exercise from 15 min daily up to 1 hr per day 5 days/week. Limit alcohol use.  You should have routine blood work to check your liver enzymes with your primary care doctor every 6 months.  If you have not had blood work done by the next time that we see you, we will plan to update it at that time.  Continue taking oral iron daily and following closely with hematology for IV iron as needed and close monitoring of your hemoglobin.  We we will plan to see you back in 6 months.  Do not hesitate to call sooner if needed.  It was very nice meeting you today!  Aliene Altes, PA-C [facility]

## 2022-02-14 ENCOUNTER — Encounter (HOSPITAL_BASED_OUTPATIENT_CLINIC_OR_DEPARTMENT_OTHER): Payer: Self-pay | Admitting: Cardiovascular Disease

## 2022-02-16 ENCOUNTER — Encounter: Payer: Self-pay | Admitting: Cardiology

## 2022-02-16 ENCOUNTER — Ambulatory Visit: Payer: Medicare PPO | Admitting: Cardiology

## 2022-02-16 VITALS — BP 138/80 | HR 88 | Ht 60.0 in | Wt 164.6 lb

## 2022-02-16 DIAGNOSIS — I25119 Atherosclerotic heart disease of native coronary artery with unspecified angina pectoris: Secondary | ICD-10-CM

## 2022-02-16 DIAGNOSIS — I739 Peripheral vascular disease, unspecified: Secondary | ICD-10-CM | POA: Diagnosis not present

## 2022-02-16 DIAGNOSIS — R002 Palpitations: Secondary | ICD-10-CM

## 2022-02-16 DIAGNOSIS — I10 Essential (primary) hypertension: Secondary | ICD-10-CM

## 2022-02-16 DIAGNOSIS — E782 Mixed hyperlipidemia: Secondary | ICD-10-CM | POA: Diagnosis not present

## 2022-02-16 MED ORDER — METOPROLOL SUCCINATE ER 25 MG PO TB24: 12.5000 mg | ORAL_TABLET | Freq: Every day | ORAL | 2 refills | Status: DC

## 2022-02-16 NOTE — Progress Notes (Signed)
Cardiology Office Note  Date: 02/16/2022   ID: Poppie, Magnano 23-Jul-1946, MRN [id number]  PCP:  Donell Beers, FNP  Cardiologist:  Nona Dell, MD Electrophysiologist:  None   Chief Complaint  Patient presents with   Cardiac follow-up    History of Present Illness: Nicole Horn is a 77 y.o. female seen recently in the hypertension clinic, note reviewed.  She is here for a routine visit.  Does not describe any definite angina, but has had more brief symptoms, somewhat of a sense of palpitations or sharp discomfort in her chest.  Also under stress caring for her elderly mother.  She has had no dizziness or syncope.  Most recent medication adjustments included addition of Aldactone, discontinuation of quinapril, and continuation of Norvasc.  Blood pressure is reasonable today as noted below.  She is tracking her blood pressure at home as well.  I reviewed her medications.  We discussed trial of low-dose beta-blocker.  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   Type 2 diabetes mellitus (HCC)     Past Surgical History:  Procedure Laterality Date   ABDOMINAL HYSTERECTOMY     BACK SURGERY     cervical fuision   BARTHOLIN GLAND CYST EXCISION Left 03/04/2015   Procedure: EXCISION OF LEFT BARTHOLINS TUMOR;  Surgeon: Tilda Burrow, MD;  Location: [facility];  Service: Gynecology;  Laterality: Left;   CATARACT EXTRACTION  W/PHACO Right 03/27/2021   Procedure: CATARACT EXTRACTION PHACO AND INTRAOCULAR LENS PLACEMENT (IOC);  Surgeon: Fabio Pierce, MD;  Location: [facility];  Service: Ophthalmology;  Laterality: Right;  cde 8.45   CATARACT EXTRACTION W/PHACO Left 11/06/2021   Procedure: CATARACT EXTRACTION PHACO AND INTRAOCULAR LENS PLACEMENT (IOC);  Surgeon: Fabio Pierce, MD;  Location: [facility];  Service: Ophthalmology;  Laterality: Left;  CDE:13.69   CERVICAL SPINE SURGERY     COLONOSCOPY N/A 02/01/2020   Procedure: COLONOSCOPY;  Surgeon: Corbin Ade, MD; Scattered medium mouth diverticula in the sigmoid and descending colon, otherwise normal exam.   CYST REMOVAL TRUNK     ESOPHAGOGASTRODUODENOSCOPY (EGD) WITH PROPOFOL N/A 03/27/2020   Surgeon: Corbin Ade, MD;  normal esophagus and stomach, 2 small duodenal erosions.   GIVENS CAPSULE STUDY N/A 03/27/2020   Surgeon: Corbin Ade, MD;  multiple small bowel angiectasia's   Multiple cyst removal surgeries     Stent of lower extremities      Current Outpatient Medications  Medication Sig Dispense Refill   albuterol (VENTOLIN HFA) 108 (90 Base) MCG/ACT inhaler Inhale 1-2 puffs into the lungs every 6 (six) hours as needed for wheezing or shortness of breath.     amLODipine (NORVASC) 10 MG tablet Take 1 tablet (10 mg total) by mouth daily. (Patient taking differently: Take 10 mg by mouth in the morning.) 90 tablet 1   aspirin EC 81 MG tablet Take 81 mg by  mouth in the morning. Swallow whole.     Blood Glucose Calibration (TRUE METRIX LEVEL 1) Low SOLN 1 application by In Vitro route daily Max Daily Amount: 1 application     Blood Glucose Monitoring Suppl (TRUE METRIX GO GLUCOSE METER) w/Device KIT by Does not apply route.     Calcium Carbonate Antacid (CALCIUM CARBONATE PO) Take 1,200 mg of elemental calcium by mouth in the morning.     clopidogrel (PLAVIX) 75 MG tablet TAKE 1 TABLET BY MOUTH  DAILY 90 tablet 3   empagliflozin (JARDIANCE) 10 MG TABS tablet  Take by mouth.     Ferrous Sulfate (IRON PO) Take by mouth daily.     FLUoxetine (PROZAC) 10 MG capsule TAKE 1 CAPSULE BY MOUTH EVERY DAY 90 capsule 1   metoprolol succinate (TOPROL XL) 25 MG 24 hr tablet Take 0.5 tablets (12.5 mg total) by mouth daily. 45 tablet 2   Multiple Vitamin (MULTIVITAMIN WITH MINERALS) TABS tablet Take 1 tablet by mouth in the morning. Centrum     nortriptyline (PAMELOR) 10 MG capsule TAKE 1 CAPSULE BY MOUTH AT BEDTIME 180 capsule 1   Probiotic Product (DIGESTIVE ADVANTAGE) CAPS Take 1 capsule by mouth daily.     rosuvastatin (CRESTOR) 40 MG tablet Take 1 tablet (40 mg total) by mouth daily. (Patient taking differently: Take 40 mg by mouth in the morning.) 90 tablet 1   spironolactone (ALDACTONE) 25 MG tablet Take 1 tablet (25 mg total) by mouth daily. 90 tablet 3   valACYclovir (VALTREX) 500 MG tablet Take 500 mg by mouth as needed.     vitamin C (ASCORBIC ACID) 500 MG tablet Take 500 mg by mouth 2 (two) times a week.     No current facility-administered medications for this visit.   Allergies:  Patient has no known allergies.   ROS: No orthopnea or PND.  Physical Exam: VS:  BP 138/80   Pulse 88   Ht 5' (1.524 m)   Wt 164 lb 9.6 oz (74.7 kg)   SpO2 95%   BMI 32.15 kg/m , BMI Body mass index is 32.15 kg/m.  Wt Readings from Last 3 Encounters:  02/16/22 164 lb 9.6 oz (74.7 kg)  02/11/22 166 lb 3.2 oz (75.4 kg)  02/01/22 160 lb (72.6 kg)    General: Patient appears comfortable at rest. HEENT: Conjunctiva and lids normal, oropharynx clear. Neck: Supple, no elevated JVP or carotid bruits, no thyromegaly. Lungs: Clear to auscultation, nonlabored breathing at rest. Cardiac: Regular rate and rhythm, no S3, 1/6 systolic murmur, no pericardial rub. Abdomen: Soft, nontender, bowel sounds present. Extremities: No pitting edema.  ECG:  An ECG dated 01/01/2022 was personally reviewed today and demonstrated:  Sinus rhythm with nonspecific T wave changes.  Recent  Labwork: 08/10/2021: ALT 29; AST 25 08/20/2021: BUN 13; Creatinine, Ser 0.94; Potassium 4.1; Sodium 144 01/14/2022: Hemoglobin 11.9; Platelets 310     Component Value Date/Time   CHOL 115 08/10/2021 1218   CHOL 139 05/13/2021 1358   TRIG 99 08/10/2021 1218   HDL 47 (L) 08/10/2021 1218   HDL 66 05/13/2021 1358   CHOLHDL 2.4 08/10/2021 1218   LDLCALC 50 08/10/2021 1218    Other Studies Reviewed Today:  Echocardiogram 03/04/2020:  1. Left ventricular ejection fraction, by estimation, is 60 to 65%. The  left ventricle has normal function. The left ventricle has no regional  wall motion abnormalities. There is mild left ventricular hypertrophy.  Left ventricular diastolic parameters  are consistent with Grade I  diastolic dysfunction (impaired relaxation).   2. Right ventricular systolic function is normal. The right ventricular  size is normal. There is normal pulmonary artery systolic pressure.   3. Right atrial size was mildly dilated.   4. The mitral valve is normal in structure. No evidence of mitral valve  regurgitation. No evidence of mitral stenosis.   5. The aortic valve is tricuspid. Aortic valve regurgitation is not  visualized. No aortic stenosis is present.   6. The inferior vena cava is normal in size with greater than 50%  respiratory variability, suggesting right atrial pressure of 3 mmHg.    Cardiac monitor June 2022: ZIO XT reviewed.  8 days, 11 hours analyzed.  Predominant rhythm is sinus with heart rate ranging from 48 bpm up to 118 bpm and average heart rate 76 bpm.  There were rare PACs including couplets and triplets representing less than 1% total beats.  Rare PVCs were noted representing less than 1% total beats.  There were 2 very brief episodes of SVT, the longest of which was only 5 beats.  No sustained arrhythmias or pauses.  Assessment and Plan:  1.  CAD by history, plan to continue medical therapy in the absence of accelerating angina symptoms.  Currently  on aspirin, Jardiance, Plavix, Norvasc, and Crestor.  2.  Intermittent sense of palpitations.  She did wear a cardiac monitor in June of last year that showed rare atrial and ventricular ectopy as well as a few brief episodes of SVT.  We will start Toprol-XL 12.5 mg daily to see if this helps with symptoms.  3.  Mixed hyperlipidemia, doing well on Crestor.  Last LDL 50.  4.  Essential hypertension, tolerating Norvasc and Aldactone with blood pressure 138/80 today.  No changes were made other than addition of low-dose beta-blocker as discussed above.  5.  PAD without progressive claudication.  Continue antiplatelet regimen and statin.  Medication Adjustments/Labs and Tests Ordered: Current medicines are reviewed at length with the patient today.  Concerns regarding medicines are outlined above.   Tests Ordered: No orders of the defined types were placed in this encounter.   Medication Changes: Meds ordered this encounter  Medications   metoprolol succinate (TOPROL XL) 25 MG 24 hr tablet    Sig: Take 0.5 tablets (12.5 mg total) by mouth daily.    Dispense:  45 tablet    Refill:  2    02/16/2022 NEW    Disposition:  Follow up  6 months.  Signed, Jonelle Sidle, MD, [facility] 02/16/2022 4:21 PM    [facility] at [facility] [address] [place], [place], [place] [address] Phone: [phone]; Fax: [phone]

## 2022-02-18 ENCOUNTER — Other Ambulatory Visit: Payer: Self-pay

## 2022-02-18 DIAGNOSIS — I10 Essential (primary) hypertension: Secondary | ICD-10-CM

## 2022-02-19 ENCOUNTER — Other Ambulatory Visit: Payer: Self-pay

## 2022-02-19 DIAGNOSIS — F322 Major depressive disorder, single episode, severe without psychotic features: Secondary | ICD-10-CM

## 2022-02-19 DIAGNOSIS — I10 Essential (primary) hypertension: Secondary | ICD-10-CM

## 2022-02-19 DIAGNOSIS — E785 Hyperlipidemia, unspecified: Secondary | ICD-10-CM

## 2022-02-19 DIAGNOSIS — D509 Iron deficiency anemia, unspecified: Secondary | ICD-10-CM

## 2022-02-19 DIAGNOSIS — I519 Heart disease, unspecified: Secondary | ICD-10-CM

## 2022-02-19 DIAGNOSIS — N1831 Chronic kidney disease, stage 3a: Secondary | ICD-10-CM

## 2022-02-27 ENCOUNTER — Telehealth: Payer: Self-pay

## 2022-02-27 NOTE — Telephone Encounter (Signed)
Called to pt reference PREP referral and next class at [place] starting late Aug.  Pt would like to participate. Prefers afternoon.  Advised will call back closer to Aug with start date and times, intake appt.  Confirmed she has my number for call back

## 2022-03-11 ENCOUNTER — Ambulatory Visit (INDEPENDENT_AMBULATORY_CARE_PROVIDER_SITE_OTHER): Payer: Medicare PPO | Admitting: Licensed Clinical Social Worker

## 2022-03-11 DIAGNOSIS — I10 Essential (primary) hypertension: Secondary | ICD-10-CM

## 2022-03-11 DIAGNOSIS — E669 Obesity, unspecified: Secondary | ICD-10-CM

## 2022-03-11 DIAGNOSIS — G4700 Insomnia, unspecified: Secondary | ICD-10-CM

## 2022-03-11 DIAGNOSIS — N1831 Chronic kidney disease, stage 3a: Secondary | ICD-10-CM

## 2022-03-11 DIAGNOSIS — F322 Major depressive disorder, single episode, severe without psychotic features: Secondary | ICD-10-CM

## 2022-03-11 DIAGNOSIS — E785 Hyperlipidemia, unspecified: Secondary | ICD-10-CM

## 2022-03-11 DIAGNOSIS — R519 Headache, unspecified: Secondary | ICD-10-CM

## 2022-03-11 DIAGNOSIS — D509 Iron deficiency anemia, unspecified: Secondary | ICD-10-CM

## 2022-03-11 NOTE — Patient Instructions (Addendum)
Visit Information  Patient Goals:   Depressive Symptoms Identified; Manage depression issues. Manage Financial issues  Time Frame   Short Term goal Priority:  Medium Progress:  On Track  Start Date:  12/25/21 Completion Date:  05/18/22      Follow Up Date: 04/23/22 at 2:00 PM   Depressive Symptoms Identified. Manage depression issues. Manage Financial issues  Patient Coping Skills: Attends scheduled medical appointments Takes medications as prescribed Has support from her son, Mickey Farber  Patient Deficits:  Depression issues Caregiver Strain (helps provide for care needs of her other) Financial issues  Patient goals:  Attend scheduled medical appointments in next 30 days Take medications as prescribed Practice self care (allow time to rest, relax; read, listen to music)  Follow Up Plan:  LCSW to call client on 04/23/22 at 2:00 PM   [place].Tate Zagal MSW, [place] [employer] representative [facility] Care Management [phone]

## 2022-03-11 NOTE — Chronic Care Management (AMB) (Signed)
Chronic Care Management    Clinical Social Work Note  03/11/2022 Name: Nicole Horn MRN: [id number] DOB: 1946-08-21  Nicole Horn is a 76 y.o. year old female who is a primary care patient of Renee Rival, FNP. The CCM team was consulted to assist the patient with chronic disease management and/or care coordination needs related to: [employer] .   Engaged with patient by telephone for follow up visit in response to provider referral for social work chronic care management and care coordination services.   Consent to Services:  The patient was given information about Chronic Care Management services, agreed to services, and gave verbal consent prior to initiation of services.  Please see initial visit note for detailed documentation.   Patient agreed to services and consent obtained.   Assessment: Review of patient past medical history, allergies, medications, and health status, including review of relevant consultants reports was performed today as part of a comprehensive evaluation and provision of chronic care management and care coordination services.     SDOH (Social Determinants of Health) assessments and interventions performed:  SDOH Interventions    Flowsheet Row Most Recent Value  SDOH Interventions   Stress Interventions Provide Counseling  [client has stress related to financial issues. she has stress related to managing medical needs]  Depression Interventions/Treatment  Medication, Counseling        Advanced Directives Status: See Vynca application for related entries.  CCM Care Plan  No Known Allergies  Outpatient Encounter Medications as of 03/11/2022  Medication Sig   albuterol (VENTOLIN HFA) 108 (90 Base) MCG/ACT inhaler Inhale 1-2 puffs into the lungs every 6 (six) hours as needed for wheezing or shortness of breath.   amLODipine (NORVASC) 10 MG tablet Take 1 tablet (10 mg total) by mouth daily. (Patient taking differently:  Take 10 mg by mouth in the morning.)   aspirin EC 81 MG tablet Take 81 mg by mouth in the morning. Swallow whole.   Blood Glucose Calibration (TRUE METRIX LEVEL 1) Low SOLN 1 application by In Vitro route daily Max Daily Amount: 1 application   Blood Glucose Monitoring Suppl (TRUE METRIX GO GLUCOSE METER) w/Device KIT by Does not apply route.   Calcium Carbonate Antacid (CALCIUM CARBONATE PO) Take 1,200 mg of elemental calcium by mouth in the morning.   clopidogrel (PLAVIX) 75 MG tablet TAKE 1 TABLET BY MOUTH  DAILY   empagliflozin (JARDIANCE) 10 MG TABS tablet Take by mouth.   Ferrous Sulfate (IRON PO) Take by mouth daily.   FLUoxetine (PROZAC) 10 MG capsule TAKE 1 CAPSULE BY MOUTH EVERY DAY   metoprolol succinate (TOPROL XL) 25 MG 24 hr tablet Take 0.5 tablets (12.5 mg total) by mouth daily.   Multiple Vitamin (MULTIVITAMIN WITH MINERALS) TABS tablet Take 1 tablet by mouth in the morning. Centrum   nortriptyline (PAMELOR) 10 MG capsule TAKE 1 CAPSULE BY MOUTH AT BEDTIME   Probiotic Product (DIGESTIVE ADVANTAGE) CAPS Take 1 capsule by mouth daily.   rosuvastatin (CRESTOR) 40 MG tablet Take 1 tablet (40 mg total) by mouth daily. (Patient taking differently: Take 40 mg by mouth in the morning.)   spironolactone (ALDACTONE) 25 MG tablet Take 1 tablet (25 mg total) by mouth daily.   valACYclovir (VALTREX) 500 MG tablet Take 500 mg by mouth as needed.   vitamin C (ASCORBIC ACID) 500 MG tablet Take 500 mg by mouth 2 (two) times a week.   No facility-administered encounter medications on file as of 03/11/2022.  Patient Active Problem List   Diagnosis Date Noted   Hepatic steatosis 01/21/2022   Epistaxis 01/21/2022   Compulsive gambling 11/25/2021   Concussion with no loss of consciousness 09/22/2021   Persistent headaches 09/22/2021   Frequent headaches 09/22/2021   Prediabetes 09/22/2021   LFTs abnormal 02/06/2021   Herpes 01/02/2021   Palpitations 11/27/2020   Depression, major, single  episode, severe ([place]) 10/22/2020   Insomnia 10/22/2020   Caregiver role strain 07/23/2020   Primary osteoarthritis of first carpometacarpal joint of right hand 07/16/2020   Annual visit for general adult medical examination with abnormal findings 04/16/2020   Postmenopausal 04/16/2020   Screening mammogram, encounter for 04/16/2020   Atypical mole 04/16/2020   Need for immunization against influenza 04/16/2020   Constipation 03/17/2020   PVD (peripheral vascular disease) ([place]) 03/14/2020   CAD (coronary artery disease) 03/14/2020   Iron deficiency anemia 03/14/2020   DOE (dyspnea on exertion) 03/10/2020   H/O adenomatous polyp of colon 01/02/2020   FH: colon cancer 01/02/2020   Heart disease 12/11/2019   Upper airway cough syndrome 11/13/2019   Obesity (BMI 30-39.9) 10/16/2019   Essential hypertension 09/10/2019   Hyperlipemia 09/10/2019    Conditions to be addressed/monitored: monitor client management of depression issues and of anxiety issues  Care Plan : LCSW Care Plan  Updates made by Katha Cabal, LCSW since 03/11/2022 12:00 AM     Problem: Emotional Distress      Goal: Emotional Health Supported.  Manage depression issues. Manage financial issues   Start Date: 12/25/2021  Expected End Date: 05/19/2022  This Visit's Progress: On track  Recent Progress: On track  Priority: Medium  Note:   CARE PLAN ENTRY  Current Barriers:  Financial challenges Depression issues Care giver stress issues Suicidal/Homicidal Ideation:  No  Clinical Social Work Goal(s):  Over the next 30 days, patient will work with SW in person or via phone to manage depression issues of client Client will communicate with SW in next 30 days to discuss client management of daily tasks    Client will attend scheduled medical appointments in next days Client will communicate as needed in next 30 days with RNCM to discuss nursing needs of client  Interventions:  1:1 collaboration with Vena Rua, FNP regarding development and update of comprehensive plan of care as evidenced by provider attestation and co-signature.  LCSW communicated with client today about client needs Discussed sleeping issues. She sometimes has difficulty sleeping Discussed family support. Has support from son, Mickey Farber Reviewed appetite of client. Reviewed pain issues of client.  Client spoke of pain in her right thumb. She said she wears a hand brace; she spoke of pain in her right thumb Provided counseling support for client.  Reviewed medication procurement of client.  She said she has her prescribed medications Reviewed Blood Pressure monitoring with client since she has talked with medical provider about her Blood Pressure monitoring and Blood Pressure medications. She said she is checking her Blood Pressure 2X daily Reviewed mood of client.  She takes Prozac as prescribed.  She feels that her mood is stable. She said that she thought Prozac was helpful to her . Reviewed ambulation of client. She said she is walking well. She also said she is exercising regularly. She feels that exercises are helping her with ambulation. She did not mention any walking problems Encouraged client to call RNCM as needed for nursing support Reviewed relaxation techniques (likes to listen to music, dances occasionally, likes to read)  Patient Coping Skills: Attends scheduled medical appointments Takes medications as prescribed Has support from her son, Mickey Farber  Patient Deficits:  Depression issues Caregiver Strain (helps provide for care needs of her other) Financial issues  Patient goals:  Attend scheduled medical appointments in next 30 days Take medications as prescribed Practice self care (allow time to rest, relax; read, listen to music)  Follow Up Plan:  LCSW to call client on 04/23/22 at 2:00 PM     Norva Riffle.Caroly Purewal MSW, [place] Holiday representative [facility] Care Management [phone]

## 2022-03-15 ENCOUNTER — Ambulatory Visit: Payer: Medicare PPO

## 2022-03-15 NOTE — Progress Notes (Deleted)
03/15/2022 Nicole Horn May 24, 1946 [id number]   HPI:  Nicole Horn is a 76 y.o. female patient of Dr Oval Linsey, with a PMH below who presents today for advanced hypertension clinic follow up.   She was seen by Dr. Oval Linsey last month and noted to have a BP of 139/70.  Patient reported having history of hypertension, going back to when she was in her 76's.  When she saw Dr. Oval Linsey there was some confusion about whether she was taking her medications.  She was asked to stop hydralazine and restart the spironolactone 25 mg daily.  She was agreeable to join our [place] and was randomized to group 1.    She returns today for follow up.  She has not re-started the spironolactone, nor is she taking quinapril.  It appears that she is only on amlodipine 10 mg for her blood pressure.  She doesn't know why she isn't taking the other two medications.    Since I saw her last, she was seen by Dr. Domenic Polite in our [place] office.  BP that day was 1138/80.  He started her on metoprolol suc 12.5 mg daily to help with her intermittent sense of palpitations.    Past Medical History: CAD MI 2004, no PCI  hyperlipidemia 4/23 LDL 51 on rosuvastatin 40  PAD Bilateral lower extremity stents  DM2 4/23 A1c 6.3 on empagliflozin  OSA     Blood Pressure Goal:  130/80  Current Medications: amlodipine 10 mg qd, spironolactone 25 mg qd, metoprolol succ 12.5 mg qd  Family Hx: mother has hypertension, now 50;  both maternal grandparents, sister (even though she's a "health freak") ; father history unknown; son with hypertension,   Social Hx: no tobacco (quit at age 44), occasional cocktail, coffee 1 cup/day,   Diet: mox of home and out;  smoothies in the mornign, balanced inner in evenings, has been eating out more, tries to make low sodium healthy choices  Exercise: low impact exercise and stationary bike 3 days/week; tries to do something most days  Home BP readings: 34 home  readings, mix of morning and evening  Average 160/84 (range 142-197/67-103)  HR 82  Intolerances: nkda  Labs: 4/23: Na 144, K 4.1, Glu 114, BUN 19, SCr 1.15 GFR 50   Wt Readings from Last 3 Encounters:  02/16/22 164 lb 9.6 oz (74.7 kg)  02/11/22 166 lb 3.2 oz (75.4 kg)  02/01/22 160 lb (72.6 kg)   BP Readings from Last 3 Encounters:  02/16/22 138/80  02/11/22 (!) 147/73  02/01/22 (!) 154/76   Pulse Readings from Last 3 Encounters:  02/16/22 88  02/11/22 83  01/21/22 91    Current Outpatient Medications  Medication Sig Dispense Refill   albuterol (VENTOLIN HFA) 108 (90 Base) MCG/ACT inhaler Inhale 1-2 puffs into the lungs every 6 (six) hours as needed for wheezing or shortness of breath.     amLODipine (NORVASC) 10 MG tablet Take 1 tablet (10 mg total) by mouth daily. (Patient taking differently: Take 10 mg by mouth in the morning.) 90 tablet 1   aspirin EC 81 MG tablet Take 81 mg by mouth in the morning. Swallow whole.     Blood Glucose Calibration (TRUE METRIX LEVEL 1) Low SOLN 1 application by In Vitro route daily Max Daily Amount: 1 application     Blood Glucose Monitoring Suppl (TRUE METRIX GO GLUCOSE METER) w/Device KIT by Does not apply route.     Calcium Carbonate Antacid (CALCIUM  CARBONATE PO) Take 1,200 mg of elemental calcium by mouth in the morning.     clopidogrel (PLAVIX) 75 MG tablet TAKE 1 TABLET BY MOUTH  DAILY 90 tablet 3   empagliflozin (JARDIANCE) 10 MG TABS tablet Take by mouth.     Ferrous Sulfate (IRON PO) Take by mouth daily.     FLUoxetine (PROZAC) 10 MG capsule TAKE 1 CAPSULE BY MOUTH EVERY DAY 90 capsule 1   metoprolol succinate (TOPROL XL) 25 MG 24 hr tablet Take 0.5 tablets (12.5 mg total) by mouth daily. 45 tablet 2   Multiple Vitamin (MULTIVITAMIN WITH MINERALS) TABS tablet Take 1 tablet by mouth in the morning. Centrum     nortriptyline (PAMELOR) 10 MG capsule TAKE 1 CAPSULE BY MOUTH AT BEDTIME 180 capsule 1   Probiotic Product (DIGESTIVE  ADVANTAGE) CAPS Take 1 capsule by mouth daily.     rosuvastatin (CRESTOR) 40 MG tablet Take 1 tablet (40 mg total) by mouth daily. (Patient taking differently: Take 40 mg by mouth in the morning.) 90 tablet 1   spironolactone (ALDACTONE) 25 MG tablet Take 1 tablet (25 mg total) by mouth daily. 90 tablet 3   valACYclovir (VALTREX) 500 MG tablet Take 500 mg by mouth as needed.     vitamin C (ASCORBIC ACID) 500 MG tablet Take 500 mg by mouth 2 (two) times a week.     No current facility-administered medications for this visit.    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   Type 2 diabetes mellitus (HCC)     There were no vitals taken for this visit.  No problem-specific Assessment & Plan notes found for this encounter.    Tommy Medal PharmD CPP [facility] [address] [place], [place] [address] [phone]

## 2022-03-18 ENCOUNTER — Ambulatory Visit: Payer: Medicare PPO

## 2022-03-19 ENCOUNTER — Ambulatory Visit: Payer: Medicare PPO

## 2022-03-19 NOTE — Progress Notes (Deleted)
03/19/2022 Nicole Horn 12/10/1945 [id number]   HPI:  Nicole Horn is a 76 y.o. female patient of Dr Oval Linsey, with a PMH below who presents today for advanced hypertension clinic follow up.   She was seen by Dr. Oval Linsey last month and noted to have a BP of 139/70.  Patient reported having history of hypertension, going back to when she was in her 76's.  When she saw Dr. Oval Linsey there was some confusion about whether she was taking her medications.  She was asked to stop hydralazine and restart the spironolactone 25 mg daily.  She was agreeable to join our Tiburones and was randomized to group 1.    She returns today for follow up.  She has not re-started the spironolactone, nor is she taking quinapril.  It appears that she is only on amlodipine 10 mg for her blood pressure.  She doesn't know why she isn't taking the other two medications.    Since I saw her last, she was seen by Dr. Domenic Polite in our [place] office.  BP that day was 1138/80.  He started her on metoprolol suc 12.5 mg daily to help with her intermittent sense of palpitations.    Past Medical History: CAD MI 2004, no PCI  hyperlipidemia 4/23 LDL 51 on rosuvastatin 40  PAD Bilateral lower extremity stents  DM2 4/23 A1c 6.3 on empagliflozin  OSA     Blood Pressure Goal:  130/80  Current Medications: amlodipine 10 mg qd, spironolactone 25 mg qd, metoprolol succ 12.5 mg qd  Family Hx: mother has hypertension, now 36;  both maternal grandparents, sister (even though she's a "health freak") ; father history unknown; son with hypertension,   Social Hx: no tobacco (quit at age 89), occasional cocktail, coffee 1 cup/day,   Diet: mox of home and out;  smoothies in the mornign, balanced inner in evenings, has been eating out more, tries to make low sodium healthy choices  Exercise: low impact exercise and stationary bike 3 days/week; tries to do something most days  Home BP readings: 34 home  readings, mix of morning and evening  Average 160/84 (range 142-197/67-103)  HR 82  Intolerances: nkda  Labs: 4/23: Na 144, K 4.1, Glu 114, BUN 19, SCr 1.15 GFR 50   Wt Readings from Last 3 Encounters:  02/16/22 164 lb 9.6 oz (74.7 kg)  02/11/22 166 lb 3.2 oz (75.4 kg)  02/01/22 160 lb (72.6 kg)   BP Readings from Last 3 Encounters:  02/16/22 138/80  02/11/22 (!) 147/73  02/01/22 (!) 154/76   Pulse Readings from Last 3 Encounters:  02/16/22 88  02/11/22 83  01/21/22 91    Current Outpatient Medications  Medication Sig Dispense Refill   albuterol (VENTOLIN HFA) 108 (90 Base) MCG/ACT inhaler Inhale 1-2 puffs into the lungs every 6 (six) hours as needed for wheezing or shortness of breath.     amLODipine (NORVASC) 10 MG tablet Take 1 tablet (10 mg total) by mouth daily. (Patient taking differently: Take 10 mg by mouth in the morning.) 90 tablet 1   aspirin EC 81 MG tablet Take 81 mg by mouth in the morning. Swallow whole.     Blood Glucose Calibration (TRUE METRIX LEVEL 1) Low SOLN 1 application by In Vitro route daily Max Daily Amount: 1 application     Blood Glucose Monitoring Suppl (TRUE METRIX GO GLUCOSE METER) w/Device KIT by Does not apply route.     Calcium Carbonate Antacid (CALCIUM  CARBONATE PO) Take 1,200 mg of elemental calcium by mouth in the morning.     clopidogrel (PLAVIX) 75 MG tablet TAKE 1 TABLET BY MOUTH  DAILY 90 tablet 3   empagliflozin (JARDIANCE) 10 MG TABS tablet Take by mouth.     Ferrous Sulfate (IRON PO) Take by mouth daily.     FLUoxetine (PROZAC) 10 MG capsule TAKE 1 CAPSULE BY MOUTH EVERY DAY 90 capsule 1   metoprolol succinate (TOPROL XL) 25 MG 24 hr tablet Take 0.5 tablets (12.5 mg total) by mouth daily. 45 tablet 2   Multiple Vitamin (MULTIVITAMIN WITH MINERALS) TABS tablet Take 1 tablet by mouth in the morning. Centrum     nortriptyline (PAMELOR) 10 MG capsule TAKE 1 CAPSULE BY MOUTH AT BEDTIME 180 capsule 1   Probiotic Product (DIGESTIVE  ADVANTAGE) CAPS Take 1 capsule by mouth daily.     rosuvastatin (CRESTOR) 40 MG tablet Take 1 tablet (40 mg total) by mouth daily. (Patient taking differently: Take 40 mg by mouth in the morning.) 90 tablet 1   spironolactone (ALDACTONE) 25 MG tablet Take 1 tablet (25 mg total) by mouth daily. 90 tablet 3   valACYclovir (VALTREX) 500 MG tablet Take 500 mg by mouth as needed.     vitamin C (ASCORBIC ACID) 500 MG tablet Take 500 mg by mouth 2 (two) times a week.     No current facility-administered medications for this visit.    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   Type 2 diabetes mellitus (HCC)     There were no vitals taken for this visit.  No problem-specific Assessment & Plan notes found for this encounter.    Tommy Medal PharmD CPP [facility] [address] [place], [place] [address] [phone]

## 2022-03-22 DIAGNOSIS — F322 Major depressive disorder, single episode, severe without psychotic features: Secondary | ICD-10-CM

## 2022-03-22 DIAGNOSIS — N1831 Chronic kidney disease, stage 3a: Secondary | ICD-10-CM

## 2022-03-22 DIAGNOSIS — I10 Essential (primary) hypertension: Secondary | ICD-10-CM

## 2022-03-22 DIAGNOSIS — E785 Hyperlipidemia, unspecified: Secondary | ICD-10-CM | POA: Diagnosis not present

## 2022-03-22 DIAGNOSIS — D509 Iron deficiency anemia, unspecified: Secondary | ICD-10-CM

## 2022-03-29 ENCOUNTER — Telehealth: Payer: Self-pay | Admitting: *Deleted

## 2022-03-29 ENCOUNTER — Encounter: Payer: Self-pay | Admitting: *Deleted

## 2022-03-29 NOTE — Telephone Encounter (Signed)
Called to offer August PREP class at [facility]. Accepted Tues/Thurs. [id number] beginning 04/13/2022. Intake assessment scheduled 04/06/2022 at 1000.

## 2022-04-05 ENCOUNTER — Other Ambulatory Visit: Payer: Self-pay

## 2022-04-05 DIAGNOSIS — D509 Iron deficiency anemia, unspecified: Secondary | ICD-10-CM

## 2022-04-06 ENCOUNTER — Inpatient Hospital Stay: Payer: Medicare PPO | Attending: Physician Assistant

## 2022-04-06 ENCOUNTER — Encounter: Payer: Self-pay | Admitting: *Deleted

## 2022-04-06 DIAGNOSIS — Z5986 Financial insecurity: Secondary | ICD-10-CM | POA: Insufficient documentation

## 2022-04-06 DIAGNOSIS — Z823 Family history of stroke: Secondary | ICD-10-CM | POA: Insufficient documentation

## 2022-04-06 DIAGNOSIS — G2581 Restless legs syndrome: Secondary | ICD-10-CM | POA: Insufficient documentation

## 2022-04-06 DIAGNOSIS — Z8616 Personal history of COVID-19: Secondary | ICD-10-CM | POA: Insufficient documentation

## 2022-04-06 DIAGNOSIS — Z800 Family history of malignant neoplasm of digestive organs: Secondary | ICD-10-CM | POA: Insufficient documentation

## 2022-04-06 DIAGNOSIS — R079 Chest pain, unspecified: Secondary | ICD-10-CM | POA: Insufficient documentation

## 2022-04-06 DIAGNOSIS — I129 Hypertensive chronic kidney disease with stage 1 through stage 4 chronic kidney disease, or unspecified chronic kidney disease: Secondary | ICD-10-CM | POA: Diagnosis not present

## 2022-04-06 DIAGNOSIS — Z803 Family history of malignant neoplasm of breast: Secondary | ICD-10-CM | POA: Insufficient documentation

## 2022-04-06 DIAGNOSIS — Z79899 Other long term (current) drug therapy: Secondary | ICD-10-CM | POA: Insufficient documentation

## 2022-04-06 DIAGNOSIS — R110 Nausea: Secondary | ICD-10-CM | POA: Insufficient documentation

## 2022-04-06 DIAGNOSIS — Z87891 Personal history of nicotine dependence: Secondary | ICD-10-CM | POA: Insufficient documentation

## 2022-04-06 DIAGNOSIS — R5383 Other fatigue: Secondary | ICD-10-CM | POA: Insufficient documentation

## 2022-04-06 DIAGNOSIS — I10 Essential (primary) hypertension: Secondary | ICD-10-CM | POA: Insufficient documentation

## 2022-04-06 DIAGNOSIS — R809 Proteinuria, unspecified: Secondary | ICD-10-CM | POA: Diagnosis not present

## 2022-04-06 DIAGNOSIS — D509 Iron deficiency anemia, unspecified: Secondary | ICD-10-CM | POA: Insufficient documentation

## 2022-04-06 DIAGNOSIS — I252 Old myocardial infarction: Secondary | ICD-10-CM | POA: Insufficient documentation

## 2022-04-06 DIAGNOSIS — Z7902 Long term (current) use of antithrombotics/antiplatelets: Secondary | ICD-10-CM | POA: Insufficient documentation

## 2022-04-06 DIAGNOSIS — E1129 Type 2 diabetes mellitus with other diabetic kidney complication: Secondary | ICD-10-CM | POA: Diagnosis not present

## 2022-04-06 DIAGNOSIS — R42 Dizziness and giddiness: Secondary | ICD-10-CM | POA: Insufficient documentation

## 2022-04-06 DIAGNOSIS — I5032 Chronic diastolic (congestive) heart failure: Secondary | ICD-10-CM | POA: Diagnosis not present

## 2022-04-06 DIAGNOSIS — E782 Mixed hyperlipidemia: Secondary | ICD-10-CM | POA: Insufficient documentation

## 2022-04-06 DIAGNOSIS — N189 Chronic kidney disease, unspecified: Secondary | ICD-10-CM | POA: Diagnosis not present

## 2022-04-06 DIAGNOSIS — E1122 Type 2 diabetes mellitus with diabetic chronic kidney disease: Secondary | ICD-10-CM | POA: Diagnosis not present

## 2022-04-06 NOTE — Progress Notes (Signed)
Arrived for PREP Class Assessment visit today. Stated she would have to miss 3 weeks of class due to previously scheduled vacation. She agreed it would be better for her to wait until the next class offered later in the fall.

## 2022-04-07 DIAGNOSIS — E1129 Type 2 diabetes mellitus with other diabetic kidney complication: Secondary | ICD-10-CM | POA: Diagnosis not present

## 2022-04-07 DIAGNOSIS — Z6833 Body mass index (BMI) 33.0-33.9, adult: Secondary | ICD-10-CM | POA: Diagnosis not present

## 2022-04-07 DIAGNOSIS — E1122 Type 2 diabetes mellitus with diabetic chronic kidney disease: Secondary | ICD-10-CM | POA: Diagnosis not present

## 2022-04-07 DIAGNOSIS — R809 Proteinuria, unspecified: Secondary | ICD-10-CM | POA: Diagnosis not present

## 2022-04-07 DIAGNOSIS — I129 Hypertensive chronic kidney disease with stage 1 through stage 4 chronic kidney disease, or unspecified chronic kidney disease: Secondary | ICD-10-CM | POA: Diagnosis not present

## 2022-04-07 DIAGNOSIS — N189 Chronic kidney disease, unspecified: Secondary | ICD-10-CM | POA: Diagnosis not present

## 2022-04-07 DIAGNOSIS — I5032 Chronic diastolic (congestive) heart failure: Secondary | ICD-10-CM | POA: Diagnosis not present

## 2022-04-07 DIAGNOSIS — K760 Fatty (change of) liver, not elsewhere classified: Secondary | ICD-10-CM | POA: Diagnosis not present

## 2022-04-12 ENCOUNTER — Inpatient Hospital Stay: Payer: Medicare PPO

## 2022-04-12 DIAGNOSIS — Z8616 Personal history of COVID-19: Secondary | ICD-10-CM | POA: Diagnosis not present

## 2022-04-12 DIAGNOSIS — Z5986 Financial insecurity: Secondary | ICD-10-CM | POA: Diagnosis not present

## 2022-04-12 DIAGNOSIS — Z800 Family history of malignant neoplasm of digestive organs: Secondary | ICD-10-CM | POA: Diagnosis not present

## 2022-04-12 DIAGNOSIS — R110 Nausea: Secondary | ICD-10-CM | POA: Diagnosis not present

## 2022-04-12 DIAGNOSIS — D509 Iron deficiency anemia, unspecified: Secondary | ICD-10-CM

## 2022-04-12 DIAGNOSIS — Z803 Family history of malignant neoplasm of breast: Secondary | ICD-10-CM | POA: Diagnosis not present

## 2022-04-12 DIAGNOSIS — Z823 Family history of stroke: Secondary | ICD-10-CM | POA: Diagnosis not present

## 2022-04-12 DIAGNOSIS — I252 Old myocardial infarction: Secondary | ICD-10-CM | POA: Diagnosis not present

## 2022-04-12 DIAGNOSIS — E782 Mixed hyperlipidemia: Secondary | ICD-10-CM | POA: Diagnosis not present

## 2022-04-12 DIAGNOSIS — G2581 Restless legs syndrome: Secondary | ICD-10-CM | POA: Diagnosis not present

## 2022-04-12 DIAGNOSIS — Z79899 Other long term (current) drug therapy: Secondary | ICD-10-CM | POA: Diagnosis not present

## 2022-04-12 DIAGNOSIS — R079 Chest pain, unspecified: Secondary | ICD-10-CM | POA: Diagnosis not present

## 2022-04-12 DIAGNOSIS — R5383 Other fatigue: Secondary | ICD-10-CM | POA: Diagnosis not present

## 2022-04-12 DIAGNOSIS — R42 Dizziness and giddiness: Secondary | ICD-10-CM | POA: Diagnosis not present

## 2022-04-12 DIAGNOSIS — Z7902 Long term (current) use of antithrombotics/antiplatelets: Secondary | ICD-10-CM | POA: Diagnosis not present

## 2022-04-12 DIAGNOSIS — I10 Essential (primary) hypertension: Secondary | ICD-10-CM | POA: Diagnosis not present

## 2022-04-12 DIAGNOSIS — Z87891 Personal history of nicotine dependence: Secondary | ICD-10-CM | POA: Diagnosis not present

## 2022-04-12 LAB — CBC WITH DIFFERENTIAL/PLATELET
Abs Immature Granulocytes: 0.01 10*3/uL (ref 0.00–0.07)
Basophils Absolute: 0 10*3/uL (ref 0.0–0.1)
Basophils Relative: 1 %
Eosinophils Absolute: 0.1 10*3/uL (ref 0.0–0.5)
Eosinophils Relative: 3 %
HCT: 38.9 % (ref 36.0–46.0)
Hemoglobin: 11.9 g/dL — ABNORMAL LOW (ref 12.0–15.0)
Immature Granulocytes: 0 %
Lymphocytes Relative: 43 %
Lymphs Abs: 1.8 10*3/uL (ref 0.7–4.0)
MCH: 25.7 pg — ABNORMAL LOW (ref 26.0–34.0)
MCHC: 30.6 g/dL (ref 30.0–36.0)
MCV: 84 fL (ref 80.0–100.0)
Monocytes Absolute: 0.4 10*3/uL (ref 0.1–1.0)
Monocytes Relative: 9 %
Neutro Abs: 1.8 10*3/uL (ref 1.7–7.7)
Neutrophils Relative %: 44 %
Platelets: 307 10*3/uL (ref 150–400)
RBC: 4.63 MIL/uL (ref 3.87–5.11)
RDW: 15.9 % — ABNORMAL HIGH (ref 11.5–15.5)
WBC: 4.1 10*3/uL (ref 4.0–10.5)
nRBC: 0 % (ref 0.0–0.2)

## 2022-04-12 LAB — IRON AND TIBC
Iron: 88 ug/dL (ref 28–170)
Saturation Ratios: 21 % (ref 10.4–31.8)
TIBC: 426 ug/dL (ref 250–450)
UIBC: 338 ug/dL

## 2022-04-12 LAB — FERRITIN: Ferritin: 44 ng/mL (ref 11–307)

## 2022-04-12 NOTE — Progress Notes (Deleted)
RESCHEDULED (Late to appointment x 18 minutes)

## 2022-04-13 ENCOUNTER — Inpatient Hospital Stay: Payer: Medicare PPO | Admitting: Physician Assistant
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
## 2022-04-20 ENCOUNTER — Inpatient Hospital Stay (HOSPITAL_BASED_OUTPATIENT_CLINIC_OR_DEPARTMENT_OTHER): Payer: Medicare PPO | Admitting: Physician Assistant

## 2022-04-20 VITALS — BP 145/65 | HR 68 | Temp 97.5°F | Resp 18 | Ht 60.0 in | Wt 163.0 lb

## 2022-04-20 DIAGNOSIS — R5383 Other fatigue: Secondary | ICD-10-CM | POA: Diagnosis not present

## 2022-04-20 DIAGNOSIS — G2581 Restless legs syndrome: Secondary | ICD-10-CM | POA: Diagnosis not present

## 2022-04-20 DIAGNOSIS — R079 Chest pain, unspecified: Secondary | ICD-10-CM | POA: Diagnosis not present

## 2022-04-20 DIAGNOSIS — R42 Dizziness and giddiness: Secondary | ICD-10-CM | POA: Diagnosis not present

## 2022-04-20 DIAGNOSIS — D509 Iron deficiency anemia, unspecified: Secondary | ICD-10-CM

## 2022-04-20 DIAGNOSIS — I252 Old myocardial infarction: Secondary | ICD-10-CM | POA: Diagnosis not present

## 2022-04-20 DIAGNOSIS — E782 Mixed hyperlipidemia: Secondary | ICD-10-CM | POA: Diagnosis not present

## 2022-04-20 DIAGNOSIS — I10 Essential (primary) hypertension: Secondary | ICD-10-CM | POA: Diagnosis not present

## 2022-04-20 DIAGNOSIS — R110 Nausea: Secondary | ICD-10-CM | POA: Diagnosis not present

## 2022-04-20 NOTE — Patient Instructions (Addendum)
[place] at [facility] Discharge Instructions  You were seen today by Tarri Abernethy PA-C for your iron deficiency anemia.  Your iron and blood levels are still slightly low, so we will schedule you for IV iron x2 doses.    FOLLOW-UP APPOINTMENT: Repeat labs and follow-up with phone in 4 months.   Thank you for choosing [place] at [facility] to provide your oncology and hematology care.  To afford each patient quality time with our provider, please arrive at least 15 minutes before your scheduled appointment time.   If you have a lab appointment with the [facility] please come in thru the Main Entrance and check in at the main information desk.  You need to re-schedule your appointment should you arrive 10 or more minutes late.  We strive to give you quality time with our providers, and arriving late affects you and other patients whose appointments are after yours.  Also, if you no show three or more times for appointments you may be dismissed from the clinic at the providers discretion.     Again, thank you for choosing [facility].  Our hope is that these requests will decrease the amount of time that you wait before being seen by our physicians.       _____________________________________________________________  Should you have questions after your visit to [facility], please contact our office at [phone] and follow the prompts.  Our office hours are 8:00 a.m. and 4:30 p.m. Monday - Friday.  Please note that voicemails left after 4:00 p.m. may not be returned until the following business day.  We are closed weekends and major holidays.  You do have access to a nurse 24-7, just call the main number to the clinic [phone] and do not press any options, hold on the line and a nurse will answer the phone.    For prescription refill requests, have your pharmacy contact our office and allow 72 hours.     Due to Covid, you will need to wear a mask upon entering the hospital. If you do not have a mask, a mask will be given to you at the Main Entrance upon arrival. For doctor visits, patients may have 1 support person age 45 or older with them. For treatment visits, patients can not have anyone with them due to social distancing guidelines and our immunocompromised population.

## 2022-04-22 ENCOUNTER — Inpatient Hospital Stay: Payer: Medicare PPO

## 2022-04-22 VITALS — BP 136/61 | HR 72 | Temp 96.7°F | Resp 18

## 2022-04-22 DIAGNOSIS — R110 Nausea: Secondary | ICD-10-CM | POA: Diagnosis not present

## 2022-04-22 DIAGNOSIS — R079 Chest pain, unspecified: Secondary | ICD-10-CM | POA: Diagnosis not present

## 2022-04-22 DIAGNOSIS — R5383 Other fatigue: Secondary | ICD-10-CM | POA: Diagnosis not present

## 2022-04-22 DIAGNOSIS — I252 Old myocardial infarction: Secondary | ICD-10-CM | POA: Diagnosis not present

## 2022-04-22 DIAGNOSIS — I10 Essential (primary) hypertension: Secondary | ICD-10-CM | POA: Diagnosis not present

## 2022-04-22 DIAGNOSIS — G2581 Restless legs syndrome: Secondary | ICD-10-CM | POA: Diagnosis not present

## 2022-04-22 DIAGNOSIS — D509 Iron deficiency anemia, unspecified: Secondary | ICD-10-CM

## 2022-04-22 DIAGNOSIS — E782 Mixed hyperlipidemia: Secondary | ICD-10-CM | POA: Diagnosis not present

## 2022-04-22 DIAGNOSIS — R42 Dizziness and giddiness: Secondary | ICD-10-CM | POA: Diagnosis not present

## 2022-04-22 MED ORDER — ACETAMINOPHEN 325 MG PO TABS
650.0000 mg | ORAL_TABLET | Freq: Once | ORAL | Status: AC
  Administered 2022-04-22: 650 mg via ORAL
  Filled 2022-04-22: qty 2

## 2022-04-22 MED ORDER — SODIUM CHLORIDE 0.9 % IV SOLN: Freq: Once | INTRAVENOUS | Status: AC

## 2022-04-22 MED ORDER — LORATADINE 10 MG PO TABS
10.0000 mg | ORAL_TABLET | Freq: Once | ORAL | Status: AC
  Administered 2022-04-22: 10 mg via ORAL
  Filled 2022-04-22: qty 1

## 2022-04-22 MED ORDER — SODIUM CHLORIDE 0.9 % IV SOLN
300.0000 mg | Freq: Once | INTRAVENOUS | Status: AC
  Administered 2022-04-22: 300 mg via INTRAVENOUS
  Filled 2022-04-22: qty 10

## 2022-04-22 NOTE — Progress Notes (Signed)
Patient presents today for Venofer infusion per providers order.  Vital signs WNL.  Patient has no new complaints at this time.  Peripheral IV started and blood return noted pre and post infusion.  Stable during infusion without adverse affects.  Vital signs stable.  No complaints at this time.  Discharge from clinic ambulatory in stable condition.  Alert and oriented X 3.  Follow up with [facility] as scheduled.  

## 2022-04-22 NOTE — Patient Instructions (Signed)
[facility]  Discharge Instructions: Thank you for choosing [facility] to provide your oncology and hematology care.  If you have a lab appointment with the Cancer Center, please come in thru the Main Entrance and check in at the main information desk.  Wear comfortable clothing and clothing appropriate for easy access to any Portacath or PICC line.   We strive to give you quality time with your provider. You may need to reschedule your appointment if you arrive late (15 or more minutes).  Arriving late affects you and other patients whose appointments are after yours.  Also, if you miss three or more appointments without notifying the office, you may be dismissed from the clinic at the provider's discretion.      For prescription refill requests, have your pharmacy contact our office and allow 72 hours for refills to be completed.    Today you received the following chemotherapy and/or immunotherapy agents Venofer      To help prevent nausea and vomiting after your treatment, we encourage you to take your nausea medication as directed.  BELOW ARE SYMPTOMS THAT SHOULD BE REPORTED IMMEDIATELY: *FEVER GREATER THAN 100.4 F (38 C) OR HIGHER *CHILLS OR SWEATING *NAUSEA AND VOMITING THAT IS NOT CONTROLLED WITH YOUR NAUSEA MEDICATION *UNUSUAL SHORTNESS OF BREATH *UNUSUAL BRUISING OR BLEEDING *URINARY PROBLEMS (pain or burning when urinating, or frequent urination) *BOWEL PROBLEMS (unusual diarrhea, constipation, pain near the anus) TENDERNESS IN MOUTH AND THROAT WITH OR WITHOUT PRESENCE OF ULCERS (sore throat, sores in mouth, or a toothache) UNUSUAL RASH, SWELLING OR PAIN  UNUSUAL VAGINAL DISCHARGE OR ITCHING   Items with * indicate a potential emergency and should be followed up as soon as possible or go to the Emergency Department if any problems should occur.  Please show the CHEMOTHERAPY ALERT CARD or IMMUNOTHERAPY ALERT CARD at check-in to the Emergency  Department and triage nurse.  Should you have questions after your visit or need to cancel or reschedule your appointment, please contact [facility] [phone]  and follow the prompts.  Office hours are 8:00 a.m. to 4:30 p.m. Monday - Friday. Please note that voicemails left after 4:00 p.m. may not be returned until the following business day.  We are closed weekends and major holidays. You have access to a nurse at all times for urgent questions. Please call the main number to the clinic [phone] and follow the prompts.  For any non-urgent questions, you may also contact your provider using MyChart. We now offer e-Visits for anyone 18 and older to request care online for non-urgent symptoms. For details visit [email or web address].   Also download the MyChart app! Go to the app store, search "MyChart", open the app, select [facility], and log in with your MyChart username and password.  Masks are optional in the cancer centers. If you would like for your care team to wear a mask while they are taking care of you, please let them know. You may have one support person who is at least 75 years old accompany you for your appointments.  

## 2022-04-23 ENCOUNTER — Ambulatory Visit: Payer: Self-pay | Admitting: Licensed Clinical Social Worker

## 2022-04-23 ENCOUNTER — Telehealth: Payer: Medicare PPO

## 2022-04-23 NOTE — Patient Instructions (Addendum)
Visit Information  Thank you for taking time to visit with me today. Please don't hesitate to contact me if I can be of assistance to you before our next scheduled telephone appointment.  Following are the goals we discussed today:   Client to communicate with FNP or MD about Care Coordination program support  Please call the care guide team at [phone] if you need to cancel or reschedule your appointment.   If you are experiencing a Mental Health or [place] or need someone to talk to, please go to [facility] [place] [phone])   Following is a copy of your full plan of care:   Care Coordination Interventions:  Active listening / Reflection utilized  Emotional Support Provided Discussed Care Coordination program support with client Reviewed program support with RN Joellyn Quails and with [employer]	[place]. Encouraged client to talk with FNP or MD more about Care coordination program support Reviewed sleep challenges. She said she has difficulty sleeping. Reviewed mood status. Client said she takes Prozac as prescribed. She said she thought Prozac was helping her mood. She said her mood was stable. She did not mention any mood issues Client reported that she has swelling in thumb on right hand and has swelling in thumb on left hand. Encouraged client to call [facility] to talk with nurse, FNP or MD about these issues Client said she recently had iron infusion treatment Provided counseling support for client   Ms. Mclear was given information about Care Management services by the embedded care coordination team including:  Care Management services include personalized support from designated clinical staff supervised by her physician, including individualized plan of care and coordination with other care providers 24/7 contact phone numbers for assistance for urgent and routine care needs. The patient may stop  CCM services at any time (effective at the end of the month) by phone call to the office staff.  Patient agreed to services and verbal consent obtained.   Norva Riffle.Olivea Sonnen MSW, [place] [facility] representative [facility] Care Management [phone]

## 2022-04-23 NOTE — Patient Outreach (Signed)
  Care Coordination   Follow Up Visit Note   04/23/2022 Name: Nicole Horn MRN: [id number] DOB: [birth year]  Nicole Horn is a 76 y.o. year old female who sees [place], Dewaine Conger, FNP for primary care. I spoke with  Lalla Brothers by phone today.  What matters to the patients health and wellness today?  Client wants to manage medical conditions and wants to continue with daily activities of choice     Goals Addressed             This Visit's Progress    Patient wants to manage her medical conditions and continue with daily activities of choice       Care Coordination Interventions:   Active listening / Reflection utilized  Emotional Support Provided Discussed Care Coordination program support with client Reviewed program support with RN Joellyn Quails and with [employer]	[place]. Encouraged client to talk with FNP or MD more about Care coordination program support Reviewed sleep challenges. She said she has difficulty sleeping. Reviewed mood status. Client said she takes Prozac as prescribed. She said she thought Prozac was helping her mood. She said her mood was stable. She did not mention any mood issues Client reported that she has swelling in thumb on right hand and has swelling in thumb on left hand. Encouraged client to call [facility] to talk with nurse, FNP or MD about these issues Client said she recently had iron infusion treatment Provided counseling support for client      SDOH assessments and interventions completed:  Yes  SDOH Interventions Today    Flowsheet Row Most Recent Value  SDOH Interventions   Stress Interventions Provide Counseling  [stress related to managing medical needs]  Depression Interventions/Treatment  Counseling        Care Coordination Interventions Activated:  Yes  Care Coordination Interventions:  Yes, provided   Follow up plan: Client to communicate with FNP or MD about Care Coordination program support    Encounter Outcome:  Pt. Visit Completed

## 2022-04-29 ENCOUNTER — Other Ambulatory Visit: Payer: Self-pay | Admitting: Family Medicine

## 2022-05-01 ENCOUNTER — Other Ambulatory Visit: Payer: Self-pay | Admitting: Obstetrics & Gynecology

## 2022-05-01 ENCOUNTER — Other Ambulatory Visit: Payer: Self-pay | Admitting: Family Medicine

## 2022-05-03 NOTE — Telephone Encounter (Signed)
Rx refill request approved per Dr. Corey's orders. 

## 2022-05-05 ENCOUNTER — Inpatient Hospital Stay: Payer: Medicare PPO

## 2022-05-13 ENCOUNTER — Inpatient Hospital Stay: Payer: Medicare PPO | Attending: Physician Assistant

## 2022-05-13 VITALS — BP 135/53 | HR 72 | Temp 97.6°F | Resp 18

## 2022-05-13 DIAGNOSIS — R110 Nausea: Secondary | ICD-10-CM | POA: Insufficient documentation

## 2022-05-13 DIAGNOSIS — Z8249 Family history of ischemic heart disease and other diseases of the circulatory system: Secondary | ICD-10-CM | POA: Insufficient documentation

## 2022-05-13 DIAGNOSIS — R079 Chest pain, unspecified: Secondary | ICD-10-CM | POA: Diagnosis not present

## 2022-05-13 DIAGNOSIS — D509 Iron deficiency anemia, unspecified: Secondary | ICD-10-CM | POA: Insufficient documentation

## 2022-05-13 DIAGNOSIS — Z823 Family history of stroke: Secondary | ICD-10-CM | POA: Insufficient documentation

## 2022-05-13 DIAGNOSIS — Z800 Family history of malignant neoplasm of digestive organs: Secondary | ICD-10-CM | POA: Insufficient documentation

## 2022-05-13 DIAGNOSIS — Z803 Family history of malignant neoplasm of breast: Secondary | ICD-10-CM | POA: Diagnosis not present

## 2022-05-13 DIAGNOSIS — Z87891 Personal history of nicotine dependence: Secondary | ICD-10-CM | POA: Diagnosis not present

## 2022-05-13 DIAGNOSIS — R5383 Other fatigue: Secondary | ICD-10-CM | POA: Diagnosis not present

## 2022-05-13 DIAGNOSIS — R350 Frequency of micturition: Secondary | ICD-10-CM | POA: Insufficient documentation

## 2022-05-13 DIAGNOSIS — Z79899 Other long term (current) drug therapy: Secondary | ICD-10-CM | POA: Diagnosis not present

## 2022-05-13 MED ORDER — LORATADINE 10 MG PO TABS
10.0000 mg | ORAL_TABLET | Freq: Once | ORAL | Status: AC
  Administered 2022-05-13: 10 mg via ORAL
  Filled 2022-05-13: qty 1

## 2022-05-13 MED ORDER — SODIUM CHLORIDE 0.9 % IV SOLN
300.0000 mg | Freq: Once | INTRAVENOUS | Status: AC
  Administered 2022-05-13: 300 mg via INTRAVENOUS
  Filled 2022-05-13: qty 300

## 2022-05-13 MED ORDER — ACETAMINOPHEN 325 MG PO TABS
650.0000 mg | ORAL_TABLET | Freq: Once | ORAL | Status: AC
  Administered 2022-05-13: 650 mg via ORAL
  Filled 2022-05-13: qty 2

## 2022-05-13 MED ORDER — SODIUM CHLORIDE 0.9 % IV SOLN: Freq: Once | INTRAVENOUS | Status: AC

## 2022-05-13 NOTE — Progress Notes (Signed)
Patient tolerated iron infusion with no complaints voiced.  Peripheral IV site clean and dry with good blood return noted before and after infusion.  Band aid applied.  VSS with discharge and left in satisfactory condition with no s/s of distress noted.   

## 2022-05-13 NOTE — Patient Instructions (Signed)
[place]  Discharge Instructions: Thank you for choosing [facility] to provide your oncology and hematology care.  If you have a lab appointment with the [facility], please come in thru the Main Entrance and check in at the main information desk.  Wear comfortable clothing and clothing appropriate for easy access to any Portacath or PICC line.   We strive to give you quality time with your provider. You may need to reschedule your appointment if you arrive late (15 or more minutes).  Arriving late affects you and other patients whose appointments are after yours.  Also, if you miss three or more appointments without notifying the office, you may be dismissed from the clinic at the provider's discretion.      For prescription refill requests, have your pharmacy contact our office and allow 72 hours for refills to be completed.    Today you received the following:  Venofer iron infusion      To help prevent nausea and vomiting after your treatment, we encourage you to take your nausea medication as directed.  BELOW ARE SYMPTOMS THAT SHOULD BE REPORTED IMMEDIATELY: *FEVER GREATER THAN 100.4 F (38 C) OR HIGHER *CHILLS OR SWEATING *NAUSEA AND VOMITING THAT IS NOT CONTROLLED WITH YOUR NAUSEA MEDICATION *UNUSUAL SHORTNESS OF BREATH *UNUSUAL BRUISING OR BLEEDING *URINARY PROBLEMS (pain or burning when urinating, or frequent urination) *BOWEL PROBLEMS (unusual diarrhea, constipation, pain near the anus) TENDERNESS IN MOUTH AND THROAT WITH OR WITHOUT PRESENCE OF ULCERS (sore throat, sores in mouth, or a toothache) UNUSUAL RASH, SWELLING OR PAIN  UNUSUAL VAGINAL DISCHARGE OR ITCHING   Items with * indicate a potential emergency and should be followed up as soon as possible or go to the Emergency Department if any problems should occur.  Please show the CHEMOTHERAPY ALERT CARD or IMMUNOTHERAPY ALERT CARD at check-in to the Emergency Department and triage  nurse.  Should you have questions after your visit or need to cancel or reschedule your appointment, please contact [place] [phone]  and follow the prompts.  Office hours are 8:00 a.m. to 4:30 p.m. Monday - Friday. Please note that voicemails left after 4:00 p.m. may not be returned until the following business day.  We are closed weekends and major holidays. You have access to a nurse at all times for urgent questions. Please call the main number to the clinic [phone] and follow the prompts.  For any non-urgent questions, you may also contact your provider using MyChart. We now offer e-Visits for anyone 34 and older to request care online for non-urgent symptoms. For details visit [email or web address].   Also download the MyChart app! Go to the app store, search "MyChart", open the app, select [facility], and log in with your MyChart username and password.  Masks are optional in the cancer centers. If you would like for your care team to wear a mask while they are taking care of you, please let them know. You may have one support person who is at least 75 years old accompany you for your appointments.

## 2022-05-13 NOTE — Progress Notes (Signed)
Tolerated infusion w/o adverse reaction. A&Ox4, in no distress. Discharged ambulatory in stable condition.

## 2022-06-07 ENCOUNTER — Telehealth: Payer: Self-pay | Admitting: *Deleted

## 2022-06-07 NOTE — Telephone Encounter (Signed)
Contacted patient regarding PREP Class referral. Has agreed to participate in the next class at the [facility] beginning 07/20/2022.

## 2022-07-01 ENCOUNTER — Encounter: Payer: Self-pay | Admitting: Internal Medicine

## 2022-07-05 ENCOUNTER — Telehealth: Payer: Self-pay | Admitting: *Deleted

## 2022-07-05 NOTE — Telephone Encounter (Signed)
Contacted to arrange PREP class assessment visit. Scheduled for 07/13/2022 at 1130 at the [facility]. Classes to begin 07/20/2022 at 1400-1315.

## 2022-07-06 ENCOUNTER — Ambulatory Visit
Admission: EM | Admit: 2022-07-06 | Discharge: 2022-07-06 | Disposition: A | Discharge status: Home / Self Care | Payer: Medicare PPO | Attending: Nurse Practitioner | Admitting: Nurse Practitioner

## 2022-07-06 ENCOUNTER — Other Ambulatory Visit: Payer: Self-pay

## 2022-07-06 ENCOUNTER — Encounter: Payer: Self-pay | Admitting: Emergency Medicine

## 2022-07-06 DIAGNOSIS — B9689 Other specified bacterial agents as the cause of diseases classified elsewhere: Secondary | ICD-10-CM | POA: Diagnosis not present

## 2022-07-06 DIAGNOSIS — J329 Chronic sinusitis, unspecified: Secondary | ICD-10-CM | POA: Diagnosis not present

## 2022-07-06 MED ORDER — BENZONATATE 100 MG PO CAPS: 100.0000 mg | ORAL_CAPSULE | Freq: Three times a day (TID) | ORAL | 0 refills | Status: DC | PRN

## 2022-07-06 MED ORDER — AMOXICILLIN-POT CLAVULANATE 875-125 MG PO TABS: 1.0000 | ORAL_TABLET | Freq: Two times a day (BID) | ORAL | 0 refills | Status: AC

## 2022-07-06 NOTE — Discharge Instructions (Signed)
You have a sinus infection that is likely caused by bacteria.  Please start the Augmentin (antibiotic) to treat this.  Symptoms should improve over the next week.  If your breathing gets worse, come back to see [place] or follow up with your PCP.  Some things that can make you feel better are: - Increased rest - Increasing fluid with water/sugar free electrolytes - Acetaminophen and ibuprofen as needed for fever/pain - Salt water gargling, chloraseptic spray and throat lozenges - OTC guaifenesin (Mucinex) 600 mg twice daily - Saline sinus flushes or a neti pot - Humidifying the air -Tessalon Perles during the day as needed for dry cough

## 2022-07-06 NOTE — ED Provider Notes (Signed)
[facility]    CSN: [id number] Arrival date & time: 07/06/22  1026      History   Chief Complaint Chief Complaint  Patient presents with   Cough    HPI Nicole Horn is a 76 y.o. female.   Patient presents for approximately 1 week of body aches/chills, congested cough that is improved, wheezing, chest pain when coughing that is now improved, chest tightness that is now improved, chest and nasal congestion, postnasal drainage, headache, sinus pressure above her eyes, nausea without vomiting, decreased appetite, and fatigue.  She denies shortness of breath, sneezing, sore throat, ear pain, abdominal pain, vomiting, diarrhea, loss of taste or smell.  Reports she recently returned from a cruise through [place] for 15 days.  She has been taking cold/flu medication, hot teas, and increasing water intake.  Reports initially, symptoms started improving, but she is still left with significant sinus pressure.    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   Type 2 diabetes mellitus [facility])     Patient Active Problem List   Diagnosis Date Noted   Hepatic steatosis 01/21/2022   Epistaxis 01/21/2022   Compulsive gambling 11/25/2021   Concussion with no loss of consciousness 09/22/2021   Persistent headaches 09/22/2021   Frequent headaches 09/22/2021   Prediabetes 09/22/2021   LFTs abnormal 02/06/2021   Herpes 01/02/2021   Palpitations  11/27/2020   Depression, major, single episode, severe ([place]) 10/22/2020   Insomnia 10/22/2020   Caregiver role strain 07/23/2020   Primary osteoarthritis of first carpometacarpal joint of right hand 07/16/2020   Annual visit for general adult medical examination with abnormal findings 04/16/2020   Postmenopausal 04/16/2020   Screening mammogram, encounter for 04/16/2020   Atypical mole 04/16/2020   Need for immunization against influenza 04/16/2020   Constipation 03/17/2020   PVD (peripheral vascular disease) ([place]) 03/14/2020   CAD (coronary artery disease) 03/14/2020   Iron deficiency anemia 03/14/2020   DOE (dyspnea on exertion) 03/10/2020   H/O adenomatous polyp of colon 01/02/2020   FH: colon cancer 01/02/2020   Heart disease 12/11/2019   Upper airway cough syndrome 11/13/2019   Obesity (BMI 30-39.9) 10/16/2019   Essential hypertension 09/10/2019   Hyperlipemia 09/10/2019   OSA on CPAP 04/18/2019   PAD (peripheral artery disease) ([place]) 05/13/2017   Claudication ([place]) 02/21/2017   Type 2 diabetes mellitus with stage 2 chronic kidney disease, without long-term current use of insulin ([place]) 06/09/2015   Chronic kidney disease, stage II (mild) 03/06/2012    Past Surgical History:  Procedure Laterality Date   ABDOMINAL HYSTERECTOMY     BACK SURGERY     cervical fuision   BARTHOLIN GLAND CYST EXCISION Left 03/04/2015   Procedure: EXCISION OF LEFT BARTHOLINS TUMOR;  Surgeon: [name], MD;  Location: [facility];  Service: Gynecology;  Laterality: Left;   CATARACT EXTRACTION W/PHACO Right 03/27/2021   Procedure: CATARACT  EXTRACTION PHACO AND INTRAOCULAR LENS PLACEMENT (IOC);  Surgeon: Baruch Goldmann, MD;  Location: [facility];  Service: Ophthalmology;  Laterality: Right;  cde 8.45   CATARACT EXTRACTION W/PHACO Left 11/06/2021   Procedure: CATARACT EXTRACTION PHACO AND INTRAOCULAR LENS PLACEMENT (IOC);  Surgeon: Baruch Goldmann, MD;  Location: [facility];  Service: Ophthalmology;   Laterality: Left;  CDE:13.69   CERVICAL SPINE SURGERY     COLONOSCOPY N/A 02/01/2020   Procedure: COLONOSCOPY;  Surgeon: Daneil Dolin, MD; Scattered medium mouth diverticula in the sigmoid and descending colon, otherwise normal exam.   CYST REMOVAL TRUNK     ESOPHAGOGASTRODUODENOSCOPY (EGD) WITH PROPOFOL N/A 03/27/2020   Surgeon: Daneil Dolin, MD;  normal esophagus and stomach, 2 small duodenal erosions.   GIVENS CAPSULE STUDY N/A 03/27/2020   Surgeon: Daneil Dolin, MD;  multiple small bowel angiectasia's   Multiple cyst removal surgeries     Stent of lower extremities      OB History     Gravida  1   Para  1   Term      Preterm  1   AB      Living  1      SAB      IAB      Ectopic      Multiple      Live Births  1            Home Medications    Prior to Admission medications   Medication Sig Start Date End Date Taking? Authorizing Provider  amoxicillin-clavulanate (AUGMENTIN) 875-125 MG tablet Take 1 tablet by mouth 2 (two) times daily for 7 days. 07/06/22 07/13/22 Yes Eulogio Bear, NP  benzonatate (TESSALON) 100 MG capsule Take 1 capsule (100 mg total) by mouth 3 (three) times daily as needed for cough. Do not take with alcohol or while driving or operating heavy machinery.  May cause drowsiness. 07/06/22  Yes Eulogio Bear, NP  albuterol (VENTOLIN HFA) 108 (90 Base) MCG/ACT inhaler Inhale 1-2 puffs into the lungs every 6 (six) hours as needed for wheezing or shortness of breath.    [provider]  amLODipine (NORVASC) 10 MG tablet Take 1 tablet (10 mg total) by mouth daily. Patient taking differently: Take 10 mg by mouth in the morning. 03/16/21   Noreene Larsson, NP  aspirin EC 81 MG tablet Take 81 mg by mouth in the morning. Swallow whole.    [provider]  Blood Glucose Calibration (TRUE METRIX LEVEL 1) Low SOLN 1 application by In Vitro route daily Max Daily Amount: 1 application 4/50/38   [provider]   Blood Glucose Monitoring Suppl (TRUE METRIX GO GLUCOSE METER) w/Device KIT by Does not apply route. 10/28/17   [provider]  Calcium Carbonate Antacid (CALCIUM CARBONATE PO) Take 1,200 mg of elemental calcium by mouth in the morning.    [provider]  clopidogrel (PLAVIX) 75 MG tablet TAKE 1 TABLET BY MOUTH  DAILY 08/28/21   Fayrene Helper, MD  empagliflozin (JARDIANCE) 10 MG TABS tablet Take by mouth. 09/09/21 09/09/22  [provider]  Ferrous Sulfate (IRON PO) Take by mouth daily.    [provider]  FLUoxetine (PROZAC) 10 MG capsule TAKE 1 CAPSULE BY MOUTH EVERY DAY 05/03/22   Gregor Hams, MD  metoprolol succinate (TOPROL XL) 25 MG 24 hr tablet Take 0.5 tablets (12.5 mg total) by mouth daily. 02/16/22   Satira Sark, MD  Multiple Vitamin (MULTIVITAMIN  WITH MINERALS) TABS tablet Take 1 tablet by mouth in the morning. Centrum    [provider]  nortriptyline (PAMELOR) 10 MG capsule TAKE 1 CAPSULE BY MOUTH AT BEDTIME 10/26/21   Gregor Hams, MD  Probiotic Product (DIGESTIVE ADVANTAGE) CAPS Take 1 capsule by mouth daily.    [provider]  rosuvastatin (CRESTOR) 40 MG tablet Take 1 tablet (40 mg total) by mouth daily. Patient taking differently: Take 40 mg by mouth in the morning. 03/23/21   Noreene Larsson, NP  spironolactone (ALDACTONE) 25 MG tablet Take 1 tablet (25 mg total) by mouth daily. 02/01/22   Skeet Latch, MD  valACYclovir (VALTREX) 500 MG tablet TAKE 1 TABLET (500 MG TOTAL) BY MOUTH DAILY. 05/13/22   Florian Buff, MD  vitamin C (ASCORBIC ACID) 500 MG tablet Take 500 mg by mouth 2 (two) times a week.    [provider]    Family History Family History  Problem Relation Age of Onset   Colon cancer Mother        In her 46s   Breast cancer Sister    Stomach cancer Maternal Aunt    Healthy Brother    Stroke Paternal Grandmother    Heart attack Paternal Grandmother     Social History Social History    Tobacco Use   Smoking status: Former    Packs/day: 2.00    Years: 25.00    Total pack years: 50.00    Types: Cigarettes    Quit date: 03/10/1985    Years since quitting: 37.3   Smokeless tobacco: Never  Vaping Use   Vaping Use: Never used  Substance Use Topics   Alcohol use: Not Currently    Comment: socially; rarely   Drug use: No     Allergies   Patient has no known allergies.   Review of Systems Review of Systems Per HPI  Physical Exam Triage Vital Signs ED Triage Vitals  Enc Vitals Group     BP 07/06/22 1158 (!) 163/83     Pulse Rate 07/06/22 1158 68     Resp 07/06/22 1158 20     Temp 07/06/22 1158 98.3 F (36.8 C)     Temp Source 07/06/22 1158 Oral     SpO2 07/06/22 1158 94 %     Weight --      Height --      Head Circumference --      Peak Flow --      Pain Score 07/06/22 1155 0     Pain Loc --      Pain Edu? --      Excl. in [place]? --    No data found.  Updated Vital Signs BP (!) 163/83 (BP Location: Right Arm)   Pulse 68   Temp 98.3 F (36.8 C) (Oral)   Resp 20   SpO2 94%   Visual Acuity Right Eye Distance:   Left Eye Distance:   Bilateral Distance:    Right Eye Near:   Left Eye Near:    Bilateral Near:     Physical Exam Vitals and nursing note reviewed.  Constitutional:      General: She is not in acute distress.    Appearance: Normal appearance. She is not ill-appearing or toxic-appearing.  HENT:     Head: Normocephalic and atraumatic.     Right Ear: Tympanic membrane, ear canal and external ear normal.     Left Ear: Tympanic membrane, ear canal and external ear normal.  Nose: Congestion and rhinorrhea present.     Right Sinus: Maxillary sinus tenderness and frontal sinus tenderness present.     Left Sinus: Maxillary sinus tenderness and frontal sinus tenderness present.     Mouth/Throat:     Mouth: Mucous membranes are moist.     Pharynx: Oropharynx is clear. Posterior oropharyngeal erythema present. No oropharyngeal  exudate.  Eyes:     General: No scleral icterus.    Extraocular Movements: Extraocular movements intact.  Cardiovascular:     Rate and Rhythm: Normal rate and regular rhythm.  Pulmonary:     Effort: Pulmonary effort is normal. No respiratory distress.     Breath sounds: Normal breath sounds. No wheezing, rhonchi or rales.  Abdominal:     General: Abdomen is flat. Bowel sounds are normal. There is no distension.     Palpations: Abdomen is soft.  Musculoskeletal:     Cervical back: Normal range of motion and neck supple.  Lymphadenopathy:     Cervical: No cervical adenopathy.  Skin:    General: Skin is warm and dry.     Capillary Refill: Capillary refill takes less than 2 seconds.     Coloration: Skin is not jaundiced or pale.     Findings: No erythema or rash.  Neurological:     Mental Status: She is alert and oriented to person, place, and time.  Psychiatric:        Behavior: Behavior is cooperative.      UC Treatments / Results  Labs (all labs ordered are listed, but only abnormal results are displayed) Labs Reviewed - No data to display  EKG   Radiology No results found.  Procedures Procedures (including critical care time)  Medications Ordered in UC Medications - No data to display  Initial Impression / Assessment and Plan / UC Course  I have reviewed the triage vital signs and the nursing notes.  Pertinent labs & imaging results that were available during my care of the patient were reviewed by me and considered in my medical decision making (see chart for details).   Patient is well-appearing, afebrile, not tachycardic, not tachypneic, oxygenating well on room air.  Patient is mildly hypertensive today, likely secondary to acute illness.  She has a history of hypertension and reports has been taking her blood pressure medication as prescribed.  Bacterial sinusitis Treat with Augmentin, saline sinus rinses Supportive care discussed-start cough suppressant as  needed for dry cough at nighttime Chest x-ray deferred at this time-vital signs are stable, patient is moving air well, no pain with inspiration, chest pain, shortness of breath actively ER and return precautions discussed  The patient was given the opportunity to ask questions.  All questions answered to their satisfaction.  The patient is in agreement to this plan.    Final Clinical Impressions(s) / UC Diagnoses   Final diagnoses:  Bacterial sinusitis     Discharge Instructions      You have a sinus infection that is likely caused by bacteria.  Please start the Augmentin (antibiotic) to treat this.  Symptoms should improve over the next week.  If your breathing gets worse, come back to see [place] or follow up with your PCP.  Some things that can make you feel better are: - Increased rest - Increasing fluid with water/sugar free electrolytes - Acetaminophen and ibuprofen as needed for fever/pain - Salt water gargling, chloraseptic spray and throat lozenges - OTC guaifenesin (Mucinex) 600 mg twice daily - Saline sinus flushes or a  neti pot - Humidifying the air -Tessalon Perles during the day as needed for dry cough     ED Prescriptions     Medication Sig Dispense Auth. Provider   amoxicillin-clavulanate (AUGMENTIN) 875-125 MG tablet Take 1 tablet by mouth 2 (two) times daily for 7 days. 14 tablet Noemi Chapel A, NP   benzonatate (TESSALON) 100 MG capsule Take 1 capsule (100 mg total) by mouth 3 (three) times daily as needed for cough. Do not take with alcohol or while driving or operating heavy machinery.  May cause drowsiness. 21 capsule Eulogio Bear, NP      PDMP not reviewed this encounter.   Eulogio Bear, NP 07/06/22 1242

## 2022-07-06 NOTE — ED Triage Notes (Signed)
Pt reports cough, chills, intermittent phlegm. Reports recent travel to [place]. Pt reports no change in symptoms with otc medication.

## 2022-07-13 ENCOUNTER — Encounter: Payer: Self-pay | Admitting: *Deleted

## 2022-07-13 NOTE — Progress Notes (Signed)
[facility] PREP Evaluation  Patient Details  Name: Nicole Horn MRN: [id number] Date of Birth: 02-09-1946 Age: 76 y.o. PCP: Johnette Abraham, MD  Vitals:   07/13/22 1145  BP: 138/66  Pulse: 70  Resp: 20  SpO2: 98%  Weight: 161 lb 12.8 oz (73.4 kg)     [facility] Eval - 07/13/22 1145       [facility] "PREP" Location   [facility] "PREP" Location [facility]      Referral    Referring Provider Oval Linsey    Reason for referral Obesitity/Overweight;High Cholesterol;Hypertension   CAD, CKD   Program Start Date 07/20/22      Measurement   Waist Circumference 41.5 inches    Hip Circumference 40 inches      Information for Trainer   Goals establish an exercise routine,increase stamina, weight loss 10-12 lbs over 12 weeks,    Current Exercise stretching, body weight exercises, stationary bike    Orthopedic Concerns C-spine surgery   self reported   Current Barriers none      Mobility and Daily Activities   I find it easy to walk up or down two or more flights of stairs. 4    I have no trouble taking out the trash. 4    I do housework such as vacuuming and dusting on my own without difficulty. 2    I can easily lift a gallon of milk (8lbs). 4    I can easily walk a mile. 1    I have no trouble reaching into high cupboards or reaching down to pick up something from the floor. 4    I do not have trouble doing out-door work such as Armed forces logistics/support/administrative officer, raking leaves, or gardening. 1      Mobility and Daily Activities   I feel younger than my age. 4    I feel independent. 4    I feel energetic. 2    I live an active life.  4    I feel strong. 3    I feel healthy. 3    I feel active as other people my age. 4      How fit and strong are you.   Fit and Strong Total Score 44            Past Medical History:  Diagnosis Date   Benign tumor of Bartholin's gland 02/26/2015   Chronic thumb pain, right    Essential hypertension    Heart attack ([place]) 2004   [place] - hospitalized  with stress test by report and presumably medical therapy; subsequent cardiac catheterization (no PCI)   History of COVID-19 08/2019   History of irregular heartbeat    History of syphilis    Mixed hyperlipidemia    PAD (peripheral artery disease) ([place]) 2019   PTCA/stent [place]   Pneumonia due to COVID-19 virus 09/10/2019   Sleep apnea    Pt supposed to wear CPAP, but doesn't.   Type 2 diabetes mellitus ([place])    Past Surgical History:  Procedure Laterality Date   ABDOMINAL HYSTERECTOMY     BACK SURGERY     cervical fuision   BARTHOLIN GLAND CYST EXCISION Left 03/04/2015   Procedure: EXCISION OF LEFT BARTHOLINS TUMOR;  Surgeon: [name], MD;  Location: [facility];  Service: Gynecology;  Laterality: Left;   CATARACT EXTRACTION W/PHACO Right 03/27/2021   Procedure: CATARACT EXTRACTION PHACO AND INTRAOCULAR LENS PLACEMENT (IOC);  Surgeon: Baruch Goldmann, MD;  Location: [facility];  Service: Ophthalmology;  Laterality: Right;  cde 8.45   CATARACT EXTRACTION W/PHACO Left 11/06/2021   Procedure: CATARACT EXTRACTION PHACO AND INTRAOCULAR LENS PLACEMENT (IOC);  Surgeon: Baruch Goldmann, MD;  Location: [facility];  Service: Ophthalmology;  Laterality: Left;  CDE:13.69   CERVICAL SPINE SURGERY     COLONOSCOPY N/A 02/01/2020   Procedure: COLONOSCOPY;  Surgeon: Daneil Dolin, MD; Scattered medium mouth diverticula in the sigmoid and descending colon, otherwise normal exam.   CYST REMOVAL TRUNK     ESOPHAGOGASTRODUODENOSCOPY (EGD) WITH PROPOFOL N/A 03/27/2020   Surgeon: Daneil Dolin, MD;  normal esophagus and stomach, 2 small duodenal erosions.   GIVENS CAPSULE STUDY N/A 03/27/2020   Surgeon: Daneil Dolin, MD;  multiple small bowel angiectasia's   Multiple cyst removal surgeries     Stent of lower extremities     Social History   Tobacco Use  Smoking Status Former   Packs/day: 2.00   Years: 25.00   Total pack years: 50.00   Types: Cigarettes   Quit date: 03/10/1985   Years since  quitting: 37.3  Smokeless Tobacco Never    Norris Cross 07/13/2022, 3:45 PM  PREP class begins 07/20/2022.  Tuesday/Thursday 2:00-3:15 at the [facility] for 12 weeks.

## 2022-07-20 ENCOUNTER — Encounter: Payer: Self-pay | Admitting: *Deleted

## 2022-07-20 NOTE — Progress Notes (Signed)
[facility] PREP Weekly Session  Patient Details  Name: RAFEEF Horn MRN: [id number] Date of Birth: 04-01-1946 Age: 76 y.o. PCP: Johnette Abraham, MD  Vitals:   07/20/22 1600  Weight: 162 lb (73.5 kg)     [facility] Weekly seesion - 07/20/22 1600       [facility] "PREP" Location   [facility] "PREP" Location [facility]      Weekly Session   Topic Discussed Goal setting and welcome to the program   Introductions, workbook reviewed, tour of facility   Classes attended to date [place], [place] 07/20/2022, 9:46 PM

## 2022-07-23 ENCOUNTER — Encounter: Payer: Medicare PPO | Admitting: Nurse Practitioner

## 2022-07-23 ENCOUNTER — Ambulatory Visit (INDEPENDENT_AMBULATORY_CARE_PROVIDER_SITE_OTHER): Payer: Medicare PPO | Admitting: Internal Medicine

## 2022-07-23 ENCOUNTER — Encounter: Payer: Self-pay | Admitting: Internal Medicine

## 2022-07-23 VITALS — BP 137/75 | HR 71 | Ht 60.0 in | Wt 163.6 lb

## 2022-07-23 DIAGNOSIS — E1122 Type 2 diabetes mellitus with diabetic chronic kidney disease: Secondary | ICD-10-CM | POA: Diagnosis not present

## 2022-07-23 DIAGNOSIS — N182 Chronic kidney disease, stage 2 (mild): Secondary | ICD-10-CM

## 2022-07-23 DIAGNOSIS — Z23 Encounter for immunization: Secondary | ICD-10-CM

## 2022-07-23 DIAGNOSIS — Z0001 Encounter for general adult medical examination with abnormal findings: Secondary | ICD-10-CM | POA: Diagnosis not present

## 2022-07-23 DIAGNOSIS — R7989 Other specified abnormal findings of blood chemistry: Secondary | ICD-10-CM | POA: Diagnosis not present

## 2022-07-23 NOTE — Progress Notes (Signed)
Complete physical exam  Patient: Nicole Horn   DOB: September 23, 1945   76 y.o. Female  MRN: [id number]  Subjective:    Chief Complaint  Patient presents with   Annual Exam    Nicole Horn is a 76 y.o. female who presents today for a complete physical exam. She reports consuming a general diet. Gym/ health club routine includes stationary bike and treadmill. She generally feels well. She reports sleeping poorly. She does not have additional problems to discuss today.   Most recent fall risk assessment:    07/23/2022    1:24 PM  [place] in the past year? 0  Number falls in past yr: 0  Injury with Fall? 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   Type 2 diabetes mellitus ([place])    Past Surgical History:  Procedure Laterality Date   ABDOMINAL HYSTERECTOMY     BACK SURGERY     cervical fuision   BARTHOLIN GLAND CYST EXCISION Left 03/04/2015   Procedure: EXCISION OF LEFT BARTHOLINS TUMOR;  Surgeon: [name], MD;  Location: [facility];  Service: Gynecology;  Laterality: Left;   CATARACT EXTRACTION W/PHACO Right 03/27/2021   Procedure:  CATARACT EXTRACTION PHACO AND INTRAOCULAR LENS PLACEMENT (IOC);  Surgeon: Baruch Goldmann, MD;  Location: [facility];  Service: Ophthalmology;  Laterality: Right;  cde 8.45   CATARACT EXTRACTION W/PHACO Left 11/06/2021   Procedure: CATARACT EXTRACTION PHACO AND INTRAOCULAR LENS PLACEMENT (IOC);  Surgeon: Baruch Goldmann, MD;  Location: [facility];  Service: Ophthalmology;  Laterality: Left;  CDE:13.69   CERVICAL SPINE SURGERY     COLONOSCOPY N/A 02/01/2020   Procedure: COLONOSCOPY;  Surgeon: Daneil Dolin, MD; Scattered medium mouth diverticula in the sigmoid and descending colon, otherwise normal exam.   CYST REMOVAL TRUNK     ESOPHAGOGASTRODUODENOSCOPY (EGD) WITH PROPOFOL N/A 03/27/2020   Surgeon: Daneil Dolin, MD;  normal esophagus and stomach, 2 small duodenal erosions.   GIVENS CAPSULE STUDY N/A 03/27/2020   Surgeon: Daneil Dolin, MD;  multiple small bowel angiectasia's   Multiple cyst removal surgeries     Stent of lower extremities     Social History   Tobacco Use   Smoking status: Former    Packs/day: 2.00    Years: 25.00    Total pack years: 50.00    Types: Cigarettes    Quit date: 03/10/1985    Years since quitting: 37.3   Smokeless tobacco: Never  Vaping Use  Vaping Use: Never used  Substance Use Topics   Alcohol use: Not Currently    Comment: socially; rarely   Drug use: No   Family History  Problem Relation Age of Onset   Colon cancer Mother        In her 98s   Breast cancer Sister    Stomach cancer Maternal Aunt    Healthy Brother    Stroke Paternal Grandmother    Heart attack Paternal Grandmother    No Known Allergies   Patient Care Team: Johnette Abraham, MD as PCP - General (Internal Medicine) Satira Sark, MD as PCP - Cardiology (Cardiology) Gala Romney Cristopher Estimable, MD as Consulting Physician (Gastroenterology) Katha Cabal, LCSW as [place] [employer] (Licensed Clinical Social Worker)   Outpatient Medications Prior to Visit   Medication Sig   albuterol (VENTOLIN HFA) 108 (90 Base) MCG/ACT inhaler Inhale 1-2 puffs into the lungs every 6 (six) hours as needed for wheezing or shortness of breath.   amLODipine (NORVASC) 10 MG tablet Take 1 tablet (10 mg total) by mouth daily. (Patient taking differently: Take 10 mg by mouth in the morning.)   aspirin EC 81 MG tablet Take 81 mg by mouth in the morning. Swallow whole.   benzonatate (TESSALON) 100 MG capsule Take 1 capsule (100 mg total) by mouth 3 (three) times daily as needed for cough. Do not take with alcohol or while driving or operating heavy machinery.  May cause drowsiness.   Blood Glucose Calibration (TRUE METRIX LEVEL 1) Low SOLN 1 application by In Vitro route daily Max Daily Amount: 1 application   Blood Glucose Monitoring Suppl (TRUE METRIX GO GLUCOSE METER) w/Device KIT by Does not apply route.   Calcium Carbonate Antacid (CALCIUM CARBONATE PO) Take 1,200 mg of elemental calcium by mouth in the morning.   cholecalciferol (VITAMIN D3) 25 MCG (1000 UNIT) tablet Take 1,000 Units by mouth daily.   clopidogrel (PLAVIX) 75 MG tablet TAKE 1 TABLET BY MOUTH  DAILY   empagliflozin (JARDIANCE) 10 MG TABS tablet Take by mouth.   Ferrous Sulfate (IRON PO) Take by mouth daily.   FLUoxetine (PROZAC) 10 MG capsule TAKE 1 CAPSULE BY MOUTH EVERY DAY   metoprolol succinate (TOPROL XL) 25 MG 24 hr tablet Take 0.5 tablets (12.5 mg total) by mouth daily.   Multiple Vitamin (MULTIVITAMIN WITH MINERALS) TABS tablet Take 1 tablet by mouth in the morning. Centrum   nortriptyline (PAMELOR) 10 MG capsule TAKE 1 CAPSULE BY MOUTH AT BEDTIME   Probiotic Product (DIGESTIVE ADVANTAGE) CAPS Take 1 capsule by mouth daily.   rosuvastatin (CRESTOR) 40 MG tablet Take 1 tablet (40 mg total) by mouth daily. (Patient taking differently: Take 40 mg by mouth in the morning.)   spironolactone (ALDACTONE) 25 MG tablet Take 1 tablet (25 mg total) by mouth daily.   valACYclovir (VALTREX) 500 MG tablet  TAKE 1 TABLET (500 MG TOTAL) BY MOUTH DAILY.   vitamin C (ASCORBIC ACID) 500 MG tablet Take 500 mg by mouth 2 (two) times a week.   No facility-administered medications prior to visit.   Review of Systems  HENT:  Positive for congestion.   All other systems reviewed and are negative.     Objective:     BP 137/75   Pulse 71   Ht 5' (1.524 m)   Wt 163 lb 9.6 oz (74.2 kg)   SpO2 92%   BMI 31.95 kg/m  BP Readings from Last 3 Encounters:  07/23/22 137/75  07/13/22 138/66  07/06/22 (!) 163/83   Physical Exam Vitals reviewed.  Constitutional:      General: She is not in acute distress.    Appearance: Normal appearance. She is obese. She is not toxic-appearing.  HENT:     Head: Normocephalic and atraumatic.     Right Ear: External ear normal.     Left Ear: External ear normal.     Nose: Nose normal. No congestion or rhinorrhea.     Mouth/Throat:     Mouth: Mucous membranes are moist.     Pharynx: Oropharynx is clear. No oropharyngeal exudate or posterior oropharyngeal erythema.  Eyes:     General: No scleral icterus.    Extraocular Movements: Extraocular movements intact.     Conjunctiva/sclera: Conjunctivae normal.     Pupils: Pupils are equal, round, and reactive to light.  Cardiovascular:     Rate and Rhythm: Normal rate and regular rhythm.     Pulses: Normal pulses.     Heart sounds: Normal heart sounds. No murmur heard.    No friction rub. No gallop.  Pulmonary:     Effort: Pulmonary effort is normal.     Breath sounds: Normal breath sounds. No wheezing, rhonchi or rales.  Abdominal:     General: Abdomen is flat. Bowel sounds are normal. There is no distension.     Palpations: Abdomen is soft.     Tenderness: There is no abdominal tenderness.  Musculoskeletal:        General: No swelling. Normal range of motion.     Cervical back: Normal range of motion.     Right lower leg: No edema.     Left lower leg: No edema.  Lymphadenopathy:     Cervical: No cervical  adenopathy.  Skin:    General: Skin is warm and dry.     Capillary Refill: Capillary refill takes less than 2 seconds.     Coloration: Skin is not jaundiced.  Neurological:     General: No focal deficit present.     Mental Status: She is alert and oriented to person, place, and time.  Psychiatric:        Mood and Affect: Mood normal.        Behavior: Behavior normal.     Diabetic foot exam was performed.  No deformities or other abnormal visual findings.  Posterior tibialis and dorsalis pulse intact bilaterally.  Intact to touch and monofilament testing bilaterally.    Last CBC Lab Results  Component Value Date   WBC 4.1 04/12/2022   HGB 11.9 (L) 04/12/2022   HCT 38.9 04/12/2022   MCV 84.0 04/12/2022   MCH 25.7 (L) 04/12/2022   RDW 15.9 (H) 04/12/2022   PLT 307 29/93/7169   Last metabolic panel Lab Results  Component Value Date   GLUCOSE 154 (H) 08/20/2021   NA 144 08/20/2021   K 4.1 08/20/2021   CL 109 (H) 08/20/2021   CO2 23 08/20/2021   BUN 13 08/20/2021   CREATININE 0.94 08/20/2021   EGFR 64 08/20/2021   CALCIUM 9.7 08/20/2021   PROT 7.3 08/10/2021   ALBUMIN 4.5 05/13/2021   LABGLOB 2.6 05/13/2021   AGRATIO 1.7 05/13/2021   BILITOT 0.4 08/10/2021   ALKPHOS 159 (H) 05/13/2021   AST 25 08/10/2021   ALT 29 08/10/2021   ANIONGAP 10 08/01/2021   Last lipids Lab Results  Component Value Date   CHOL 115 08/10/2021   HDL 47 (L) 08/10/2021   LDLCALC 50 08/10/2021  TRIG 99 08/10/2021   CHOLHDL 2.4 08/10/2021   Last hemoglobin A1c Lab Results  Component Value Date   HGBA1C 5.7 (H) 12/12/2019   Last vitamin D Lab Results  Component Value Date   VD25OH 34.12 04/24/2020   Last vitamin B12 and Folate Lab Results  Component Value Date   VITAMINB12 328 06/06/2020   FOLATE 32.2 06/06/2020      Assessment & Plan:    Routine Health Maintenance and Physical Exam  Immunization History  Administered Date(s) Administered   Fluad Quad(high Dose 65+)  06/19/2022   Influenza,inj,Quad PF,6+ Mos 04/16/2020   Influenza-Unspecified 06/19/2019, 07/08/2021   Moderna SARS-COV2 Booster Vaccination 07/08/2021, 06/19/2022   Moderna Sars-Covid-2 Vaccination 11/02/2019, 12/05/2019   PNEUMOCOCCAL CONJUGATE-20 07/23/2022   Zoster Recombinat (Shingrix) 12/05/2021    Health Maintenance  Topic Date Due   DTaP/Tdap/Td (1 - Tdap) Never done   HEMOGLOBIN A1C  06/12/2020   Zoster Vaccines- Shingrix (2 of 2) 01/30/2022   COVID-19 Vaccine (3 - Moderna risk series) 07/17/2022   Diabetic kidney evaluation - GFR measurement  08/20/2022   Medicare Annual Wellness (AWV)  10/20/2022   OPHTHALMOLOGY EXAM  07/06/2023   Diabetic kidney evaluation - Urine ACR  07/20/2023   FOOT EXAM  07/24/2023   COLONOSCOPY (Pts 45-88yr Insurance coverage will need to be confirmed)  01/31/2030   Pneumonia Vaccine 76 Years old  Completed   INFLUENZA VACCINE  Completed   DEXA SCAN  Completed   Hepatitis C Screening  Completed   HPV VACCINES  Aged Out    Discussed health benefits of physical activity, and encouraged her to engage in regular exercise appropriate for her age and condition.  Problem List Items Addressed This Visit       Encounter for well adult exam with abnormal findings    Presenting today for her annual exam.  Recent records and labs have been reviewed. -Repeat labs ordered today, including HFP and A1c -PCV 20 administered today -Diabetic foot exam completed today -We will plan for follow-up in 6 months      Return in about 6 months (around 01/22/2023).  PJohnette Abraham MD

## 2022-07-23 NOTE — Assessment & Plan Note (Addendum)
Presenting today for her annual exam.  Recent records and labs have been reviewed. -Repeat labs ordered today, including HFP and A1c -PCV 20 administered today -Diabetic foot exam completed today -We will plan for follow-up in 6 months

## 2022-07-23 NOTE — Patient Instructions (Signed)
It was a pleasure to see you today.  Thank you for giving [place] the opportunity to be involved in your care.  Below is a brief recap of your visit and next steps.  We will plan to see you again in 6 months.  Summary We completed your annual exam today Labs have been ordered and you will receive your pneumonia vaccine Follow up in 6 months

## 2022-07-24 LAB — HEPATIC FUNCTION PANEL
ALT: 31 IU/L (ref 0–32)
AST: 26 IU/L (ref 0–40)
Albumin: 4.5 g/dL (ref 3.8–4.8)
Alkaline Phosphatase: 118 IU/L (ref 44–121)
Bilirubin Total: 0.2 mg/dL (ref 0.0–1.2)
Bilirubin, Direct: <0.1 mg/dL (ref 0.00–0.40)
Total Protein: 7.7 g/dL (ref 6.0–8.5)

## 2022-07-24 LAB — HEMOGLOBIN A1C
Est. average glucose Bld gHb Est-mCnc: 160 mg/dL
Hgb A1c MFr Bld: 7.2 % — ABNORMAL HIGH (ref 4.8–5.6)

## 2022-07-26 ENCOUNTER — Other Ambulatory Visit: Payer: Self-pay

## 2022-07-28 ENCOUNTER — Encounter: Payer: Self-pay | Admitting: *Deleted

## 2022-07-28 NOTE — Progress Notes (Signed)
[facility] PREP Weekly Session  Patient Details  Name: Nicole Horn MRN: [id number] Date of Birth: [birth year] Age: 76 y.o. PCP: Johnette Abraham, MD  Vitals:   07/27/22 1600  Weight: 163 lb (73.9 kg)     [facility] Weekly seesion - 07/27/22 1600       [facility] "PREP" Location   [facility] "PREP" Location [facility]      Weekly Session   Topic Discussed Importance of resistance training;Other ways to be active   National standard recommendations for cardio and strength training discussed. [facility] class schedule reviewed. No sitting longer than 30 minutes.   Classes attended to date [place] 07/28/2022, 10:11 AM

## 2022-08-04 ENCOUNTER — Encounter: Payer: Self-pay | Admitting: *Deleted

## 2022-08-04 NOTE — Progress Notes (Signed)
[facility] PREP Weekly Session  Patient Details  Name: Nicole Horn MRN: [id number] Date of Birth: 1946-06-08 Age: 76 y.o. PCP: Johnette Abraham, MD  Vitals:   08/03/22 1600  Weight: 167 lb (75.8 kg)     [facility] Weekly seesion - 08/03/22 1600       [facility] "PREP" Location   [facility] "PREP" Location [facility]      Weekly Session   Topic Discussed Eating for the season;Healthy eating tips    Minutes exercised this week 55 minutes    Classes attended to date [place], [place] 08/04/2022, 10:01 AM

## 2022-08-10 ENCOUNTER — Encounter: Payer: Self-pay | Admitting: *Deleted

## 2022-08-10 NOTE — Progress Notes (Signed)
[facility] PREP Weekly Session  Patient Details  Name: Nicole Horn MRN: [id number] Date of Birth: 17-Sep-1945 Age: 76 y.o. PCP: Johnette Abraham, MD  Vitals:   08/10/22 1607  Weight: 163 lb (73.9 kg)     [facility] Weekly seesion - 08/10/22 1600       [facility] "PREP" Location   [facility] "PREP" Location [place]      Weekly Session   Topic Discussed Health habits;Water   Water intake: 1/2 body weight in oz or 64oz, sugar demo, Ways to reduce sugar. Added sugars: 24gms/day for women, 36gms/day for  men.   Classes attended to date [place] 08/10/2022, 4:16 PM

## 2022-08-12 ENCOUNTER — Encounter: Payer: Self-pay | Admitting: Cardiology

## 2022-08-12 ENCOUNTER — Ambulatory Visit: Payer: Medicare PPO | Attending: Cardiology | Admitting: Cardiology

## 2022-08-12 VITALS — BP 120/80 | HR 75 | Ht 60.0 in | Wt 162.0 lb

## 2022-08-12 DIAGNOSIS — I10 Essential (primary) hypertension: Secondary | ICD-10-CM

## 2022-08-12 DIAGNOSIS — E782 Mixed hyperlipidemia: Secondary | ICD-10-CM | POA: Diagnosis not present

## 2022-08-12 DIAGNOSIS — I25119 Atherosclerotic heart disease of native coronary artery with unspecified angina pectoris: Secondary | ICD-10-CM | POA: Diagnosis not present

## 2022-08-12 MED ORDER — AMLODIPINE BESYLATE 10 MG PO TABS: 10.0000 mg | ORAL_TABLET | Freq: Every day | ORAL | 2 refills | Status: DC

## 2022-08-12 MED ORDER — METOPROLOL SUCCINATE ER 25 MG PO TB24: 12.5000 mg | ORAL_TABLET | Freq: Every day | ORAL | 2 refills | Status: DC

## 2022-08-12 NOTE — Progress Notes (Signed)
Cardiology Office Note  Date: 08/12/2022   ID: Nicole, Horn Apr 19, 1946, MRN [id number]  PCP:  Johnette Abraham, MD  Cardiologist:  Rozann Lesches, MD Electrophysiologist:  None   Chief Complaint  Patient presents with   Cardiac follow-up    History of Present Illness: Nicole Horn is a 76 y.o. female last seen in June.  She is here for a follow-up visit.  States that she is getting over an upper respiratory tract infection treated with antibiotics and cough suppressant.  She had been on a cruise out of the country a month or so prior to this, reportedly negative for COVID-19.  In the last week she was feeling dizzy and stopped her antihypertensive medications.  Blood pressure today is 120/80.  I have asked her to track this carefully at home but she will likely need resumption gradually.  She had been on Norvasc and Aldactone, tolerating well.  She does not describe any angina at this time, no sense of palpitations.  Otherwise has continued on antiplatelet regimen, Jardiance, and Crestor.  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   Type 2 diabetes mellitus ([place])     Past Surgical History:  Procedure Laterality Date   ABDOMINAL HYSTERECTOMY     BACK SURGERY     cervical fuision   BARTHOLIN GLAND CYST EXCISION Left 03/04/2015   Procedure: EXCISION OF LEFT BARTHOLINS TUMOR;  Surgeon: [name], MD;  Location: [facility];   Service: Gynecology;  Laterality: Left;   CATARACT EXTRACTION W/PHACO Right 03/27/2021   Procedure: CATARACT EXTRACTION PHACO AND INTRAOCULAR LENS PLACEMENT (IOC);  Surgeon: Baruch Goldmann, MD;  Location: [facility];  Service: Ophthalmology;  Laterality: Right;  cde 8.45   CATARACT EXTRACTION W/PHACO Left 11/06/2021   Procedure: CATARACT EXTRACTION PHACO AND INTRAOCULAR LENS PLACEMENT (IOC);  Surgeon: Baruch Goldmann, MD;  Location: [facility];  Service: Ophthalmology;  Laterality: Left;  CDE:13.69   CERVICAL SPINE SURGERY     COLONOSCOPY N/A 02/01/2020   Procedure: COLONOSCOPY;  Surgeon: Daneil Dolin, MD; Scattered medium mouth diverticula in the sigmoid and descending colon, otherwise normal exam.   CYST REMOVAL TRUNK     ESOPHAGOGASTRODUODENOSCOPY (EGD) WITH PROPOFOL N/A 03/27/2020   Surgeon: Daneil Dolin, MD;  normal esophagus and stomach, 2 small duodenal erosions.   GIVENS CAPSULE STUDY N/A 03/27/2020   Surgeon: Daneil Dolin, MD;  multiple small bowel angiectasia's   Multiple cyst removal surgeries     Stent of lower extremities      Current Outpatient Medications  Medication Sig Dispense Refill   albuterol (VENTOLIN HFA) 108 (90 Base) MCG/ACT inhaler Inhale 1-2 puffs into the lungs every 6 (six) hours as needed for wheezing or shortness of breath.     aspirin EC 81 MG tablet Take 81 mg by mouth in the morning. Swallow whole.     benzonatate (TESSALON) 100  MG capsule Take 1 capsule (100 mg total) by mouth 3 (three) times daily as needed for cough. Do not take with alcohol or while driving or operating heavy machinery.  May cause drowsiness. 21 capsule 0   Blood Glucose Calibration (TRUE METRIX LEVEL 1) Low SOLN 1 application by In Vitro route daily Max Daily Amount: 1 application     Blood Glucose Monitoring Suppl (TRUE METRIX GO GLUCOSE METER) w/Device KIT by Does not apply route.     Calcium Carbonate Antacid (CALCIUM CARBONATE PO) Take 1,200 mg of elemental calcium by mouth in the  morning.     cholecalciferol (VITAMIN D3) 25 MCG (1000 UNIT) tablet Take 1,000 Units by mouth daily.     clopidogrel (PLAVIX) 75 MG tablet TAKE 1 TABLET BY MOUTH  DAILY 90 tablet 3   empagliflozin (JARDIANCE) 10 MG TABS tablet Take by mouth.     Ferrous Sulfate (IRON PO) Take by mouth daily.     FLUoxetine (PROZAC) 10 MG capsule TAKE 1 CAPSULE BY MOUTH EVERY DAY 90 capsule 1   Multiple Vitamin (MULTIVITAMIN WITH MINERALS) TABS tablet Take 1 tablet by mouth in the morning. Centrum     nortriptyline (PAMELOR) 10 MG capsule TAKE 1 CAPSULE BY MOUTH AT BEDTIME 180 capsule 1   Probiotic Product (DIGESTIVE ADVANTAGE) CAPS Take 1 capsule by mouth daily.     rosuvastatin (CRESTOR) 40 MG tablet Take 1 tablet (40 mg total) by mouth daily. 90 tablet 1   spironolactone (ALDACTONE) 25 MG tablet Take 1 tablet (25 mg total) by mouth daily. 90 tablet 3   valACYclovir (VALTREX) 500 MG tablet TAKE 1 TABLET (500 MG TOTAL) BY MOUTH DAILY. 90 tablet 3   vitamin C (ASCORBIC ACID) 500 MG tablet Take 500 mg by mouth 2 (two) times a week.     amLODipine (NORVASC) 10 MG tablet Take 1 tablet (10 mg total) by mouth daily. 90 tablet 2   metoprolol succinate (TOPROL XL) 25 MG 24 hr tablet Take 0.5 tablets (12.5 mg total) by mouth daily. 45 tablet 2   No current facility-administered medications for this visit.   Allergies:  Patient has no known allergies.   ROS: No syncope.  Physical Exam: VS:  BP 120/80   Pulse 75   Ht 5' (1.524 m)   Wt 162 lb (73.5 kg)   SpO2 96%   BMI 31.64 kg/m , BMI Body mass index is 31.64 kg/m.  Wt Readings from Last 3 Encounters:  08/12/22 162 lb (73.5 kg)  08/10/22 163 lb (73.9 kg)  08/03/22 167 lb (75.8 kg)    General: Patient appears comfortable at rest. HEENT: Conjunctiva and lids normal. Neck: Supple, no elevated JVP or carotid bruits. Lungs: Clear to auscultation, nonlabored breathing at rest. Cardiac: Regular rate and rhythm, no S3, 1/6 systolic murmur. Extremities: No  pitting edema.  ECG:  An ECG dated 01/01/2022 was personally reviewed today and demonstrated:  Sinus rhythm with nonspecific T wave changes.  Recent Labwork: 08/20/2021: BUN 13; Creatinine, Ser 0.94; Potassium 4.1; Sodium 144 04/12/2022: Hemoglobin 11.9; Platelets 307 07/23/2022: ALT 31; AST 26     Component Value Date/Time   CHOL 115 08/10/2021 1218   CHOL 139 05/13/2021 1358   TRIG 99 08/10/2021 1218   HDL 47 (L) 08/10/2021 1218   HDL 66 05/13/2021 1358   CHOLHDL 2.4 08/10/2021 1218   LDLCALC 50 08/10/2021 1218    Other Studies Reviewed Today:  Echocardiogram 03/04/2020:  1. Left ventricular ejection fraction, by estimation, is  60 to 65%. The  left ventricle has normal function. The left ventricle has no regional  wall motion abnormalities. There is mild left ventricular hypertrophy.  Left ventricular diastolic parameters  are consistent with Grade I diastolic dysfunction (impaired relaxation).   2. Right ventricular systolic function is normal. The right ventricular  size is normal. There is normal pulmonary artery systolic pressure.   3. Right atrial size was mildly dilated.   4. The mitral valve is normal in structure. No evidence of mitral valve  regurgitation. No evidence of mitral stenosis.   5. The aortic valve is tricuspid. Aortic valve regurgitation is not  visualized. No aortic stenosis is present.   6. The inferior vena cava is normal in size with greater than 50%  respiratory variability, suggesting right atrial pressure of 3 mmHg.    Cardiac monitor June 2022: ZIO XT reviewed.  8 days, 11 hours analyzed.  Predominant rhythm is sinus with heart rate ranging from 48 bpm up to 118 bpm and average heart rate 76 bpm.  There were rare PACs including couplets and triplets representing less than 1% total beats.  Rare PVCs were noted representing less than 1% total beats.  There were 2 very brief episodes of SVT, the longest of which was only 5 beats.  No sustained arrhythmias  or pauses.  Assessment and Plan:  1.  Essential hypertension.  I have asked her to monitor blood pressure and then gradually resume Norvasc and Aldactone which she had tolerated well previously.  2.  Intermittent palpitations with brief episodes of SVT by cardiac monitor.  Prescription provided for Toprol-XL 12.5 g daily as before.  She states that she had run out.  3.  CAD by history with plan to continue medical therapy in the absence of angina symptoms.  Currently on aspirin, Jardiance, and Crestor.  Last LDL 50.  Medication Adjustments/Labs and Tests Ordered: Current medicines are reviewed at length with the patient today.  Concerns regarding medicines are outlined above.   Tests Ordered: No orders of the defined types were placed in this encounter.   Medication Changes: Meds ordered this encounter  Medications   amLODipine (NORVASC) 10 MG tablet    Sig: Take 1 tablet (10 mg total) by mouth daily.    Dispense:  90 tablet    Refill:  2   metoprolol succinate (TOPROL XL) 25 MG 24 hr tablet    Sig: Take 0.5 tablets (12.5 mg total) by mouth daily.    Dispense:  45 tablet    Refill:  2    02/16/2022 NEW    Disposition:  Follow up  6 months.  Signed, Satira Sark, MD, [facility] 08/12/2022 2:51 PM    [place] [facility] [address] [place], [place] [address] Phone: [phone]; Fax: [phone]

## 2022-08-12 NOTE — Patient Instructions (Signed)
Medication Instructions:  Your physician recommends that you continue on your current medications as directed. Please refer to the Current Medication list given to you today.   Labwork: None today  Testing/Procedures: None today  Follow-Up: 6 months  Any Other Special Instructions Will Be Listed Below (If Applicable).  If you need a refill on your cardiac medications before your next appointment, please call your pharmacy.  

## 2022-08-24 ENCOUNTER — Encounter: Payer: Self-pay | Admitting: *Deleted

## 2022-08-24 NOTE — Progress Notes (Signed)
[facility] PREP Weekly Session  Patient Details  Name: Nicole Horn MRN: [id number] Date of Birth: [birth year] Age: 77 y.o. PCP: Johnette Abraham, MD  Vitals:   08/24/22 1642  Weight: 160 lb (72.6 kg)     [facility] Weekly seesion - 08/24/22 1600       [facility] "PREP" Location   [facility]      Weekly Session   Topic Discussed Restaurant Eating   Label review, salt intake '1500mg'$ -'2300mg'$ /day salt demo.   Minutes exercised this week 75 minutes    Classes attended to date [place], [place] 08/24/2022, 4:44 PM

## 2022-08-26 ENCOUNTER — Inpatient Hospital Stay: Payer: Medicare PPO

## 2022-08-31 ENCOUNTER — Encounter: Payer: Self-pay | Admitting: *Deleted

## 2022-08-31 ENCOUNTER — Inpatient Hospital Stay: Payer: Medicare PPO | Attending: Hematology

## 2022-08-31 DIAGNOSIS — D509 Iron deficiency anemia, unspecified: Secondary | ICD-10-CM

## 2022-08-31 LAB — CBC WITH DIFFERENTIAL/PLATELET
Abs Immature Granulocytes: 0.01 10*3/uL (ref 0.00–0.07)
Basophils Absolute: 0 10*3/uL (ref 0.0–0.1)
Basophils Relative: 1 %
Eosinophils Absolute: 0.2 10*3/uL (ref 0.0–0.5)
Eosinophils Relative: 5 %
HCT: 47 % — ABNORMAL HIGH (ref 36.0–46.0)
Hemoglobin: 14.4 g/dL (ref 12.0–15.0)
Immature Granulocytes: 0 %
Lymphocytes Relative: 45 %
Lymphs Abs: 1.5 10*3/uL (ref 0.7–4.0)
MCH: 26 pg (ref 26.0–34.0)
MCHC: 30.6 g/dL (ref 30.0–36.0)
MCV: 84.8 fL (ref 80.0–100.0)
Monocytes Absolute: 0.3 10*3/uL (ref 0.1–1.0)
Monocytes Relative: 10 %
Neutro Abs: 1.3 10*3/uL — ABNORMAL LOW (ref 1.7–7.7)
Neutrophils Relative %: 39 %
Platelets: 252 10*3/uL (ref 150–400)
RBC: 5.54 MIL/uL — ABNORMAL HIGH (ref 3.87–5.11)
RDW: 14.6 % (ref 11.5–15.5)
WBC: 3.3 10*3/uL — ABNORMAL LOW (ref 4.0–10.5)
nRBC: 0 % (ref 0.0–0.2)

## 2022-08-31 LAB — COMPREHENSIVE METABOLIC PANEL
ALT: 29 U/L (ref 0–44)
AST: 24 U/L (ref 15–41)
Albumin: 4 g/dL (ref 3.5–5.0)
Alkaline Phosphatase: 93 U/L (ref 38–126)
Anion gap: 6 (ref 5–15)
BUN: 16 mg/dL (ref 8–23)
CO2: 29 mmol/L (ref 22–32)
Calcium: 9.7 mg/dL (ref 8.9–10.3)
Chloride: 105 mmol/L (ref 98–111)
Creatinine, Ser: 1.07 mg/dL — ABNORMAL HIGH (ref 0.44–1.00)
GFR, Estimated: 54 mL/min — ABNORMAL LOW (ref >60)
Glucose, Bld: 110 mg/dL — ABNORMAL HIGH (ref 70–99)
Potassium: 3.9 mmol/L (ref 3.5–5.1)
Sodium: 140 mmol/L (ref 135–145)
Total Bilirubin: 0.4 mg/dL (ref 0.3–1.2)
Total Protein: 7.5 g/dL (ref 6.5–8.1)

## 2022-08-31 LAB — IRON AND TIBC
Iron: 69 ug/dL (ref 28–170)
Saturation Ratios: 18 % (ref 10.4–31.8)
TIBC: 376 ug/dL (ref 250–450)
UIBC: 307 ug/dL

## 2022-08-31 LAB — FERRITIN: Ferritin: 88 ng/mL (ref 11–307)

## 2022-08-31 NOTE — Progress Notes (Signed)
[facility] PRE[facility] Weekly Session  Patient Details  Name: Nicole Horn MRN: [id number] Date of Birth: 07/15/1946 Age: 77 y.o. PCP: Johnette Abraham, MD  Vitals:   08/31/22 1633  Weight: 159 lb (72.1 kg)     [facility] Weekly seesion - 08/31/22 1600       [facility] "PREP" Location   [facility] "PREP" Location [facility]      Weekly Session   Topic Discussed Stress management and problem solving;Other   Importance of sleep and tips for improving sleep quality. Introduction to meditation.   Minutes exercised this week 70 minutes    Classes attended to date [place], [place] 08/31/2022, 4:36 PM

## 2022-09-01 NOTE — Progress Notes (Deleted)
VIRTUAL VISIT via [place]   I connected with Nicole Horn  on *** at  *** by telephone and verified that I am speaking with the correct person using two identifiers.  Location: Patient: Home Provider: [facility]   I discussed the limitations, risks, security and privacy concerns of performing an evaluation and management service by telephone and the availability of in person appointments. I also discussed with the patient that there may be a patient responsible charge related to this service. The patient expressed understanding and agreed to proceed.  REASON FOR VISIT:  Follow-up for iron deficiency anemia   CURRENT THERAPY: Iron tablet, intermittent IV iron  INTERVAL HISTORY:  Ms. MAYO HORNACEK is contacted today for follow-up of iron deficiency anemia.  She was last seen by Tarri Abernethy PA-C on 04/20/2022, and received Venofer 300 mg on 04/22/2022 and 05/13/2022.  At today's visit, she reports feeling ***.  No recent hospitalizations, surgeries, or changes in baseline health status.   She has been taking oral iron supplementation every day.  *** *** She has intermittent scant rectal bleeding thought to be from hemorrhoids. *** She denies any gross hematochezia, melena, or epistaxis; stool has been drarker from taking iron. *** Her energy level has improved after her IV iron in August/September 2023.  Pica has resolved.  Restless legs and lightheadedness has improved. *** No recent headaches, dyspnea, or syncope. 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 She has ***% energy and ***% appetite. She endorses that she is maintaining a stable weight.  She has been exercising more frequently lately.   REVIEW OF SYSTEMS: ***  ROS   PHYSICAL EXAM: (per limitations of virtual telephone visit)  The patient is alert and oriented x 3,  exhibiting adequate mentation, good mood, and ability to speak in full sentences and execute sound judgement.***  ASSESSMENT & PLAN:  1.  Microcytic anemia / iron deficiency anemia: - She had a drop in hemoglobin to 6 and MCV to 64.7 on 03/14/2020, status post 2 units PRBC transfusion.  Ferritin was 3 with normal folic acid and [id number]. - SPEP was negative - Colonoscopy on 02/01/2020 shows diverticulosis in the sigmoid colon and the descending colon. - EGD on 03/27/2020 shows normal esophagus, normal stomach, 2 small duodenal erosions. - Givens capsule placed during EGD (03/27/2020): Multiple small bowel angiectasias, likely source of IDA - CTAP on 03/15/2020 shows normal liver and normal-sized spleen. - Most recent IV iron with Venofer 300 mg x 2 August/September 2023 - She is taking her ferrous sulfate 325 mg daily*** - No major bleeding episodes such as hematemesis, hematochezia, or melena   *** - She follows with [facility] - Most recent labs (08/31/2022): Hgb 14.4/MCV 84.8, ferritin 88, iron saturation 18 % - PLAN: Recommend IV iron with Venofer 300 mg x 1 *** if symptomatic - Continue daily iron supplement - RTC in 4 months for repeat labs and PHONE visit, or sooner if needed based on symptoms.    ***   2.  Family history: - Sister had breast cancer.  Mother had colon cancer.  Maternal aunt had stomach cancer.   PLAN SUMMARY: >> *** >> *** >> ***    I discussed the assessment and treatment plan with the patient. The patient was provided an opportunity to ask questions and all were answered. The patient agreed with the plan and demonstrated  an understanding of the instructions.   The patient was advised to call back or seek an in-person evaluation if the symptoms worsen or if the condition fails to improve as anticipated.  I provided *** minutes of non-face-to-face time during this encounter.   Harriett Rush, PA-C ***

## 2022-09-02 ENCOUNTER — Telehealth: Payer: Self-pay | Admitting: Physician Assistant

## 2022-09-02 ENCOUNTER — Inpatient Hospital Stay: Payer: Medicare PPO | Admitting: Physician Assistant

## 2022-09-02 NOTE — Telephone Encounter (Signed)
Unable to reach patient for telephone visit today.  Nurse called patient 4 times, with no answer.  Message was left on patient's voicemail.  I called patient at 4:15 PM, left message.  We will mark her phone visit as "no-show" and attempt to reschedule her at her convenience.

## 2022-09-13 ENCOUNTER — Other Ambulatory Visit: Payer: Self-pay | Admitting: Family Medicine

## 2022-09-13 NOTE — Telephone Encounter (Signed)
Rx refill request approved per Dr. Corey's orders. 

## 2022-09-14 ENCOUNTER — Encounter: Payer: Self-pay | Admitting: *Deleted

## 2022-09-14 NOTE — Progress Notes (Signed)
[facility] PRE[facility] Weekly Session  Patient Details  Name: ALVIS PULCINI MRN: [id number] Date of Birth: 03-06-1946 Age: 77 y.o. PCP: Johnette Abraham, MD  Vitals:   09/14/22 1400  Weight: 162 lb 8 oz (73.7 kg)     [facility] Weekly seesion - 09/14/22 1600       [facility] "PREP" Location   [facility] "PREP" Location [facility]      Weekly Session   Topic Discussed Other   Portion size matters, tips for managing portion control, portion size demo, deceptive food label review.   Minutes exercised this week 30 minutes    Classes attended to date [place], [place] 09/14/2022, 8:44 PM

## 2022-09-21 ENCOUNTER — Encounter: Payer: Self-pay | Admitting: *Deleted

## 2022-09-21 ENCOUNTER — Encounter: Payer: Self-pay | Admitting: Internal Medicine

## 2022-09-21 ENCOUNTER — Ambulatory Visit (INDEPENDENT_AMBULATORY_CARE_PROVIDER_SITE_OTHER): Payer: Medicare PPO | Admitting: Internal Medicine

## 2022-09-21 VITALS — BP 156/77 | Ht 60.0 in | Wt 165.6 lb

## 2022-09-21 DIAGNOSIS — I10 Essential (primary) hypertension: Secondary | ICD-10-CM

## 2022-09-21 DIAGNOSIS — R42 Dizziness and giddiness: Secondary | ICD-10-CM | POA: Diagnosis not present

## 2022-09-21 DIAGNOSIS — M1811 Unilateral primary osteoarthritis of first carpometacarpal joint, right hand: Secondary | ICD-10-CM

## 2022-09-21 NOTE — Progress Notes (Signed)
[facility] PREP Weekly Session  Patient Details  Name: Nicole Horn MRN: [id number] Date of Birth: 1945/10/19 Age: 77 y.o. PCP: Johnette Abraham, MD  Vitals:   09/21/22 1400  Weight: 166 lb (75.3 kg)     [facility] Weekly seesion - 09/21/22 1600       [facility] "PREP" Location   [facility] "PREP" Location [facility]      Weekly Session   Topic Discussed Finding support   Staying positive, maintaining internal accountability, external support and accountability partners.   Minutes exercised this week 25 minutes    Classes attended to date [place], [place] 09/21/2022, 8:42 PM

## 2022-09-21 NOTE — Progress Notes (Signed)
Acute Office Visit  Subjective:     Patient ID: Nicole Horn, female    DOB: 1946-05-10, 77 y.o.   MRN: [id number]  Chief Complaint  Patient presents with   Dizziness    Patient is having dizzy, light headedness. She also states she's having numbness and pain in both hands.   Nicole Horn presents today for an acute visit to discuss bilateral hand pain and chronic dizziness.  She states that over last 2-3 weeks she has experienced pain diffusely across both hands. Pain is worse in her right thumb.  Of note, she has previously been evaluated by orthopedic surgery ([name]) and diagnosed with arthritis of the [facility] joint of the right thumb.  She was referred to hand surgery but did not move forward with any surgical intervention.  She has been managing her pain with Tylenol.  She describes a stiffness in her hands.  She specifically endorses pain along her left thumb and across the PIP joints of both hands.  Her dizziness today is not worse but has also not improved.  She states that her symptoms are worse after taking her medications.  Dizziness does not seem to be associated with sudden changes in position or head movements.  Review of Systems  Musculoskeletal:  Positive for joint pain (Bilateral hand pain, PIPs and CMC's of both hands).      Objective:    BP (!) 156/77   Ht 5' (1.524 m)   Wt 165 lb 9.6 oz (75.1 kg)   SpO2 91%   BMI 32.34 kg/m   Physical Exam Vitals reviewed.  Constitutional:      Appearance: Normal appearance.  HENT:     Head: Normocephalic and atraumatic.     Right Ear: External ear normal.     Left Ear: External ear normal.     Nose: Nose normal. No congestion or rhinorrhea.     Mouth/Throat:     Mouth: Mucous membranes are dry.     Pharynx: Oropharynx is clear. No oropharyngeal exudate or posterior oropharyngeal erythema.  Eyes:     Extraocular Movements: Extraocular movements intact.     Conjunctiva/sclera: Conjunctivae normal.      Pupils: Pupils are equal, round, and reactive to light.  Cardiovascular:     Rate and Rhythm: Normal rate and regular rhythm.     Pulses: Normal pulses.     Heart sounds: Normal heart sounds. No murmur heard.    No friction rub. No gallop.  Pulmonary:     Effort: Pulmonary effort is normal.     Breath sounds: Normal breath sounds. No wheezing, rhonchi or rales.  Abdominal:     General: Abdomen is flat. Bowel sounds are normal. There is no distension.     Palpations: Abdomen is soft.     Tenderness: There is no abdominal tenderness.  Musculoskeletal:        General: Tenderness (R CMC joint) present.     Cervical back: Normal range of motion.  Skin:    General: Skin is warm and dry.     Capillary Refill: Capillary refill takes less than 2 seconds.  Neurological:     General: No focal deficit present.     Mental Status: She is alert and oriented to person, place, and time.     Sensory: No sensory deficit.     Motor: No weakness.       Assessment & Plan:   Problem List Items Addressed This Visit  Essential hypertension    BP elevated today, 156/77.  She is currently prescribed amlodipine 10 mg daily, metoprolol succinate 12.5 mg daily, and spironolactone 25 mg daily.  Orthostatics negative today.  She states that her dizziness is worse after taking antihypertensive medications. -No medication changes today. I have asked that she take all of her medications prior to her next appointment. -Follow-up in 2 weeks for HTN check      Primary osteoarthritis of first carpometacarpal joint of right hand - Primary    Presenting today for evaluation of bilateral hand pain.  Pain is worst in her right thumb.  She has a prior history of osteoarthritis in the [facility] joint of the first digit of the right hand.  She was previously referred to hand surgery but did not wish to move forward with any type of surgical intervention.  She now endorses pain in her left hand.  No acute abnormalities  identified on exam. -I have recommended use Voltaren gel for pain relief.  I have also recommended that she return to care with orthopedic surgery to discuss additional treatment options      Dizziness    Chronic issue.  Symptoms have not worsened but also have not improved.  Symptoms seem to be worse after taking her antihypertensive medications.  Not triggered by sudden changes in position or head movements.  Orthostatics negative today, however she has also not taken any of her antihypertensive medications. -Follow-up in 2 weeks for reassessment      Return in about 2 weeks (around 10/05/2022) for HTN, hand pain, dizziness.  Johnette Abraham, MD

## 2022-09-21 NOTE — Patient Instructions (Signed)
It was a pleasure to see you today.  Thank you for giving [place] the opportunity to be involved in your care.  Below is a brief recap of your visit and next steps.  We will plan to see you again in 2 weeks.  Summary I recommend using Voltaren gel for relief of hand pain Follow up in 2 weeks to check your blood pressure. Please be sure you have taken your medications.

## 2022-09-27 NOTE — Assessment & Plan Note (Signed)
Chronic issue.  Symptoms have not worsened but also have not improved.  Symptoms seem to be worse after taking her antihypertensive medications.  Not triggered by sudden changes in position or head movements.  Orthostatics negative today, however she has also not taken any of her antihypertensive medications. -Follow-up in 2 weeks for reassessment

## 2022-09-27 NOTE — Assessment & Plan Note (Signed)
Presenting today for evaluation of bilateral hand pain.  Pain is worst in her right thumb.  She has a prior history of osteoarthritis in the [facility] joint of the first digit of the right hand.  She was previously referred to hand surgery but did not wish to move forward with any type of surgical intervention.  She now endorses pain in her left hand.  No acute abnormalities identified on exam. -I have recommended use Voltaren gel for pain relief.  I have also recommended that she return to care with orthopedic surgery to discuss additional treatment options

## 2022-09-27 NOTE — Assessment & Plan Note (Addendum)
BP elevated today, 156/77.  She is currently prescribed amlodipine 10 mg daily, metoprolol succinate 12.5 mg daily, and spironolactone 25 mg daily.  Orthostatics negative today.  She states that her dizziness is worse after taking antihypertensive medications. -No medication changes today. I have asked that she take all of her medications prior to her next appointment. -Follow-up in 2 weeks for HTN check

## 2022-09-28 ENCOUNTER — Encounter: Payer: Self-pay | Admitting: *Deleted

## 2022-09-28 NOTE — Progress Notes (Signed)
[facility] PREP Weekly Session  Patient Details  Name: LAWAN NANEZ MRN: [id number] Date of Birth: April 21, 1946 Age: 77 y.o. PCP: Johnette Abraham, MD  Vitals:   09/28/22 1400  Weight: 165 lb (74.8 kg)     [facility] Weekly seesion - 09/28/22 1600       [facility] "PREP" Location   [facility] "PREP" Location [facility]      Weekly Session   Topic Discussed Calorie breakdown   Macronutrient review, importance of protein for building muscle. [facility] membership talk by Ronie Spies. Discuss preparation of goals and activities sheet for final assessment.   Minutes exercised this week 140 minutes    Classes attended to date [place], [place] 09/28/2022, 9:58 PM

## 2022-09-29 ENCOUNTER — Encounter: Payer: Medicare PPO | Admitting: Orthopedic Surgery

## 2022-09-29 ENCOUNTER — Telehealth: Payer: Self-pay | Admitting: Radiology

## 2022-09-29 NOTE — Telephone Encounter (Signed)
-----   Message from Carole Civil, MD sent at 09/29/2022  8:23 AM EST ----- Regarding: RE: please clarify I need it forbpre charting ----- Message ----- From: Candice Camp, RT Sent: 09/29/2022   8:07 AM EST To: Carole Civil, MD; Brand Males, RT; # Subject: RE: please clarify                             I saw that I added printed when I printed med sheet. Didn't think it would be a big deal. We know the patient, I can ask her where she is hurting when I put her in the room.  ----- Message ----- From: May, Wendy, RT Sent: 09/28/2022   7:25 PM EST To: Carole Civil, MD; Elissa Hefty; # Subject: please clarify                                 [place] and [place] you made appt, I think it is a typo, you inadvertently missed saying what body part, [place] Maiolo not sure if you knew anything on this as appt hx shows you changed appt/notes.  You two please let Dr Lemmie Evens know?  Thanks!  ----- Message ----- From: Carole Civil, MD Sent: 09/28/2022   5:53 PM EST To: Brand Males, RT; Elissa Hefty; Bo Mcclintock, CMA; #  Can anyone explain BIL PAIN?????

## 2022-09-29 NOTE — Telephone Encounter (Signed)
I called patient its both hands

## 2022-10-05 ENCOUNTER — Encounter: Payer: Self-pay | Admitting: *Deleted

## 2022-10-05 NOTE — Progress Notes (Signed)
[facility] PREP Weekly Session  Patient Details  Name: Nicole Horn MRN: [id number] Date of Birth: 1945-11-09 Age: 77 y.o. PCP: Johnette Abraham, MD  Vitals:   10/05/22 1400  Weight: 165 lb (74.8 kg)     [facility] Weekly seesion - 10/05/22 1600       [facility] "PREP" Location   [facility]      Weekly Session   Topic Discussed Hitting roadblocks   Review major course topics. Discuss barriers and how to stay on track. Assign "Goals and Activity Sheet" for end of program final assessment meeting   Minutes exercised this week 150 minutes    Classes attended to date [place], [place] 10/05/2022, 8:58 PM

## 2022-10-06 ENCOUNTER — Ambulatory Visit (INDEPENDENT_AMBULATORY_CARE_PROVIDER_SITE_OTHER): Payer: Medicare PPO | Admitting: Internal Medicine

## 2022-10-06 ENCOUNTER — Encounter: Payer: Self-pay | Admitting: Internal Medicine

## 2022-10-06 VITALS — BP 137/77 | HR 93 | Ht 60.0 in | Wt 162.4 lb

## 2022-10-06 DIAGNOSIS — I10 Essential (primary) hypertension: Secondary | ICD-10-CM

## 2022-10-06 DIAGNOSIS — R002 Palpitations: Secondary | ICD-10-CM | POA: Diagnosis not present

## 2022-10-06 DIAGNOSIS — M545 Low back pain, unspecified: Secondary | ICD-10-CM

## 2022-10-06 DIAGNOSIS — R42 Dizziness and giddiness: Secondary | ICD-10-CM

## 2022-10-06 DIAGNOSIS — M1811 Unilateral primary osteoarthritis of first carpometacarpal joint, right hand: Secondary | ICD-10-CM | POA: Diagnosis not present

## 2022-10-06 DIAGNOSIS — L732 Hidradenitis suppurativa: Secondary | ICD-10-CM

## 2022-10-06 DIAGNOSIS — R079 Chest pain, unspecified: Secondary | ICD-10-CM | POA: Insufficient documentation

## 2022-10-06 DIAGNOSIS — R072 Precordial pain: Secondary | ICD-10-CM

## 2022-10-06 MED ORDER — CLINDAMYCIN PHOSPHATE 1 % EX GEL: Freq: Two times a day (BID) | CUTANEOUS | 0 refills | Status: DC

## 2022-10-06 MED ORDER — CYCLOBENZAPRINE HCL 5 MG PO TABS: 5.0000 mg | ORAL_TABLET | Freq: Every evening | ORAL | 0 refills | Status: DC | PRN

## 2022-10-06 NOTE — Assessment & Plan Note (Signed)
Multiple raised, tender lesions noted in the right axilla on exam today. -I have prescribed Clindagel for twice daily application -Reassess at follow-up on 3/4

## 2022-10-06 NOTE — Patient Instructions (Addendum)
It was a pleasure to see you today.  Thank you for giving [place] the opportunity to be involved in your care.  Below is a brief recap of your visit and next steps.  We will plan to see you again in March.  Summary I have prescribed flexeril for relief of back pain ECG ordered today If your back pain improves but you continue to have chest pain, you need to contact your cardiologist Follow up in early March Try clindagel twice daily for lumps under your right armpit

## 2022-10-06 NOTE — Assessment & Plan Note (Signed)
Chronic issue that she states has improved over the last month.  She still experiences episodes of dizziness but says that they are less intense. -No medication changes today

## 2022-10-06 NOTE — Assessment & Plan Note (Addendum)
She endorses recent chest pain that begins in her back and radiates to her chest.  She describes a chest tightness.  Her past medical history is remarkable for CAD with MI in 2004.  Pain is reproducible on palpation of the chest wall and does not radiate into her neck, jaw, shoulders, or arms.  Her exam today suggests a musculoskeletal etiology vs cardiac. Denied chest pain at the time of our encounter. -ECG ordered today.  No acute ischemic changes identified.  No significant changes compared to most recent ECG from May 2023. -Patient was instructed to contact her cardiologist for follow-up if chest pain persists despite resolution of back pain

## 2022-10-06 NOTE — Progress Notes (Signed)
Established Patient Office Visit  Subjective   Patient ID: Nicole Horn, female    DOB: 1946/07/21  Age: 77 y.o. MRN: [id number]  Chief Complaint  Patient presents with   Hypertension    Follow up   Nicole Horn returns to care today for 2-week follow-up.  She was last seen by me on 1/30 for an acute visit to discuss symptoms of dizziness.  Orthostatics were negative at that time.  Her blood pressure was significantly elevated but she has not taken any of her antihypertensive medications.  She also endorsed bilateral hand pain.  Recommended use of Voltaren gel for pain relief and that she schedule follow-up with orthopedic surgery.  In the interim she scheduled an appointment with orthopedic surgery for 2/7, but did not present for this visit because her hand pain has improved with use of Voltaren gel and wearing wrist braces bilaterally.  There have otherwise been no acute interval events.  Today Nicole Horn states that her hand pain has significantly improved since wearing wrist braces and using Voltaren gel.  This is why she canceled her appointment with [name].  Her blood pressure is acceptable today after taking her antihypertensive medications.  She also states that her dizziness seems to have improved.  She still experiences dizziness occasionally, but it is much less intense.  Her acute concern today is lumbar back pain that radiates to her chest.  She reports onset of back pain 3 days ago.  Pain is worse with movements at the waist including flexion/extension and twisting motions.  Pain does not radiate into her lower extremities.  She denies saddle anesthesia and bowel/bladder incontinence.  She is concerned that she has strained a muscle while exercising at the gym. Nicole Horn reports that the pain radiates to her chest, which she describes as a tightness.  Of note, her past medical history is significant for CAD and she reports an MI in 2004.  She  states that her chest pain is reproducible on palpation.  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   Type 2 diabetes mellitus ([place])    Past Surgical History:  Procedure Laterality Date   ABDOMINAL HYSTERECTOMY     BACK SURGERY     cervical fuision   BARTHOLIN GLAND CYST EXCISION Left 03/04/2015   Procedure: EXCISION OF LEFT BARTHOLINS TUMOR;  Surgeon: [name], MD;  Location: [facility];  Service: Gynecology;  Laterality: Left;   CATARACT EXTRACTION W/PHACO Right 03/27/2021   Procedure: CATARACT EXTRACTION PHACO AND INTRAOCULAR LENS PLACEMENT (IOC);  Surgeon: Baruch Goldmann, MD;  Location: [facility];  Service: Ophthalmology;  Laterality: Right;  cde 8.45   CATARACT EXTRACTION W/PHACO Left 11/06/2021   Procedure: CATARACT EXTRACTION PHACO AND INTRAOCULAR LENS PLACEMENT (IOC);  Surgeon: Baruch Goldmann, MD;  Location: [facility];  Service: Ophthalmology;  Laterality: Left;  CDE:13.69   CERVICAL SPINE SURGERY  COLONOSCOPY N/A 02/01/2020   Procedure: COLONOSCOPY;  Surgeon: Daneil Dolin, MD; Scattered medium mouth diverticula in the sigmoid and descending colon, otherwise normal exam.   CYST REMOVAL TRUNK     ESOPHAGOGASTRODUODENOSCOPY (EGD) WITH PROPOFOL N/A 03/27/2020   Surgeon: Daneil Dolin, MD;  normal esophagus and stomach, 2 small duodenal erosions.   GIVENS CAPSULE STUDY N/A 03/27/2020   Surgeon: Daneil Dolin, MD;  multiple small bowel angiectasia's    Multiple cyst removal surgeries     Stent of lower extremities     Social History   Tobacco Use   Smoking status: Former    Packs/day: 2.00    Years: 25.00    Total pack years: 50.00    Types: Cigarettes    Quit date: 03/10/1985    Years since quitting: 37.6   Smokeless tobacco: Never  Vaping Use   Vaping Use: Never used  Substance Use Topics   Alcohol use: Not Currently    Comment: socially; rarely   Drug use: No   Family History  Problem Relation Age of Onset   Colon cancer Mother        In her 42s   Breast cancer Sister    Stomach cancer Maternal Aunt    Healthy Brother    Stroke Paternal Grandmother    Heart attack Paternal Grandmother    No Known Allergies  Review of Systems  Cardiovascular:  Positive for chest pain.  Musculoskeletal:  Positive for back pain (Lumbar).  Neurological:  Positive for dizziness (Chronic, less intense).  All other systems reviewed and are negative.    Objective:     BP 137/77   Pulse 93   Ht 5' (1.524 m)   Wt 162 lb 6.4 oz (73.7 kg)   SpO2 92%   BMI 31.72 kg/m  BP Readings from Last 3 Encounters:  10/06/22 137/77  09/27/22 (!) 156/77  08/12/22 120/80   Physical Exam Vitals reviewed.  Constitutional:      General: She is not in acute distress.    Appearance: Normal appearance. She is obese. She is not toxic-appearing.  HENT:     Head: Normocephalic and atraumatic.     Right Ear: External ear normal.     Left Ear: External ear normal.     Nose: Nose normal. No congestion or rhinorrhea.     Mouth/Throat:     Mouth: Mucous membranes are moist.     Pharynx: Oropharynx is clear. No oropharyngeal exudate or posterior oropharyngeal erythema.  Eyes:     General: No scleral icterus.    Extraocular Movements: Extraocular movements intact.     Conjunctiva/sclera: Conjunctivae normal.     Pupils: Pupils are equal, round, and reactive to light.  Cardiovascular:     Rate and Rhythm: Normal rate and regular rhythm.      Pulses: Normal pulses.     Heart sounds: Normal heart sounds. No murmur heard.    No friction rub. No gallop.  Pulmonary:     Effort: Pulmonary effort is normal.     Breath sounds: Normal breath sounds. No wheezing, rhonchi or rales.  Abdominal:     General: Abdomen is flat. Bowel sounds are normal. There is no distension.     Palpations: Abdomen is soft.     Tenderness: There is no abdominal tenderness.  Musculoskeletal:        General: Tenderness (TTP over the left paraspinal muscles of lumbar spine) present. No swelling.     Cervical back: Normal  range of motion.     Right lower leg: No edema.     Left lower leg: No edema.     Comments: ROM at the waist is limited secondary to pain  Lymphadenopathy:     Cervical: No cervical adenopathy.  Skin:    General: Skin is warm and dry.     Capillary Refill: Capillary refill takes less than 2 seconds.     Coloration: Skin is not jaundiced.     Findings: Lesion (Multiple tender, raised lesions in the right armpit) present.  Neurological:     General: No focal deficit present.     Mental Status: She is alert and oriented to person, place, and time.  Psychiatric:        Mood and Affect: Mood normal.        Behavior: Behavior normal.   Last CBC Lab Results  Component Value Date   WBC 3.3 (L) 08/31/2022   HGB 14.4 08/31/2022   HCT 47.0 (H) 08/31/2022   MCV 84.8 08/31/2022   MCH 26.0 08/31/2022   RDW 14.6 08/31/2022   PLT 252 0000000   Last metabolic panel Lab Results  Component Value Date   GLUCOSE 110 (H) 08/31/2022   NA 140 08/31/2022   K 3.9 08/31/2022   CL 105 08/31/2022   CO2 29 08/31/2022   BUN 16 08/31/2022   CREATININE 1.07 (H) 08/31/2022   GFRNONAA 54 (L) 08/31/2022   CALCIUM 9.7 08/31/2022   PROT 7.5 08/31/2022   ALBUMIN 4.0 08/31/2022   LABGLOB 2.6 05/13/2021   AGRATIO 1.7 05/13/2021   BILITOT 0.4 08/31/2022   ALKPHOS 93 08/31/2022   AST 24 08/31/2022   ALT 29 08/31/2022   ANIONGAP 6 08/31/2022   Last  lipids Lab Results  Component Value Date   CHOL 115 08/10/2021   HDL 47 (L) 08/10/2021   LDLCALC 50 08/10/2021   TRIG 99 08/10/2021   CHOLHDL 2.4 08/10/2021   Last hemoglobin A1c Lab Results  Component Value Date   HGBA1C 7.2 (H) 07/23/2022   Last vitamin D Lab Results  Component Value Date   VD25OH 34.12 04/24/2020   Last vitamin B12 and Folate Lab Results  Component Value Date   VITAMINB12 328 06/06/2020   FOLATE 32.2 06/06/2020     Assessment & Plan:   Problem List Items Addressed This Visit       Essential hypertension    BP was elevated at her last appointment.  She had not taken any of her antihypertensive medications.  BP acceptable today, 137/77. -No medication changes today      Primary osteoarthritis of first carpometacarpal joint of right hand    Bilateral hand pain has significantly improved since her last appointment.  She has been using Voltaren gel and wearing wrist braces nightly. -No changes today      Hidradenitis suppurativa of right axilla    Multiple raised, tender lesions noted in the right axilla on exam today. -I have prescribed Clindagel for twice daily application -Reassess at follow-up on 3/4      Dizziness    Chronic issue that she states has improved over the last month.  She still experiences episodes of dizziness but says that they are less intense. -No medication changes today      Acute lumbar back pain - Primary    Her acute concern today is lumbar back pain has been present for 3 days.  Pain is worse with movement at the waist.  There is tenderness palpation  of the left paraspinal muscle.  No red flag symptoms present.  Her history and exam findings today seem most consistent with a lumbar strain. -I have recommended continued use of Tylenol and Voltaren gel for pain relief.  I have also prescribed Flexeril 5 mg nightly as needed      Chest pain    She endorses recent chest pain that begins in her back and radiates to her  chest.  She describes a chest tightness.  Her past medical history is remarkable for CAD with MI in 2004.  Pain is reproducible on palpation of the chest wall and does not radiate into her neck, jaw, shoulders, or arms.  Her exam today suggests a musculoskeletal etiology vs cardiac. Denied chest pain at the time of our encounter. -ECG ordered today.  No acute ischemic changes identified.  No significant changes compared to most recent ECG from May 2023. -Patient was instructed to contact her cardiologist for follow-up if chest pain persists despite resolution of back pain       Return if symptoms worsen or fail to improve.    Johnette Abraham, MD

## 2022-10-06 NOTE — Assessment & Plan Note (Signed)
BP was elevated at her last appointment.  She had not taken any of her antihypertensive medications.  BP acceptable today, 137/77. -No medication changes today

## 2022-10-06 NOTE — Assessment & Plan Note (Signed)
Her acute concern today is lumbar back pain has been present for 3 days.  Pain is worse with movement at the waist.  There is tenderness palpation of the left paraspinal muscle.  No red flag symptoms present.  Her history and exam findings today seem most consistent with a lumbar strain. -I have recommended continued use of Tylenol and Voltaren gel for pain relief.  I have also prescribed Flexeril 5 mg nightly as needed

## 2022-10-06 NOTE — Assessment & Plan Note (Signed)
Bilateral hand pain has significantly improved since her last appointment.  She has been using Voltaren gel and wearing wrist braces nightly. -No changes today

## 2022-10-06 NOTE — Addendum Note (Signed)
Addended by: Johnette Abraham on: 10/06/2022 05:42 PM   Modules accepted: Level of Service

## 2022-10-12 ENCOUNTER — Encounter: Payer: Self-pay | Admitting: *Deleted

## 2022-10-12 NOTE — Progress Notes (Signed)
[facility] PREP Weekly Session  Patient Details  Name: Nicole Horn MRN: [id number] Date of Birth: [birth year] Age: 77 y.o. PCP: Johnette Abraham, MD  Vitals:   10/12/22 1600  Weight: 162 lb (73.5 kg)     [facility] Weekly seesion - 10/12/22 1600       [facility] "PREP" Location   [facility] "PREP" Location [facility]      Weekly Session   Topic Discussed Other   Fit and Strong Survey, Final FIT Testing   Minutes exercised this week 50 minutes    Classes attended to date 42             Norris Cross 10/12/2022, 9:47 PM

## 2022-10-14 ENCOUNTER — Encounter: Payer: Self-pay | Admitting: *Deleted

## 2022-10-14 NOTE — Progress Notes (Signed)
[facility] PREP Evaluation  Patient Details  Name: Nicole Horn MRN: [id number] Date of Birth: August 06, 1946 Age: 77 y.o. PCP: Johnette Abraham, MD  Vitals:   10/14/22 1430  BP: 132/62  Pulse: 75  Resp: 20  SpO2: 97%  Weight: 162 lb 6.4 oz (73.7 kg)  Height: 5' (1.524 m)     [facility] Eval - 10/14/22 1430       [facility] "PREP" Location   [facility] "PREP" Location [place]      Referral    Referring Provider Oval Linsey    Reason for referral Obesitity/Overweight;Hypertension;High Cholesterol    Program Start Date 07/20/22    Program End Date 10/14/22      Measurement   Waist Circumference 41.5 inches    Waist Circumference End Program 42 inches    Hip Circumference 40 inches    Hip Circumference End Program 39 inches    Body fat 44.8 percent      Mobility and Daily Activities   I find it easy to walk up or down two or more flights of stairs. 3    I have no trouble taking out the trash. 4    I do housework such as vacuuming and dusting on my own without difficulty. 2    I can easily lift a gallon of milk (8lbs). 4    I can easily walk a mile. 2    I have no trouble reaching into high cupboards or reaching down to pick up something from the floor. 4    I do not have trouble doing out-door work such as Armed forces logistics/support/administrative officer, raking leaves, or gardening. 2      Mobility and Daily Activities   I feel younger than my age. 4    I feel independent. 4    I feel energetic. 3    I live an active life.  3    I feel strong. 3    I feel healthy. 3    I feel active as other people my age. 4      How fit and strong are you.   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   Type 2 diabetes mellitus ([place])    Past Surgical History:  Procedure Laterality Date   ABDOMINAL HYSTERECTOMY     BACK SURGERY     cervical fuision   BARTHOLIN GLAND CYST EXCISION Left 03/04/2015   Procedure: EXCISION OF LEFT BARTHOLINS TUMOR;  Surgeon: [name], MD;  Location: [facility];  Service: Gynecology;  Laterality: Left;   CATARACT EXTRACTION W/PHACO Right 03/27/2021   Procedure: CATARACT EXTRACTION PHACO AND INTRAOCULAR LENS PLACEMENT (IOC);  Surgeon: Baruch Goldmann, MD;  Location: [facility];  Service: Ophthalmology;  Laterality: Right;  cde 8.45   CATARACT EXTRACTION W/PHACO Left 11/06/2021  Procedure: CATARACT EXTRACTION PHACO AND INTRAOCULAR LENS PLACEMENT (IOC);  Surgeon: Baruch Goldmann, MD;  Location: [facility];  Service: Ophthalmology;  Laterality: Left;  CDE:13.69   CERVICAL SPINE SURGERY     COLONOSCOPY N/A 02/01/2020   Procedure: COLONOSCOPY;  Surgeon: Daneil Dolin, MD; Scattered medium mouth diverticula in the sigmoid and descending colon, otherwise normal exam.   CYST REMOVAL TRUNK     ESOPHAGOGASTRODUODENOSCOPY (EGD) WITH PROPOFOL N/A 03/27/2020   Surgeon: Daneil Dolin, MD;  normal esophagus and stomach, 2 small duodenal erosions.   GIVENS CAPSULE STUDY N/A 03/27/2020   Surgeon: Daneil Dolin, MD;  multiple small bowel angiectasia's   Multiple cyst removal surgeries     Stent of lower extremities     Social History   Tobacco Use  Smoking Status Former   Packs/day: 2.00   Years: 25.00   Total pack years: 50.00   Types: Cigarettes   Quit date: 03/10/1985   Years since quitting: 37.6  Smokeless Tobacco Never    Nicole Horn 10/14/2022, 10:20 PM  PREP Complete. Attended 10 of 11  educational classes and 7 of 11 exercise sessions. Nicole Horn was very engaged in class. She states she continues to work on decreasing sugars in her diet and eating out less often. She reports increase in her stamina and has enjoyed adding exercise to her routine.

## 2022-10-21 ENCOUNTER — Encounter: Payer: Self-pay | Admitting: Radiology

## 2022-10-25 ENCOUNTER — Ambulatory Visit (INDEPENDENT_AMBULATORY_CARE_PROVIDER_SITE_OTHER): Payer: Medicare PPO

## 2022-10-25 ENCOUNTER — Ambulatory Visit (INDEPENDENT_AMBULATORY_CARE_PROVIDER_SITE_OTHER): Payer: Medicare PPO | Admitting: Internal Medicine

## 2022-10-25 ENCOUNTER — Encounter: Payer: Self-pay | Admitting: Internal Medicine

## 2022-10-25 VITALS — BP 140/82 | HR 83 | Ht 60.0 in | Wt 163.0 lb

## 2022-10-25 DIAGNOSIS — E1122 Type 2 diabetes mellitus with diabetic chronic kidney disease: Secondary | ICD-10-CM

## 2022-10-25 DIAGNOSIS — Z0000 Encounter for general adult medical examination without abnormal findings: Secondary | ICD-10-CM | POA: Diagnosis not present

## 2022-10-25 DIAGNOSIS — N182 Chronic kidney disease, stage 2 (mild): Secondary | ICD-10-CM | POA: Diagnosis not present

## 2022-10-25 DIAGNOSIS — M545 Low back pain, unspecified: Secondary | ICD-10-CM | POA: Diagnosis not present

## 2022-10-25 MED ORDER — EMPAGLIFLOZIN 10 MG PO TABS: 10.0000 mg | ORAL_TABLET | Freq: Every day | ORAL | 2 refills | Status: DC

## 2022-10-25 NOTE — Patient Instructions (Signed)
It was a pleasure to see you today.  Thank you for giving [place] the opportunity to be involved in your care.  Below is a brief recap of your visit and next steps.  We will plan to see you again in 3 months.  Summary Start Jardiance 10 mg daily for diabetes and kidney disease We will repeat your A1c and check your urine for protein Follow up in 3 months Please see the attached exercises for your back

## 2022-10-25 NOTE — Assessment & Plan Note (Signed)
Previously evaluated by me on 2/14 endorsing a 3-day history of acute lumbar back pain.  No inciting event or trauma was noted at the onset of pain.  No red flag symptoms identified.  Her pain has not improved with use of Tylenol/Voltaren gel and Flexeril nightly as needed.  Her exam is largely unchanged today.  There is tenderness palpation of the left paraspinal muscles of the lumbar spine. -Treatment options reviewed.  She will attempt home PT over the next several weeks.  If there is no significant improvement in her pain, consider referral for formal physical therapy vs orthopedic surgery evaluation.

## 2022-10-25 NOTE — Progress Notes (Signed)
Subjective:   Nicole Horn is a 77 y.o. female who presents for Medicare Annual (Subsequent) preventive examination.  Review of Systems           Objective:    There were no vitals filed for this visit. There is no height or weight on file to calculate BMI.     05/13/2022    1:57 PM 04/20/2022    2:44 PM 01/07/2022    3:56 PM 12/30/2021    1:29 PM 12/09/2021   11:57 AM 11/05/2021    8:56 AM 10/27/2021    3:30 PM  Advanced Directives  Does Patient Have a Medical Advance Directive? No No No No No Yes Yes  Type of Academic librarian;Living will     [place];Living will Living will;Healthcare Power of Attorney  Does patient want to make changes to medical advance directive? No - Patient declined  No - Patient declined No - Patient declined  No - Patient declined No - Patient declined  Copy of [place] in Chart? No - copy requested   No - copy requested  No - copy requested No - copy requested  Would patient like information on creating a medical advance directive? No - Patient declined No - Patient declined No - Patient declined No - Patient declined No - Patient declined      Current Medications (verified) Outpatient Encounter Medications as of 10/25/2022  Medication Sig   albuterol (VENTOLIN HFA) 108 (90 Base) MCG/ACT inhaler Inhale 1-2 puffs into the lungs every 6 (six) hours as needed for wheezing or shortness of breath.   amLODipine (NORVASC) 10 MG tablet Take 1 tablet (10 mg total) by mouth daily.   aspirin EC 81 MG tablet Take 81 mg by mouth in the morning. Swallow whole.   benzonatate (TESSALON) 100 MG capsule Take 1 capsule (100 mg total) by mouth 3 (three) times daily as needed for cough. Do not take with alcohol or while driving or operating heavy machinery.  May cause drowsiness.   Blood Glucose Calibration (TRUE METRIX LEVEL 1) Low SOLN 1 application by In Vitro route daily Max Daily Amount: 1  application   Blood Glucose Monitoring Suppl (TRUE METRIX GO GLUCOSE METER) w/Device KIT by Does not apply route.   Calcium Carbonate Antacid (CALCIUM CARBONATE PO) Take 1,200 mg of elemental calcium by mouth in the morning.   cholecalciferol (VITAMIN D3) 25 MCG (1000 UNIT) tablet Take 1,000 Units by mouth daily.   clindamycin (CLINDAGEL) 1 % gel Apply topically 2 (two) times daily.   clopidogrel (PLAVIX) 75 MG tablet TAKE 1 TABLET BY MOUTH  DAILY   cyclobenzaprine (FLEXERIL) 5 MG tablet Take 1 tablet (5 mg total) by mouth at bedtime as needed for muscle spasms.   Ferrous Sulfate (IRON PO) Take by mouth daily.   ferrous sulfate 325 (65 FE) MG EC tablet Take 1 tablet by mouth every morning.   FLUoxetine (PROZAC) 10 MG capsule TAKE 1 CAPSULE BY MOUTH EVERY DAY   metoprolol succinate (TOPROL XL) 25 MG 24 hr tablet Take 0.5 tablets (12.5 mg total) by mouth daily.   Multiple Vitamin (MULTIVITAMIN WITH MINERALS) TABS tablet Take 1 tablet by mouth in the morning. Centrum   nortriptyline (PAMELOR) 10 MG capsule TAKE 1 CAPSULE BY MOUTH EVERYDAY AT BEDTIME   Probiotic Product (DIGESTIVE ADVANTAGE) CAPS Take 1 capsule by mouth daily.   rosuvastatin (CRESTOR) 40 MG tablet Take 1 tablet (40 mg total) by  mouth daily.   spironolactone (ALDACTONE) 25 MG tablet Take 1 tablet (25 mg total) by mouth daily.   valACYclovir (VALTREX) 500 MG tablet TAKE 1 TABLET (500 MG TOTAL) BY MOUTH DAILY.   vitamin C (ASCORBIC ACID) 500 MG tablet Take 500 mg by mouth 2 (two) times a week.   No facility-administered encounter medications on file as of 10/25/2022.    Allergies (verified) Patient has no known allergies.   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   Type 2 diabetes mellitus ([place])    Past Surgical History:  Procedure Laterality Date   ABDOMINAL HYSTERECTOMY     BACK SURGERY     cervical fuision   BARTHOLIN GLAND CYST EXCISION Left 03/04/2015   Procedure: EXCISION OF LEFT BARTHOLINS TUMOR;  Surgeon: [name], MD;  Location: [facility];  Service: Gynecology;  Laterality: Left;   CATARACT EXTRACTION W/PHACO Right 03/27/2021   Procedure: CATARACT EXTRACTION PHACO AND INTRAOCULAR LENS PLACEMENT (IOC);  Surgeon: Baruch Goldmann, MD;  Location: [facility];  Service: Ophthalmology;  Laterality: Right;  cde 8.45   CATARACT EXTRACTION W/PHACO Left 11/06/2021   Procedure: CATARACT EXTRACTION PHACO AND INTRAOCULAR LENS PLACEMENT (IOC);  Surgeon: Baruch Goldmann, MD;  Location: [facility];  Service: Ophthalmology;  Laterality: Left;  CDE:13.69   CERVICAL SPINE SURGERY     COLONOSCOPY N/A 02/01/2020   Procedure: COLONOSCOPY;  Surgeon: Daneil Dolin, MD; Scattered medium mouth diverticula in the sigmoid and descending colon, otherwise normal exam.   CYST REMOVAL TRUNK     ESOPHAGOGASTRODUODENOSCOPY (EGD) WITH PROPOFOL N/A 03/27/2020   Surgeon: Daneil Dolin, MD;  normal esophagus and stomach, 2 small duodenal erosions.   GIVENS CAPSULE STUDY N/A 03/27/2020   Surgeon: Daneil Dolin, MD;  multiple small bowel angiectasia's   Multiple cyst removal surgeries     Stent of lower extremities     Family History  Problem Relation Age of Onset   Colon cancer Mother        In her 66s   Breast cancer Sister    Stomach cancer Maternal Aunt    Healthy Brother    Stroke Paternal Grandmother    Heart attack Paternal Grandmother    Social History   Socioeconomic History    Marital status: Widowed    Spouse name: Not on file   Number of children: 1   Years of education: Not on file   Highest education level: Some college, no degree  Occupational History   Not on file  Tobacco Use   Smoking status: Former    Packs/day: 2.00    Years: 25.00    Total pack years: 50.00    Types: Cigarettes    Quit date: 03/10/1985    Years since quitting: 37.6   Smokeless tobacco: Never  Vaping Use   Vaping Use: Never used  Substance and Sexual Activity   Alcohol  use: Not Currently    Comment: socially; rarely   Drug use: No   Sexual activity: Yes    Birth control/protection: Surgical  Other Topics Concern   Not on file  Social History Narrative   Lives in [place] and [place] 6 months here and there owns an apt in [place] alone, but helps to care for mother who is 36 years (goes between the kids)   Sister is in [place] is in [place]      Son who is 63 years old lives in [place], [place]    Has 6 grand kids and 7 great grands      Enjoys exercise (harder since COVID)-dancing      Diet: eats all foods-enjoys smoothies, avoids fried foods, limited red meat if any   Caffeine: coffee 1 cup in the morning-sometimes will have 1 in evening    Water: 3-4 bottles daily       Wears seat belt   Smoke detectors in home   Does not use phone while driving            Social Determinants of Health   Financial Resource Strain: Medium Risk (12/25/2021)   Overall Financial Resource Strain (CARDIA)    Difficulty of Paying Living Expenses: Somewhat hard  Food Insecurity: No Food Insecurity (10/20/2021)   Hunger Vital Sign    Worried About Running Out of Food in the Last Year: Never true    [place] in the Last Year: Never true  Transportation Needs: No Transportation Needs (10/20/2021)   PRAPARE - Transportation    Lack of Transportation (Medical): No    Lack of Transportation (Non-Medical): No  Physical Activity: Insufficiently Active  (10/20/2021)   Exercise Vital Sign    Days of Exercise per Week: 4 days    Minutes of Exercise per Session: 20 min  Stress: Stress Concern Present (04/23/2022)   [place]    Feeling of Stress : To some extent  Social Connections: Unknown (10/20/2021)   Social Connection and Isolation Panel [NHANES]    Frequency of Communication with Friends and Family: Three times a week    Frequency of Social Gatherings with Friends and Family: Three times a week    Attends Religious Services: More than 4 times per year    Active Member of Clubs or Organizations: Yes    Attends Archivist Meetings: 1 to 4 times per year    Marital Status: Not on file    Tobacco Counseling Counseling given: Not Answered   Clinical Intake:    Diabetic?yes Nutrition Risk Assessment:  Has the patient had any N/V/D within the last 2 months?  Yes  Does the patient have any non-healing wounds?  No  Has the patient had any unintentional weight loss or weight gain?  No   Diabetes:  Is the patient diabetic?  Yes  If diabetic, was a CBG obtained today?  No  Did the patient bring in their glucometer from home?  No  How often do you monitor your CBG's? 3 times per week.   Financial Strains and Diabetes Management:  Are you having any financial strains with the device, your supplies or your medication? No .  Does the patient want to be seen by Chronic Care Management for management of their diabetes?  No  Would the patient like to be referred to a Nutritionist or for Diabetic Management?  No  Diabetic Exams:  Diabetic Eye Exam: Completed 07/05/22 Diabetic Foot Exam: Completed 07/23/22           Activities of Daily Living    11/05/2021    9:00 AM 11/05/2021    8:56 AM  In your present state of health, do you have any difficulty performing the following activities:  Hearing?  0  Vision?  1  Difficulty concentrating or making  decisions?  0  Walking or climbing stairs?  0  Dressing or bathing?  0  Doing errands, shopping? 0     Patient Care Team: Johnette Abraham, MD as PCP - General (Internal Medicine) Satira Sark, MD as PCP - Cardiology (Cardiology) Gala Romney Cristopher Estimable, MD as Consulting Physician (Gastroenterology) Shea Evans Norva Riffle, LCSW as [place] (Licensed Clinical Social Worker)  Indicate any recent [place] you may have received from other than [facility] providers in the past year (date may be approximate).     Assessment:   This is a routine wellness examination for Nicole Horn.  Hearing/Vision screen No results found.  Dietary issues and exercise activities discussed:     Goals Addressed   None   Depression Screen    10/06/2022    4:31 PM 09/21/2022    9:18 AM 07/23/2022    1:24 PM 04/23/2022    2:48 PM 03/11/2022    4:30 PM 02/04/2022    3:05 PM 01/21/2022    1:16 PM  PHQ 2/9 Scores  PHQ - 2 Score 0 '3 1 2 2 2 '$ 0  PHQ- 9 Score '1 5  7 6 7 '$ 0    Fall Risk    10/06/2022    4:31 PM 09/21/2022    9:18 AM 07/23/2022    1:24 PM 01/21/2022    1:16 PM 10/20/2021   11:50 AM  Fall Risk   Falls in the past year? 1 1 0 1 1  Comment     08/2021  Number falls in past yr: 0 1 0 0 1  Injury with Fall? 0 0 0 0 0  Risk for fall due to : No Fall Risks  No Fall Risks History of fall(s) History of fall(s);Impaired balance/gait  Follow up Falls evaluation completed  Falls evaluation completed Falls evaluation completed Education provided    [place]:  Any stairs in or around the home? Yes  If so, are there any without handrails? No  Home free of loose throw rugs in walkways, pet beds, electrical cords, etc? Yes  Adequate lighting in your home to reduce risk of falls? Yes   ASSISTIVE DEVICES UTILIZED TO PREVENT FALLS:  Life alert? No  Use of a cane, walker or w/c? No  Grab bars in the bathroom? No  Shower chair or bench in shower?  No  Elevated toilet seat or a handicapped toilet? No   TIMED UP AND GO:  Was the test performed? Yes .  Length of time to ambulate 10 feet: 10 sec.   Gait slow and steady without use of assistive device  Cognitive Function:    07/23/2020    4:10 PM  MMSE - Mini Mental State Exam  Orientation to time 4  Orientation to Place 5  Registration 3  Attention/ Calculation 5  Recall 3  Language- name 2 objects 2  Language- repeat 1  Language- follow 3 step command 3  Language- read & follow direction 1  Write a sentence 1  Copy design 1  Total score 29        10/20/2021   12:00 PM  6CIT Screen  What Year? 0 points  What month? 0 points  What time? 0 points    Immunizations Immunization History  Administered Date(s) Administered   Fluad Quad(high Dose 65+) 06/19/2022   Influenza Split 07/06/2010   Influenza, High Dose Seasonal PF 06/09/2015, 06/23/2016, 07/30/2019   Influenza, Seasonal, Injecte, Preservative Fre 07/06/2010   Influenza,inj,Quad PF,6+ Mos 04/16/2020   Influenza-Unspecified 06/15/2013, 06/09/2015, 06/23/2016, 06/19/2019, 07/30/2019, 07/08/2021   Moderna SARS-COV2 Booster Vaccination 07/08/2021, 06/19/2022   Moderna Sars-Covid-2 Vaccination 11/02/2019, 12/05/2019   PNEUMOCOCCAL CONJUGATE-20 07/23/2022   Pneumococcal Conjugate-13 06/23/2016   Pneumococcal Polysaccharide-23 10/19/2017   Zoster Recombinat (Shingrix) 12/05/2021   Zoster, Live 10/19/2017    TDAP status: Due, Education has been provided regarding the importance of this vaccine. Advised may receive this vaccine at local pharmacy or Health Dept. Aware to provide a copy of the vaccination record if obtained from local pharmacy or Health Dept. Verbalized acceptance and understanding.  Flu Vaccine status: Up to date  Pneumococcal vaccine status: Up to date  Covid-19 vaccine status: Completed vaccines  Qualifies for Shingles Vaccine? Yes   Zostavax completed Yes   Shingrix Completed?: No.     Education has been provided regarding the importance of this vaccine. Patient has been advised to call insurance company to determine out of pocket expense if they have not yet received this vaccine. Advised may also receive vaccine at local pharmacy or Health Dept. Verbalized acceptance and understanding.  Screening Tests Health Maintenance  Topic Date Due   Diabetic kidney evaluation - Urine ACR  Never done   DTaP/Tdap/Td (1 - Tdap) Never done   Zoster Vaccines- Shingrix (2 of 2) 01/30/2022   COVID-19 Vaccine (3 - Moderna risk series) 07/17/2022   HEMOGLOBIN A1C  01/22/2023   OPHTHALMOLOGY EXAM  07/06/2023   FOOT EXAM  07/24/2023   Diabetic kidney evaluation - eGFR measurement  09/01/2023   Medicare Annual Wellness (AWV)  10/25/2023   COLONOSCOPY (Pts 45-43yr Insurance coverage will need to be confirmed)  01/31/2030   Pneumonia Vaccine 77 Years old  Completed   INFLUENZA VACCINE  Completed   DEXA SCAN  Completed   Hepatitis C Screening  Completed   HPV VACCINES  Aged Out    Health Maintenance  Health Maintenance Due  Topic Date Due   Diabetic kidney evaluation - Urine ACR  Never done   DTaP/Tdap/Td (1 - Tdap) Never done   Zoster Vaccines- Shingrix (2 of 2) 01/30/2022   COVID-19 Vaccine (3 - Moderna risk series) 07/17/2022    Colorectal cancer screening: Type of screening: Colonoscopy. Completed 02/01/20. Repeat every 10 years  Mammogram status: No longer required due to age.  Bone Density status: Completed 05/01/20. Results reflect: Bone density results: OSTEOPENIA. Repeat every 2 years.  Lung Cancer Screening: (Low Dose CT Chest recommended if Age 76-80years, 30 pack-year currently smoking OR have quit w/in 15years.) does not qualify.   Lung Cancer Screening Referral: no  Additional Screening:  Hepatitis C Screening: does qualify; Completed 01/23/2015  Vision Screening: Recommended annual ophthalmology exams for early detection of glaucoma and other disorders of  the eye. Is the patient up to date with their annual eye exam?  No  Who is the provider or what is the name of the office in which the patient attends annual eye exams? Dr CJorja LoaIf pt is not established with a provider, would they like to be referred  to a provider to establish care? No .   Dental Screening: Recommended annual dental exams for proper oral hygiene  Community Resource Referral / Chronic Care Management: CRR required this visit?  No   CCM required this visit?  No      Plan:     I have personally reviewed and noted the following in the patient's chart:   Medical and social history Use of alcohol, tobacco or illicit drugs  Current medications and supplements including opioid prescriptions. Patient is not currently taking opioid prescriptions. Functional ability and status Nutritional status Physical activity Advanced directives List of other physicians Hospitalizations, surgeries, and ER visits in previous 12 months Vitals Screenings to include cognitive, depression, and falls Referrals and appointments  In addition, I have reviewed and discussed with patient certain preventive protocols, quality metrics, and best practice recommendations. A written personalized care plan for preventive services as well as general preventive health recommendations were provided to patient.     Quentin Angst, [place]   10/25/2022        .

## 2022-10-25 NOTE — Assessment & Plan Note (Signed)
Followed by nephrology (Dr. Theador Hawthorne).  Renal function stable on latest labs from January. -Starting Jardiance as noted above.

## 2022-10-25 NOTE — Assessment & Plan Note (Signed)
A1c increased to 7.2 from 5.7 previously.  She has a previously diagnosed history of [id number] complicated by CKD.  Not currently on any medications. -Repeat A1c ordered today -Start Jardiance 10 mg daily -Urine microalbumin/creatinine ratio ordered today

## 2022-10-25 NOTE — Progress Notes (Signed)
Established Patient Office Visit  Subjective   Patient ID: Nicole Horn, female    DOB: 08-01-1946  Age: 77 y.o. MRN: [id number]  Chief Complaint  Patient presents with   Diabetes    Follow up   Nicole Horn returns to care today for 51-monthfollow-up.  She was last seen by me on 2/14 for an acute visit with multiple concerns.  Clindagel was prescribed for twice daily application for treatment of hidradenitis suppurativa of the right axilla.  She additionally endorsed acute lumbar back pain.  I recommended supportive care measures for now and Flexeril was prescribed for nightly use as needed.  There have been no acute interval events. Today Nicole Horn states that her back pain has not improved.  She continues to deny red flag symptoms and states that her pain has not worsened.  She has attempted to continue going to the gym despite her pain.  She is interested in additional treatment options today.  Acute concerns and chronic medical conditions discussed today are individually addressed A/P below.  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   Type 2 diabetes mellitus ([facility]    Past Surgical History:  Procedure Laterality Date   ABDOMINAL HYSTERECTOMY     BACK SURGERY     cervical fuision   BARTHOLIN GLAND CYST EXCISION Left 03/04/2015   Procedure: EXCISION OF LEFT BARTHOLINS TUMOR;  Surgeon: JJonnie Kind MD;  Location: [facility];   Service: Gynecology;  Laterality: Left;   CATARACT EXTRACTION W/PHACO Right 03/27/2021   Procedure: CATARACT EXTRACTION PHACO AND INTRAOCULAR LENS PLACEMENT (IOC);  Surgeon: WBaruch Goldmann MD;  Location: [facility];  Service: Ophthalmology;  Laterality: Right;  cde 8.45   CATARACT EXTRACTION W/PHACO Left 11/06/2021   Procedure: CATARACT EXTRACTION PHACO AND INTRAOCULAR LENS PLACEMENT (IOC);  Surgeon: WBaruch Goldmann MD;  Location: [facility];  Service: Ophthalmology;  Laterality: Left;  CDE:13.69   CERVICAL SPINE SURGERY     COLONOSCOPY N/A 02/01/2020   Procedure: COLONOSCOPY;  Surgeon: RDaneil Dolin MD; Scattered medium mouth diverticula in the sigmoid and descending colon, otherwise normal exam.   CYST REMOVAL TRUNK     ESOPHAGOGASTRODUODENOSCOPY (EGD) WITH PROPOFOL N/A 03/27/2020   Surgeon: RDaneil Dolin MD;  normal esophagus and stomach, 2 small duodenal erosions.   GIVENS CAPSULE STUDY N/A 03/27/2020   Surgeon: RDaneil Dolin MD;  multiple small bowel angiectasia's   Multiple cyst removal surgeries     Stent of lower extremities     Social History   Tobacco Use   Smoking status: Former    Packs/day: 2.00    Years: 25.00    Total pack years: 50.00    Types: Cigarettes    Quit date: 03/10/1985    Years since quitting: 37.6   Smokeless tobacco: Never  Vaping Use   Vaping Use: Never used  Substance Use Topics   Alcohol  use: Not Currently    Comment: socially; rarely   Drug use: No   Family History  Problem Relation Age of Onset   Colon cancer Mother        In her 80s   Breast cancer Sister    Stomach cancer Maternal Aunt    Healthy Brother    Stroke Paternal Grandmother    Heart attack Paternal Grandmother    No Known Allergies  Review of Systems  Constitutional:  Negative for chills and fever.  HENT:  Negative for sore throat.   Respiratory:  Negative for cough and shortness of breath.   Cardiovascular:  Negative for chest pain, palpitations and leg swelling.   Gastrointestinal:  Negative for abdominal pain, blood in stool, constipation, diarrhea, nausea and vomiting.  Genitourinary:  Negative for dysuria and hematuria.  Musculoskeletal:  Positive for back pain (Lumbar). Negative for myalgias.  Skin:  Negative for itching and rash.  Neurological:  Negative for dizziness and headaches.  Psychiatric/Behavioral:  Negative for depression and suicidal ideas.      Objective:     BP (!) 140/82   Pulse 83   Ht 5' (1.524 m)   Wt 163 lb (73.9 kg)   SpO2 90%   BMI 31.83 kg/m  BP Readings from Last 3 Encounters:  10/25/22 (!) 140/82  10/25/22 (!) 140/82  10/14/22 132/62   Physical Exam Vitals reviewed.  Constitutional:      General: She is not in acute distress.    Appearance: Normal appearance. She is obese. She is not toxic-appearing.  HENT:     Head: Normocephalic and atraumatic.     Right Ear: External ear normal.     Left Ear: External ear normal.     Nose: Nose normal. No congestion or rhinorrhea.     Mouth/Throat:     Mouth: Mucous membranes are moist.     Pharynx: Oropharynx is clear. No oropharyngeal exudate or posterior oropharyngeal erythema.  Eyes:     General: No scleral icterus.    Extraocular Movements: Extraocular movements intact.     Conjunctiva/sclera: Conjunctivae normal.     Pupils: Pupils are equal, round, and reactive to light.  Cardiovascular:     Rate and Rhythm: Normal rate and regular rhythm.     Pulses: Normal pulses.     Heart sounds: Normal heart sounds. No murmur heard.    No friction rub. No gallop.  Pulmonary:     Effort: Pulmonary effort is normal.     Breath sounds: Normal breath sounds. No wheezing, rhonchi or rales.  Abdominal:     General: Abdomen is flat. Bowel sounds are normal. There is no distension.     Palpations: Abdomen is soft.     Tenderness: There is no abdominal tenderness.  Musculoskeletal:        General: Tenderness (TTP over the left paraspinal muscles of the lumbar spine)  present. No swelling. Normal range of motion.     Cervical back: Normal range of motion.     Right lower leg: No edema.     Left lower leg: No edema.  Lymphadenopathy:     Cervical: No cervical adenopathy.  Skin:    General: Skin is warm and dry.     Capillary Refill: Capillary refill takes less than 2 seconds.     Coloration: Skin is not jaundiced.  Neurological:     General: No focal deficit present.     Mental Status: She is alert and oriented to person, place,  and time.  Psychiatric:        Mood and Affect: Mood normal.        Behavior: Behavior normal.   Last CBC Lab Results  Component Value Date   WBC 3.3 (L) 08/31/2022   HGB 14.4 08/31/2022   HCT 47.0 (H) 08/31/2022   MCV 84.8 08/31/2022   MCH 26.0 08/31/2022   RDW 14.6 08/31/2022   PLT 252 0000000   Last metabolic panel Lab Results  Component Value Date   GLUCOSE 110 (H) 08/31/2022   NA 140 08/31/2022   K 3.9 08/31/2022   CL 105 08/31/2022   CO2 29 08/31/2022   BUN 16 08/31/2022   CREATININE 1.07 (H) 08/31/2022   GFRNONAA 54 (L) 08/31/2022   CALCIUM 9.7 08/31/2022   PROT 7.5 08/31/2022   ALBUMIN 4.0 08/31/2022   LABGLOB 2.6 05/13/2021   AGRATIO 1.7 05/13/2021   BILITOT 0.4 08/31/2022   ALKPHOS 93 08/31/2022   AST 24 08/31/2022   ALT 29 08/31/2022   ANIONGAP 6 08/31/2022   Last lipids Lab Results  Component Value Date   CHOL 115 08/10/2021   HDL 47 (L) 08/10/2021   LDLCALC 50 08/10/2021   TRIG 99 08/10/2021   CHOLHDL 2.4 08/10/2021   Last hemoglobin A1c Lab Results  Component Value Date   HGBA1C 7.2 (H) 07/23/2022   Last vitamin D Lab Results  Component Value Date   VD25OH 34.12 04/24/2020   Last vitamin B12 and Folate Lab Results  Component Value Date   VITAMINB12 328 06/06/2020   FOLATE 32.2 06/06/2020   The ASCVD Risk score (Arnett DK, et al., 2019) failed to calculate for the following reasons:   The patient has a prior MI or stroke diagnosis    Assessment & Plan:    Problem List Items Addressed This Visit       Type 2 diabetes mellitus with stage 2 chronic kidney disease, without long-term current use of insulin ([employer]) - Primary    A1c increased to 7.2 from 5.7 previously.  She has a previously diagnosed history of 123456 complicated by CKD.  Not currently on any medications. -Repeat A1c ordered today -Start Jardiance 10 mg daily -Urine microalbumin/creatinine ratio ordered today      Chronic kidney disease, stage II (mild)    Followed by nephrology (Dr. Theador Hawthorne).  Renal function stable on latest labs from January. -Starting Jardiance as noted above.      Acute lumbar back pain    Previously evaluated by me on 2/14 endorsing a 3-day history of acute lumbar back pain.  No inciting event or trauma was noted at the onset of pain.  No red flag symptoms identified.  Her pain has not improved with use of Tylenol/Voltaren gel and Flexeril nightly as needed.  Her exam is largely unchanged today.  There is tenderness palpation of the left paraspinal muscles of the lumbar spine. -Treatment options reviewed.  She will attempt home PT over the next several weeks.  If there is no significant improvement in her pain, consider referral for formal physical therapy vs orthopedic surgery evaluation.      Return in about 3 months (around 01/25/2023).   Johnette Abraham, MD

## 2022-11-02 ENCOUNTER — Other Ambulatory Visit: Payer: Self-pay | Admitting: Internal Medicine

## 2022-11-02 DIAGNOSIS — L732 Hidradenitis suppurativa: Secondary | ICD-10-CM

## 2022-12-08 ENCOUNTER — Telehealth: Payer: Self-pay | Admitting: Internal Medicine

## 2022-12-08 NOTE — Telephone Encounter (Signed)
Samples at the front desk 

## 2022-12-08 NOTE — Telephone Encounter (Signed)
Patient called asked can she come in and pick up samples trial Jardiance call back # [phone]

## 2022-12-19 ENCOUNTER — Other Ambulatory Visit: Payer: Self-pay | Admitting: Family Medicine

## 2023-01-11 ENCOUNTER — Encounter: Payer: Self-pay | Admitting: Adult Health

## 2023-01-11 ENCOUNTER — Ambulatory Visit: Payer: Medicare PPO | Admitting: Adult Health

## 2023-01-11 VITALS — BP 138/69 | HR 84 | Ht 60.0 in | Wt 160.0 lb

## 2023-01-11 DIAGNOSIS — N764 Abscess of vulva: Secondary | ICD-10-CM | POA: Diagnosis not present

## 2023-01-11 MED ORDER — DOXYCYCLINE HYCLATE 100 MG PO TABS: 100.0000 mg | ORAL_TABLET | Freq: Two times a day (BID) | ORAL | 0 refills | Status: DC

## 2023-01-11 MED ORDER — SILVER SULFADIAZINE 1 % EX CREA: 1.0000 | TOPICAL_CREAM | Freq: Two times a day (BID) | CUTANEOUS | 0 refills | Status: AC

## 2023-01-11 NOTE — Progress Notes (Signed)
  Subjective:     Patient ID: Nicole Horn, female   DOB: 1946/02/21, 77 y.o.   MRN: [id number]  HPI Nicole Horn is a 77 year old black female, widowed, sp hysterectomy in complaining of boil on labia, has been using warm compress and it is draining some.  PCP is Dr Durwin Nora  Review of Systems Has boil Reviewed past medical,surgical, social and family history. Reviewed medications and allergies.     Objective:   Physical Exam BP 138/69 (BP Location: Left Arm, Patient Position: Sitting, Cuff Size: Normal)   Pulse 84   Ht 5' (1.524 m)   Wt 160 lb (72.6 kg)   BMI 31.25 kg/m     Skin warm and dry.Pelvic: external genitalia is normal in appearance, except has 2 cm boil left labia, it is starting to drain, scant gray fluid, +tender.  Fall risk is low  Upstream - 01/11/23 1056       Pregnancy Intention Screening   Does the patient want to become pregnant in the next year? N/A    Does the patient's partner want to become pregnant in the next year? N/A    Would the patient like to discuss contraceptive options today? N/A      Contraception Wrap Up   Current Method Female Sterilization   hyst   End Method Female Sterilization   hyst   Contraception Counseling Provided No            Examination chaperoned by Malachy Mood LPN  Assessment:     1. Boil of vulva Has 2 cm boil left labia, she declines I&D today   Will rx doxycycline 100 mg 1 bid x 10 days and silvadene Use warm compresses, no not squeeze Review handout on boils  Meds ordered this encounter  Medications   doxycycline (VIBRA-TABS) 100 MG tablet    Sig: Take 1 tablet (100 mg total) by mouth 2 (two) times daily.    Dispense:  20 tablet    Refill:  0    Order Specific Question:   Supervising Provider    Answer:   Duane Lope H [[id number]]   silver sulfADIAZINE (SILVADENE) 1 % cream    Sig: Apply 1 Application topically 2 (two) times daily. Apply bid to affected area externally    Dispense:  50 g    Refill:  0     Order Specific Question:   Supervising Provider    Answer:   Lazaro Arms [[id number]]       Plan:     Follow up 01/20/23 for recheck

## 2023-01-13 ENCOUNTER — Ambulatory Visit: Payer: Medicare PPO | Admitting: Adult Health

## 2023-01-20 ENCOUNTER — Encounter: Payer: Self-pay | Admitting: Adult Health

## 2023-01-20 ENCOUNTER — Ambulatory Visit (INDEPENDENT_AMBULATORY_CARE_PROVIDER_SITE_OTHER): Payer: Medicare PPO | Admitting: Adult Health

## 2023-01-20 VITALS — BP 105/62 | HR 81 | Ht 60.0 in | Wt 161.0 lb

## 2023-01-20 DIAGNOSIS — N764 Abscess of vulva: Secondary | ICD-10-CM | POA: Diagnosis not present

## 2023-01-20 NOTE — Progress Notes (Signed)
  Subjective:     Patient ID: Nicole Horn, female   DOB: 04-May-1946, 77 y.o.   MRN: [id number]  HPI Nicole Horn is a 77 year old black female, widowed, sp hysterectomy back in follow up on boil left vulva and is much better.  PCP is Dr Durwin Nora  Review of Systems Feels much better Reviewed past medical,surgical, social and family history. Reviewed medications and allergies.     Objective:   Physical Exam BP 105/62 (BP Location: Left Arm, Patient Position: Sitting, Cuff Size: Normal)   Pulse 81   Ht 5' (1.524 m)   Wt 161 lb (73 kg)   BMI 31.44 kg/m     Skin warm and dry.Pelvic: external genitalia is normal in appearance, boil left vulva is resolving,smaller, non tender no redness.  Flowsheet Row Office Visit from 01/20/2023 in [facility] Total Score 0        Upstream - 01/20/23 1228       Pregnancy Intention Screening   Does the patient want to become pregnant in the next year? N/A    Does the patient's partner want to become pregnant in the next year? N/A    Would the patient like to discuss contraceptive options today? N/A      Contraception Wrap Up   Current Method --   hyst   End Method --   hyst   Contraception Counseling Provided No    How was the end contraceptive method provided? N/A            Examination chaperoned by Juliane Lack CMA  Assessment:     1. Boil of vulva Resolving, much smaller, non tender, no redness Finish doxycycline and use silvadene prn     Plan:     Follow up prn

## 2023-01-24 ENCOUNTER — Ambulatory Visit: Payer: Medicare PPO | Admitting: Internal Medicine

## 2023-01-28 ENCOUNTER — Ambulatory Visit (INDEPENDENT_AMBULATORY_CARE_PROVIDER_SITE_OTHER): Payer: Medicare PPO | Admitting: Internal Medicine

## 2023-01-28 ENCOUNTER — Encounter: Payer: Self-pay | Admitting: Internal Medicine

## 2023-01-28 VITALS — BP 112/57 | HR 85 | Ht 60.0 in | Wt 161.6 lb

## 2023-01-28 DIAGNOSIS — E1122 Type 2 diabetes mellitus with diabetic chronic kidney disease: Secondary | ICD-10-CM | POA: Diagnosis not present

## 2023-01-28 DIAGNOSIS — Z7984 Long term (current) use of oral hypoglycemic drugs: Secondary | ICD-10-CM | POA: Diagnosis not present

## 2023-01-28 DIAGNOSIS — L732 Hidradenitis suppurativa: Secondary | ICD-10-CM | POA: Diagnosis not present

## 2023-01-28 DIAGNOSIS — N182 Chronic kidney disease, stage 2 (mild): Secondary | ICD-10-CM | POA: Diagnosis not present

## 2023-01-28 NOTE — Progress Notes (Signed)
Established Patient Office Visit  Subjective   Patient ID: Nicole Horn, female    DOB: Apr 25, 1946  Age: 77 y.o. MRN: [id number]  Chief Complaint  Patient presents with   Diabetes    Follow up   Dizziness    Had a spell and wasn't able to walk without holding onto something lasted a couple hours then felt fine.   [name] returns to care today for routine follow-up.  She was last evaluated by me on 3/4.  Jardiance 10 mg daily was started at that time for treatment of diabetes in the setting of CKD.  In the interim she has been evaluated by gynecology for management of a vulvar boil.  There have otherwise been no acute interval events. [name] reports feeling well today.  She is asymptomatic and has no acute concerns to discuss.  She endorses a recent episode of dizziness, noting that this lasted multiple hours.  She was not able to walk without holding on to something.  She has not had any additional episodes since this event.  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   Type 2 diabetes mellitus (HCC)    Past Surgical History:  Procedure Laterality Date   ABDOMINAL HYSTERECTOMY     BACK SURGERY     cervical fuision   BARTHOLIN GLAND CYST EXCISION Left 03/04/2015   Procedure: EXCISION OF LEFT BARTHOLINS TUMOR;  Surgeon: Tilda Burrow, MD;  Location: [facility];  Service: Gynecology;  Laterality: Left;   CATARACT EXTRACTION  W/PHACO Right 03/27/2021   Procedure: CATARACT EXTRACTION PHACO AND INTRAOCULAR LENS PLACEMENT (IOC);  Surgeon: Fabio Pierce, MD;  Location: [facility];  Service: Ophthalmology;  Laterality: Right;  cde 8.45   CATARACT EXTRACTION W/PHACO Left 11/06/2021   Procedure: CATARACT EXTRACTION PHACO AND INTRAOCULAR LENS PLACEMENT (IOC);  Surgeon: Fabio Pierce, MD;  Location: [facility];  Service: Ophthalmology;  Laterality: Left;  CDE:13.69   CERVICAL SPINE SURGERY     COLONOSCOPY N/A 02/01/2020   Procedure: COLONOSCOPY;  Surgeon: Corbin Ade, MD; Scattered medium mouth diverticula in the sigmoid and descending colon, otherwise normal exam.   CYST REMOVAL TRUNK     ESOPHAGOGASTRODUODENOSCOPY (EGD) WITH PROPOFOL N/A 03/27/2020   Surgeon: Corbin Ade, MD;  normal esophagus and stomach, 2 small duodenal erosions.   GIVENS CAPSULE STUDY N/A 03/27/2020   Surgeon: Corbin Ade, MD;  multiple small bowel angiectasia's   Multiple cyst removal surgeries     Stent of lower extremities     Social History   Tobacco Use   Smoking status: Former    Packs/day: 2.00    Years: 25.00    Additional pack years: 0.00    Total pack years: 50.00    Types: Cigarettes    Quit date: 03/10/1985    Years since quitting: 37.9   Smokeless tobacco: Never  Vaping Use   Vaping  Use: Never used  Substance Use Topics   Alcohol use: Yes    Comment: socially; rarely   Drug use: No   Family History  Problem Relation Age of Onset   Stroke Paternal Grandmother    Heart attack Paternal Grandmother    Colon cancer Mother        In her 74s   Healthy Brother    Breast cancer Sister    Stomach cancer Maternal Aunt    No Known Allergies  Review of Systems  Constitutional:  Negative for chills and fever.  HENT:  Negative for sore throat.   Respiratory:  Negative for cough and shortness of breath.   Cardiovascular:  Negative for chest pain, palpitations and leg swelling.  Gastrointestinal:  Negative for abdominal  pain, blood in stool, constipation, diarrhea, nausea and vomiting.  Genitourinary:  Negative for dysuria and hematuria.  Musculoskeletal:  Negative for myalgias.  Skin:  Negative for itching and rash.  Neurological:  Negative for dizziness and headaches.  Psychiatric/Behavioral:  Negative for depression and suicidal ideas.      Objective:     BP (!) 112/57   Pulse 85   Ht 5' (1.524 m)   Wt 161 lb 9.6 oz (73.3 kg)   SpO2 96%   BMI 31.56 kg/m  BP Readings from Last 3 Encounters:  01/28/23 (!) 112/57  01/20/23 105/62  01/11/23 138/69    Physical Exam Vitals reviewed.  Constitutional:      General: She is not in acute distress.    Appearance: Normal appearance. She is obese. She is not toxic-appearing.  HENT:     Head: Normocephalic and atraumatic.     Right Ear: External ear normal.     Left Ear: External ear normal.     Nose: Nose normal. No congestion or rhinorrhea.     Mouth/Throat:     Mouth: Mucous membranes are moist.     Pharynx: Oropharynx is clear. No oropharyngeal exudate or posterior oropharyngeal erythema.  Eyes:     General: No scleral icterus.    Extraocular Movements: Extraocular movements intact.     Conjunctiva/sclera: Conjunctivae normal.     Pupils: Pupils are equal, round, and reactive to light.  Cardiovascular:     Rate and Rhythm: Normal rate and regular rhythm.     Pulses: Normal pulses.     Heart sounds: Normal heart sounds. No murmur heard.    No friction rub. No gallop.  Pulmonary:     Effort: Pulmonary effort is normal.     Breath sounds: Normal breath sounds. No wheezing, rhonchi or rales.  Abdominal:     General: Abdomen is flat. Bowel sounds are normal. There is no distension.     Palpations: Abdomen is soft.     Tenderness: There is no abdominal tenderness.  Musculoskeletal:        General: No swelling. Normal range of motion.     Cervical back: Normal range of motion.     Right lower leg: No edema.     Left lower leg: No edema.   Lymphadenopathy:     Cervical: No cervical adenopathy.  Skin:    General: Skin is warm and dry.     Capillary Refill: Capillary refill takes less than 2 seconds.     Coloration: Skin is not jaundiced.     Findings: Lesion (Hidradenitis suppurativa noted under right armpit t) present.  Neurological:     General: No focal deficit present.     Mental Status:  She is alert and oriented to person, place, and time.  Psychiatric:        Mood and Affect: Mood normal.        Behavior: Behavior normal.   Last CBC Lab Results  Component Value Date   WBC 3.3 (L) 08/31/2022   HGB 14.4 08/31/2022   HCT 47.0 (H) 08/31/2022   MCV 84.8 08/31/2022   MCH 26.0 08/31/2022   RDW 14.6 08/31/2022   PLT 252 08/31/2022   Last metabolic panel Lab Results  Component Value Date   GLUCOSE 110 (H) 08/31/2022   NA 140 08/31/2022   K 3.9 08/31/2022   CL 105 08/31/2022   CO2 29 08/31/2022   BUN 16 08/31/2022   CREATININE 1.07 (H) 08/31/2022   GFRNONAA 54 (L) 08/31/2022   CALCIUM 9.7 08/31/2022   PROT 7.5 08/31/2022   ALBUMIN 4.0 08/31/2022   LABGLOB 2.6 05/13/2021   AGRATIO 1.7 05/13/2021   BILITOT 0.4 08/31/2022   ALKPHOS 93 08/31/2022   AST 24 08/31/2022   ALT 29 08/31/2022   ANIONGAP 6 08/31/2022   Last lipids Lab Results  Component Value Date   CHOL 115 08/10/2021   HDL 47 (L) 08/10/2021   LDLCALC 50 08/10/2021   TRIG 99 08/10/2021   CHOLHDL 2.4 08/10/2021   Last hemoglobin A1c Lab Results  Component Value Date   HGBA1C 7.2 (H) 07/23/2022   Last vitamin  Lab Results  Component Value Date   VD25OH 34.12 04/24/2020   Last vitamin B12 and Folate Lab Results  Component Value Date   VITAMINB12 328 06/06/2020   FOLATE 32.2 06/06/2020     Assessment & Plan:   Problem List Items Addressed This Visit       Type 2 diabetes mellitus with stage 2 chronic kidney disease, without long-term current use of insulin (HCC)    A1c 7.2 on labs from December.  Jardiance 10 mg daily was  started at her last appointment in the setting of CKD.  Recently seen by nephrology and reports that London Pepper is currently on hold.  She will follow-up with her nephrologist next week to discuss resuming Jardiance. -No additional medication changes today. -Repeat A1c at follow-up in 3 months      Hidradenitis suppurativa of right axilla    Dermatology referral placed today for further evaluation.      Chronic kidney disease, stage II (mild)    Followed by nephrology (Dr. Wolfgang Phoenix).  Jardiance 10 mg daily was prescribed at her last appointment, but she reports today that London Pepper is currently on hold.  She will follow-up with Dr. Wolfgang Phoenix next week for repeat labs and to discuss resuming Jardiance.      Return in about 3 months (around 04/30/2023).   Billie Lade, MD

## 2023-01-28 NOTE — Patient Instructions (Signed)
It was a pleasure to see you today.  Thank you for giving [place] the opportunity to be involved in your care.  Below is a brief recap of your visit and next steps.  We will plan to see you again in 3 months.  Summary No medication changes today We will plan for follow up in 3 months You have been referred to dermatology to evaluate the lesions under your arms

## 2023-01-28 NOTE — Assessment & Plan Note (Signed)
Dermatology referral placed today for further evaluation.

## 2023-01-28 NOTE — Assessment & Plan Note (Signed)
Followed by nephrology (Dr. Wolfgang Phoenix).  Jardiance 10 mg daily was prescribed at her last appointment, but she reports today that London Pepper is currently on hold.  She will follow-up with Dr. Wolfgang Phoenix next week for repeat labs and to discuss resuming Jardiance.

## 2023-01-28 NOTE — Assessment & Plan Note (Signed)
A1c 7.2 on labs from December.  Jardiance 10 mg daily was started at her last appointment in the setting of CKD.  Recently seen by nephrology and reports that London Pepper is currently on hold.  She will follow-up with her nephrologist next week to discuss resuming Jardiance. -No additional medication changes today. -Repeat A1c at follow-up in 3 months

## 2023-01-31 ENCOUNTER — Other Ambulatory Visit: Payer: Self-pay | Admitting: Cardiovascular Disease

## 2023-01-31 ENCOUNTER — Ambulatory Visit: Payer: Medicare PPO | Admitting: Internal Medicine

## 2023-01-31 NOTE — Telephone Encounter (Signed)
Patient of Dr. McDowell. Please review for refill. Thank you!  

## 2023-03-02 ENCOUNTER — Telehealth: Payer: Self-pay | Admitting: Internal Medicine

## 2023-03-02 NOTE — Telephone Encounter (Signed)
Samples left at front desk and pt assistance packet left as well. Will pick up 03/03/2023.

## 2023-03-02 NOTE — Telephone Encounter (Signed)
Patient called in requesting sample of JARDIENCE from office. Wants a call back in regard

## 2023-03-18 ENCOUNTER — Telehealth: Payer: Self-pay | Admitting: Internal Medicine

## 2023-03-18 NOTE — Telephone Encounter (Signed)
Pt called wanting Dr. Durwin Nora to type a note stating that Frances Nickels is her primary care giver and she wants him to take over everything when she pass away.

## 2023-03-18 NOTE — Telephone Encounter (Signed)
Spoke with patient. Will leave paperwork at the front desk.

## 2023-03-25 ENCOUNTER — Telehealth: Payer: Self-pay | Admitting: Internal Medicine

## 2023-03-25 ENCOUNTER — Other Ambulatory Visit: Payer: Self-pay | Admitting: Cardiology

## 2023-03-25 NOTE — Telephone Encounter (Signed)
Patient called left a voicemail during lunch hours asking for a letter staying her son is her care giver.  Patient ask for a call back once this letter has been written to pick up. [phone]

## 2023-04-15 ENCOUNTER — Telehealth: Payer: Self-pay | Admitting: Internal Medicine

## 2023-04-15 NOTE — Telephone Encounter (Signed)
See epic message.

## 2023-04-15 NOTE — Telephone Encounter (Signed)
Only had 1 sample in office and left at the front desk for patient.

## 2023-04-15 NOTE — Telephone Encounter (Signed)
Pt called in lvm in regard to picking up Jardience. Wants a call a call back.

## 2023-04-15 NOTE — Telephone Encounter (Signed)
Patient has started to fill these out, but does not think they will do her any good. So is she able to get the jardence pills today. She will be running out tomorrow.  FYI, leaving out of town Sunday for a whole week. Her call back # [phone].

## 2023-04-20 ENCOUNTER — Ambulatory Visit: Payer: Medicare PPO | Admitting: Student

## 2023-04-20 NOTE — Progress Notes (Deleted)
Cardiology Office Note    Date:  04/20/2023  ID:  Nicole Horn 04-13-1946, MRN [id number] Cardiologist: Nicole Dell, MD    History of Present Illness:    Nicole Horn is a 77 y.o. female with past medical history of HTN, HLD, palpitations (brief SVT by prior monitor in 2022), history of chest pain (reported MI in 2004 and underwent cath with no intervention and details unavailable), Type 2 DM, Stage 3 CKD and PAD (s/p PTCA and stenting in 2019 in [place]) who presents to the office today for 72-month follow-up.  She was last examined by Dr. Diona Horn in 07/2022 and was recovering from an upper respiratory infection. She did report intermittent dizziness and had stopped her blood pressure medications.  She was encouraged to follow BP at home as he may need to resume Amlodipine or Aldactone. She was continued on Toprol-XL 12.5 mg daily as she had been on this for palpitations.  ROS: ***  Studies Reviewed:   EKG: EKG is*** ordered today and demonstrates ***   EKG Interpretation Date/Time:    Ventricular Rate:    PR Interval:    QRS Duration:    QT Interval:    QTC Calculation:   R Axis:      Text Interpretation:         Echocardiogram: 02/2020 IMPRESSIONS     1. Left ventricular ejection fraction, by estimation, is 60 to 65%. The  left ventricle has normal function. The left ventricle has no regional  wall motion abnormalities. There is mild left ventricular hypertrophy.  Left ventricular diastolic parameters  are consistent with Grade I diastolic dysfunction (impaired relaxation).   2. Right ventricular systolic function is normal. The right ventricular  size is normal. There is normal pulmonary artery systolic pressure.   3. Right atrial size was mildly dilated.   4. The mitral valve is normal in structure. No evidence of mitral valve  regurgitation. No evidence of mitral stenosis.   5. The aortic valve is tricuspid. Aortic valve  regurgitation is not  visualized. No aortic stenosis is present.   6. The inferior vena cava is normal in size with greater than 50%  respiratory variability, suggesting right atrial pressure of 3 mmHg.   Event Monitor: 01/2021 ZIO XT reviewed. 8 days, 11 hours analyzed. Predominant rhythm is sinus with heart rate ranging from 48 bpm up to 118 bpm and average heart rate 76 bpm. There were rare PACs including couplets and triplets representing less than 1% total beats. Rare PVCs were noted representing less than 1% total beats. There were 2 very brief episodes of SVT, the longest of which was only 5 beats. No sustained arrhythmias or pauses.   Risk Assessment/Calculations:   {Does this patient have ATRIAL FIBRILLATION?:[phone]} No BP recorded.  {Refresh Note OR Click here to enter BP  :1}***         Physical Exam:   VS:  There were no vitals taken for this visit.   Wt Readings from Last 3 Encounters:  01/28/23 161 lb 9.6 oz (73.3 kg)  01/20/23 161 lb (73 kg)  01/11/23 160 lb (72.6 kg)     GEN: Well nourished, well developed in no acute distress NECK: No JVD; No carotid bruits CARDIAC: ***RRR, no murmurs, rubs, gallops RESPIRATORY:  Clear to auscultation without rales, wheezing or rhonchi  ABDOMEN: Appears non-distended. No obvious abdominal masses. EXTREMITIES: No clubbing or cyanosis. No edema.  Distal pedal pulses are 2+ bilaterally.   Assessment  and Plan:   1. Palpitations - Prior monitor and 01/2021 did show rare PAC's and PVC's with brief SVT.  2. CAD - She has a history of reported MI as discussed above but did not require intervention at that time. ***  3. HTN - ***  4. HLD - Followed by her PCP.  FLP in 2023 showed her LDL was at 51. Continue current medical therapy with Crestor 40 mg daily.  5. PAD - ***  6. Stage 3 CKD - Followed by Dr. Wolfgang Horn. Creatinine was at 1.62 when checked in 11/2022.  Signed, Nicole Lennox, PA-C

## 2023-04-25 ENCOUNTER — Other Ambulatory Visit: Payer: Self-pay | Admitting: Internal Medicine

## 2023-04-25 DIAGNOSIS — L732 Hidradenitis suppurativa: Secondary | ICD-10-CM

## 2023-05-02 ENCOUNTER — Other Ambulatory Visit: Payer: Self-pay | Admitting: Internal Medicine

## 2023-05-02 DIAGNOSIS — M545 Low back pain, unspecified: Secondary | ICD-10-CM

## 2023-05-06 ENCOUNTER — Other Ambulatory Visit: Payer: Self-pay

## 2023-05-06 DIAGNOSIS — N182 Chronic kidney disease, stage 2 (mild): Secondary | ICD-10-CM

## 2023-05-06 MED ORDER — EMPAGLIFLOZIN 10 MG PO TABS: 10.0000 mg | ORAL_TABLET | Freq: Every day | ORAL | 2 refills | Status: DC

## 2023-05-06 MED ORDER — AMLODIPINE BESYLATE 10 MG PO TABS: 10.0000 mg | ORAL_TABLET | Freq: Every day | ORAL | 2 refills | Status: DC

## 2023-05-06 MED ORDER — ASPIRIN 81 MG PO TBEC: 81.0000 mg | DELAYED_RELEASE_TABLET | Freq: Every day | ORAL | 3 refills | Status: AC

## 2023-05-06 MED ORDER — METOPROLOL SUCCINATE ER 25 MG PO TB24: 12.5000 mg | ORAL_TABLET | Freq: Every day | ORAL | 2 refills | Status: DC

## 2023-05-11 ENCOUNTER — Other Ambulatory Visit: Payer: Self-pay | Admitting: *Deleted

## 2023-05-11 ENCOUNTER — Ambulatory Visit: Payer: Medicare PPO | Admitting: Internal Medicine

## 2023-05-11 MED ORDER — VALACYCLOVIR HCL 500 MG PO TABS: 500.0000 mg | ORAL_TABLET | Freq: Every day | ORAL | 3 refills | Status: DC

## 2023-05-16 ENCOUNTER — Ambulatory Visit
Admission: EM | Admit: 2023-05-16 | Discharge: 2023-05-16 | Disposition: A | Discharge status: Home / Self Care | Payer: Medicare PPO | Attending: Nurse Practitioner | Admitting: Nurse Practitioner

## 2023-05-16 DIAGNOSIS — J22 Unspecified acute lower respiratory infection: Secondary | ICD-10-CM | POA: Diagnosis not present

## 2023-05-16 MED ORDER — AMOXICILLIN-POT CLAVULANATE 875-125 MG PO TABS: 1.0000 | ORAL_TABLET | Freq: Two times a day (BID) | ORAL | 0 refills | Status: DC

## 2023-05-16 NOTE — ED Triage Notes (Addendum)
Pt c/o cough, congestion, SOB with movement  reddish color to green phlegm, sore feeling inside, nausea , redness in the eyes. x 2.5 weeks

## 2023-05-16 NOTE — ED Provider Notes (Signed)
[facility]    CSN: [id number] Arrival date & time: 05/16/23  1537      History   Chief Complaint No chief complaint on file.   HPI Nicole Horn is a 77 y.o. female.   The history is provided by the patient.   Patient presents for complaints of cough that has been present for the past 2 weeks.  Patient also complains of subjective fever, nasal congestion, shortness of breath with exertion, along with blood-tinged sputum.  Patient reports that she is also experienced some nausea and eye redness.  She denies headache, ear pain, sore throat, wheezing, difficulty breathing, abdominal pain, vomiting, or diarrhea.  Patient reports that she has been taking over-the-counter Mucinex and Robitussin for her symptoms.  Patient also reports that she has been using her albuterol inhaler that she has had since she developed COVID.    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   Type 2 diabetes mellitus [facility])     Patient Active Problem List   Diagnosis Date Noted   Boil of vulva 01/11/2023   Acute lumbar back pain 10/06/2022   Chest pain 10/06/2022   Hidradenitis suppurativa of right axilla 10/06/2022   Encounter for well adult exam with abnormal findings 07/23/2022   Hepatic steatosis 01/21/2022   Epistaxis 01/21/2022   Compulsive gambling 11/25/2021   Concussion with no loss of consciousness 09/22/2021   Persistent headaches 09/22/2021    Frequent headaches 09/22/2021   Prediabetes 09/22/2021   LFTs abnormal 02/06/2021   Herpes 01/02/2021   Palpitations 11/27/2020   Depression, major, single episode, severe (HCC) 10/22/2020   Insomnia 10/22/2020   Caregiver role strain 07/23/2020   Primary osteoarthritis of first carpometacarpal joint of right hand 07/16/2020   Annual visit for general adult medical examination with abnormal findings 04/16/2020   Postmenopausal 04/16/2020   Screening mammogram, encounter for 04/16/2020   Atypical mole 04/16/2020   Need for immunization against influenza 04/16/2020   Constipation 03/17/2020   PVD (peripheral vascular disease) (HCC) 03/14/2020   CAD (coronary artery disease) 03/14/2020   Iron deficiency anemia 03/14/2020   Dizziness 03/13/2020   DOE (dyspnea on exertion) 03/10/2020   H/O adenomatous polyp of colon 01/02/2020   FH: colon cancer 01/02/2020   Heart disease 12/11/2019   Upper airway cough syndrome 11/13/2019   Obesity (BMI 30-39.9) 10/16/2019   Essential hypertension 09/10/2019   Hyperlipemia 09/10/2019   OSA on CPAP 04/18/2019   PAD (peripheral artery disease) (HCC) 05/13/2017   Claudication (HCC) 02/21/2017   Type 2 diabetes mellitus with stage 2 chronic kidney disease, without long-term current use of insulin (HCC) 06/09/2015   Chronic kidney disease, stage II (mild) 03/06/2012    Past Surgical History:  Procedure Laterality Date   ABDOMINAL HYSTERECTOMY     BACK SURGERY     cervical fuision   BARTHOLIN GLAND CYST EXCISION Left 03/04/2015  Procedure: EXCISION OF LEFT BARTHOLINS TUMOR;  Surgeon: Tilda Burrow, MD;  Location: [facility];  Service: Gynecology;  Laterality: Left;   CATARACT EXTRACTION W/PHACO Right 03/27/2021   Procedure: CATARACT EXTRACTION PHACO AND INTRAOCULAR LENS PLACEMENT (IOC);  Surgeon: Fabio Pierce, MD;  Location: [facility];  Service: Ophthalmology;  Laterality: Right;  cde 8.45   CATARACT EXTRACTION W/PHACO Left 11/06/2021   Procedure:  CATARACT EXTRACTION PHACO AND INTRAOCULAR LENS PLACEMENT (IOC);  Surgeon: Fabio Pierce, MD;  Location: [facility];  Service: Ophthalmology;  Laterality: Left;  CDE:13.69   CERVICAL SPINE SURGERY     COLONOSCOPY N/A 02/01/2020   Procedure: COLONOSCOPY;  Surgeon: Corbin Ade, MD; Scattered medium mouth diverticula in the sigmoid and descending colon, otherwise normal exam.   CYST REMOVAL TRUNK     ESOPHAGOGASTRODUODENOSCOPY (EGD) WITH PROPOFOL N/A 03/27/2020   Surgeon: Corbin Ade, MD;  normal esophagus and stomach, 2 small duodenal erosions.   GIVENS CAPSULE STUDY N/A 03/27/2020   Surgeon: Corbin Ade, MD;  multiple small bowel angiectasia's   Multiple cyst removal surgeries     Stent of lower extremities      OB History     Gravida  1   Para  1   Term      Preterm  1   AB      Living  1      SAB      IAB      Ectopic      Multiple      Live Births  1            Home Medications    Prior to Admission medications   Medication Sig Start Date End Date Taking? Authorizing Provider  amoxicillin-clavulanate (AUGMENTIN) 875-125 MG tablet Take 1 tablet by mouth every 12 (twelve) hours. 05/16/23  Yes Faizan Geraci-Warren, Sadie Haber, NP  albuterol (VENTOLIN HFA) 108 (90 Base) MCG/ACT inhaler Inhale 1-2 puffs into the lungs every 6 (six) hours as needed for wheezing or shortness of breath.    [provider]  amLODipine (NORVASC) 10 MG tablet Take 1 tablet (10 mg total) by mouth daily. 05/06/23   Billie Lade, MD  aspirin EC 81 MG tablet Take 81 mg by mouth in the morning. Swallow whole.    [provider]  aspirin EC 81 MG tablet Take 1 tablet (81 mg total) by mouth daily. Swallow whole. 05/06/23   Billie Lade, MD  Blood Glucose Calibration (TRUE METRIX LEVEL 1) Low SOLN 1 application by In Vitro route daily Max Daily Amount: 1 application 11/01/17   [provider]  Blood Glucose Monitoring Suppl (TRUE METRIX GO GLUCOSE METER) w/Device  KIT by Does not apply route. 10/28/17   [provider]  Calcium Carbonate Antacid (CALCIUM CARBONATE PO) Take 1,200 mg of elemental calcium by mouth in the morning.    [provider]  cholecalciferol (VITAMIN D3) 25 MCG (1000 UNIT) tablet Take 1,000 Units by mouth daily.    [provider]  clopidogrel (PLAVIX) 75 MG tablet TAKE 1 TABLET BY MOUTH  DAILY 08/28/21   Kerri Perches, MD  empagliflozin (JARDIANCE) 10 MG TABS tablet Take 1 tablet (10 mg total) by mouth daily before breakfast. 05/06/23 08/04/23  Billie Lade, MD  Ferrous Sulfate (IRON PO) Take by mouth daily.    [provider]  FLUoxetine (PROZAC) 10 MG capsule TAKE 1 CAPSULE BY MOUTH EVERY DAY 12/21/22   Rodolph Bong, MD  metoprolol  succinate (TOPROL XL) 25 MG 24 hr tablet Take 0.5 tablets (12.5 mg total) by mouth daily. 05/06/23   Jonelle Sidle, MD  Multiple Vitamin (MULTIVITAMIN WITH MINERALS) TABS tablet Take 1 tablet by mouth in the morning. Centrum    [provider]  nortriptyline (PAMELOR) 10 MG capsule TAKE 1 CAPSULE BY MOUTH EVERYDAY AT BEDTIME 09/13/22   Rodolph Bong, MD  Probiotic Product (DIGESTIVE ADVANTAGE) CAPS Take 1 capsule by mouth daily.    [provider]  rosuvastatin (CRESTOR) 40 MG tablet Take 1 tablet (40 mg total) by mouth daily. 03/23/21   Heather Roberts, NP  silver sulfADIAZINE (SILVADENE) 1 % cream Apply 1 Application topically 2 (two) times daily. Apply bid to affected area externally 01/11/23   Adline Potter, NP  spironolactone (ALDACTONE) 25 MG tablet TAKE 1 TABLET (25 MG TOTAL) BY MOUTH DAILY. 01/31/23   Jonelle Sidle, MD  valACYclovir (VALTREX) 500 MG tablet Take 1 tablet (500 mg total) by mouth daily. 05/11/23   Lazaro Arms, MD  vitamin C (ASCORBIC ACID) 500 MG tablet Take 500 mg by mouth 2 (two) times a week.    [provider]    Family History Family History  Problem Relation Age of Onset   Stroke Paternal  Grandmother    Heart attack Paternal Grandmother    Colon cancer Mother        In her 59s   Healthy Brother    Breast cancer Sister    Stomach cancer Maternal Aunt     Social History Social History   Tobacco Use   Smoking status: Former    Current packs/day: 0.00    Average packs/day: 2.0 packs/day for 25.0 years (50.0 ttl pk-yrs)    Types: Cigarettes    Start date: 03/10/1960    Quit date: 03/10/1985    Years since quitting: 38.2   Smokeless tobacco: Never  Vaping Use   Vaping status: Never Used  Substance Use Topics   Alcohol use: Yes    Comment: socially; rarely   Drug use: No     Allergies   Patient has no known allergies.   Review of Systems Review of Systems Per HPI  Physical Exam Triage Vital Signs ED Triage Vitals  Encounter Vitals Group     BP 05/16/23 1640 (!) 181/76     Systolic BP Percentile --      Diastolic BP Percentile --      Pulse Rate 05/16/23 1640 77     Resp 05/16/23 1640 15     Temp 05/16/23 1640 98.6 F (37 C)     Temp Source 05/16/23 1640 Oral     SpO2 05/16/23 1640 93 %     Weight --      Height --      Head Circumference --      Peak Flow --      Pain Score 05/16/23 1648 0     Pain Loc --      Pain Education --      Exclude from Growth Chart --    No data found.  Updated Vital Signs BP (!) 181/76 (BP Location: Right Arm)   Pulse 77   Temp 98.6 F (37 C) (Oral)   Resp 15   SpO2 93%   Visual Acuity Right Eye Distance:   Left Eye Distance:   Bilateral Distance:    Right Eye Near:   Left Eye Near:    Bilateral Near:  Physical Exam Vitals and nursing note reviewed.  Constitutional:      General: She is not in acute distress.    Appearance: Normal appearance.  HENT:     Head: Normocephalic.     Right Ear: Tympanic membrane, ear canal and external ear normal.     Left Ear: Tympanic membrane, ear canal and external ear normal.     Nose: Congestion present.     Right Turbinates: Enlarged and swollen.     Left  Turbinates: Enlarged and swollen.     Right Sinus: No maxillary sinus tenderness or frontal sinus tenderness.     Left Sinus: No maxillary sinus tenderness or frontal sinus tenderness.     Mouth/Throat:     Lips: Pink.     Mouth: Mucous membranes are moist.     Pharynx: Oropharynx is clear. Uvula midline. Postnasal drip present. No pharyngeal swelling, oropharyngeal exudate, posterior oropharyngeal erythema or uvula swelling.  Eyes:     Extraocular Movements: Extraocular movements intact.     Conjunctiva/sclera: Conjunctivae normal.     Pupils: Pupils are equal, round, and reactive to light.  Cardiovascular:     Rate and Rhythm: Normal rate and regular rhythm.     Pulses: Normal pulses.     Heart sounds: Normal heart sounds.  Pulmonary:     Effort: Pulmonary effort is normal.     Breath sounds: Normal breath sounds.  Abdominal:     General: Bowel sounds are normal.     Palpations: Abdomen is soft.     Tenderness: There is no abdominal tenderness.  Musculoskeletal:     Cervical back: Normal range of motion.  Lymphadenopathy:     Cervical: No cervical adenopathy.  Skin:    General: Skin is warm and dry.  Neurological:     General: No focal deficit present.     Mental Status: She is alert and oriented to person, place, and time.  Psychiatric:        Mood and Affect: Mood normal.        Behavior: Behavior normal.      UC Treatments / Results  Labs (all labs ordered are listed, but only abnormal results are displayed) Labs Reviewed - No data to display  EKG   Radiology No results found.  Procedures Procedures (including critical care time)  Medications Ordered in UC Medications - No data to display  Initial Impression / Assessment and Plan / UC Course  I have reviewed the triage vital signs and the nursing notes.  Pertinent labs & imaging results that were available during my care of the patient were reviewed by me and considered in my medical decision making (see  chart for details).  On exam, lung sounds are clear throughout. Suspect cough may be of viral etiology; however, given the patient's age and duration of the cough, we will treat empirically for a lower respiratory infection. Will treat with Augmentin 875/125 mg tablets. For the cough, patient  was advised to purchase over-the-counter Coricidin HBP while cough symptoms persist. Supportive care recommendations were provided and discussed with the patient to include increasing fluids, over-the-counter Tylenol for pain or discomfort, sleeping elevated on pillows, and use of a humidifier while symptoms persist. Patient was advised if symptoms worsen to include wheezing, difficulty breathing, or new symptoms of fever, to follow-up in the emergency department. Patient is in agreement with this plan of care and verbalizes understanding. All questions were answered. Patient stable for discharge.   Final Clinical Impressions(s) /  UC Diagnoses   Final diagnoses:  Lower respiratory infection     Discharge Instructions      Take medication as prescribed.  Recommend Coricidin HBP for cough.  Continue use of your albuterol inhaler as needed. Increase fluids and allow for plenty of rest. May take over-the-counter Tylenol as needed for pain, fever, or general discomfort. Recommend normal saline nasal spray throughout the day to help with nasal congestion and runny nose. For your cough, recommend using a humidifier in your bedroom at nighttime during sleep and sleeping elevated on pillows while cough symptoms persist. If symptoms do not improve with this treatment, please follow-up with your primary care physician for further evaluation. Follow-up as needed.       ED Prescriptions     Medication Sig Dispense Auth. Provider   amoxicillin-clavulanate (AUGMENTIN) 875-125 MG tablet Take 1 tablet by mouth every 12 (twelve) hours. 14 tablet Carmie Lanpher-Warren, Sadie Haber, NP      PDMP not reviewed this  encounter.   Abran Cantor, NP 05/16/23 1743

## 2023-05-16 NOTE — Discharge Instructions (Addendum)
Take medication as prescribed.  Recommend Coricidin HBP for cough.  Continue use of your albuterol inhaler as needed. Increase fluids and allow for plenty of rest. May take over-the-counter Tylenol as needed for pain, fever, or general discomfort. Recommend normal saline nasal spray throughout the day to help with nasal congestion and runny nose. For your cough, recommend using a humidifier in your bedroom at nighttime during sleep and sleeping elevated on pillows while cough symptoms persist. If symptoms do not improve with this treatment, please follow-up with your primary care physician for further evaluation. Follow-up as needed.

## 2023-05-23 DIAGNOSIS — R809 Proteinuria, unspecified: Secondary | ICD-10-CM | POA: Diagnosis not present

## 2023-05-23 DIAGNOSIS — N189 Chronic kidney disease, unspecified: Secondary | ICD-10-CM | POA: Diagnosis not present

## 2023-05-23 DIAGNOSIS — E611 Iron deficiency: Secondary | ICD-10-CM | POA: Diagnosis not present

## 2023-05-23 DIAGNOSIS — E1129 Type 2 diabetes mellitus with other diabetic kidney complication: Secondary | ICD-10-CM | POA: Diagnosis not present

## 2023-05-23 DIAGNOSIS — I129 Hypertensive chronic kidney disease with stage 1 through stage 4 chronic kidney disease, or unspecified chronic kidney disease: Secondary | ICD-10-CM | POA: Diagnosis not present

## 2023-05-23 DIAGNOSIS — I5032 Chronic diastolic (congestive) heart failure: Secondary | ICD-10-CM | POA: Diagnosis not present

## 2023-05-23 DIAGNOSIS — N1831 Chronic kidney disease, stage 3a: Secondary | ICD-10-CM | POA: Diagnosis not present

## 2023-05-25 ENCOUNTER — Other Ambulatory Visit (HOSPITAL_BASED_OUTPATIENT_CLINIC_OR_DEPARTMENT_OTHER): Payer: Self-pay

## 2023-05-25 MED ORDER — SPIRONOLACTONE 25 MG PO TABS: 25.0000 mg | ORAL_TABLET | Freq: Every day | ORAL | 0 refills | Status: AC

## 2023-06-03 ENCOUNTER — Ambulatory Visit: Payer: Medicare PPO

## 2023-06-09 ENCOUNTER — Ambulatory Visit (INDEPENDENT_AMBULATORY_CARE_PROVIDER_SITE_OTHER): Payer: Medicare PPO | Admitting: Internal Medicine

## 2023-06-09 ENCOUNTER — Encounter: Payer: Self-pay | Admitting: Internal Medicine

## 2023-06-09 VITALS — BP 147/74 | HR 89 | Ht 60.0 in | Wt 161.0 lb

## 2023-06-09 DIAGNOSIS — N182 Chronic kidney disease, stage 2 (mild): Secondary | ICD-10-CM | POA: Diagnosis not present

## 2023-06-09 DIAGNOSIS — N1831 Chronic kidney disease, stage 3a: Secondary | ICD-10-CM

## 2023-06-09 DIAGNOSIS — E782 Mixed hyperlipidemia: Secondary | ICD-10-CM

## 2023-06-09 DIAGNOSIS — K760 Fatty (change of) liver, not elsewhere classified: Secondary | ICD-10-CM | POA: Diagnosis not present

## 2023-06-09 DIAGNOSIS — Z114 Encounter for screening for human immunodeficiency virus [HIV]: Secondary | ICD-10-CM | POA: Insufficient documentation

## 2023-06-09 DIAGNOSIS — I10 Essential (primary) hypertension: Secondary | ICD-10-CM

## 2023-06-09 DIAGNOSIS — E1122 Type 2 diabetes mellitus with diabetic chronic kidney disease: Secondary | ICD-10-CM

## 2023-06-09 DIAGNOSIS — N1832 Chronic kidney disease, stage 3b: Secondary | ICD-10-CM

## 2023-06-09 DIAGNOSIS — F322 Major depressive disorder, single episode, severe without psychotic features: Secondary | ICD-10-CM

## 2023-06-09 DIAGNOSIS — I739 Peripheral vascular disease, unspecified: Secondary | ICD-10-CM

## 2023-06-09 DIAGNOSIS — Z1329 Encounter for screening for other suspected endocrine disorder: Secondary | ICD-10-CM

## 2023-06-09 DIAGNOSIS — I2510 Atherosclerotic heart disease of native coronary artery without angina pectoris: Secondary | ICD-10-CM

## 2023-06-09 DIAGNOSIS — Z1321 Encounter for screening for nutritional disorder: Secondary | ICD-10-CM

## 2023-06-09 DIAGNOSIS — Z23 Encounter for immunization: Secondary | ICD-10-CM | POA: Insufficient documentation

## 2023-06-09 MED ORDER — DAPAGLIFLOZIN PROPANEDIOL 5 MG PO TABS
5.0000 mg | ORAL_TABLET | Freq: Every day | ORAL | 2 refills | Status: DC
Start: 2023-06-09 — End: 2023-06-23

## 2023-06-09 MED ORDER — ROSUVASTATIN CALCIUM 40 MG PO TABS
40.0000 mg | ORAL_TABLET | Freq: Every day | ORAL | 1 refills | Status: DC
Start: 2023-06-09 — End: 2023-06-23

## 2023-06-09 NOTE — Assessment & Plan Note (Signed)
Mood is stable on current regimen of fluoxetine and nortriptyline.  No changes have been made today.

## 2023-06-09 NOTE — Assessment & Plan Note (Signed)
Followed by nephrology.  Reports that she has an appointment scheduled for later this month.  Not taking Jardiance due to cost.  We will switch to [place].  Consider ARB in setting of CKD.

## 2023-06-09 NOTE — Assessment & Plan Note (Signed)
Denies recent chest pain.  Cardiology follow-up is scheduled for later this month.  She is prescribed ASA 81 mg daily and rosuvastatin.

## 2023-06-09 NOTE — Assessment & Plan Note (Signed)
A1c 7.2 in December 2023.  She is prescribed Jardiance but has not been taking it due to cost. -DC Jardiance and start Farxiga 5 mg daily. -Repeat A1c ordered today

## 2023-06-09 NOTE — Assessment & Plan Note (Signed)
Lipid panel last updated in December 2022.  Total cholesterol 115 and LDL 50.  She is currently prescribed rosuvastatin 40 mg daily.  Repeat lipid panel ordered today.

## 2023-06-09 NOTE — Assessment & Plan Note (Signed)
Repeat HIV screening ordered today per patient request due to a sexual encounter with an at risk partner 2-3 months ago.

## 2023-06-09 NOTE — Assessment & Plan Note (Signed)
She is currently prescribed amlodipine 10 mg daily, spironolactone 25 mg daily, and Toprol-XL 12.5 mg daily.  Per review of previous documentation, ACEi was discontinued due to cough/shortness of breath post COVID.  She will follow-up with nephrology next week.  Consider ARB in the setting of CKD.

## 2023-06-09 NOTE — Patient Instructions (Addendum)
It was a pleasure to see you today.  Thank you for giving [place] the opportunity to be involved in your care.  Below is a brief recap of your visit and next steps.  We will plan to see you again in 3 months.  Summary Switch from jardiance to farxiga Repeat labs ordered Flu shot today Follow up in 3 months

## 2023-06-09 NOTE — Progress Notes (Signed)
Established Patient Office Visit  Subjective   Patient ID: Nicole Horn, female    DOB: 1946/01/21  Age: 77 y.o. MRN: [id number]  Chief Complaint  Patient presents with   Diabetes    Three month follow up    URI    Has had a cold for a month , has went to urgent care , has taken antibiotics and cough syrup but is not cleared up still having a lot of congestion in chest    Diarrhea    Patient states her stools are either really loose or really firm    Nicole Horn returns to care today for routine follow up.  She was last evaluated by me on 6/7.  No medication changes were made at that time and 36-month follow-up was arranged.  ED presentation on 9/23 endorsing URI symptoms treated empirically for lower respiratory infection with Augmentin.  There have otherwise been no acute interval events. Nicole Horn reports feeling fairly well today.  She endorses gradually improving URI symptoms.  She additionally endorses irregular bowel habits, noting that some days she experiences diarrhea and other day stools are firm.  She does not have any acute concerns to discuss.  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   Type 2 diabetes mellitus (HCC)    Past Surgical History:  Procedure Laterality Date   ABDOMINAL HYSTERECTOMY     BACK SURGERY     cervical fuision   BARTHOLIN GLAND CYST EXCISION Left 03/04/2015   Procedure: EXCISION OF LEFT  BARTHOLINS TUMOR;  Surgeon: Tilda Burrow, MD;  Location: [facility];  Service: Gynecology;  Laterality: Left;   CATARACT EXTRACTION W/PHACO Right 03/27/2021   Procedure: CATARACT EXTRACTION PHACO AND INTRAOCULAR LENS PLACEMENT (IOC);  Surgeon: Fabio Pierce, MD;  Location: [facility];  Service: Ophthalmology;  Laterality: Right;  cde 8.45   CATARACT EXTRACTION W/PHACO Left 11/06/2021   Procedure: CATARACT EXTRACTION PHACO AND INTRAOCULAR LENS PLACEMENT (IOC);  Surgeon: Fabio Pierce, MD;  Location: [facility];  Service: Ophthalmology;  Laterality: Left;  CDE:13.69   CERVICAL SPINE SURGERY     COLONOSCOPY N/A 02/01/2020   Procedure: COLONOSCOPY;  Surgeon: Corbin Ade, MD; Scattered medium mouth diverticula in the sigmoid and descending colon, otherwise normal exam.   CYST REMOVAL TRUNK     ESOPHAGOGASTRODUODENOSCOPY (EGD) WITH PROPOFOL N/A 03/27/2020   Surgeon: Corbin Ade, MD;  normal esophagus and stomach, 2 small duodenal erosions.   GIVENS CAPSULE STUDY N/A 03/27/2020   Surgeon: Corbin Ade, MD;  multiple small bowel angiectasia's   Multiple cyst removal surgeries     Stent of lower extremities     Social History   Tobacco Use   Smoking status: Former    Current packs/day: 0.00    Average packs/day: 2.0 packs/day for 25.0 years (50.0 ttl pk-yrs)    Types: Cigarettes    Start date: 03/10/1960  Quit date: 03/10/1985    Years since quitting: 38.2   Smokeless tobacco: Never  Vaping Use   Vaping status: Never Used  Substance Use Topics   Alcohol use: Yes    Comment: socially; rarely   Drug use: No   Family History  Problem Relation Age of Onset   Stroke Paternal Grandmother    Heart attack Paternal Grandmother    Colon cancer Mother        In her 23s   Healthy Brother    Breast cancer Sister    Stomach cancer Maternal Aunt    No Known Allergies  Review of Systems  Constitutional:  Negative for chills and fever.  HENT:  Positive for congestion. Negative for sore  throat.   Respiratory:  Negative for cough and shortness of breath.   Cardiovascular:  Negative for chest pain, palpitations and leg swelling.  Gastrointestinal:  Positive for constipation and diarrhea. Negative for abdominal pain, blood in stool, nausea and vomiting.  Genitourinary:  Negative for dysuria and hematuria.  Musculoskeletal:  Negative for myalgias.  Skin:  Negative for itching and rash.  Neurological:  Negative for dizziness and headaches.  Psychiatric/Behavioral:  Negative for depression and suicidal ideas.      Objective:     BP (!) 147/74 (BP Location: Right Arm, Patient Position: Sitting, Cuff Size: Normal)   Pulse 89   Ht 5' (1.524 m)   Wt 161 lb (73 kg)   SpO2 92%   BMI 31.44 kg/m  BP Readings from Last 3 Encounters:  06/09/23 (!) 147/74  05/16/23 (!) 181/76  01/28/23 (!) 112/57   Physical Exam Vitals reviewed.  Constitutional:      General: She is not in acute distress.    Appearance: Normal appearance. She is not toxic-appearing.  HENT:     Head: Normocephalic and atraumatic.     Right Ear: External ear normal.     Left Ear: External ear normal.     Nose: Nose normal. No congestion or rhinorrhea.     Mouth/Throat:     Mouth: Mucous membranes are moist.     Pharynx: Oropharynx is clear. No oropharyngeal exudate or posterior oropharyngeal erythema.  Eyes:     General: No scleral icterus.    Extraocular Movements: Extraocular movements intact.     Conjunctiva/sclera: Conjunctivae normal.     Pupils: Pupils are equal, round, and reactive to light.  Cardiovascular:     Rate and Rhythm: Normal rate and regular rhythm.     Pulses: Normal pulses.     Heart sounds: Normal heart sounds. No murmur heard.    No friction rub. No gallop.  Pulmonary:     Effort: Pulmonary effort is normal.     Breath sounds: Normal breath sounds. No wheezing, rhonchi or rales.  Abdominal:     General: Abdomen is flat. Bowel sounds are normal. There is no distension.      Palpations: Abdomen is soft.     Tenderness: There is no abdominal tenderness.  Musculoskeletal:        General: No swelling. Normal range of motion.     Cervical back: Normal range of motion.     Right lower leg: No edema.     Left lower leg: No edema.  Lymphadenopathy:     Cervical: No cervical adenopathy.  Skin:    General: Skin is warm and dry.     Capillary Refill: Capillary refill takes less than 2 seconds.     Coloration: Skin is  not jaundiced.  Neurological:     General: No focal deficit present.     Mental Status: She is alert and oriented to person, place, and time.  Psychiatric:        Mood and Affect: Mood normal.        Behavior: Behavior normal.   Last CBC Lab Results  Component Value Date   WBC 3.3 (L) 08/31/2022   HGB 14.4 08/31/2022   HCT 47.0 (H) 08/31/2022   MCV 84.8 08/31/2022   MCH 26.0 08/31/2022   RDW 14.6 08/31/2022   PLT 252 08/31/2022   Last metabolic panel Lab Results  Component Value Date   GLUCOSE 110 (H) 08/31/2022   NA 140 08/31/2022   K 3.9 08/31/2022   CL 105 08/31/2022   CO2 29 08/31/2022   BUN 16 08/31/2022   CREATININE 1.07 (H) 08/31/2022   GFRNONAA 54 (L) 08/31/2022   CALCIUM 9.7 08/31/2022   PROT 7.5 08/31/2022   ALBUMIN 4.0 08/31/2022   LABGLOB 2.6 05/13/2021   AGRATIO 1.7 05/13/2021   BILITOT 0.4 08/31/2022   ALKPHOS 93 08/31/2022   AST 24 08/31/2022   ALT 29 08/31/2022   ANIONGAP 6 08/31/2022   Last lipids Lab Results  Component Value Date   CHOL 115 08/10/2021   HDL 47 (L) 08/10/2021   LDLCALC 50 08/10/2021   TRIG 99 08/10/2021   CHOLHDL 2.4 08/10/2021   Last hemoglobin A1c Lab Results  Component Value Date   HGBA1C 7.2 (H) 07/23/2022   Last vitamin D Lab Results  Component Value Date   VD25OH 34.12 04/24/2020   Last vitamin B12 and Folate Lab Results  Component Value Date   VITAMINB12 328 06/06/2020   FOLATE 32.2 06/06/2020     Assessment & Plan:   Problem List Items Addressed This Visit        Essential hypertension - Primary    She is currently prescribed amlodipine 10 mg daily, spironolactone 25 mg daily, and Toprol-XL 12.5 mg daily.  Per review of previous documentation, ACEi was discontinued due to cough/shortness of breath post COVID.  She will follow-up with nephrology next week.  Consider ARB in the setting of CKD.      CAD (coronary artery disease)    Denies recent chest pain.  Cardiology follow-up is scheduled for later this month.  She is prescribed ASA 81 mg daily and rosuvastatin.      Type 2 diabetes mellitus with stage 2 chronic kidney disease, without long-term current use of insulin (HCC)    A1c 7.2 in December 2023.  She is prescribed Jardiance but has not been taking it due to cost. -DC Jardiance and start Farxiga 5 mg daily. -Repeat A1c ordered today      CKD stage 3a, GFR 45-59 ml/min (HCC)    Followed by nephrology.  Reports that she has an appointment scheduled for later this month.  Not taking Jardiance due to cost.  We will switch to [place].  Consider ARB in setting of CKD.      Hyperlipemia    Lipid panel last updated in December 2022.  Total cholesterol 115 and LDL 50.  She is currently prescribed rosuvastatin 40 mg daily.  Repeat lipid panel ordered today.      Depression, major, single episode, severe (HCC)    Mood is stable on current regimen of fluoxetine and nortriptyline.  No changes have been made today.      Screening for HIV (human immunodeficiency virus)    Repeat HIV  screening ordered today per patient request due to a sexual encounter with an at risk partner 2-3 months ago.      Need for influenza vaccination    Influenza vaccine administered today      Return in about 3 months (around 09/09/2023).   Billie Lade, MD

## 2023-06-09 NOTE — Assessment & Plan Note (Signed)
Influenza vaccine administered today.

## 2023-06-10 LAB — CBC WITH DIFFERENTIAL/PLATELET
Basophils Absolute: 0.1 10*3/uL (ref 0.0–0.2)
Basos: 2 %
EOS (ABSOLUTE): 0.3 10*3/uL (ref 0.0–0.4)
Eos: 5 %
Hematocrit: 41.9 % (ref 34.0–46.6)
Hemoglobin: 13.5 g/dL (ref 11.1–15.9)
Immature Grans (Abs): 0 10*3/uL (ref 0.0–0.1)
Immature Granulocytes: 0 %
Lymphocytes Absolute: 1.9 10*3/uL (ref 0.7–3.1)
Lymphs: 39 %
MCH: 27.3 pg (ref 26.6–33.0)
MCHC: 32.2 g/dL (ref 31.5–35.7)
MCV: 85 fL (ref 79–97)
Monocytes Absolute: 0.5 10*3/uL (ref 0.1–0.9)
Monocytes: 11 %
Neutrophils Absolute: 2.1 10*3/uL (ref 1.4–7.0)
Neutrophils: 43 %
Platelets: 315 10*3/uL (ref 150–450)
RBC: 4.95 x10E6/uL (ref 3.77–5.28)
RDW: 13.3 % (ref 11.7–15.4)
WBC: 4.8 10*3/uL (ref 3.4–10.8)

## 2023-06-10 LAB — CMP14+EGFR
ALT: 29 [IU]/L (ref 0–32)
AST: 29 [IU]/L (ref 0–40)
Albumin: 4.3 g/dL (ref 3.8–4.8)
Alkaline Phosphatase: 96 [IU]/L (ref 44–121)
BUN/Creatinine Ratio: 19 (ref 12–28)
BUN: 22 mg/dL (ref 8–27)
Bilirubin Total: 0.3 mg/dL (ref 0.0–1.2)
CO2: 22 mmol/L (ref 20–29)
Calcium: 9.6 mg/dL (ref 8.7–10.3)
Chloride: 107 mmol/L — ABNORMAL HIGH (ref 96–106)
Creatinine, Ser: 1.18 mg/dL — ABNORMAL HIGH (ref 0.57–1.00)
Globulin, Total: 3 g/dL (ref 1.5–4.5)
Glucose: 132 mg/dL — ABNORMAL HIGH (ref 70–99)
Potassium: 4.3 mmol/L (ref 3.5–5.2)
Sodium: 146 mmol/L — ABNORMAL HIGH (ref 134–144)
Total Protein: 7.3 g/dL (ref 6.0–8.5)
eGFR: 48 mL/min/{1.73_m2} — ABNORMAL LOW (ref >59)

## 2023-06-10 LAB — LIPID PANEL
Chol/HDL Ratio: 5.7 {ratio} — ABNORMAL HIGH (ref 0.0–4.4)
Cholesterol, Total: 269 mg/dL — ABNORMAL HIGH (ref 100–199)
HDL: 47 mg/dL (ref >39)
LDL Chol Calc (NIH): 187 mg/dL — ABNORMAL HIGH (ref 0–99)
Triglycerides: 184 mg/dL — ABNORMAL HIGH (ref 0–149)
VLDL Cholesterol Cal: 35 mg/dL (ref 5–40)

## 2023-06-10 LAB — HEMOGLOBIN A1C
Est. average glucose Bld gHb Est-mCnc: 160 mg/dL
Hgb A1c MFr Bld: 7.2 % — ABNORMAL HIGH (ref 4.8–5.6)

## 2023-06-10 LAB — TSH+FREE T4
Free T4: 0.92 ng/dL (ref 0.82–1.77)
TSH: 0.666 u[IU]/mL (ref 0.450–4.500)

## 2023-06-10 LAB — B12 AND FOLATE PANEL
Folate: >20 ng/mL (ref >3.0)
Vitamin B-12: 606 pg/mL (ref 232–1245)

## 2023-06-10 LAB — HIV ANTIBODY (ROUTINE TESTING W REFLEX): HIV Screen 4th Generation wRfx: NONREACTIVE

## 2023-06-10 LAB — VITAMIN D 25 HYDROXY (VIT D DEFICIENCY, FRACTURES): Vit D, 25-Hydroxy: 37.4 ng/mL (ref 30.0–100.0)

## 2023-06-17 ENCOUNTER — Ambulatory Visit: Payer: Medicare PPO | Admitting: Student

## 2023-06-17 NOTE — Progress Notes (Deleted)
Cardiology Office Note    Date:  06/17/2023  ID:  Nicole, Horn 05/23/1946, MRN [id number] Cardiologist: Nona Dell, MD    History of Present Illness:    Nicole Horn is a 76 y.o. female  with past medical history of HTN, HLD, palpitations (brief SVT by prior monitor in 2022), history of chest pain (reported MI in 2004 and underwent cath with no intervention and details unavailable), Type 2 DM, Stage 3 CKD and PAD (s/p PTCA and stenting in 2019 in [place]) who presents to the office today for 60-month follow-up.   She was last examined by Dr. Diona Browner in 07/2022 and was recovering from an upper respiratory infection. She did report intermittent dizziness and had stopped her blood pressure medications. She was encouraged to follow BP at home as he may need to resume Amlodipine or Aldactone. She was continued on Toprol-XL 12.5 mg daily as she had been on this for palpitations.  - taking statin?  ROS: ***  Studies Reviewed:   EKG: EKG is*** ordered today and demonstrates ***   EKG Interpretation Date/Time:    Ventricular Rate:    PR Interval:    QRS Duration:    QT Interval:    QTC Calculation:   R Axis:      Text Interpretation:         Echocardiogram: 02/2020 IMPRESSIONS     1. Left ventricular ejection fraction, by estimation, is 60 to 65%. The  left ventricle has normal function. The left ventricle has no regional  wall motion abnormalities. There is mild left ventricular hypertrophy.  Left ventricular diastolic parameters  are consistent with Grade I diastolic dysfunction (impaired relaxation).   2. Right ventricular systolic function is normal. The right ventricular  size is normal. There is normal pulmonary artery systolic pressure.   3. Right atrial size was mildly dilated.   4. The mitral valve is normal in structure. No evidence of mitral valve  regurgitation. No evidence of mitral stenosis.   5. The aortic valve is tricuspid.  Aortic valve regurgitation is not  visualized. No aortic stenosis is present.   6. The inferior vena cava is normal in size with greater than 50%  respiratory variability, suggesting right atrial pressure of 3 mmHg.   Event Monitor: 01/2021 ZIO XT reviewed. 8 days, 11 hours analyzed. Predominant rhythm is sinus with heart rate ranging from 48 bpm up to 118 bpm and average heart rate 76 bpm. There were rare PACs including couplets and triplets representing less than 1% total beats. Rare PVCs were noted representing less than 1% total beats. There were 2 very brief episodes of SVT, the longest of which was only 5 beats. No sustained arrhythmias or pauses.   Risk Assessment/Calculations:   {Does this patient have ATRIAL FIBRILLATION?:[phone]} No BP recorded.  {Refresh Note OR Click here to enter BP  :1}***         Physical Exam:   VS:  There were no vitals taken for this visit.   Wt Readings from Last 3 Encounters:  06/09/23 161 lb (73 kg)  01/28/23 161 lb 9.6 oz (73.3 kg)  01/20/23 161 lb (73 kg)     GEN: Well nourished, well developed in no acute distress NECK: No JVD; No carotid bruits CARDIAC: ***RRR, no murmurs, rubs, gallops RESPIRATORY:  Clear to auscultation without rales, wheezing or rhonchi  ABDOMEN: Appears non-distended. No obvious abdominal masses. EXTREMITIES: No clubbing or cyanosis. No edema.  Distal pedal pulses are  2+ bilaterally.   Assessment and Plan:   1. Palpitations - Prior monitor in 01/2021 did show rare PAC's and PVC's with brief SVT.   2. CAD - She has a history of reported MI as discussed above but did not require intervention at that time. ***   3. HTN - ***   4. HLD - Followed by her PCP.  FLP in 2023 showed her LDL was at 51 but this was elevated to 187 when checked earlier this month. ***   5. PAD - ***   6. Stage 3 CKD - Followed by Dr. Wolfgang Phoenix. Creatinine was at 1.62 when checked in 11/2022 but improved to 1.18 by repeat labs on  06/09/2023.     Signed, Ellsworth Lennox, PA-C

## 2023-06-22 LAB — PROTEIN / CREATININE RATIO, URINE: Creatinine, Urine: 18

## 2023-06-23 ENCOUNTER — Other Ambulatory Visit: Payer: Self-pay

## 2023-06-23 ENCOUNTER — Encounter: Payer: Self-pay | Admitting: Cardiology

## 2023-06-23 ENCOUNTER — Telehealth: Payer: Self-pay | Admitting: Internal Medicine

## 2023-06-23 ENCOUNTER — Ambulatory Visit: Payer: Medicare PPO | Attending: Cardiology | Admitting: Cardiology

## 2023-06-23 VITALS — BP 140/80 | HR 79 | Ht 60.0 in | Wt 167.0 lb

## 2023-06-23 DIAGNOSIS — I2510 Atherosclerotic heart disease of native coronary artery without angina pectoris: Secondary | ICD-10-CM

## 2023-06-23 DIAGNOSIS — R809 Proteinuria, unspecified: Secondary | ICD-10-CM | POA: Diagnosis not present

## 2023-06-23 DIAGNOSIS — N1832 Chronic kidney disease, stage 3b: Secondary | ICD-10-CM

## 2023-06-23 DIAGNOSIS — N1831 Chronic kidney disease, stage 3a: Secondary | ICD-10-CM | POA: Diagnosis not present

## 2023-06-23 DIAGNOSIS — I10 Essential (primary) hypertension: Secondary | ICD-10-CM | POA: Diagnosis not present

## 2023-06-23 DIAGNOSIS — E782 Mixed hyperlipidemia: Secondary | ICD-10-CM

## 2023-06-23 DIAGNOSIS — R002 Palpitations: Secondary | ICD-10-CM

## 2023-06-23 DIAGNOSIS — I25119 Atherosclerotic heart disease of native coronary artery with unspecified angina pectoris: Secondary | ICD-10-CM | POA: Diagnosis not present

## 2023-06-23 DIAGNOSIS — I129 Hypertensive chronic kidney disease with stage 1 through stage 4 chronic kidney disease, or unspecified chronic kidney disease: Secondary | ICD-10-CM | POA: Diagnosis not present

## 2023-06-23 DIAGNOSIS — E1129 Type 2 diabetes mellitus with other diabetic kidney complication: Secondary | ICD-10-CM | POA: Diagnosis not present

## 2023-06-23 MED ORDER — DAPAGLIFLOZIN PROPANEDIOL 5 MG PO TABS: 5.0000 mg | ORAL_TABLET | Freq: Every day | ORAL | 1 refills | Status: DC

## 2023-06-23 MED ORDER — ROSUVASTATIN CALCIUM 40 MG PO TABS: 40.0000 mg | ORAL_TABLET | Freq: Every day | ORAL | 1 refills | Status: AC

## 2023-06-23 NOTE — Telephone Encounter (Signed)
Refilled and corrected pharmacy

## 2023-06-23 NOTE — Telephone Encounter (Signed)
Patient called said she needs her medication resent to the correct pharmacy [facility].  Update pharmacy for future to [facility].

## 2023-06-23 NOTE — Progress Notes (Signed)
Cardiology Office Note  Date: 06/23/2023   ID: Aynslee, Venus 1946-04-11, MRN [id number]  History of Present Illness: Nicole Horn is a 77 y.o. female last seen in December 2023.  She is here for a follow-up visit.  Reports no angina, stable NYHA class II dyspnea.  Her 58 year old mother is living with her at present.  She has not been exercising regularly.  I reviewed her medications.  Current cardiac regimen includes aspirin, Norvasc, Farxiga, Toprol-XL, Aldactone, and Crestor.  She states that she had not been taking Crestor regularly.  She had recent lab work per PCP with LDL 187 up from 50 previously.  We discussed importance of resuming therapy.  She does not report any obvious intolerances.  Physical Exam: VS:  BP (!) 140/80 (BP Location: Right Arm, Patient Position: Sitting, Cuff Size: Normal)   Pulse 79   Ht 5' (1.524 m)   Wt 167 lb (75.8 kg)   SpO2 96%   BMI 32.61 kg/m , BMI Body mass index is 32.61 kg/m.  Wt Readings from Last 3 Encounters:  06/23/23 167 lb (75.8 kg)  06/09/23 161 lb (73 kg)  01/28/23 161 lb 9.6 oz (73.3 kg)    General: Patient appears comfortable at rest. HEENT: Conjunctiva and lids normal. Neck: Supple, no elevated JVP or carotid bruits. Lungs: Clear to auscultation, nonlabored breathing at rest. Cardiac: Regular rate and rhythm, no S3, 1/6 systolic murmur. Extremities: No pitting edema.  ECG:  An ECG dated 10/06/2022 was personally reviewed today and demonstrated:  Sinus rhythm with nonspecific ST changes.  Labwork: 06/09/2023: ALT 29; AST 29; BUN 22; Creatinine, Ser 1.18; Hemoglobin 13.5; Platelets 315; Potassium 4.3; Sodium 146; TSH 0.666     Component Value Date/Time   CHOL 269 (H) 06/09/2023 1122   TRIG 184 (H) 06/09/2023 1122   HDL 47 06/09/2023 1122   CHOLHDL 5.7 (H) 06/09/2023 1122   CHOLHDL 2.4 08/10/2021 1218   LDLCALC 187 (H) 06/09/2023 1122   LDLCALC 50 08/10/2021 1218   Other Studies Reviewed  Today:  No interval cardiac testing for review today.  Assessment and Plan:  1.  Primary hypertension.  Recent visit noted with PCP, would continue with current therapy including Norvasc, Toprol-XL, and Aldactone.  Discussed exercise plan as well.   2.  Intermittent palpitations with brief episodes of SVT by cardiac monitor.  She does not report any increasing symptoms.  Continue observation on Toprol-XL.   3.  CAD by history (reportedly "heart attack" with cardiac catheterization in 2004 in [place], but no PCI) with plan to continue medical therapy in the absence of angina symptoms.  Continue aspirin, Farxiga, and resume statin therapy.  4.  Mixed hyperlipidemia.  Recent LDL 187 up from 50 in the absence of regular use of Crestor 40 mg daily.  Discussed compliance with treatment.  She does not report any obvious intolerances.  Disposition:  Follow up  6 months.  Signed, Jonelle Sidle, M.D., F.A.C.C. [facility]

## 2023-06-23 NOTE — Patient Instructions (Signed)
Medication Instructions:  Your physician recommends that you continue on your current medications as directed. Please refer to the Current Medication list given to you today.   Labwork: None today  Testing/Procedures: None today  Follow-Up: 6 months  Any Other Special Instructions Will Be Listed Below (If Applicable).  If you need a refill on your cardiac medications before your next appointment, please call your pharmacy.  

## 2023-06-26 ENCOUNTER — Other Ambulatory Visit: Payer: Self-pay | Admitting: Family Medicine

## 2023-06-28 ENCOUNTER — Other Ambulatory Visit: Payer: Self-pay | Admitting: Obstetrics & Gynecology

## 2023-07-03 ENCOUNTER — Ambulatory Visit
Admission: EM | Admit: 2023-07-03 | Discharge: 2023-07-03 | Disposition: A | Discharge status: Home / Self Care | Payer: Medicare PPO | Attending: Nurse Practitioner | Admitting: Nurse Practitioner

## 2023-07-03 DIAGNOSIS — J069 Acute upper respiratory infection, unspecified: Secondary | ICD-10-CM | POA: Insufficient documentation

## 2023-07-03 DIAGNOSIS — Z1152 Encounter for screening for COVID-19: Secondary | ICD-10-CM | POA: Diagnosis not present

## 2023-07-03 MED ORDER — CETIRIZINE-PSEUDOEPHEDRINE ER 5-120 MG PO TB12: 1.0000 | ORAL_TABLET | Freq: Every day | ORAL | 0 refills | Status: AC

## 2023-07-03 MED ORDER — FLUTICASONE PROPIONATE 50 MCG/ACT NA SUSP: 2.0000 | Freq: Every day | NASAL | 0 refills | Status: DC

## 2023-07-03 NOTE — ED Provider Notes (Signed)
[facility]    CSN: [id number] Arrival date & time: 07/03/23  1239      History   Chief Complaint Chief Complaint  Patient presents with   Nasal Congestion    HPI Nicole Horn is a 77 y.o. female.   The history is provided by the patient.   The patient presents for a 4-day history of fatigue, headache, nasal congestion, and a mild cough.  Patient reports that she has had episodes of sweating.  She denies ear pain, ear drainage, wheezing, difficulty breathing, chest pain, abdominal pain, nausea, vomiting, diarrhea, or rash.  Patient reports she has been taking over-the-counter cough and cold medications with minimal relief.  Past Medical History:  Diagnosis Date   Benign tumor of Bartholin's gland 02/26/2015   Chronic thumb pain, right    Essential hypertension    Heart attack [facility]) 2004   [place] - hospitalized with stress test by report and presumably medical therapy; subsequent cardiac catheterization (no PCI)   History of COVID-19 08/2019   History of irregular heartbeat    History of syphilis    Mixed hyperlipidemia    PAD (peripheral artery disease) (HCC) 2019   PTCA/stent [place]   Pneumonia due to COVID-19 virus 09/10/2019   Sleep apnea    Pt supposed to wear CPAP, but doesn't.   Type 2 diabetes mellitus [facility])     Patient Active Problem List   Diagnosis Date Noted   Screening for HIV (human immunodeficiency virus) 06/09/2023   Need for influenza vaccination 06/09/2023   Boil of vulva 01/11/2023   Acute lumbar back pain 10/06/2022   Chest pain 10/06/2022   Hidradenitis suppurativa of right axilla 10/06/2022   Encounter for well adult exam with abnormal findings 07/23/2022   Hepatic steatosis 01/21/2022   Epistaxis 01/21/2022   Compulsive gambling 11/25/2021   Concussion with no loss of consciousness 09/22/2021   Persistent headaches 09/22/2021   Frequent headaches 09/22/2021   Prediabetes 09/22/2021   LFTs abnormal 02/06/2021    Herpes 01/02/2021   Palpitations 11/27/2020   Depression, major, single episode, severe (HCC) 10/22/2020   Insomnia 10/22/2020   Caregiver role strain 07/23/2020   Primary osteoarthritis of first carpometacarpal joint of right hand 07/16/2020   Annual visit for general adult medical examination with abnormal findings 04/16/2020   Postmenopausal 04/16/2020   Screening mammogram, encounter for 04/16/2020   Atypical mole 04/16/2020   Need for immunization against influenza 04/16/2020   Constipation 03/17/2020   PVD (peripheral vascular disease) (HCC) 03/14/2020   CAD (coronary artery disease) 03/14/2020   Iron deficiency anemia 03/14/2020   Dizziness 03/13/2020   DOE (dyspnea on exertion) 03/10/2020   H/O adenomatous polyp of colon 01/02/2020   FH: colon cancer 01/02/2020   Heart disease 12/11/2019   Upper airway cough syndrome 11/13/2019   Obesity (BMI 30-39.9) 10/16/2019   Essential hypertension 09/10/2019   Hyperlipemia 09/10/2019   OSA on CPAP 04/18/2019   PAD (peripheral artery disease) (HCC) 05/13/2017   Claudication (HCC) 02/21/2017   Type 2 diabetes mellitus with stage 2 chronic kidney disease, without long-term current use of insulin (HCC) 06/09/2015   CKD stage 3a, GFR 45-59 ml/min (HCC) 03/06/2012    Past Surgical History:  Procedure Laterality Date   ABDOMINAL HYSTERECTOMY     BACK SURGERY     cervical fuision   BARTHOLIN GLAND CYST EXCISION Left 03/04/2015   Procedure: EXCISION OF LEFT BARTHOLINS TUMOR;  Surgeon: Tilda Burrow, MD;  Location: [facility];  Service: Gynecology;  Laterality: Left;   CATARACT EXTRACTION W/PHACO Right 03/27/2021   Procedure: CATARACT EXTRACTION PHACO AND INTRAOCULAR LENS PLACEMENT (IOC);  Surgeon: Fabio Pierce, MD;  Location: [facility];  Service: Ophthalmology;  Laterality: Right;  cde 8.45   CATARACT EXTRACTION W/PHACO Left 11/06/2021   Procedure: CATARACT EXTRACTION PHACO AND INTRAOCULAR LENS PLACEMENT (IOC);  Surgeon: Fabio Pierce, MD;   Location: [facility];  Service: Ophthalmology;  Laterality: Left;  CDE:13.69   CERVICAL SPINE SURGERY     COLONOSCOPY N/A 02/01/2020   Procedure: COLONOSCOPY;  Surgeon: Corbin Ade, MD; Scattered medium mouth diverticula in the sigmoid and descending colon, otherwise normal exam.   CYST REMOVAL TRUNK     ESOPHAGOGASTRODUODENOSCOPY (EGD) WITH PROPOFOL N/A 03/27/2020   Surgeon: Corbin Ade, MD;  normal esophagus and stomach, 2 small duodenal erosions.   GIVENS CAPSULE STUDY N/A 03/27/2020   Surgeon: Corbin Ade, MD;  multiple small bowel angiectasia's   Multiple cyst removal surgeries     Stent of lower extremities      OB History     Gravida  1   Para  1   Term      Preterm  1   AB      Living  1      SAB      IAB      Ectopic      Multiple      Live Births  1            Home Medications    Prior to Admission medications   Medication Sig Start Date End Date Taking? Authorizing Provider  cetirizine-pseudoephedrine (ZYRTEC-D) 5-120 MG tablet Take 1 tablet by mouth daily. 07/03/23  Yes Leath-Warren, Sadie Haber, NP  fluticasone (FLONASE) 50 MCG/ACT nasal spray Place 2 sprays into both nostrils daily. 07/03/23  Yes Leath-Warren, Sadie Haber, NP  albuterol (VENTOLIN HFA) 108 (90 Base) MCG/ACT inhaler Inhale 1-2 puffs into the lungs every 6 (six) hours as needed for wheezing or shortness of breath.    [provider]  amLODipine (NORVASC) 10 MG tablet Take 1 tablet (10 mg total) by mouth daily. 05/06/23   Billie Lade, MD  aspirin EC 81 MG tablet Take 1 tablet (81 mg total) by mouth daily. Swallow whole. 05/06/23   Billie Lade, MD  Blood Glucose Calibration (TRUE METRIX LEVEL 1) Low SOLN 1 application by In Vitro route daily Max Daily Amount: 1 application 11/01/17   [provider]  Blood Glucose Monitoring Suppl (TRUE METRIX GO GLUCOSE METER) w/Device KIT by Does not apply route. 10/28/17   [provider]  Calcium Carbonate  Antacid (CALCIUM CARBONATE PO) Take 1,200 mg of elemental calcium by mouth in the morning.    [provider]  cholecalciferol (VITAMIN D3) 25 MCG (1000 UNIT) tablet Take 1,000 Units by mouth daily.    [provider]  dapagliflozin propanediol (FARXIGA) 5 MG TABS tablet Take 1 tablet (5 mg total) by mouth daily before breakfast. 06/23/23   Billie Lade, MD  Ferrous Sulfate (IRON PO) Take by mouth daily.    [provider]  FLUoxetine (PROZAC) 10 MG capsule TAKE 1 CAPSULE BY MOUTH EVERY DAY 06/28/23   Billie Lade, MD  metoprolol succinate (TOPROL XL) 25 MG 24 hr tablet Take 0.5 tablets (12.5 mg total) by mouth daily. 05/06/23   Jonelle Sidle, MD  Multiple Vitamin (MULTIVITAMIN WITH MINERALS) TABS tablet Take 1 tablet by mouth in the morning.  Centrum    [provider]  nortriptyline (PAMELOR) 10 MG capsule TAKE 1 CAPSULE BY MOUTH EVERYDAY AT BEDTIME 09/13/22   Rodolph Bong, MD  Probiotic Product (DIGESTIVE ADVANTAGE) CAPS Take 1 capsule by mouth daily.    [provider]  rosuvastatin (CRESTOR) 40 MG tablet Take 1 tablet (40 mg total) by mouth daily. 06/23/23   Billie Lade, MD  silver sulfADIAZINE (SILVADENE) 1 % cream Apply 1 Application topically 2 (two) times daily. Apply bid to affected area externally 01/11/23   Adline Potter, NP  spironolactone (ALDACTONE) 25 MG tablet Take 1 tablet (25 mg total) by mouth daily. Please call [phone] to schedule an appointment with Dr. Nona Dell for future refills. Thank you. 05/25/23   Jonelle Sidle, MD  valACYclovir (VALTREX) 500 MG tablet Take 1 tablet (500 mg total) by mouth daily. 05/11/23   Lazaro Arms, MD  vitamin C (ASCORBIC ACID) 500 MG tablet Take 500 mg by mouth 2 (two) times a week.    [provider]    Family History Family History  Problem Relation Age of Onset   Stroke Paternal Grandmother    Heart attack Paternal Grandmother    Colon cancer Mother         In her 85s   Healthy Brother    Breast cancer Sister    Stomach cancer Maternal Aunt     Social History Social History   Tobacco Use   Smoking status: Former    Current packs/day: 0.00    Average packs/day: 2.0 packs/day for 25.0 years (50.0 ttl pk-yrs)    Types: Cigarettes    Start date: 03/10/1960    Quit date: 03/10/1985    Years since quitting: 38.3   Smokeless tobacco: Never  Vaping Use   Vaping status: Never Used  Substance Use Topics   Alcohol use: Yes    Comment: socially; rarely   Drug use: No     Allergies   Patient has no known allergies.   Review of Systems Review of Systems Per HPI  Physical Exam Triage Vital Signs ED Triage Vitals  Encounter Vitals Group     BP 07/03/23 1246 (!) 161/79     Systolic BP Percentile --      Diastolic BP Percentile --      Pulse Rate 07/03/23 1246 93     Resp 07/03/23 1246 20     Temp 07/03/23 1248 98 F (36.7 C)     Temp Source 07/03/23 1246 Oral     SpO2 07/03/23 1246 95 %     Weight --      Height --      Head Circumference --      Peak Flow --      Pain Score 07/03/23 1249 0     Pain Loc --      Pain Education --      Exclude from Growth Chart --    No data found.  Updated Vital Signs BP (!) 161/79 (BP Location: Right Arm)   Pulse 93   Temp 98 F (36.7 C) (Oral)   Resp 20   SpO2 95%   Visual Acuity Right Eye Distance:   Left Eye Distance:   Bilateral Distance:    Right Eye Near:   Left Eye Near:    Bilateral Near:     Physical Exam Vitals and nursing note reviewed.  Constitutional:      General: She is not in acute distress.  Appearance: Normal appearance.  HENT:     Head: Normocephalic.     Right Ear: Tympanic membrane, ear canal and external ear normal.     Left Ear: Tympanic membrane, ear canal and external ear normal.     Nose: Congestion present. No rhinorrhea.     Right Turbinates: Enlarged and swollen.     Left Turbinates: Enlarged and swollen.     Right Sinus: No  maxillary sinus tenderness or frontal sinus tenderness.     Left Sinus: No maxillary sinus tenderness or frontal sinus tenderness.     Mouth/Throat:     Lips: Pink.     Mouth: Mucous membranes are moist.     Pharynx: Oropharynx is clear. Uvula midline. Postnasal drip present. No pharyngeal swelling or posterior oropharyngeal erythema.  Eyes:     Extraocular Movements: Extraocular movements intact.     Conjunctiva/sclera: Conjunctivae normal.     Pupils: Pupils are equal, round, and reactive to light.  Cardiovascular:     Rate and Rhythm: Normal rate and regular rhythm.     Pulses: Normal pulses.     Heart sounds: Normal heart sounds.  Pulmonary:     Effort: Pulmonary effort is normal. No respiratory distress.     Breath sounds: Normal breath sounds. No stridor. No wheezing, rhonchi or rales.  Abdominal:     General: Bowel sounds are normal.     Palpations: Abdomen is soft.     Tenderness: There is no abdominal tenderness.  Musculoskeletal:     Cervical back: Normal range of motion.  Skin:    General: Skin is warm and dry.  Neurological:     General: No focal deficit present.     Mental Status: She is alert and oriented to person, place, and time.  Psychiatric:        Mood and Affect: Mood normal.        Behavior: Behavior normal.      UC Treatments / Results  Labs (all labs ordered are listed, but only abnormal results are displayed) Labs Reviewed  SARS CORONAVIRUS 2 (TAT 6-24 HRS)    EKG   Radiology No results found.  Procedures Procedures (including critical care time)  Medications Ordered in UC Medications - No data to display  Initial Impression / Assessment and Plan / UC Course  I have reviewed the triage vital signs and the nursing notes.  Pertinent labs & imaging results that were available during my care of the patient were reviewed by me and considered in my medical decision making (see chart for details).  COVID test is pending.  Patient is able to  receive Paxlovid if her COVID test is positive.  Do suspect patient has a viral upper respiratory infection at this time.  Will treat patient with fluticasone 50 mcg nasal spray, and cetirizine-pseudoephedrine 5-120 mg tablets.  Patient advised to monitor her blood pressure while taking the cetirizine.  Patient advised to stop the medication if her blood pressure exceeds 180 systolically.  Supportive care recommendations were provided and discussed with the patient to include fluids, rest, over-the-counter Tylenol, and normal saline nasal spray.  Patient was advised to follow-up if symptoms have not improved over the next 7 to 10 days.  Patient is in agreement with this plan of care and verbalizes understanding.  All questions were answered.  Patient stable for discharge.  Final Clinical Impressions(s) / UC Diagnoses   Final diagnoses:  Viral upper respiratory tract infection with cough  Encounter for screening for  COVID-19     Discharge Instructions      COVID test is pending.  You will be contacted if the pending test result is abnormal.  You will also have access to the results via MyChart.  As discussed, the last day to begin treatment for COVID is 07/03/2023. Take medication as prescribed. Monitor your blood pressure while you are taking the cetirizine-pseudoephedrine.  If your blood pressure exceeds 180, stop the medication immediately. Continue over-the-counter Tylenol as needed for pain or discomfort. Normal saline nasal spray throughout the day for nasal congestion and runny nose. For your cough, recommend using a humidifier in your bedroom at nighttime during sleep and sleeping slightly elevated on pillows while symptoms persist. If your symptoms are not improving over the next several days, or if there appear to be worsening, you may follow-up in this clinic or with your primary care physician for further evaluation. Follow-up as needed.     ED Prescriptions     Medication Sig  Dispense Auth. Provider   fluticasone (FLONASE) 50 MCG/ACT nasal spray Place 2 sprays into both nostrils daily. 16 g Leath-Warren, Sadie Haber, NP   cetirizine-pseudoephedrine (ZYRTEC-D) 5-120 MG tablet Take 1 tablet by mouth daily. 30 tablet Leath-Warren, Sadie Haber, NP      PDMP not reviewed this encounter.   Abran Cantor, NP 07/03/23 1307

## 2023-07-03 NOTE — Discharge Instructions (Addendum)
COVID test is pending.  You will be contacted if the pending test result is abnormal.  You will also have access to the results via MyChart.  As discussed, the last day to begin treatment for COVID is 07/03/2023. Take medication as prescribed. Monitor your blood pressure while you are taking the cetirizine-pseudoephedrine.  If your blood pressure exceeds 180, stop the medication immediately. Continue over-the-counter Tylenol as needed for pain or discomfort. Normal saline nasal spray throughout the day for nasal congestion and runny nose. For your cough, recommend using a humidifier in your bedroom at nighttime during sleep and sleeping slightly elevated on pillows while symptoms persist. If your symptoms are not improving over the next several days, or if there appear to be worsening, you may follow-up in this clinic or with your primary care physician for further evaluation. Follow-up as needed.

## 2023-07-03 NOTE — ED Triage Notes (Signed)
Pt reports nasal and head congestion, headache, feels weak, mucus x 4 days

## 2023-07-04 LAB — SARS CORONAVIRUS 2 (TAT 6-24 HRS): SARS Coronavirus 2: NEGATIVE

## 2023-07-18 ENCOUNTER — Ambulatory Visit: Payer: Medicare PPO | Admitting: Cardiology

## 2023-09-09 ENCOUNTER — Encounter: Payer: Self-pay | Admitting: Internal Medicine

## 2023-09-09 ENCOUNTER — Ambulatory Visit (INDEPENDENT_AMBULATORY_CARE_PROVIDER_SITE_OTHER): Payer: Medicare PPO | Admitting: Internal Medicine

## 2023-09-09 VITALS — BP 132/68 | HR 78 | Ht 60.0 in | Wt 164.2 lb

## 2023-09-09 DIAGNOSIS — N1831 Chronic kidney disease, stage 3a: Secondary | ICD-10-CM | POA: Diagnosis not present

## 2023-09-09 DIAGNOSIS — I10 Essential (primary) hypertension: Secondary | ICD-10-CM | POA: Diagnosis not present

## 2023-09-09 DIAGNOSIS — N182 Chronic kidney disease, stage 2 (mild): Secondary | ICD-10-CM

## 2023-09-09 DIAGNOSIS — E1122 Type 2 diabetes mellitus with diabetic chronic kidney disease: Secondary | ICD-10-CM

## 2023-09-09 DIAGNOSIS — R053 Chronic cough: Secondary | ICD-10-CM | POA: Diagnosis not present

## 2023-09-09 DIAGNOSIS — E785 Hyperlipidemia, unspecified: Secondary | ICD-10-CM

## 2023-09-09 DIAGNOSIS — F322 Major depressive disorder, single episode, severe without psychotic features: Secondary | ICD-10-CM | POA: Diagnosis not present

## 2023-09-09 MED ORDER — BENZONATATE 100 MG PO CAPS: 100.0000 mg | ORAL_CAPSULE | Freq: Three times a day (TID) | ORAL | 0 refills | Status: DC | PRN

## 2023-09-09 NOTE — Assessment & Plan Note (Signed)
Today she endorses a persistent dry cough following URI in November.  Denies sputum production, fever/chills, shortness of breath, and dyspnea with exertion.  She has an albuterol inhaler but does not use it regularly. -Add Tessalon Perles for symptom relief -Patient was instructed return to care if symptoms worsen or fail to improve.  Would consider imaging in this event.

## 2023-09-09 NOTE — Assessment & Plan Note (Signed)
A1c 7.2 on labs from October.  Farxiga 5 mg daily was started at her last appointment.  She has not experienced any adverse side effects since starting Farxiga. -Repeat A1c at follow-up in 3 months

## 2023-09-09 NOTE — Assessment & Plan Note (Signed)
Adequately controlled on current antihypertensive regimen.  No medication changes are indicated today.

## 2023-09-09 NOTE — Patient Instructions (Signed)
It was a pleasure to see you today.  Thank you for giving [place] the opportunity to be involved in your care.  Below is a brief recap of your visit and next steps.  We will plan to see you again in 3 months.  Summary Add tessalon perles for cough relief Follow up in 3 months

## 2023-09-09 NOTE — Assessment & Plan Note (Signed)
Lipid panel poorly controlled on labs from October.  Total cholesterol 269 and LDL 187.  Previously well-controlled with rosuvastatin 40 mg daily.  The importance of compliance with rosuvastatin was reinforced in light of lab results from October and again at her cardiology appointment later that month.  She has been taking rosuvastatin as prescribed.  Repeat lipid panel at follow-up in 3 months.

## 2023-09-09 NOTE — Progress Notes (Signed)
Established Patient Office Visit  Subjective   Patient ID: Nicole Horn, female    DOB: 03/08/1946  Age: 78 y.o. MRN: [id number]  Chief Complaint  Patient presents with   Hypertension    Three month follow up    Cough    Dry cough for two months    Nicole Horn returns to care today for routine follow-up.  She was last evaluated by me in October 2024.  Jardiance was discontinued in favor of Farxiga due to cost.  No additional medication changes were made and 41-month follow-up was arranged.  In the interim, she has been seen by cardiology for follow-up.  Urgent care presentation on 11/10 endorsing URI symptoms.  There have otherwise been no acute interval events. Nicole Horn reports feeling fairly well today. She endorses a persistent dry cough since she had an upper respiratory infection last fall.  Her cough is worse at night.  She denies associated symptoms including shortness of breath, dyspnea with exertion, fever/chills, and sputum production.  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   Type 2 diabetes mellitus (HCC)    Past Surgical History:  Procedure Laterality Date   ABDOMINAL HYSTERECTOMY     BACK SURGERY     cervical fuision   BARTHOLIN GLAND CYST EXCISION Left 03/04/2015   Procedure: EXCISION OF LEFT BARTHOLINS TUMOR;  Surgeon: Tilda Burrow, MD;  Location: [facility];  Service: Gynecology;  Laterality: Left;   CATARACT  EXTRACTION W/PHACO Right 03/27/2021   Procedure: CATARACT EXTRACTION PHACO AND INTRAOCULAR LENS PLACEMENT (IOC);  Surgeon: Fabio Pierce, MD;  Location: [facility];  Service: Ophthalmology;  Laterality: Right;  cde 8.45   CATARACT EXTRACTION W/PHACO Left 11/06/2021   Procedure: CATARACT EXTRACTION PHACO AND INTRAOCULAR LENS PLACEMENT (IOC);  Surgeon: Fabio Pierce, MD;  Location: [facility];  Service: Ophthalmology;  Laterality: Left;  CDE:13.69   CERVICAL SPINE SURGERY     COLONOSCOPY N/A 02/01/2020   Procedure: COLONOSCOPY;  Surgeon: Corbin Ade, MD; Scattered medium mouth diverticula in the sigmoid and descending colon, otherwise normal exam.   CYST REMOVAL TRUNK     ESOPHAGOGASTRODUODENOSCOPY (EGD) WITH PROPOFOL N/A 03/27/2020   Surgeon: Corbin Ade, MD;  normal esophagus and stomach, 2 small duodenal erosions.   GIVENS CAPSULE STUDY N/A 03/27/2020   Surgeon: Corbin Ade, MD;  multiple small bowel angiectasia's   Multiple cyst removal surgeries     Stent of lower extremities     Social History   Tobacco Use   Smoking status: Former    Current packs/day: 0.00    Average packs/day: 2.0 packs/day for 25.0 years (50.0 ttl pk-yrs)    Types: Cigarettes    Start date: 03/10/1960    Quit date: 03/10/1985    Years since quitting: 38.5   Smokeless tobacco: Never  Vaping Use   Vaping status: Never Used  Substance Use Topics  Alcohol use: Yes    Comment: socially; rarely   Drug use: No   Family History  Problem Relation Age of Onset   Stroke Paternal Grandmother    Heart attack Paternal Grandmother    Colon cancer Mother        In her 92s   Healthy Brother    Breast cancer Sister    Stomach cancer Maternal Aunt    Not on File  Review of Systems  Respiratory:  Positive for cough. Negative for sputum production and shortness of breath.   All other systems reviewed and are negative.    Objective:     BP 132/68   Pulse 78   Ht 5' (1.524 m)   Wt 164 lb 3.2 oz (74.5 kg)    SpO2 91%   BMI 32.07 kg/m  BP Readings from Last 3 Encounters:  09/09/23 132/68  07/03/23 (!) 161/79  06/23/23 (!) 140/80   Physical Exam Vitals reviewed.  Constitutional:      General: She is not in acute distress.    Appearance: Normal appearance. She is not toxic-appearing.  HENT:     Head: Normocephalic and atraumatic.     Right Ear: External ear normal.     Left Ear: External ear normal.     Nose: Nose normal. No congestion or rhinorrhea.     Mouth/Throat:     Mouth: Mucous membranes are moist.     Pharynx: Oropharynx is clear. No oropharyngeal exudate or posterior oropharyngeal erythema.  Eyes:     General: No scleral icterus.    Extraocular Movements: Extraocular movements intact.     Conjunctiva/sclera: Conjunctivae normal.     Pupils: Pupils are equal, round, and reactive to light.  Cardiovascular:     Rate and Rhythm: Normal rate and regular rhythm.     Pulses: Normal pulses.     Heart sounds: Normal heart sounds. No murmur heard.    No friction rub. No gallop.  Pulmonary:     Effort: Pulmonary effort is normal.     Breath sounds: Normal breath sounds. No wheezing, rhonchi or rales.  Abdominal:     General: Abdomen is flat. Bowel sounds are normal. There is no distension.     Palpations: Abdomen is soft.     Tenderness: There is no abdominal tenderness.  Musculoskeletal:        General: No swelling. Normal range of motion.     Cervical back: Normal range of motion.     Right lower leg: No edema.     Left lower leg: No edema.  Lymphadenopathy:     Cervical: No cervical adenopathy.  Skin:    General: Skin is warm and dry.     Capillary Refill: Capillary refill takes less than 2 seconds.     Coloration: Skin is not jaundiced.  Neurological:     General: No focal deficit present.     Mental Status: She is alert and oriented to person, place, and time.  Psychiatric:        Mood and Affect: Mood normal.        Behavior: Behavior normal.   Last CBC Lab  Results  Component Value Date   WBC 4.8 06/09/2023   HGB 13.5 06/09/2023   HCT 41.9 06/09/2023   MCV 85 06/09/2023   MCH 27.3 06/09/2023   RDW 13.3 06/09/2023   PLT 315 06/09/2023   Last metabolic panel Lab Results  Component Value Date   GLUCOSE 132 (H) 06/09/2023  NA 146 (H) 06/09/2023   K 4.3 06/09/2023   CL 107 (H) 06/09/2023   CO2 22 06/09/2023   BUN 22 06/09/2023   CREATININE 1.18 (H) 06/09/2023   EGFR 48 (L) 06/09/2023   CALCIUM 9.6 06/09/2023   PROT 7.3 06/09/2023   ALBUMIN 4.3 06/09/2023   LABGLOB 3.0 06/09/2023   AGRATIO 1.7 05/13/2021   BILITOT 0.3 06/09/2023   ALKPHOS 96 06/09/2023   AST 29 06/09/2023   ALT 29 06/09/2023   ANIONGAP 6 08/31/2022   Last lipids Lab Results  Component Value Date   CHOL 269 (H) 06/09/2023   HDL 47 06/09/2023   LDLCALC 187 (H) 06/09/2023   TRIG 184 (H) 06/09/2023   CHOLHDL 5.7 (H) 06/09/2023   Last hemoglobin A1c Lab Results  Component Value Date   HGBA1C 7.2 (H) 06/09/2023   Last thyroid functions Lab Results  Component Value Date   TSH 0.666 06/09/2023   Last vitamin D Lab Results  Component Value Date   VD25OH 37.4 06/09/2023   Last vitamin B12 and Folate Lab Results  Component Value Date   VITAMINB12 606 06/09/2023   FOLATE >20.0 06/09/2023     Assessment & Plan:   Problem List Items Addressed This Visit       Essential hypertension   Adequately controlled on current antihypertensive regimen.  No medication changes are indicated today.      Type 2 diabetes mellitus with stage 2 chronic kidney disease, without long-term current use of insulin (HCC)   A1c 7.2 on labs from October.  Farxiga 5 mg daily was started at her last appointment.  She has not experienced any adverse side effects since starting Farxiga. -Repeat A1c at follow-up in 3 months      CKD stage 3a, GFR 45-59 ml/min (HCC)   Labs from October remain consistent with CKD 3A.  Currently on SGLT2.  Nephrology follow-up scheduled for next  month.      Hyperlipemia   Lipid panel poorly controlled on labs from October.  Total cholesterol 269 and LDL 187.  Previously well-controlled with rosuvastatin 40 mg daily.  The importance of compliance with rosuvastatin was reinforced in light of lab results from October and again at her cardiology appointment later that month.  She has been taking rosuvastatin as prescribed.  Repeat lipid panel at follow-up in 3 months.      Depression, major, single episode, severe (HCC)   Mood is stable currently.  She reports discontinuing fluoxetine and nortriptyline on her own within the last month.  She is not interested in any additional medication at this time.      Persistent dry cough - Primary   Today she endorses a persistent dry cough following URI in November.  Denies sputum production, fever/chills, shortness of breath, and dyspnea with exertion. Pulmonary exam today is unremarkable.  She has an albuterol inhaler but does not use it regularly. -Add Tessalon Perles for symptom relief -Patient was instructed return to care if symptoms worsen or fail to improve.  Would consider imaging in this event.       Return in about 3 months (around 12/08/2023).    Billie Lade, MD

## 2023-09-09 NOTE — Assessment & Plan Note (Signed)
Mood is stable currently.  She reports discontinuing fluoxetine and nortriptyline on her own within the last month.  She is not interested in any additional medication at this time.

## 2023-09-09 NOTE — Assessment & Plan Note (Signed)
Labs from October remain consistent with CKD 3A.  Currently on SGLT2.  Nephrology follow-up scheduled for next month.

## 2023-09-22 ENCOUNTER — Telehealth: Payer: Self-pay | Admitting: Internal Medicine

## 2023-09-22 NOTE — Telephone Encounter (Signed)
She was here with a pt seeing Nicole Horn and wanted me to ask if her diabetes med can be changed to jardiance. States her insurance company told her it would be a lot cheaper than farxiga. And she has taken it before with no issues. Please advise

## 2023-09-23 ENCOUNTER — Other Ambulatory Visit: Payer: Self-pay

## 2023-09-23 DIAGNOSIS — E1122 Type 2 diabetes mellitus with diabetic chronic kidney disease: Secondary | ICD-10-CM

## 2023-09-23 MED ORDER — EMPAGLIFLOZIN 10 MG PO TABS: 10.0000 mg | ORAL_TABLET | Freq: Every day | ORAL | 1 refills | Status: DC

## 2023-09-23 NOTE — Telephone Encounter (Signed)
Jardiance 10 sent to [facility]

## 2023-09-27 ENCOUNTER — Other Ambulatory Visit: Payer: Self-pay | Admitting: Family Medicine

## 2023-09-27 NOTE — Telephone Encounter (Signed)
Pt has not been seen since 2023. Rx refill no appropriate.

## 2023-11-07 ENCOUNTER — Other Ambulatory Visit: Payer: Self-pay

## 2023-11-07 DIAGNOSIS — R053 Chronic cough: Secondary | ICD-10-CM

## 2023-11-07 MED ORDER — BENZONATATE 100 MG PO CAPS: 100.0000 mg | ORAL_CAPSULE | Freq: Three times a day (TID) | ORAL | 0 refills | Status: AC | PRN

## 2023-11-07 MED ORDER — FLUTICASONE PROPIONATE 50 MCG/ACT NA SUSP: 2.0000 | Freq: Every day | NASAL | 0 refills | Status: DC

## 2023-11-09 ENCOUNTER — Other Ambulatory Visit: Payer: Self-pay | Admitting: Internal Medicine

## 2023-11-09 ENCOUNTER — Ambulatory Visit: Payer: Self-pay | Admitting: Internal Medicine

## 2023-11-09 DIAGNOSIS — U071 COVID-19: Secondary | ICD-10-CM

## 2023-11-09 MED ORDER — NIRMATRELVIR/RITONAVIR (PAXLOVID) TABLET (RENAL DOSING): 2.0000 | ORAL_TABLET | Freq: Two times a day (BID) | ORAL | 0 refills | Status: AC

## 2023-11-09 MED ORDER — NIRMATRELVIR/RITONAVIR (PAXLOVID) TABLET (RENAL DOSING): 2.0000 | ORAL_TABLET | Freq: Two times a day (BID) | ORAL | 0 refills | Status: DC

## 2023-11-09 NOTE — Addendum Note (Signed)
 Addended by: Christel Mormon E on: 11/09/2023 04:15 PM   Modules accepted: Orders

## 2023-11-09 NOTE — Telephone Encounter (Signed)
 Chief Complaint: Covid positive Symptoms: headache, cough Frequency: for a few days Pertinent Negatives: Patient denies unsure Disposition: [] ED /[] Urgent Care (no appt availability in office) / [] Appointment(In office/virtual)/ []  [place] [facility]/ [] Home Care/ [] Refused Recommended Disposition /[] [place] [facility]/ [x]  Follow-up with PCP Additional Notes: Patient called in reporting she tested Covid positive with home test yesterday. Patient states her worst symptom today is a headache today. She is also experiencing a cough. Patient states she fell in her bathroom and hit her hip, but denies any bruising or injuries, and denies hitting her head. Patient is asking if Dr. Durwin Nora can call in medication for her covid. Advised patient it is advised to be seen before medications can be called in, but patient would like to know if Dr. Durwin Nora can call anything in. If he suggests she be seen - she said to please call her. Thank you   Reason for Disposition  [1] COVID-19 infection suspected by caller or triager AND [2] mild symptoms (cough, fever, or others) AND [3] negative COVID-19 rapid test  Answer Assessment - Initial Assessment Questions 1. COVID-19 DIAGNOSIS: "How do you know that you have COVID?" (e.g., positive lab test or self-test, diagnosed by doctor or NP/PA, symptoms after exposure).     Home test last night 2. COVID-19 EXPOSURE: "Was there any known exposure to COVID before the symptoms began?" CDC Definition of close contact: within 6 feet (2 meters) for a total of 15 minutes or more over a 24-hour period.      Mother 3. ONSET: "When did the COVID-19 symptoms start?"     Started coughing a few days ago 4. WORST SYMPTOM: "What is your worst symptom?" (e.g., cough, fever, shortness of breath, muscle aches)     Headache 5. COUGH: "Do you have a cough?" If Yes, ask: "How bad is the cough?"       Yes - severe cough attacks 6. FEVER: "Do you have a fever?" If Yes, ask: "What is  your temperature, how was it measured, and when did it start?"     Hasn't checked temperature  7. RESPIRATORY STATUS: "Describe your breathing?" (e.g., normal; shortness of breath, wheezing, unable to speak)      Wheezing 8. BETTER-SAME-WORSE: "Are you getting better, staying the same or getting worse compared to yesterday?"  If getting worse, ask, "In what way?"     Worse  9. OTHER SYMPTOMS: "Do you have any other symptoms?"  (e.g., chills, fatigue, headache, loss of smell or taste, muscle pain, sore throat)     Cough, headache, fell  10. HIGH RISK DISEASE: "Do you have any chronic medical problems?" (e.g., asthma, heart or lung disease, weak immune system, obesity, etc.)       Patient has heart disease  11. VACCINE: "Have you had the COVID-19 vaccine?" If Yes, ask: "Which one, how many shots, when did you get it?"       Yes  13. O2 SATURATION MONITOR:  "Do you use an oxygen saturation monitor (pulse oximeter) at home?" If Yes, ask "What is your reading (oxygen level) today?" "What is your usual oxygen saturation reading?" (e.g., 95%)       No  Protocols used: Coronavirus (COVID-19) Diagnosed or Suspected-A-AH

## 2023-11-10 NOTE — Telephone Encounter (Signed)
 Patient advised.

## 2023-11-11 ENCOUNTER — Ambulatory Visit: Payer: Self-pay | Admitting: Internal Medicine

## 2023-11-14 ENCOUNTER — Ambulatory Visit (INDEPENDENT_AMBULATORY_CARE_PROVIDER_SITE_OTHER): Payer: Medicare PPO

## 2023-11-14 VITALS — BP 158/85 | Ht 60.0 in | Wt 162.0 lb

## 2023-11-14 DIAGNOSIS — Z0001 Encounter for general adult medical examination with abnormal findings: Secondary | ICD-10-CM | POA: Diagnosis not present

## 2023-11-14 DIAGNOSIS — Z638 Other specified problems related to primary support group: Secondary | ICD-10-CM | POA: Diagnosis not present

## 2023-11-14 DIAGNOSIS — Z780 Asymptomatic menopausal state: Secondary | ICD-10-CM | POA: Diagnosis not present

## 2023-11-14 DIAGNOSIS — E119 Type 2 diabetes mellitus without complications: Secondary | ICD-10-CM

## 2023-11-14 DIAGNOSIS — Z0000 Encounter for general adult medical examination without abnormal findings: Secondary | ICD-10-CM

## 2023-11-14 DIAGNOSIS — Z0100 Encounter for examination of eyes and vision without abnormal findings: Secondary | ICD-10-CM

## 2023-11-14 DIAGNOSIS — Z636 Dependent relative needing care at home: Secondary | ICD-10-CM

## 2023-11-14 DIAGNOSIS — Z1231 Encounter for screening mammogram for malignant neoplasm of breast: Secondary | ICD-10-CM

## 2023-11-14 DIAGNOSIS — Z789 Other specified health status: Secondary | ICD-10-CM

## 2023-11-14 NOTE — Patient Instructions (Signed)
 Nicole Horn , Thank you for taking time to come for your Medicare Wellness Visit. I appreciate your ongoing commitment to your health goals. Please review the following plan we discussed and let me know if I can assist you in the future.   Referrals/Orders/Follow-Ups/Clinician Recommendations:  Next Medicare Annual Wellness Visit:   November 14, 2024 at 1:10pm telephone visit.  A referral has been placed for you today to have an eye exam. If you do not hear from that office, please call to schedule at the number below.   [facility] [address] [place], [place] [address] Phone: [phone] An order for a mammogram and/or a bone density scan was placed for you today. Please call the number below to schedule your appt.   [facility] [address] [place], [place] [address] [phone] Make sure to wear two-piece clothing.  No lotions powders or deodorants the day of the appointment Make sure to bring picture ID and insurance card.  Bring list of medications you are currently taking including any supplements.  If you are having a bone density scan, please discontinue any medications that contain calcium at least 48 hours (2 days) prior to your scan.  Your mammogram and bone density appointment are scheduled for November 30, 2023 at 10:00 am. Please arrive by 9:30 am.   A referral has been placed for you to check and see what additional resources are available to you.   If you haven't heard from anyone within the next 7 business days, please call them and let them know a referral has been placed  Phone: [phone] Please see the caregiver resources below.  Resource PHONE NUMBER Website Description  [facility]  http://[email or web address]-317.comfortkeepers.com/home  Aging parents here in [place] want to remain in their homes as they age. Seniors living at home need help from someone they can trust, someone who cares  about them. [facility]'s mission is to provide you or your loved one with the best available in home care.  Our phone lines are staffed 24/7 at [phone], or email [place] at [email or web address] .com  [place] [facility]pite [facility] [phone] [email or web address]  [facility] Manager, have pt call her to schedule appt to do intake ppw to see if pt qualifies and they will assess amount of hours per week needed for family  Division of Services for Deaf and Hard of Hearing - [place] office [phone]         [facility] Lifespan Respite Program https://barton-williams.info/ [place] agency on aging $1-500 grants to assist families in hiring caregivers for respite.  Caregiver should have been providing care for at least 6 months before applying. We can apply as Care Guides if there are questions on form that you cannot answer follow up w/ [facility] or with PCP. Cannot receive any of these publicly funded respite options FactLocator.es.pdf        [facility] Bucker [phone] [facility] FOR [employer] PATIENTS $1-500 grants to assist families in hiring caregivers for respite.  Applications will begin again in July 2018  [facility] - [place] [phone], [address] [place], [place], [address] "[facility] offers regularly scheduled caregiver support group meetings; These sessions allow caregivers a time to connect with other caregivers and share their thoughts and concerns in a relaxed and safe environment"                 This is a list of  the screening recommended for you and due dates:  Health Maintenance  Topic Date Due   DTaP/Tdap/Td vaccine (1 - Tdap) Never done   Mammogram  05/01/2021   DEXA scan (bone density measurement)  05/01/2022   COVID-19 Vaccine (3 - Moderna risk series) 07/17/2022   Eye exam  for diabetics  07/06/2023   Complete foot exam   07/24/2023   Hemoglobin A1C  12/08/2023   Yearly kidney function blood test for diabetes  06/08/2024   Yearly kidney health urinalysis for diabetes  06/21/2024   Medicare Annual Wellness Visit  11/13/2024   Pneumonia Vaccine  Completed   Flu Shot  Completed   Hepatitis C Screening  Completed   Zoster (Shingles) Vaccine  Completed   HPV Vaccine  Aged Out   Colon Cancer Screening  Discontinued    Advanced directives: (Declined) Advance directive discussed with you today. Even though you declined this today, please call our office should you change your mind, and we can give you the proper paperwork for you to fill out.  Next Medicare Annual Wellness Visit scheduled for next year: yes  Understanding Your Risk for Falls Millions of people have serious injuries from falls each year. It is important to understand your risk of falling. Talk with your health care provider about your risk and what you can do to lower it. If you do have a serious fall, make sure to tell your provider. Falling once raises your risk of falling again. How can falls affect me? Serious injuries from falls are common. These include: Broken bones, such as hip fractures. Head injuries, such as traumatic brain injuries (TBI) or concussions. A fear of falling can cause you to avoid activities and stay at home. This can make your muscles weaker and raise your risk for a fall. What can increase my risk? There are a number of risk factors that increase your risk for falling. The more risk factors you have, the higher your risk of falling. Serious injuries from a fall happen most often to people who are older than 77 years old. Teenagers and young adults ages 64-29 are also at higher risk. Common risk factors include: Weakness in the lower body. Being generally weak or confused due to long-term (chronic) illness. Dizziness or balance problems. Poor vision. Medicines that  cause dizziness or drowsiness. These may include: Medicines for your blood pressure, heart, anxiety, insomnia, or swelling (edema). Pain medicines. Muscle relaxants. Other risk factors include: Drinking alcohol. Having had a fall in the past. Having foot pain or wearing improper footwear. Working at a dangerous job. Having any of the following in your home: Tripping hazards, such as floor clutter or loose rugs. Poor lighting. Pets. Having dementia or memory loss. What actions can I take to lower my risk of falling?     Physical activity Stay physically fit. Do strength and balance exercises. Consider taking a regular class to build strength and balance. Yoga and tai chi are good options. Vision Have your eyes checked every year and your prescription for glasses or contacts updated as needed. Shoes and walking aids Wear non-skid shoes. Wear shoes that have rubber soles and low heels. Do not wear high heels. Do not walk around the house in socks or slippers. Use a cane or walker as told by your provider. Home safety Attach secure railings on both sides of your stairs. Install grab bars for your bathtub, shower, and toilet. Use a non-skid mat in your bathtub or shower. Attach bath mats  securely with double-sided, non-slip rug tape. Use good lighting in all rooms. Keep a flashlight near your bed. Make sure there is a clear path from your bed to the bathroom. Use night-lights. Do not use throw rugs. Make sure all carpeting is taped or tacked down securely. Remove all clutter from walkways and stairways, including extension cords. Repair uneven or broken steps and floors. Avoid walking on icy or slippery surfaces. Walk on the grass instead of on icy or slick sidewalks. Use ice melter to get rid of ice on walkways in the winter. Use a cordless phone. Questions to ask your health care provider Can you help me check my risk for a fall? Do any of my medicines make me more likely to  fall? Should I take a vitamin D supplement? What exercises can I do to improve my strength and balance? Should I make an appointment to have my vision checked? Do I need a bone density test to check for weak bones (osteoporosis)? Would it help to use a cane or a walker? Where to find more information Centers for Disease Control and Prevention, STEAD[employer]: TonerPromos.no Community-Based Fall Prevention Programs: TonerPromos.no [employer] on Aging: BaseRingTones.pl Contact a health care provider if: You fall at home. You are afraid of falling at home. You feel weak, drowsy, or dizzy. This information is not intended to replace advice given to you by your health care provider. Make sure you discuss any questions you have with your health care provider. Document Revised: 04/12/2022 Document Reviewed: 04/12/2022 Elsevier Patient Education  2024 [employer].

## 2023-11-14 NOTE — Progress Notes (Signed)
 Because this visit was a virtual/telehealth visit,  certain criteria was not obtained, such a blood pressure, CBG if applicable, and timed get up and go. Any medications not marked as "taking" were not mentioned during the medication reconciliation part of the visit. Any vitals not documented were not able to be obtained due to this being a telehealth visit or patient was unable to self-report a recent blood pressure reading due to a lack of equipment at home via telehealth. Vitals that have been documented are verbally provided by the patient.   Subjective:   Nicole Horn is a 78 y.o. who presents for a Medicare Wellness preventive visit.  Visit Complete: Virtual I connected with  Nicole Horn on 11/14/23 by a audio enabled telemedicine application and verified that I am speaking with the correct person using two identifiers.  Patient Location: Home  Provider Location: Home Office  I discussed the limitations of evaluation and management by telemedicine. The patient expressed understanding and agreed to proceed.  Vital Signs: Because this visit was a virtual/telehealth visit, some criteria may be missing or patient reported. Any vitals not documented were not able to be obtained and vitals that have been documented are patient reported.  VideoDeclined- This patient declined Librarian, academic. Therefore the visit was completed with audio only.  Persons Participating in Visit: Patient.  AWV Questionnaire: No: Patient Medicare AWV questionnaire was not completed prior to this visit.  Cardiac Risk Factors include: advanced age (>3men, >53 women);diabetes mellitus;dyslipidemia;hypertension;obesity (BMI >30kg/m2);sedentary lifestyle     Objective:    Today's Vitals   11/14/23 1346  BP: (!) 158/85  Weight: 162 lb (73.5 kg)  Height: 5' (1.524 m)   Body mass index is 31.64 kg/m.     11/14/2023    1:54 PM 10/25/2022   10:51 AM  05/13/2022    1:57 PM 04/20/2022    2:44 PM 01/07/2022    3:56 PM 12/30/2021    1:29 PM 12/09/2021   11:57 AM  Advanced Directives  Does Patient Have a Medical Advance Directive? Yes Yes No No No No No  Type of Estate agent of [place];Living will Healthcare Power of [place];Living will Healthcare Power of [place];Living will      Does patient want to make changes to medical advance directive?  No - Patient declined No - Patient declined  No - Patient declined No - Patient declined   Copy of Healthcare Power of Attorney in Chart? No - copy requested No - copy requested No - copy requested   No - copy requested   Would patient like information on creating a medical advance directive?   No - Patient declined No - Patient declined No - Patient declined No - Patient declined No - Patient declined    Current Medications (verified) Outpatient Encounter Medications as of 11/14/2023  Medication Sig   albuterol (VENTOLIN HFA) 108 (90 Base) MCG/ACT inhaler Inhale 1-2 puffs into the lungs every 6 (six) hours as needed for wheezing or shortness of breath.   amLODipine (NORVASC) 10 MG tablet Take 1 tablet (10 mg total) by mouth daily.   aspirin EC 81 MG tablet Take 1 tablet (81 mg total) by mouth daily. Swallow whole.   benzonatate (TESSALON PERLES) 100 MG capsule Take 1 capsule (100 mg total) by mouth 3 (three) times daily as needed for cough.   Blood Glucose Calibration (TRUE METRIX LEVEL 1) Low SOLN 1 application by In Vitro route daily Max Daily Amount: 1  application   Blood Glucose Monitoring Suppl (TRUE METRIX GO GLUCOSE METER) w/Device KIT by Does not apply route.   Calcium Carbonate Antacid (CALCIUM CARBONATE PO) Take 1,200 mg of elemental calcium by mouth in the morning.   cetirizine-pseudoephedrine (ZYRTEC-D) 5-120 MG tablet Take 1 tablet by mouth daily.   cholecalciferol (VITAMIN D3) 25 MCG (1000 UNIT) tablet Take 1,000 Units by mouth daily.   dapagliflozin propanediol  (FARXIGA) 5 MG TABS tablet Take 1 tablet (5 mg total) by mouth daily before breakfast.   empagliflozin (JARDIANCE) 10 MG TABS tablet Take 1 tablet (10 mg total) by mouth daily before breakfast.   Ferrous Sulfate (IRON PO) Take by mouth daily.   fluticasone (FLONASE) 50 MCG/ACT nasal spray Place 2 sprays into both nostrils daily.   metoprolol succinate (TOPROL XL) 25 MG 24 hr tablet Take 0.5 tablets (12.5 mg total) by mouth daily.   Multiple Vitamin (MULTIVITAMIN WITH MINERALS) TABS tablet Take 1 tablet by mouth in the morning. Centrum   nirmatrelvir/ritonavir, renal dosing, (PAXLOVID) 10 x 150 MG & 10 x 100MG  TABS Take 2 tablets by mouth 2 (two) times daily for 5 days. (Take nirmatrelvir 150 mg one tablet twice daily for 5 days and ritonavir 100 mg one tablet twice daily for 5 days) Patient GFR is 48   Probiotic Product (DIGESTIVE ADVANTAGE) CAPS Take 1 capsule by mouth daily.   rosuvastatin (CRESTOR) 40 MG tablet Take 1 tablet (40 mg total) by mouth daily.   silver sulfADIAZINE (SILVADENE) 1 % cream Apply 1 Application topically 2 (two) times daily. Apply bid to affected area externally   spironolactone (ALDACTONE) 25 MG tablet Take 1 tablet (25 mg total) by mouth daily. Please call [phone] to schedule an appointment with Dr. Nona Dell for future refills. Thank you.   valACYclovir (VALTREX) 500 MG tablet TAKE 1 TABLET (500 MG TOTAL) BY MOUTH DAILY.   vitamin C (ASCORBIC ACID) 500 MG tablet Take 500 mg by mouth 2 (two) times a week.   No facility-administered encounter medications on file as of 11/14/2023.    Allergies (verified) Patient has no known allergies.   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   Type 2 diabetes mellitus (HCC)    Past Surgical History:  Procedure Laterality Date   ABDOMINAL HYSTERECTOMY     BACK SURGERY     cervical fuision   BARTHOLIN GLAND CYST EXCISION Left 03/04/2015   Procedure: EXCISION OF LEFT BARTHOLINS TUMOR;  Surgeon: Tilda Burrow, MD;  Location: [facility];  Service: Gynecology;  Laterality: Left;   CATARACT EXTRACTION W/PHACO Right 03/27/2021   Procedure: CATARACT EXTRACTION PHACO AND INTRAOCULAR LENS PLACEMENT (IOC);  Surgeon: Fabio Pierce, MD;  Location: [facility];  Service: Ophthalmology;  Laterality: Right;  cde 8.45   CATARACT EXTRACTION W/PHACO Left 11/06/2021   Procedure: CATARACT EXTRACTION PHACO AND INTRAOCULAR LENS PLACEMENT (IOC);  Surgeon: Fabio Pierce,  MD;  Location: [facility];  Service: Ophthalmology;  Laterality: Left;  CDE:13.69   CERVICAL SPINE SURGERY     COLONOSCOPY N/A 02/01/2020   Procedure: COLONOSCOPY;  Surgeon: Corbin Ade, MD; Scattered medium mouth diverticula in the sigmoid and descending colon, otherwise normal exam.   CYST REMOVAL TRUNK     ESOPHAGOGASTRODUODENOSCOPY (EGD) WITH PROPOFOL N/A 03/27/2020   Surgeon: Corbin Ade, MD;  normal esophagus and stomach, 2 small duodenal erosions.   GIVENS CAPSULE STUDY N/A 03/27/2020   Surgeon: Corbin Ade, MD;  multiple small bowel angiectasia's   Multiple cyst removal surgeries     Stent of lower extremities     Family History  Problem Relation Age of Onset   Stroke Paternal Grandmother    Heart attack Paternal Grandmother    Colon cancer Mother        In her 23s   Healthy Brother    Breast cancer Sister    Stomach cancer Maternal Aunt    Social History   Socioeconomic History   Marital status:  Widowed    Spouse name: Not on file   Number of children: 1   Years of education: Not on file   Highest education level: Some college, no degree  Occupational History   Not on file  Tobacco Use   Smoking status: Former    Current packs/day: 0.00    Average packs/day: 2.0 packs/day for 25.0 years (50.0 ttl pk-yrs)    Types: Cigarettes    Start date: 03/10/1960    Quit date: 03/10/1985    Years since quitting: 38.7   Smokeless tobacco: Never  Vaping Use   Vaping status: Never Used  Substance and Sexual Activity   Alcohol use: Yes    Comment: socially; rarely   Drug use: No   Sexual activity: Not Currently    Birth control/protection: Surgical    Comment: hyst  Other Topics Concern   Not on file  Social History Narrative   Lives in [place] months here and there owns an apt in [place]   Lives alone, but helps to care for mother who is 92 years (goes between the kids)   Sister is in [place]   Brother is in [place]      Son who is 71 years old lives in [place], [place]    Has 6 grand kids and 7 great grands      Enjoys exercise (harder since COVID)-dancing      Diet: eats all foods-enjoys smoothies, avoids fried foods, limited red meat if any   Caffeine: coffee 1 cup in the morning-sometimes will have 1 in evening    Water: 3-4 bottles daily       Wears seat belt   Smoke detectors in home   Does not use phone while driving            Social Drivers of Health   Financial Resource Strain: Low Risk  (10/25/2022)   Overall Financial Resource Strain (CARDIA)    Difficulty of Paying Living Expenses: Not hard at all  Food Insecurity: No Food Insecurity (10/25/2022)   Hunger Vital Sign    Worried About Running Out of Food in the Last Year: Never true    Ran Out of Food in the Last Year: Never true  Transportation Needs: No Transportation Needs (10/25/2022)   PRAPARE - Administrator, Civil Service (Medical): No    Lack of Transportation  (Non-Medical): No  Physical  Activity: Inactive (11/14/2023)   Exercise Vital Sign    Days of Exercise per Week: 0 days    Minutes of Exercise per Session: 0 min  Stress: Stress Concern Present (10/25/2022)   [employer] of Occupational Health - Occupational Stress Questionnaire    Feeling of Stress : To some extent  Social Connections: Moderately Integrated (10/25/2022)   Social Connection and Isolation Panel [NHANES]    Frequency of Communication with Friends and Family: More than three times a week    Frequency of Social Gatherings with Friends and Family: More than three times a week    Attends Religious Services: More than 4 times per year    Active Member of [employer] or Organizations: Yes    Attends Engineer, structural: More than 4 times per year    Marital Status: Separated    Tobacco Counseling Counseling given: Yes    Clinical Intake:  Pre-visit preparation completed: Yes  Pain : No/denies pain     BMI - recorded: 31.64 Nutritional Status: BMI > 30  Obese Nutritional Risks: None Diabetes: Yes CBG done?: No Did pt. bring in CBG monitor from home?: No  Lab Results  Component Value Date   HGBA1C 7.2 (H) 06/09/2023   HGBA1C 7.2 (H) 07/23/2022   HGBA1C 5.7 (H) 12/12/2019     How often do you need to have someone help you when you read instructions, pamphlets, or other written materials from your doctor or pharmacy?: 1 - Never  Interpreter Needed?: No      Activities of Daily Living     11/14/2023    1:55 PM  In your present state of health, do you have any difficulty performing the following activities:  Hearing? 0  Vision? 0  Difficulty concentrating or making decisions? 0  Walking or climbing stairs? 0  Dressing or bathing? 0  Doing errands, shopping? 0  Preparing Food and eating ? N  Using the Toilet? N  In the past six months, have you accidently leaked urine? Y  Do you have problems with loss of bowel control? N  Managing your  Medications? N  Managing your Finances? N  Housekeeping or managing your Housekeeping? N    Patient Care Team: Billie Lade, MD as PCP - General (Internal Medicine) Jonelle Sidle, MD as PCP - Cardiology (Cardiology) Jena Gauss Gerrit Friends, MD as Consulting Physician (Gastroenterology) Randa Spike Kelton Pillar, LCSW as [facility] Care Management (Licensed Clinical Social Worker)  Indicate any recent Medical Services you may have received from other than [facility] providers in the past year (date may be approximate).     Assessment:   This is a routine wellness examination for [place].  Hearing/Vision screen Hearing Screening - Comments:: Patient denies any hearing difficulties.   Vision Screening - Comments:: Wears rx glasses - up to date with routine eye exams  Patient sees Dr. Daisy Lazar w/ [facility] office.     Goals Addressed             This Visit's Progress    Patient Stated       I want to take a trip       Depression Screen     09/09/2023   11:14 AM 06/09/2023   10:30 AM 01/28/2023    3:48 PM 01/20/2023   12:14 PM 10/25/2022   11:02 AM 10/25/2022   10:51 AM 10/25/2022   10:49 AM  PHQ 2/9 Scores  PHQ - 2 Score 0 0 0  0 0 0 0  PHQ- 9 Score   3  2 2 2     Fall Risk     11/14/2023    1:53 PM 09/09/2023   11:14 AM 06/09/2023   10:30 AM 01/28/2023    3:48 PM 01/11/2023   10:56 AM  Fall Risk   Falls in the past year? 1 0 0 0 0  Number falls in past yr: 0 0 0 0 0  Injury with Fall? 0 0 0 0 0  Risk for fall due to : No Fall Risks;History of fall(s);Impaired balance/gait;Impaired mobility No Fall Risks No Fall Risks No Fall Risks   Follow up Falls prevention discussed;Education provided Falls evaluation completed Falls evaluation completed Falls evaluation completed     MEDICARE RISK AT HOME:  Medicare Risk at Home Any stairs in or around the home?: Yes If so, are there any without handrails?: No Home free of loose throw rugs in walkways, pet  beds, electrical cords, etc?: Yes Adequate lighting in your home to reduce risk of falls?: Yes Life alert?: No Use of a cane, walker or w/c?: No Grab bars in the bathroom?: No Shower chair or bench in shower?: No Elevated toilet seat or a handicapped toilet?: No  TIMED UP AND GO:  Was the test performed?  No  Cognitive Function: 6CIT completed    10/25/2022   10:52 AM 07/23/2020    4:10 PM  MMSE - Mini Mental State Exam  Not completed: Unable to complete   Orientation to time  4  Orientation to Place  5  Registration  3  Attention/ Calculation  5  Recall  3  Language- name 2 objects  2  Language- repeat  1  Language- follow 3 step command  3  Language- read & follow direction  1  Write a sentence  1  Copy design  1  Total score  29        11/14/2023    1:52 PM 10/25/2022   10:52 AM 10/20/2021   12:00 PM  6CIT Screen  What Year? 0 points 0 points 0 points  What month? 0 points 0 points 0 points  What time? 0 points 0 points 0 points  Count back from 20 0 points 0 points   Months in reverse 0 points 0 points   Repeat phrase 0 points 0 points   Total Score 0 points 0 points     Immunizations Immunization History  Administered Date(s) Administered   Fluad Quad(high Dose 65+) 06/19/2022   Fluad Trivalent(High Dose 65+) 06/09/2023   Influenza Split 07/06/2010   Influenza, High Dose Seasonal PF 06/09/2015, 06/23/2016, 07/30/2019   Influenza, Seasonal, Injecte, Preservative Fre 07/06/2010   Influenza,inj,Quad PF,6+ Mos 04/16/2020   Influenza-Unspecified 06/15/2013, 06/09/2015, 06/23/2016, 06/19/2019, 07/30/2019, 07/08/2021   Moderna SARS-COV2 Booster Vaccination 07/08/2021, 06/19/2022   Moderna Sars-Covid-2 Vaccination 11/02/2019, 12/05/2019   PNEUMOCOCCAL CONJUGATE-20 07/23/2022, 03/29/2023   Pneumococcal Conjugate-13 06/23/2016   Pneumococcal Polysaccharide-23 10/19/2017   Zoster Recombinant(Shingrix) 12/05/2021, 03/29/2023   Zoster, Live 10/19/2017    Screening  Tests Health Maintenance  Topic Date Due   DTaP/Tdap/Td (1 - Tdap) Never done   COVID-19 Vaccine (3 - Moderna risk series) 07/17/2022   OPHTHALMOLOGY EXAM  07/06/2023   FOOT EXAM  07/24/2023   Medicare Annual Wellness (AWV)  10/25/2023   HEMOGLOBIN A1C  12/08/2023   Diabetic kidney evaluation - eGFR measurement  06/08/2024   Diabetic kidney evaluation - Urine ACR  06/21/2024   Pneumonia Vaccine 65+  Years old  Completed   INFLUENZA VACCINE  Completed   DEXA SCAN  Completed   Hepatitis C Screening  Completed   Zoster Vaccines- Shingrix  Completed   HPV VACCINES  Aged Out   Colonoscopy  Discontinued    Health Maintenance  Health Maintenance Due  Topic Date Due   DTaP/Tdap/Td (1 - Tdap) Never done   COVID-19 Vaccine (3 - Moderna risk series) 07/17/2022   OPHTHALMOLOGY EXAM  07/06/2023   FOOT EXAM  07/24/2023   Medicare Annual Wellness (AWV)  10/25/2023   Health Maintenance Items Addressed: Mammogram scheduled, DEXA scheduled, Podiatry referral placed.   Additional Screening:  Vision Screening: Recommended annual ophthalmology exams for early detection of glaucoma and other disorders of the eye.  Dental Screening: Recommended annual dental exams for proper oral hygiene  Community Resource Referral / Chronic Care Management: CRR required this visit?  Yes   CCM required this visit?  No     Plan:     I have personally reviewed and noted the following in the patient's chart:   Medical and social history Use of alcohol, tobacco or illicit drugs  Current medications and supplements including opioid prescriptions. Patient is not currently taking opioid prescriptions. Functional ability and status Nutritional status Physical activity Advanced directives List of other physicians Hospitalizations, surgeries, and ER visits in previous 12 months Vitals Screenings to include cognitive, depression, and falls Referrals and appointments  In addition, I have reviewed and  discussed with patient certain preventive protocols, quality metrics, and best practice recommendations. A written personalized care plan for preventive services as well as general preventive health recommendations were provided to patient.     Jordan Hawks Cindia Hustead, CMA   11/14/2023   After Visit Summary: (MyChart) Due to this being a telephonic visit, the after visit summary with patients personalized plan was offered to patient via MyChart   Notes: Please refer to Routing Comments.

## 2023-11-16 ENCOUNTER — Telehealth: Payer: Self-pay

## 2023-11-16 NOTE — Progress Notes (Signed)
 Complex Care Management Note  Care Guide Note 11/16/2023 Name: Nicole Horn MRN: [id number] DOB: [birth year]  Nicole Horn is a 78 y.o. year old female who sees Durwin Nora, Lucina Mellow, MD for primary care. I reached out to Santiago Bumpers by phone today to offer complex care management services.  [name] was given information about Complex Care Management services today including:   The Complex Care Management services include support from the care team which includes your Nurse Care Manager, Clinical Social Worker, or Pharmacist.  The Complex Care Management team is here to help remove barriers to the health concerns and goals most important to you. Complex Care Management services are voluntary, and the patient may decline or stop services at any time by request to their care team member.   Complex Care Management Consent Status: Patient agreed to services and verbal consent obtained.   Follow up plan:  Telephone appointment with complex care management team member scheduled for:  11/17/2023  Encounter Outcome:  Patient Scheduled  Penne Lash , RMA     [place]  [facility] Guide  Direct Dial: [phone]  Website: [email or web address]

## 2023-11-17 ENCOUNTER — Ambulatory Visit: Payer: Self-pay | Admitting: Licensed Clinical Social Worker

## 2023-11-17 NOTE — Patient Instructions (Signed)
 Visit Information  Thank you for taking time to visit with me today. Please don't hesitate to contact me if I can be of assistance to you.   Following are the goals we discussed today:   Goals Addressed             This Visit's Progress    Increase Access to [facility] Coordination Interventions: Assessed Social Determinants of Health Reviewed all upcoming appointments in Epic system Solution-Focused Strategies employed:  Active listening / Reflection utilized  Emotional Support Provided Problem Solving /Task Center strategies reviewed Provided general psycho-education for mental health needs  CSW and pt spoke with pt's moms insurance regarding coverage, pt qualifies for in home aid hours with home health referral, CSW will request this from PCP office. CSW will also send list of local private pay resources to pt as well         Our next appointment is by telephone on 12/01/2023.  Please call the care guide team at [phone] if you need to cancel or reschedule your appointment.   If you are experiencing a Mental Health or Behavioral Health Crisis or need someone to talk to, please call the Suicide and Crisis Lifeline: 988  Patient verbalizes understanding of instructions and care plan provided today and agrees to view in MyChart. Active MyChart status and patient understanding of how to access instructions and care plan via MyChart confirmed with patient.     Telephone follow up appointment with care management team member scheduled for: 12/01/2023

## 2023-11-17 NOTE — Patient Outreach (Signed)
 Care Coordination   Initial Visit Note   11/17/2023 Name: TRYNITI LAATSCH MRN: [id number] DOB: June 04, 1946  ZHANE DONLAN is a 78 y.o. year old female who sees Durwin Nora, Lucina Mellow, MD for primary care. I spoke with  Santiago Bumpers by phone today.  What matters to the patients health and wellness today?  Getting more assistance with my mom's care.    Goals Addressed             This Visit's Progress    Increase Access to [facility] Coordination Interventions: Assessed Social Determinants of Health Reviewed all upcoming appointments in Epic system Solution-Focused Strategies employed:  Active listening / Reflection utilized  Emotional Support Provided Problem Solving /Task Center strategies reviewed Provided general psycho-education for mental health needs  CSW and pt spoke with pt's moms insurance regarding coverage, pt qualifies for in home aid hours with home health referral, CSW will request this from PCP office. CSW will also send list of local private pay resources to pt as well         SDOH assessments and interventions completed:  Yes     Care Coordination Interventions:  Yes, provided   Interventions Today    Flowsheet Row Most Recent Value  Chronic Disease   Chronic disease during today's visit Other  [Caregiver Stress]  General Interventions   General Interventions Discussed/Reviewed [employer], General Interventions Discussed  [CSW and pt spoke with [facility] regarding her mom's benefits and what would be available for assistance in the home. She is eligible for in home aid services with home health referral, CSW will speak with provider regarding this.]  Education Interventions   Education Provided Provided Education  [CSW and pt discussed private pay options and the process for initiating that. CSW will send list of available resources in her area.]        Follow up plan: Follow up call scheduled for  12/01/2023    Encounter Outcome:  Patient Visit Completed   Kenton Kingfisher, LCSW [facility] Licensed Clinical Social Worker Care Coordinator [phone]

## 2023-11-30 ENCOUNTER — Ambulatory Visit (HOSPITAL_COMMUNITY)
Admission: RE | Admit: 2023-11-30 | Discharge: 2023-11-30 | Disposition: A | Discharge status: Home / Self Care | Source: Ambulatory Visit | Attending: Internal Medicine | Admitting: Internal Medicine

## 2023-11-30 ENCOUNTER — Other Ambulatory Visit (HOSPITAL_COMMUNITY)

## 2023-11-30 DIAGNOSIS — Z780 Asymptomatic menopausal state: Secondary | ICD-10-CM | POA: Insufficient documentation

## 2023-11-30 DIAGNOSIS — M85852 Other specified disorders of bone density and structure, left thigh: Secondary | ICD-10-CM | POA: Diagnosis not present

## 2023-11-30 DIAGNOSIS — Z1231 Encounter for screening mammogram for malignant neoplasm of breast: Secondary | ICD-10-CM | POA: Insufficient documentation

## 2023-12-01 ENCOUNTER — Encounter: Payer: Self-pay | Admitting: Licensed Clinical Social Worker

## 2023-12-02 ENCOUNTER — Other Ambulatory Visit: Payer: Self-pay | Admitting: Internal Medicine

## 2023-12-07 ENCOUNTER — Encounter: Payer: Self-pay | Admitting: Internal Medicine

## 2023-12-09 ENCOUNTER — Ambulatory Visit: Payer: Medicare PPO | Admitting: Internal Medicine

## 2023-12-09 ENCOUNTER — Encounter: Payer: Self-pay | Admitting: Internal Medicine

## 2023-12-09 VITALS — BP 144/76 | HR 76 | Ht 60.0 in | Wt 164.2 lb

## 2023-12-09 DIAGNOSIS — F322 Major depressive disorder, single episode, severe without psychotic features: Secondary | ICD-10-CM | POA: Diagnosis not present

## 2023-12-09 DIAGNOSIS — N182 Chronic kidney disease, stage 2 (mild): Secondary | ICD-10-CM | POA: Diagnosis not present

## 2023-12-09 DIAGNOSIS — E669 Obesity, unspecified: Secondary | ICD-10-CM | POA: Diagnosis not present

## 2023-12-09 DIAGNOSIS — E785 Hyperlipidemia, unspecified: Secondary | ICD-10-CM | POA: Diagnosis not present

## 2023-12-09 DIAGNOSIS — I10 Essential (primary) hypertension: Secondary | ICD-10-CM | POA: Diagnosis not present

## 2023-12-09 DIAGNOSIS — Z7984 Long term (current) use of oral hypoglycemic drugs: Secondary | ICD-10-CM

## 2023-12-09 DIAGNOSIS — N1831 Chronic kidney disease, stage 3a: Secondary | ICD-10-CM

## 2023-12-09 DIAGNOSIS — E1122 Type 2 diabetes mellitus with diabetic chronic kidney disease: Secondary | ICD-10-CM | POA: Diagnosis not present

## 2023-12-09 DIAGNOSIS — R5383 Other fatigue: Secondary | ICD-10-CM | POA: Diagnosis not present

## 2023-12-09 DIAGNOSIS — K760 Fatty (change of) liver, not elsewhere classified: Secondary | ICD-10-CM

## 2023-12-09 DIAGNOSIS — E539 Vitamin B deficiency, unspecified: Secondary | ICD-10-CM | POA: Diagnosis not present

## 2023-12-09 MED ORDER — FLUOXETINE HCL 10 MG PO CAPS: 10.0000 mg | ORAL_CAPSULE | Freq: Every day | ORAL | Status: AC

## 2023-12-09 MED ORDER — AMLODIPINE-OLMESARTAN 10-20 MG PO TABS: 1.0000 | ORAL_TABLET | Freq: Every day | ORAL | 2 refills | Status: AC

## 2023-12-09 NOTE — Assessment & Plan Note (Signed)
 Her acute concern today is fatigue that has gradually worsened over the last 2 months.  She endorses associated sleep disturbance, depressed mood, and difficulty concentrating.  I believe her symptoms are largely due to poorly controlled depression.  Resuming fluoxetine  as otherwise documented.  Will also update basic labs to exclude metabolic etiologies.

## 2023-12-09 NOTE — Assessment & Plan Note (Signed)
 Lipid panel poorly controlled on labs from October 2024.  Total cholesterol 269 and LDL 187.  She has improved compliance with rosuvastatin  40 mg daily.  Repeat lipid panel ordered today.

## 2023-12-09 NOTE — Progress Notes (Signed)
 Established Patient Office Visit  Subjective   Patient ID: Nicole Horn, female    DOB: 1946/03/19  Age: 78 y.o. MRN: [id number]  Chief Complaint  Patient presents with   Care Management    Three month follow up    Fatigue    Really tired and low energy    Nicole Horn returns today for routine follow-up.  She was last evaluated by me on 1/17 at which time she endorsed a persistent dry cough.  Tessalon  Perles were added for symptom relief.  No additional medication changes were made and 74-month follow-up was arranged.  There have been no acute interval events. Today she reports feeling poorly. She endorses increased fatigue, depressed mood, sleep disturbance, and difficulty concentrating. Symptoms have gradually worsened over the last two months. She feels stressed because of her mother's health and states that she does not feel like herself currently.   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   Type 2 diabetes mellitus (HCC)    Past Surgical History:  Procedure Laterality Date   ABDOMINAL HYSTERECTOMY     BACK SURGERY     cervical fuision   BARTHOLIN GLAND CYST EXCISION Left 03/04/2015   Procedure: EXCISION OF LEFT BARTHOLINS TUMOR;  Surgeon: Norleen Edsel GAILS, MD;  Location: [facility];  Service: Gynecology;  Laterality: Left;   CATARACT EXTRACTION W/PHACO Right 03/27/2021   Procedure: CATARACT EXTRACTION PHACO AND INTRAOCULAR LENS  PLACEMENT (IOC);  Surgeon: Harrie Agent, MD;  Location: [facility];  Service: Ophthalmology;  Laterality: Right;  cde 8.45   CATARACT EXTRACTION W/PHACO Left 11/06/2021   Procedure: CATARACT EXTRACTION PHACO AND INTRAOCULAR LENS PLACEMENT (IOC);  Surgeon: Harrie Agent, MD;  Location: [facility];  Service: Ophthalmology;  Laterality: Left;  CDE:13.69   CERVICAL SPINE SURGERY     COLONOSCOPY N/A 02/01/2020   Procedure: COLONOSCOPY;  Surgeon: Shaaron Lamar CHRISTELLA, MD; Scattered medium mouth diverticula in the sigmoid and descending colon, otherwise normal exam.   CYST REMOVAL TRUNK     ESOPHAGOGASTRODUODENOSCOPY (EGD) WITH PROPOFOL  N/A 03/27/2020   Surgeon: Shaaron Lamar CHRISTELLA, MD;  normal esophagus and stomach, 2 small duodenal erosions.   GIVENS CAPSULE STUDY N/A 03/27/2020   Surgeon: Shaaron Lamar CHRISTELLA, MD;  multiple small bowel angiectasia's   Multiple cyst removal surgeries     Stent of lower extremities     Social History   Tobacco Use   Smoking status: Former    Current packs/day: 0.00    Average packs/day: 2.0 packs/day for 25.0 years (50.0 ttl pk-yrs)    Types: Cigarettes    Start date: 03/10/1960    Quit date: 03/10/1985    Years since quitting: 38.7   Smokeless tobacco: Never  Vaping Use   Vaping status: Never Used  Substance Use Topics   Alcohol use: Yes    Comment: socially; rarely   Drug use: No   Family History  Problem Relation Age of Onset   Stroke Paternal Grandmother    Heart attack Paternal Grandmother    Colon cancer Mother        In her 55s   Healthy Brother    Breast cancer Sister    Stomach cancer Maternal Aunt    No Known Allergies  Review of Systems  Constitutional:  Positive for malaise/fatigue. Negative for chills and fever.  HENT:  Negative for sore throat.   Respiratory:  Negative for cough and shortness of breath.   Cardiovascular:  Negative for chest pain, palpitations and leg swelling.  Gastrointestinal:  Negative for abdominal pain, blood in stool,  constipation, diarrhea, nausea and vomiting.  Genitourinary:  Negative for dysuria and hematuria.  Musculoskeletal:  Negative for myalgias.  Skin:  Negative for itching and rash.  Neurological:  Negative for dizziness and headaches.  Psychiatric/Behavioral:  Positive for depression. Negative for suicidal ideas. The patient is nervous/anxious.      Objective:     BP (!) 144/76   Pulse 76   Ht 5' (1.524 m)   Wt 164 lb 3.2 oz (74.5 kg)   SpO2 93%   BMI 32.07 kg/m  BP Readings from Last 3 Encounters:  12/09/23 (!) 144/76  11/14/23 (!) 158/85  09/09/23 132/68   Physical Exam Vitals reviewed.  Constitutional:      General: She is not in acute distress.    Appearance: Normal appearance. She is obese. She is not toxic-appearing.  HENT:     Head: Normocephalic and atraumatic.     Right Ear: External ear normal.     Left Ear: External ear normal.     Nose: Nose normal. No congestion or rhinorrhea.     Mouth/Throat:     Mouth: Mucous membranes are moist.     Pharynx: Oropharynx is clear. No oropharyngeal exudate or posterior oropharyngeal erythema.  Eyes:     General: No scleral icterus.    Extraocular Movements: Extraocular movements intact.     Conjunctiva/sclera: Conjunctivae normal.     Pupils: Pupils are equal, round, and reactive to light.  Cardiovascular:     Rate and Rhythm: Normal rate and regular rhythm.     Pulses: Normal pulses.     Heart sounds: Normal heart sounds. No murmur heard.    No friction rub. No gallop.  Pulmonary:     Effort: Pulmonary effort is normal.     Breath sounds: Normal breath sounds. No wheezing, rhonchi or rales.  Abdominal:     General: Abdomen is flat. Bowel sounds are normal. There is no distension.     Palpations: Abdomen is soft.     Tenderness: There is no abdominal tenderness.  Musculoskeletal:        General: No swelling. Normal range of motion.     Cervical back: Normal range of motion.     Right lower leg: No edema.     Left  lower leg: No edema.  Lymphadenopathy:     Cervical: No cervical adenopathy.  Skin:    General: Skin is warm and dry.     Capillary Refill: Capillary refill takes less than 2 seconds.     Coloration: Skin is not jaundiced.  Neurological:     General: No focal deficit present.     Mental Status: She is alert and oriented to person, place, and time.  Psychiatric:        Mood and Affect: Mood normal.        Behavior: Behavior normal.  Last CBC Lab Results  Component Value Date   WBC 4.8 06/09/2023   HGB 13.5 06/09/2023   HCT 41.9 06/09/2023   MCV 85 06/09/2023   MCH 27.3 06/09/2023   RDW 13.3 06/09/2023   PLT 315 06/09/2023   Last metabolic panel Lab Results  Component Value Date   GLUCOSE 132 (H) 06/09/2023   NA 146 (H) 06/09/2023   K 4.3 06/09/2023   CL 107 (H) 06/09/2023   CO2 22 06/09/2023   BUN 22 06/09/2023   CREATININE 1.18 (H) 06/09/2023   EGFR 48 (L) 06/09/2023   CALCIUM  9.6 06/09/2023   PROT 7.3 06/09/2023   ALBUMIN 4.3 06/09/2023   LABGLOB 3.0 06/09/2023   AGRATIO 1.7 05/13/2021   BILITOT 0.3 06/09/2023   ALKPHOS 96 06/09/2023   AST 29 06/09/2023   ALT 29 06/09/2023   ANIONGAP 6 08/31/2022   Last lipids Lab Results  Component Value Date   CHOL 269 (H) 06/09/2023   HDL 47 06/09/2023   LDLCALC 187 (H) 06/09/2023   TRIG 184 (H) 06/09/2023   CHOLHDL 5.7 (H) 06/09/2023   Last hemoglobin A1c Lab Results  Component Value Date   HGBA1C 7.2 (H) 06/09/2023   Last thyroid  functions Lab Results  Component Value Date   TSH 0.666 06/09/2023   Last vitamin D  Lab Results  Component Value Date   VD25OH 37.4 06/09/2023   Last vitamin B12 and Folate Lab Results  Component Value Date   VITAMINB12 606 06/09/2023   FOLATE >20.0 06/09/2023     Assessment & Plan:   Problem List Items Addressed This Visit       Essential hypertension - Primary   BP remains elevated today.  Her currently prescribed antihypertensive regimen includes amlodipine  10 mg  daily, Toprol -XL 12.5 mg daily, and spironolactone  25 mg daily. -Discontinue amlodipine  10 mg daily and start amlodipine -olmesartan  10-20 mg daily      Type 2 diabetes mellitus with stage 2 chronic kidney disease, without long-term current use of insulin  (HCC)   A1c 7.2 on labs from October 2024.  She is currently prescribed Jardiance  10 mg daily.  Repeat A1c and urine microalbumin/creatinine ratio ordered today      CKD stage 3a, GFR 45-59 ml/min (HCC)   Stable on labs from October 2024.  Currently prescribed Jardiance .  Adding ordered today.  Repeat labs ordered.      Hyperlipemia   Lipid panel poorly controlled on labs from October 2024.  Total cholesterol 269 and LDL 187.  She has improved compliance with rosuvastatin  40 mg daily.  Repeat lipid panel ordered today.      Depression, major, single episode, severe (HCC)   Her acute concern today is increased fatigue, depressed mood, sleep disturbance, and difficulty concentrating.  Symptoms have worsened over the last 2 months.  Depression was previously adequately controlled with fluoxetine  and nortriptyline , which she stopped taking on her own at the beginning of this year.  I believe her current symptoms are largely due to poorly controlled depression. -Through shared decision making, she will resume fluoxetine  10 mg daily, can increase to 20 mg daily if needed.      Fatigue   Her acute concern today is fatigue that has gradually worsened over the last 2 months.  She endorses associated sleep disturbance, depressed mood, and difficulty concentrating.  I believe her symptoms are largely due to poorly controlled depression.  Resuming fluoxetine  as otherwise documented.  Will also update basic labs to exclude metabolic etiologies.  Return in about 3 months (around 03/09/2024).   Manus FORBES Fireman, MD

## 2023-12-09 NOTE — Patient Instructions (Signed)
 It was a pleasure to see you today.  Thank you for giving us  the opportunity to be involved in your care.  Below is a brief recap of your visit and next steps.  We will plan to see you again in 3 months.  Summary Discontinue amlodipine . Start amlodipine -olmesartan  10-20 mg daily for hypertension Resume fluoxetine  for depression. OK to increase to 20 mg daily if needed Repeat labs ordered Follow up in 3 months

## 2023-12-09 NOTE — Assessment & Plan Note (Signed)
 Stable on labs from October 2024.  Currently prescribed Jardiance .  Adding ordered today.  Repeat labs ordered.

## 2023-12-09 NOTE — Assessment & Plan Note (Signed)
 Her acute concern today is increased fatigue, depressed mood, sleep disturbance, and difficulty concentrating.  Symptoms have worsened over the last 2 months.  Depression was previously adequately controlled with fluoxetine  and nortriptyline , which she stopped taking on her own at the beginning of this year.  I believe her current symptoms are largely due to poorly controlled depression. -Through shared decision making, she will resume fluoxetine  10 mg daily, can increase to 20 mg daily if needed.

## 2023-12-09 NOTE — Assessment & Plan Note (Signed)
 A1c 7.2 on labs from October 2024.  She is currently prescribed Jardiance  10 mg daily.  Repeat A1c and urine microalbumin/creatinine ratio ordered today

## 2023-12-09 NOTE — Assessment & Plan Note (Signed)
 BP remains elevated today.  Her currently prescribed antihypertensive regimen includes amlodipine  10 mg daily, Toprol -XL 12.5 mg daily, and spironolactone  25 mg daily. -Discontinue amlodipine  10 mg daily and start amlodipine -olmesartan  10-20 mg daily

## 2023-12-11 LAB — CBC WITH DIFFERENTIAL/PLATELET
Basophils Absolute: 0 10*3/uL (ref 0.0–0.2)
Basos: 1 %
EOS (ABSOLUTE): 0.3 10*3/uL (ref 0.0–0.4)
Eos: 8 %
Hematocrit: 45.2 % (ref 34.0–46.6)
Hemoglobin: 13.8 g/dL (ref 11.1–15.9)
Immature Grans (Abs): 0 10*3/uL (ref 0.0–0.1)
Immature Granulocytes: 0 %
Lymphocytes Absolute: 1.6 10*3/uL (ref 0.7–3.1)
Lymphs: 41 %
MCH: 26.2 pg — ABNORMAL LOW (ref 26.6–33.0)
MCHC: 30.5 g/dL — ABNORMAL LOW (ref 31.5–35.7)
MCV: 86 fL (ref 79–97)
Monocytes Absolute: 0.4 10*3/uL (ref 0.1–0.9)
Monocytes: 11 %
Neutrophils Absolute: 1.4 10*3/uL (ref 1.4–7.0)
Neutrophils: 39 %
Platelets: 295 10*3/uL (ref 150–450)
RBC: 5.26 x10E6/uL (ref 3.77–5.28)
RDW: 12.8 % (ref 11.7–15.4)
WBC: 3.7 10*3/uL (ref 3.4–10.8)

## 2023-12-11 LAB — LIPID PANEL
Chol/HDL Ratio: 3.2 ratio (ref 0.0–4.4)
Cholesterol, Total: 138 mg/dL (ref 100–199)
HDL: 43 mg/dL (ref >39)
LDL Chol Calc (NIH): 76 mg/dL (ref 0–99)
Triglycerides: 102 mg/dL (ref 0–149)
VLDL Cholesterol Cal: 19 mg/dL (ref 5–40)

## 2023-12-11 LAB — CMP14+EGFR
ALT: 26 IU/L (ref 0–32)
AST: 23 IU/L (ref 0–40)
Albumin: 4.3 g/dL (ref 3.8–4.8)
Alkaline Phosphatase: 104 IU/L (ref 44–121)
BUN/Creatinine Ratio: 13 (ref 12–28)
BUN: 15 mg/dL (ref 8–27)
Bilirubin Total: 0.2 mg/dL (ref 0.0–1.2)
CO2: 25 mmol/L (ref 20–29)
Calcium: 9.7 mg/dL (ref 8.7–10.3)
Chloride: 108 mmol/L — ABNORMAL HIGH (ref 96–106)
Creatinine, Ser: 1.13 mg/dL — ABNORMAL HIGH (ref 0.57–1.00)
Globulin, Total: 2.6 g/dL (ref 1.5–4.5)
Glucose: 152 mg/dL — ABNORMAL HIGH (ref 70–99)
Potassium: 4.5 mmol/L (ref 3.5–5.2)
Sodium: 147 mmol/L — ABNORMAL HIGH (ref 134–144)
Total Protein: 6.9 g/dL (ref 6.0–8.5)
eGFR: 50 mL/min/{1.73_m2} — ABNORMAL LOW (ref >59)

## 2023-12-11 LAB — MICROALBUMIN / CREATININE URINE RATIO
Creatinine, Urine: 100.5 mg/dL
Microalb/Creat Ratio: 31 mg/g{creat} — ABNORMAL HIGH (ref 0–29)
Microalbumin, Urine: 31.6 ug/mL

## 2023-12-11 LAB — B12 AND FOLATE PANEL
Folate: 14.9 ng/mL (ref >3.0)
Vitamin B-12: 511 pg/mL (ref 232–1245)

## 2023-12-11 LAB — HEMOGLOBIN A1C
Est. average glucose Bld gHb Est-mCnc: 180 mg/dL
Hgb A1c MFr Bld: 7.9 % — ABNORMAL HIGH (ref 4.8–5.6)

## 2023-12-11 LAB — IRON,TIBC AND FERRITIN PANEL
Ferritin: 139 ng/mL (ref 15–150)
Iron Saturation: 20 % (ref 15–55)
Iron: 62 ug/dL (ref 27–139)
Total Iron Binding Capacity: 303 ug/dL (ref 250–450)
UIBC: 241 ug/dL (ref 118–369)

## 2023-12-11 LAB — VITAMIN D 25 HYDROXY (VIT D DEFICIENCY, FRACTURES): Vit D, 25-Hydroxy: 37.8 ng/mL (ref 30.0–100.0)

## 2023-12-11 LAB — TSH+FREE T4
Free T4: 0.87 ng/dL (ref 0.82–1.77)
TSH: 0.262 u[IU]/mL — ABNORMAL LOW (ref 0.450–4.500)

## 2023-12-12 ENCOUNTER — Encounter: Payer: Self-pay | Admitting: Internal Medicine

## 2023-12-12 ENCOUNTER — Other Ambulatory Visit: Payer: Self-pay | Admitting: Internal Medicine

## 2023-12-12 DIAGNOSIS — E1122 Type 2 diabetes mellitus with diabetic chronic kidney disease: Secondary | ICD-10-CM

## 2023-12-12 MED ORDER — EMPAGLIFLOZIN 25 MG PO TABS: 25.0000 mg | ORAL_TABLET | Freq: Every day | ORAL | 1 refills | Status: DC

## 2023-12-29 DIAGNOSIS — I739 Peripheral vascular disease, unspecified: Secondary | ICD-10-CM | POA: Diagnosis not present

## 2023-12-29 DIAGNOSIS — I2510 Atherosclerotic heart disease of native coronary artery without angina pectoris: Secondary | ICD-10-CM | POA: Diagnosis not present

## 2023-12-29 DIAGNOSIS — Z0130 Encounter for examination of blood pressure without abnormal findings: Secondary | ICD-10-CM | POA: Diagnosis not present

## 2023-12-29 DIAGNOSIS — Z139 Encounter for screening, unspecified: Secondary | ICD-10-CM | POA: Diagnosis not present

## 2023-12-29 DIAGNOSIS — Z0000 Encounter for general adult medical examination without abnormal findings: Secondary | ICD-10-CM | POA: Diagnosis not present

## 2023-12-29 DIAGNOSIS — N182 Chronic kidney disease, stage 2 (mild): Secondary | ICD-10-CM | POA: Diagnosis not present

## 2023-12-29 DIAGNOSIS — E66811 Obesity, class 1: Secondary | ICD-10-CM | POA: Diagnosis not present

## 2023-12-29 DIAGNOSIS — Z6830 Body mass index (BMI) 30.0-30.9, adult: Secondary | ICD-10-CM | POA: Diagnosis not present

## 2023-12-29 DIAGNOSIS — E1122 Type 2 diabetes mellitus with diabetic chronic kidney disease: Secondary | ICD-10-CM | POA: Diagnosis not present

## 2023-12-29 DIAGNOSIS — E1151 Type 2 diabetes mellitus with diabetic peripheral angiopathy without gangrene: Secondary | ICD-10-CM | POA: Diagnosis not present

## 2023-12-29 DIAGNOSIS — I129 Hypertensive chronic kidney disease with stage 1 through stage 4 chronic kidney disease, or unspecified chronic kidney disease: Secondary | ICD-10-CM | POA: Diagnosis not present

## 2024-01-09 ENCOUNTER — Other Ambulatory Visit: Payer: Self-pay

## 2024-01-09 ENCOUNTER — Emergency Department (HOSPITAL_COMMUNITY)
Admission: EM | Admit: 2024-01-09 | Discharge: 2024-01-09 | Disposition: A | Discharge status: Home / Self Care | Attending: Emergency Medicine | Admitting: Emergency Medicine

## 2024-01-09 ENCOUNTER — Emergency Department (HOSPITAL_COMMUNITY)

## 2024-01-09 ENCOUNTER — Encounter (HOSPITAL_COMMUNITY): Payer: Self-pay | Admitting: Emergency Medicine

## 2024-01-09 DIAGNOSIS — Z7984 Long term (current) use of oral hypoglycemic drugs: Secondary | ICD-10-CM | POA: Diagnosis not present

## 2024-01-09 DIAGNOSIS — R509 Fever, unspecified: Secondary | ICD-10-CM | POA: Diagnosis not present

## 2024-01-09 DIAGNOSIS — I517 Cardiomegaly: Secondary | ICD-10-CM | POA: Diagnosis not present

## 2024-01-09 DIAGNOSIS — R9389 Abnormal findings on diagnostic imaging of other specified body structures: Secondary | ICD-10-CM | POA: Diagnosis not present

## 2024-01-09 DIAGNOSIS — R000 Tachycardia, unspecified: Secondary | ICD-10-CM | POA: Diagnosis not present

## 2024-01-09 DIAGNOSIS — R410 Disorientation, unspecified: Secondary | ICD-10-CM | POA: Diagnosis not present

## 2024-01-09 DIAGNOSIS — I6782 Cerebral ischemia: Secondary | ICD-10-CM | POA: Diagnosis not present

## 2024-01-09 DIAGNOSIS — Z7982 Long term (current) use of aspirin: Secondary | ICD-10-CM | POA: Insufficient documentation

## 2024-01-09 DIAGNOSIS — Z79899 Other long term (current) drug therapy: Secondary | ICD-10-CM | POA: Diagnosis not present

## 2024-01-09 DIAGNOSIS — R531 Weakness: Secondary | ICD-10-CM | POA: Insufficient documentation

## 2024-01-09 DIAGNOSIS — E119 Type 2 diabetes mellitus without complications: Secondary | ICD-10-CM | POA: Diagnosis not present

## 2024-01-09 DIAGNOSIS — R4182 Altered mental status, unspecified: Secondary | ICD-10-CM | POA: Diagnosis not present

## 2024-01-09 DIAGNOSIS — F419 Anxiety disorder, unspecified: Secondary | ICD-10-CM | POA: Diagnosis not present

## 2024-01-09 DIAGNOSIS — I10 Essential (primary) hypertension: Secondary | ICD-10-CM | POA: Insufficient documentation

## 2024-01-09 DIAGNOSIS — I700 Atherosclerosis of aorta: Secondary | ICD-10-CM | POA: Diagnosis not present

## 2024-01-09 DIAGNOSIS — R918 Other nonspecific abnormal finding of lung field: Secondary | ICD-10-CM | POA: Diagnosis not present

## 2024-01-09 DIAGNOSIS — F411 Generalized anxiety disorder: Secondary | ICD-10-CM | POA: Diagnosis not present

## 2024-01-09 LAB — CBC
HCT: 44.2 % (ref 36.0–46.0)
Hemoglobin: 14 g/dL (ref 12.0–15.0)
MCH: 26.7 pg (ref 26.0–34.0)
MCHC: 31.7 g/dL (ref 30.0–36.0)
MCV: 84.4 fL (ref 80.0–100.0)
Platelets: 274 10*3/uL (ref 150–400)
RBC: 5.24 MIL/uL — ABNORMAL HIGH (ref 3.87–5.11)
RDW: 13.3 % (ref 11.5–15.5)
WBC: 10 10*3/uL (ref 4.0–10.5)
nRBC: 0 % (ref 0.0–0.2)

## 2024-01-09 LAB — DIFFERENTIAL
Abs Immature Granulocytes: 0 10*3/uL (ref 0.00–0.07)
Band Neutrophils: 1 %
Basophils Absolute: 0 10*3/uL (ref 0.0–0.1)
Basophils Relative: 0 %
Eosinophils Absolute: 0 10*3/uL (ref 0.0–0.5)
Eosinophils Relative: 0 %
Lymphocytes Relative: 7 %
Lymphs Abs: 0.7 10*3/uL (ref 0.7–4.0)
Monocytes Absolute: 0.6 10*3/uL (ref 0.1–1.0)
Monocytes Relative: 6 %
Neutro Abs: 8.7 10*3/uL — ABNORMAL HIGH (ref 1.7–7.7)
Neutrophils Relative %: 86 %

## 2024-01-09 LAB — RESP PANEL BY RT-PCR (RSV, FLU A&B, COVID)  RVPGX2
Influenza A by PCR: NEGATIVE
Influenza B by PCR: NEGATIVE
Resp Syncytial Virus by PCR: NEGATIVE
SARS Coronavirus 2 by RT PCR: NEGATIVE

## 2024-01-09 LAB — URINALYSIS, ROUTINE W REFLEX MICROSCOPIC
Bacteria, UA: NONE SEEN
Bilirubin Urine: NEGATIVE
Glucose, UA: NEGATIVE mg/dL
Ketones, ur: 5 mg/dL — AB
Leukocytes,Ua: NEGATIVE
Nitrite: NEGATIVE
Protein, ur: 100 mg/dL — AB
Specific Gravity, Urine: 1.018 (ref 1.005–1.030)
pH: 5 (ref 5.0–8.0)

## 2024-01-09 LAB — RAPID URINE DRUG SCREEN, HOSP PERFORMED
Amphetamines: NOT DETECTED
Barbiturates: NOT DETECTED
Benzodiazepines: NOT DETECTED
Cocaine: NOT DETECTED
Opiates: NOT DETECTED
Tetrahydrocannabinol: NOT DETECTED

## 2024-01-09 LAB — PROTIME-INR
INR: 1.2 (ref 0.8–1.2)
Prothrombin Time: 15.6 s — ABNORMAL HIGH (ref 11.4–15.2)

## 2024-01-09 LAB — COMPREHENSIVE METABOLIC PANEL WITH GFR
ALT: 32 U/L (ref 0–44)
AST: 26 U/L (ref 15–41)
Albumin: 3.3 g/dL — ABNORMAL LOW (ref 3.5–5.0)
Alkaline Phosphatase: 78 U/L (ref 38–126)
Anion gap: 10 (ref 5–15)
BUN: 19 mg/dL (ref 8–23)
CO2: 22 mmol/L (ref 22–32)
Calcium: 9 mg/dL (ref 8.9–10.3)
Chloride: 102 mmol/L (ref 98–111)
Creatinine, Ser: 1.25 mg/dL — ABNORMAL HIGH (ref 0.44–1.00)
GFR, Estimated: 45 mL/min — ABNORMAL LOW (ref >60)
Glucose, Bld: 135 mg/dL — ABNORMAL HIGH (ref 70–99)
Potassium: 3.9 mmol/L (ref 3.5–5.1)
Sodium: 134 mmol/L — ABNORMAL LOW (ref 135–145)
Total Bilirubin: 0.9 mg/dL (ref 0.0–1.2)
Total Protein: 8.3 g/dL — ABNORMAL HIGH (ref 6.5–8.1)

## 2024-01-09 LAB — LACTIC ACID, PLASMA: Lactic Acid, Venous: 1.4 mmol/L (ref 0.5–1.9)

## 2024-01-09 LAB — ETHANOL: Alcohol, Ethyl (B): <15 mg/dL (ref <15)

## 2024-01-09 LAB — APTT: aPTT: 29 s (ref 24–36)

## 2024-01-09 MED ORDER — LACTATED RINGERS IV SOLN
INTRAVENOUS | Status: DC
  Administered 2024-01-09: 1000 mL via INTRAVENOUS

## 2024-01-09 MED ORDER — SODIUM CHLORIDE 0.9 % IV SOLN
1.0000 g | Freq: Once | INTRAVENOUS | Status: AC
  Administered 2024-01-09: 1 g via INTRAVENOUS
  Filled 2024-01-09: qty 10

## 2024-01-09 NOTE — ED Notes (Signed)
 Patient transported to MRI

## 2024-01-09 NOTE — ED Notes (Addendum)
 Pt put on 2L [place]. Oxygen saturation is below 94%.

## 2024-01-09 NOTE — ED Provider Notes (Signed)
  EMERGENCY DEPARTMENT AT [facility] Provider Note   CSN: [id number] Arrival date & time: 01/09/24  1750     History  Chief Complaint  Patient presents with   Weakness    Nicole Horn is a 78 y.o. female.   Weakness  This patient is a 77 year old female, multiple medical problems including hypertension, diabetes, followed by Dr. Kermit Ped in the office, had been diagnosed with COVID-19 about 2 months ago.  Has not recently been admitted to the hospital.  She is presenting to the hospital today because of altered mental status and weakness.  Family is not present so the information is coming strictly from the paramedics.  I did try to call family at home but nobody answers.  The patient cannot tell me why she is here in fact when I asked her questions sometimes she just stares off and does not answer anything.  Sometimes she will answer a simple one-word question but if it takes her longer than 3 or 4 seconds to answer she just stares off in the distance.  She specifically denies any pain or shortness of breath or fevers chills nausea vomiting or diarrhea.  Report is that the patient has been feeling off for about 5 days, paramedics report that the patient would not walk for them but seem to be alert and awake.    Home Medications Prior to Admission medications   Medication Sig Start Date End Date Taking? Authorizing Provider  albuterol  (VENTOLIN  HFA) 108 (90 Base) MCG/ACT inhaler Inhale 1-2 puffs into the lungs every 6 (six) hours as needed for wheezing or shortness of breath.    [provider]  amlodipine -olmesartan  (AZOR ) 10-20 MG tablet Take 1 tablet by mouth daily. 12/09/23   Tobi Fortes, MD  aspirin  EC 81 MG tablet Take 1 tablet (81 mg total) by mouth daily. Swallow whole. 05/06/23   Tobi Fortes, MD  benzonatate  (TESSALON  PERLES) 100 MG capsule Take 1 capsule (100 mg total) by mouth 3 (three) times daily as needed for cough. 11/07/23    Tobi Fortes, MD  Blood Glucose Calibration (TRUE METRIX LEVEL 1) Low SOLN 1 application by In Vitro route daily Max Daily Amount: 1 application 11/01/17   [provider]  Blood Glucose Monitoring Suppl (TRUE METRIX GO GLUCOSE METER) w/Device KIT by Does not apply route. 10/28/17   [provider]  Calcium  Carbonate Antacid (CALCIUM  CARBONATE PO) Take 1,200 mg of elemental calcium  by mouth in the morning.    [provider]  cetirizine -pseudoephedrine  (ZYRTEC -D) 5-120 MG tablet Take 1 tablet by mouth daily. 07/03/23   Leath-Warren, Belen Bowers, NP  cholecalciferol (VITAMIN D3) 25 MCG (1000 UNIT) tablet Take 1,000 Units by mouth daily.    [provider]  empagliflozin  (JARDIANCE ) 25 MG TABS tablet Take 1 tablet (25 mg total) by mouth daily before breakfast. 12/12/23   Tobi Fortes, MD  Ferrous Sulfate  (IRON  PO) Take by mouth daily.    [provider]  FLUoxetine  (PROZAC ) 10 MG capsule Take 1 capsule (10 mg total) by mouth daily. 12/09/23   Tobi Fortes, MD  fluticasone  (FLONASE ) 50 MCG/ACT nasal spray SPRAY 2 SPRAYS INTO EACH NOSTRIL EVERY DAY 12/02/23   Dixon, Phillip E, MD  metoprolol  succinate (TOPROL  XL) 25 MG 24 hr tablet Take 0.5 tablets (12.5 mg total) by mouth daily. 05/06/23   Gerard Knight, MD  Multiple Vitamin (MULTIVITAMIN WITH MINERALS) TABS tablet Take 1 tablet by mouth in the  morning. Centrum    [provider]  Probiotic Product (DIGESTIVE ADVANTAGE) CAPS Take 1 capsule by mouth daily.    [provider]  rosuvastatin  (CRESTOR ) 40 MG tablet Take 1 tablet (40 mg total) by mouth daily. 06/23/23   Tobi Fortes, MD  silver  sulfADIAZINE  (SILVADENE ) 1 % cream Apply 1 Application topically 2 (two) times daily. Apply bid to affected area externally 01/11/23   Javan Messing, NP  spironolactone  (ALDACTONE ) 25 MG tablet Take 1 tablet (25 mg total) by mouth daily. Please call [phone] to schedule an appointment  with Dr. Teddie Favre for future refills. Thank you. 05/25/23   Gerard Knight, MD  valACYclovir  (VALTREX ) 500 MG tablet TAKE 1 TABLET (500 MG TOTAL) BY MOUTH DAILY. 07/13/23   Wendelyn Halter, MD  vitamin C (ASCORBIC ACID ) 500 MG tablet Take 500 mg by mouth 2 (two) times a week.    [provider]      Allergies    Patient has no known allergies.    Review of Systems   Review of Systems  Neurological:  Positive for weakness.  All other systems reviewed and are negative.   Physical Exam Updated Vital Signs BP (!) 150/78   Pulse (!) 101   Temp 98.4 F (36.9 C) (Oral)   Resp 19   Ht 1.524 m (5')   Wt 75 kg   SpO2 98%   BMI 32.29 kg/m  Physical Exam Vitals and nursing note reviewed.  Constitutional:      General: She is not in acute distress.    Appearance: She is well-developed.  HENT:     Head: Normocephalic and atraumatic.     Mouth/Throat:     Pharynx: No oropharyngeal exudate.  Eyes:     General: No scleral icterus.       Right eye: No discharge.        Left eye: No discharge.     Conjunctiva/sclera: Conjunctivae normal.     Pupils: Pupils are equal, round, and reactive to light.  Neck:     Thyroid: No thyromegaly.     Vascular: No JVD.  Cardiovascular:     Rate and Rhythm: Normal rate and regular rhythm.     Heart sounds: Normal heart sounds. No murmur heard.    No friction rub. No gallop.  Pulmonary:     Effort: Pulmonary effort is normal. No respiratory distress.     Breath sounds: Normal breath sounds. No wheezing or rales.  Abdominal:     General: Bowel sounds are normal. There is no distension.     Palpations: Abdomen is soft. There is no mass.     Tenderness: There is no abdominal tenderness.  Musculoskeletal:        General: No tenderness. Normal range of motion.     Cervical back: Normal range of motion and neck supple.     Right lower leg: No edema.     Left lower leg: No edema.  Lymphadenopathy:     Cervical: No cervical  adenopathy.  Skin:    General: Skin is warm and dry.     Findings: No erythema or rash.  Neurological:     Mental Status: She is alert.     Coordination: Coordination normal.     Comments: Cranial nerves III through XII appear to be intact, sensation is difficult to elicit as the patient will not answer the question of if she feels me touching her face on either side.  When  I asked her about sensation to her legs she is choosing the wrong leg every time I touch her leg.  She is however able to lift both arms and both legs with symmetrical strength, symmetrical grips, normal finger-nose-finger, her attention is significantly down and when I ask her questions sometimes she will just stare off.  I do not see any seizure activity, there is no tremor, she has normal muscle tone, there is no edema.  The patient thinks that she was born in [place], she cannot tell me her birthdate, she cannot tell me the building that we are in, she is able to repeat words that I say but it takes her quite some time.  She is able to identify simple objects.  Psychiatric:        Behavior: Behavior normal.     ED Results / Procedures / Treatments   Labs (all labs ordered are listed, but only abnormal results are displayed) Labs Reviewed  PROTIME-INR - Abnormal; Notable for the following components:      Result Value   Prothrombin Time 15.6 (*)    All other components within normal limits  CBC - Abnormal; Notable for the following components:   RBC 5.24 (*)    All other components within normal limits  DIFFERENTIAL - Abnormal; Notable for the following components:   Neutro Abs 8.7 (*)    All other components within normal limits  COMPREHENSIVE METABOLIC PANEL WITH GFR - Abnormal; Notable for the following components:   Sodium 134 (*)    Glucose, Bld 135 (*)    Creatinine, Ser 1.25 (*)    Total Protein 8.3 (*)    Albumin 3.3 (*)    GFR, Estimated 45 (*)    All other components within normal limits   URINALYSIS, ROUTINE W REFLEX MICROSCOPIC - Abnormal; Notable for the following components:   APPearance HAZY (*)    Hgb urine dipstick MODERATE (*)    Ketones, ur 5 (*)    Protein, ur 100 (*)    All other components within normal limits  RESP PANEL BY RT-PCR (RSV, FLU A&B, COVID)  [id number]  CULTURE, BLOOD (ROUTINE X 2)  CULTURE, BLOOD (ROUTINE X 2)  ETHANOL  APTT  RAPID URINE DRUG SCREEN, HOSP PERFORMED  LACTIC ACID, PLASMA  I-STAT CHEM 8, ED    EKG EKG Interpretation Date/Time:  Monday Jan 09 2024 18:10:13 EDT Ventricular Rate:  115 PR Interval:  140 QRS Duration:  86 QT Interval:  332 QTC Calculation: 460 R Axis:   39  Text Interpretation: Sinus tachycardia Consider left ventricular hypertrophy Confirmed by Early Glisson ([id number]) on 01/09/2024 6:25:23 PM  Radiology DG Chest Port 1 View Result Date: 01/09/2024 CLINICAL DATA:  Initial evaluation for possible sepsis. EXAM: PORTABLE CHEST 1 VIEW COMPARISON:  Prior radiograph from 08/01/2021. FINDINGS: Mild cardiomegaly, stable. Mediastinal silhouette within normal limits. Aortic atherosclerosis. Chronic elevation of the left hemidiaphragm. Streaky left basilar opacities could reflect atelectasis or possibly infiltrate. Suspected small left pleural effusion. Right lung is largely clear. No pulmonary edema. No pneumothorax. Visualized soft tissues and osseous structures demonstrate no acute finding. IMPRESSION: 1. Elevation of the left hemidiaphragm with associated streaky left basilar opacity. Atelectasis is favored, although infectious infiltrate could be considered in the correct clinical setting. 2. Suspected small left pleural effusion. 3.  Aortic Atherosclerosis (ICD10-I70.0). Electronically Signed   By: Virgia Griffins M.D.   On: 01/09/2024 19:30   CT HEAD WO CONTRAST Result Date: 01/09/2024 CLINICAL DATA:  Initial evaluation  for acute neuro deficit, stroke suspected. EXAM: CT HEAD WITHOUT CONTRAST TECHNIQUE: Contiguous axial  images were obtained from the base of the skull through the vertex without intravenous contrast. RADIATION DOSE REDUCTION: This exam was performed according to the departmental dose-optimization program which includes automated exposure control, adjustment of the mA and/or kV according to patient size and/or use of iterative reconstruction technique. COMPARISON:  Prior CT from 08/21/2021. FINDINGS: Brain: Cerebral volume within normal limits. Mild chronic microvascular ischemic disease for age. No acute intracranial hemorrhage. No acute large vessel territory infarct. No mass lesion or midline shift. No hydrocephalus or extra-axial fluid collection. Vascular: No abnormal hyperdense vessel. Skull: Scalp soft tissues and calvarium within normal limits. Sinuses/Orbits: Globes and orbital soft tissues within normal limits. Paranasal sinuses and mastoid air cells are largely clear. Other: None. IMPRESSION: 1. No acute intracranial abnormality. 2. Mild chronic microvascular ischemic disease for age. Electronically Signed   By: Virgia Griffins M.D.   On: 01/09/2024 19:27   MR BRAIN WO CONTRAST Result Date: 01/09/2024 CLINICAL DATA:  Initial evaluation for acute neuro deficit, stroke suspected. EXAM: MRI HEAD WITHOUT CONTRAST TECHNIQUE: Multiplanar, multiecho pulse sequences of the brain and surrounding structures were obtained without intravenous contrast. COMPARISON:  CT from earlier the same day. FINDINGS: Brain: Examination moderately degraded by motion artifact. Cerebral volume within normal limits. Patchy T2/FLAIR hyperintensity involving the supratentorial cerebral white matter, most likely related chronic microvascular ischemic disease, mild for age. No evidence for acute or subacute ischemia. No areas of chronic cortical infarction. No acute or chronic intracranial blood products. No mass lesion, midline shift or mass effect. No hydrocephalus or extra-axial fluid collection. Pituitary gland within normal  limits. Vascular: Right vertebral artery hypoplastic and not well seen on this motion degraded exam. Otherwise, major intracranial vascular flow voids are otherwise maintained. Skull and upper cervical spine: Craniocervical junction within normal limits. Postoperative changes noted within the visualized upper cervical spine. Bone marrow signal intensity grossly normal. No scalp soft tissue abnormality. Sinuses/Orbits: Prior bilateral ocular lens replacement. Paranasal sinuses are largely clear. No significant mastoid effusion. Other: None. IMPRESSION: 1. No acute intracranial abnormality. 2. Mild chronic microvascular ischemic disease for age. Electronically Signed   By: Virgia Griffins M.D.   On: 01/09/2024 19:25    Procedures Procedures    Medications Ordered in ED Medications  lactated ringers  infusion (1,000 mLs Intravenous New Bag/Given 01/09/24 1921)  cefTRIAXone  (ROCEPHIN ) 1 g in sodium chloride  0.9 % 100 mL IVPB (0 g Intravenous Stopped 01/09/24 2005)    ED Course/ Medical Decision Making/ A&P                                 Medical Decision Making Amount and/or Complexity of Data Reviewed Labs: ordered. Radiology: ordered.  Risk Prescription drug management.    This patient presents to the ED for concern of altered mental status, this involves an extensive number of treatment options, and is a complaint that carries with it a high risk of complications and morbidity.  The differential diagnosis includes throat, hypoglycemia, stroke, hemorrhage, tumor, seizure, electrolyte disturbance, worsening renal dysfunction, the patient evidently has some renal insufficiency and has seen Dr. Carrolyn Clan in the past   Co morbidities that complicate the patient evaluation  Diabetes and hypertension   Additional history obtained:  Additional history obtained from medical record and the paramedics External records from outside source obtained and reviewed including as above, there is no  family available and they did not answer the phone when I tried to call   Lab Tests:  I Ordered, and personally interpreted labs.  The pertinent results include: CBC and metabolic panel unremarkable, drug screen is negative, urinalysis is negative other than mild ketones, alcohol is negative, INR is normal, CBC is normal, metabolic panel without acute findings, lactate normal, COVID and flu negative   Imaging Studies ordered:  I ordered imaging studies including chest x-ray I independently visualized and interpreted imaging which showed no acute findings, mild atelectasis MRI normal - no stroke. I agree with the radiologist interpretation   Cardiac Monitoring: / EKG:  The patient was maintained on a cardiac monitor.  I personally viewed and interpreted the cardiac monitored which showed an underlying rhythm of: Initially tachycardic but this resolved spontaneously   Problem List / ED Course / Critical interventions / Medication management  The patient was initially given a gram of Rocephin  considering that there may be an infection given her slight altered mental status, that being said after she was given some medications including some IV fluids she became much more alert and is now answering questions appropriately interacting appropriately and stating that she had a significant stressful reaction due to an interaction with a close friend or family member that she was frustrated with.  At this time she states "I just worked through it" and is back to her normal self and requesting discharge.  I think this is reasonable, she is not tachycardic febrile and has no other signs of acute illness.  She is going to come back if things get worse.  Family members at the bedside are agreeable and will bring her back if things get worse.  She has medical decision making capacity` I have reviewed the patients home medicines and have made adjustments as needed    Social Determinants of  Health:  none   Test / Admission - Considered:  Considered admission but no acute medical findings and pt back to baseline.         Final Clinical Impression(s) / ED Diagnoses Final diagnoses:  Anxiety reaction    Rx / DC Orders ED Discharge Orders     None         Early Glisson, MD 01/09/24 2244

## 2024-01-09 NOTE — Progress Notes (Signed)
 CODE SEPSIS - PHARMACY COMMUNICATION  **Broad Spectrum Antibiotics should be administered within 1 hour of Sepsis diagnosis**  Time Code Sepsis Called/Page Received: 1854  Antibiotics Ordered: ceftriaxone   Time of 1st antibiotic administration: 1916  Additional action taken by pharmacy: None  If necessary, Name of Provider/Nurse Contacted: None    Ananias Balls ,PharmD Clinical Pharmacist  01/09/2024  6:58 PM

## 2024-01-09 NOTE — ED Notes (Addendum)
 Na 136 K- 4.2 Cl- 106 iCa- 0.99 TCO2- 20 Glu- 144 BUN- 21 Crea- 1.4 Hbg- 13.6 AnGap- 15

## 2024-01-09 NOTE — ED Triage Notes (Signed)
 Pt c/o generalized weakness x 5 days.

## 2024-01-09 NOTE — Sepsis Progress Note (Addendum)
 Elink monitoring for the code sepsis protocol.   Confirmed with Bedside RN that antibiotics were given before blood cultures were collected

## 2024-01-09 NOTE — Discharge Instructions (Signed)
 We have talked about your results, I have offered you admission to the hospital but you have decided to go home, I think this is reasonable as long as you come back if things get worse.  You seem to be much better than you were when you first got here, I think this is probably related to stress or anxiety.  Please follow-up with your doctor within 2 to 3 days.  ER for worsening symptoms

## 2024-01-10 LAB — I-STAT CHEM 8, ED
BUN: 21 mg/dL (ref 8–23)
Calcium, Ion: 0.9 mmol/L — ABNORMAL LOW (ref 1.15–1.40)
Chloride: 106 mmol/L (ref 98–111)
Creatinine, Ser: 1.4 mg/dL — ABNORMAL HIGH (ref 0.44–1.00)
Glucose, Bld: 144 mg/dL — ABNORMAL HIGH (ref 70–99)
HCT: 40 % (ref 36.0–46.0)
Hemoglobin: 13.6 g/dL (ref 12.0–15.0)
Potassium: 4.2 mmol/L (ref 3.5–5.1)
Sodium: 136 mmol/L (ref 135–145)
TCO2: 20 mmol/L — ABNORMAL LOW (ref 22–32)

## 2024-01-14 LAB — CULTURE, BLOOD (ROUTINE X 2)
Culture: NO GROWTH
Culture: NO GROWTH

## 2024-01-27 ENCOUNTER — Encounter: Payer: Self-pay | Admitting: Cardiology

## 2024-01-27 ENCOUNTER — Ambulatory Visit: Attending: Cardiology | Admitting: Cardiology

## 2024-01-27 VITALS — BP 116/60 | HR 62 | Ht 60.0 in | Wt 152.0 lb

## 2024-01-27 DIAGNOSIS — E782 Mixed hyperlipidemia: Secondary | ICD-10-CM | POA: Diagnosis not present

## 2024-01-27 DIAGNOSIS — I10 Essential (primary) hypertension: Secondary | ICD-10-CM

## 2024-01-27 DIAGNOSIS — I25119 Atherosclerotic heart disease of native coronary artery with unspecified angina pectoris: Secondary | ICD-10-CM | POA: Diagnosis not present

## 2024-01-27 DIAGNOSIS — R002 Palpitations: Secondary | ICD-10-CM | POA: Diagnosis not present

## 2024-01-27 NOTE — Progress Notes (Signed)
    Cardiology Office Note  Date: 01/27/2024   ID: Eliani, Leclere 1945-12-28, MRN [id number]  History of Present Illness: Nicole Horn is a 78 y.o. female last seen in October 2024.  She is here for a routine visit.  I reviewed the chart, she was seen in the ER back in May with an ill-defined episode of altered mental status, she actually does not recall the events of that day very well even at this point.  No acute findings were noted per workup by Dr. Annabell Key including brain MRI.  She states that she is back to baseline.  She does not report any angina or palpitations.  We went over her medications.  She reports compliance with therapy although did have to stop Jardiance  secondary to nausea.  Through diet she has lost about 10 pounds.  I reviewed her ECG today which shows normal sinus rhythm.  I reviewed her interval lab work as well.  Physical Exam: VS:  BP 116/60   Pulse 62   Ht 5' (1.524 m)   Wt 152 lb (68.9 kg)   SpO2 99%   BMI 29.69 kg/m , BMI Body mass index is 29.69 kg/m.  Wt Readings from Last 3 Encounters:  01/27/24 152 lb (68.9 kg)  01/09/24 165 lb 5.5 oz (75 kg)  12/09/23 164 lb 3.2 oz (74.5 kg)    General: Patient appears comfortable at rest. HEENT: Conjunctiva and lids normal. Neck: Supple, no elevated JVP or carotid bruits. Lungs: Clear to auscultation, nonlabored breathing at rest. Cardiac: Regular rate and rhythm, no S3, 1/6 systolic murmur. Extremities: No pitting edema.  ECG:  An ECG dated 01/09/2024 was personally reviewed today and demonstrated:  Sinus tachycardia with increased voltage.  Labwork: 12/09/2023: TSH 0.262 01/09/2024: ALT 32; AST 26; BUN 19; Creatinine, Ser 1.25; Hemoglobin 14.0; Platelets 274; Potassium 3.9; Sodium 134     Component Value Date/Time   CHOL 138 12/09/2023 1125   TRIG 102 12/09/2023 1125   HDL 43 12/09/2023 1125   CHOLHDL 3.2 12/09/2023 1125   CHOLHDL 2.4 08/10/2021 1218   LDLCALC 76 12/09/2023 1125    LDLCALC 50 08/10/2021 1218   Other Studies Reviewed Today:  No interval cardiac testing for review today.  Assessment and Plan:  1.  Primary hypertension.  Blood pressure is well-controlled today.  Continue current regimen including Aldactone  25 mg daily and Azor  10/20 mg daily.  Keep follow-up with PCP.   2.  Intermittent palpitations with brief episodes of SVT by cardiac monitor.  No worsening symptoms or frank syncope.  Continue Toprol -XL 12.5 mg daily.  ECG today is normal.   3.  CAD by history (reportedly heart attack with cardiac catheterization in 2004 in [place] , but no PCI) with plan to continue medical therapy in the absence of angina symptoms.  Currently on aspirin  81 mg daily, Plavix  75 mg daily, and Crestor  40 mg daily.   4.  Mixed hyperlipidemia.  LDL 76 in April.  Continue Crestor  40 mg daily.  Disposition:  Follow up 6 months.  Signed, Gerard Knight, M.D., F.A.C.C. [facility]

## 2024-01-27 NOTE — Patient Instructions (Signed)
 Medication Instructions:  Your physician recommends that you continue on your current medications as directed. Please refer to the Current Medication list given to you today.  *If you need a refill on your cardiac medications before your next appointment, please call your pharmacy*  Lab Work: NONE   If you have labs (blood work) drawn today and your tests are completely normal, you will receive your results only by: MyChart Message (if you have MyChart) OR A paper copy in the mail If you have any lab test that is abnormal or we need to change your treatment, we will call you to review the results.  Testing/Procedures: NONE   Follow-Up: At [facility], you and your health needs are our priority.  As part of our continuing mission to provide you with exceptional heart care, our providers are all part of one team.  This team includes your primary Cardiologist (physician) and Advanced Practice Providers or APPs (Physician Assistants and Nurse Practitioners) who all work together to provide you with the care you need, when you need it.  Your next appointment:   6 month(s)  Provider:   You may see Nona Dell, MD or one of the following Advanced Practice Providers on your designated Care Team:   Randall An, PA-C  [place], [place] Jacolyn Reedy, [place]     We recommend signing up for the patient portal called "MyChart".  Sign up information is provided on this After Visit Summary.  MyChart is used to connect with patients for Virtual Visits (Telemedicine).  Patients are able to view lab/test results, encounter notes, upcoming appointments, etc.  Non-urgent messages can be sent to your provider as well.   To learn more about what you can do with MyChart, go to [email or web address].   Other Instructions Thank you for choosing [facility]!

## 2024-01-31 ENCOUNTER — Ambulatory Visit

## 2024-01-31 VITALS — BP 110/62 | HR 74 | Ht 60.0 in | Wt 153.0 lb

## 2024-01-31 DIAGNOSIS — Z09 Encounter for follow-up examination after completed treatment for conditions other than malignant neoplasm: Secondary | ICD-10-CM

## 2024-01-31 DIAGNOSIS — F411 Generalized anxiety disorder: Secondary | ICD-10-CM

## 2024-01-31 NOTE — Progress Notes (Signed)
 Established Patient Office Visit  Subjective   Patient ID: Nicole Horn, female    DOB: July 07, 1946  Age: 78 y.o. MRN: [id number]  Chief Complaint  Patient presents with   Hospitalization Follow-up    Pt here for a ER follow up    HPI  Pt was seen at [facility] on 01/09/24 for below issues:     This patient is a 78 year old female, multiple medical problems including hypertension, diabetes, followed by Dr. Kermit Ped in the office, had been diagnosed with COVID-19 about 2 months ago.  Has not recently been admitted to the hospital.  She is presenting to the hospital today because of altered mental status and weakness.  Family is not present so the information is coming strictly from the paramedics.  I did try to call family at home but nobody answers.  The patient cannot tell me why she is here in fact when I asked her questions sometimes she just stares off and does not answer anything.  Sometimes she will answer a simple one-word question but if it takes her longer than 3 or 4 seconds to answer she just stares off in the distance.  She specifically denies any pain or shortness of breath or fevers chills nausea vomiting or diarrhea.  Report is that the patient has been feeling off for about 5 days, paramedics report that the patient would not walk for them but seem to be alert and awake.  Patient Active Problem List   Diagnosis Date Noted   Fatigue 12/09/2023   Persistent dry cough 09/09/2023   Screening for HIV (human immunodeficiency virus) 06/09/2023   Need for influenza vaccination 06/09/2023   Boil of vulva 01/11/2023   Acute lumbar back pain 10/06/2022   Chest pain 10/06/2022   Hidradenitis suppurativa of right axilla 10/06/2022   Encounter for well adult exam with abnormal findings 07/23/2022   Hepatic steatosis 01/21/2022   Epistaxis 01/21/2022   Compulsive gambling 11/25/2021   Concussion with no loss of consciousness 09/22/2021   Persistent headaches 09/22/2021    Frequent headaches 09/22/2021   Prediabetes 09/22/2021   LFTs abnormal 02/06/2021   Herpes 01/02/2021   Palpitations 11/27/2020   Depression, major, single episode, severe (HCC) 10/22/2020   Insomnia 10/22/2020   Caregiver role strain 07/23/2020   Primary osteoarthritis of first carpometacarpal joint of right hand 07/16/2020   Annual visit for general adult medical examination with abnormal findings 04/16/2020   Postmenopausal 04/16/2020   Screening mammogram, encounter for 04/16/2020   Atypical mole 04/16/2020   Need for immunization against influenza 04/16/2020   Constipation 03/17/2020   PVD (peripheral vascular disease) (HCC) 03/14/2020   CAD (coronary artery disease) 03/14/2020   Iron  deficiency anemia 03/14/2020   Dizziness 03/13/2020   DOE (dyspnea on exertion) 03/10/2020   H/O adenomatous polyp of colon 01/02/2020   FH: colon cancer 01/02/2020   Heart disease 12/11/2019   Upper airway cough syndrome 11/13/2019   Obesity (BMI 30-39.9) 10/16/2019   Essential hypertension 09/10/2019   Hyperlipemia 09/10/2019   OSA on CPAP 04/18/2019   PAD (peripheral artery disease) (HCC) 05/13/2017   Claudication (HCC) 02/21/2017   Type 2 diabetes mellitus with stage 2 chronic kidney disease, without long-term current use of insulin  (HCC) 06/09/2015   CKD stage 3a, GFR 45-59 ml/min (HCC) 03/06/2012      ROS    Objective:     BP 110/62   Pulse 74   Ht 5' (1.524 m)   Wt 153 lb (69.4 kg)  SpO2 94%   BMI 29.88 kg/m  BP Readings from Last 3 Encounters:  01/31/24 110/62  01/27/24 116/60  01/09/24 (!) 150/78   Wt Readings from Last 3 Encounters:  01/31/24 153 lb (69.4 kg)  01/27/24 152 lb (68.9 kg)  01/09/24 165 lb 5.5 oz (75 kg)     Physical Exam Vitals and nursing note reviewed.  Constitutional:      Appearance: Normal appearance.  Eyes:     Extraocular Movements: Extraocular movements intact.     Pupils: Pupils are equal, round, and reactive to light.   Cardiovascular:     Rate and Rhythm: Normal rate and regular rhythm.  Pulmonary:     Effort: Pulmonary effort is normal.     Breath sounds: Normal breath sounds.  Musculoskeletal:     Cervical back: Normal range of motion and neck supple.  Neurological:     Mental Status: She is alert and oriented to person, place, and time.  Psychiatric:        Mood and Affect: Mood normal.        Thought Content: Thought content normal.      No results found for any visits on 01/31/24.    The ASCVD Risk score (Arnett DK, et al., 2019) failed to calculate for the following reasons:   Risk score cannot be calculated because patient has a medical history suggesting prior/existing ASCVD    Assessment & Plan:   Problem List Items Addressed This Visit   None Visit Diagnoses       Anxiety reaction    -  Primary   Recent hospital records were reviewed.  These were unrevealing for cause of symptoms.  Patient denies any further symptoms.  Return in 3 months for f/u     Hospital discharge follow-up           No follow-ups on file.    Alison Irvine, FNP

## 2024-02-07 NOTE — Progress Notes (Signed)
Necessary

## 2024-03-06 ENCOUNTER — Ambulatory Visit (INDEPENDENT_AMBULATORY_CARE_PROVIDER_SITE_OTHER)

## 2024-03-06 VITALS — BP 103/64 | HR 84 | Ht 60.0 in | Wt 154.0 lb

## 2024-03-06 DIAGNOSIS — E1122 Type 2 diabetes mellitus with diabetic chronic kidney disease: Secondary | ICD-10-CM | POA: Diagnosis not present

## 2024-03-06 DIAGNOSIS — Z7984 Long term (current) use of oral hypoglycemic drugs: Secondary | ICD-10-CM

## 2024-03-06 DIAGNOSIS — N182 Chronic kidney disease, stage 2 (mild): Secondary | ICD-10-CM | POA: Diagnosis not present

## 2024-03-06 DIAGNOSIS — R7989 Other specified abnormal findings of blood chemistry: Secondary | ICD-10-CM

## 2024-03-06 NOTE — Progress Notes (Unsigned)
   Established Patient Office Visit  Subjective   Patient ID: ATHENA BALTZ, female    DOB: [birth year]  Age: 78 y.o. MRN: [id number]  Chief Complaint  Patient presents with   Medical Management of Chronic Issues    Follow up     HPI  Patient Active Problem List   Diagnosis Date Noted   Fatigue 12/09/2023   Persistent dry cough 09/09/2023   Screening for HIV (human immunodeficiency virus) 06/09/2023   Need for influenza vaccination 06/09/2023   Boil of vulva 01/11/2023   Acute lumbar back pain 10/06/2022   Chest pain 10/06/2022   Hidradenitis suppurativa of right axilla 10/06/2022   Encounter for well adult exam with abnormal findings 07/23/2022   Hepatic steatosis 01/21/2022   Epistaxis 01/21/2022   Compulsive gambling 11/25/2021   Concussion with no loss of consciousness 09/22/2021   Persistent headaches 09/22/2021   Frequent headaches 09/22/2021   Prediabetes 09/22/2021   LFTs abnormal 02/06/2021   Herpes 01/02/2021   Palpitations 11/27/2020   Depression, major, single episode, severe (HCC) 10/22/2020   Insomnia 10/22/2020   Caregiver role strain 07/23/2020   Primary osteoarthritis of first carpometacarpal joint of right hand 07/16/2020   Annual visit for general adult medical examination with abnormal findings 04/16/2020   Postmenopausal 04/16/2020   Screening mammogram, encounter for 04/16/2020   Atypical mole 04/16/2020   Need for immunization against influenza 04/16/2020   Constipation 03/17/2020   PVD (peripheral vascular disease) (HCC) 03/14/2020   CAD (coronary artery disease) 03/14/2020   Iron  deficiency anemia 03/14/2020   Dizziness 03/13/2020   DOE (dyspnea on exertion) 03/10/2020   H/O adenomatous polyp of colon 01/02/2020   FH: colon cancer 01/02/2020   Heart disease 12/11/2019   Upper airway cough syndrome 11/13/2019   Obesity (BMI 30-39.9) 10/16/2019   Essential hypertension 09/10/2019   Hyperlipemia 09/10/2019   OSA on CPAP  04/18/2019   PAD (peripheral artery disease) (HCC) 05/13/2017   Claudication (HCC) 02/21/2017   Type 2 diabetes mellitus with stage 2 chronic kidney disease, without long-term current use of insulin  (HCC) 06/09/2015   CKD stage 3a, GFR 45-59 ml/min (HCC) 03/06/2012      ROS    Objective:     BP 103/64   Pulse 84   Ht 5' (1.524 m)   Wt 154 lb 0.6 oz (69.9 kg)   SpO2 94%   BMI 30.08 kg/m  BP Readings from Last 3 Encounters:  03/06/24 103/64  01/31/24 110/62  01/27/24 116/60   Wt Readings from Last 3 Encounters:  03/06/24 154 lb 0.6 oz (69.9 kg)  01/31/24 153 lb (69.4 kg)  01/27/24 152 lb (68.9 kg)     Physical Exam   No results found for any visits on 03/06/24.  {Labs (Optional):23779}  The ASCVD Risk score (Arnett DK, et al., 2019) failed to calculate for the following reasons:   Risk score cannot be calculated because patient has a medical history suggesting prior/existing ASCVD    Assessment & Plan:   Problem List Items Addressed This Visit   None   No follow-ups on file.    Leita Longs, FNP

## 2024-03-07 LAB — CMP14+EGFR
ALT: 16 IU/L (ref 0–32)
AST: 15 IU/L (ref 0–40)
Albumin: 4.5 g/dL (ref 3.8–4.8)
Alkaline Phosphatase: 90 IU/L (ref 44–121)
BUN/Creatinine Ratio: 19 (ref 12–28)
BUN: 27 mg/dL (ref 8–27)
Bilirubin Total: 0.3 mg/dL (ref 0.0–1.2)
CO2: 21 mmol/L (ref 20–29)
Calcium: 10 mg/dL (ref 8.7–10.3)
Chloride: 105 mmol/L (ref 96–106)
Creatinine, Ser: 1.44 mg/dL — ABNORMAL HIGH (ref 0.57–1.00)
Globulin, Total: 3 g/dL (ref 1.5–4.5)
Glucose: 105 mg/dL — ABNORMAL HIGH (ref 70–99)
Potassium: 4.4 mmol/L (ref 3.5–5.2)
Sodium: 142 mmol/L (ref 134–144)
Total Protein: 7.5 g/dL (ref 6.0–8.5)
eGFR: 38 mL/min/1.73 — ABNORMAL LOW (ref >59)

## 2024-03-07 LAB — HEMOGLOBIN A1C
Est. average glucose Bld gHb Est-mCnc: 140 mg/dL
Hgb A1c MFr Bld: 6.5 % — ABNORMAL HIGH (ref 4.8–5.6)

## 2024-03-07 LAB — TSH+FREE T4
Free T4: 0.94 ng/dL (ref 0.82–1.77)
TSH: 0.378 u[IU]/mL — ABNORMAL LOW (ref 0.450–4.500)

## 2024-03-08 DIAGNOSIS — R7989 Other specified abnormal findings of blood chemistry: Secondary | ICD-10-CM | POA: Insufficient documentation

## 2024-03-08 NOTE — Assessment & Plan Note (Signed)
 She is currently prescribed Jardiance  10 mg and Metformin 500 mg bid.  Repeat A1c and CMP ordered today.

## 2024-03-08 NOTE — Assessment & Plan Note (Signed)
 Recheck thyroid labs

## 2024-03-20 ENCOUNTER — Telehealth: Payer: Self-pay

## 2024-03-20 ENCOUNTER — Ambulatory Visit: Payer: Self-pay

## 2024-03-20 ENCOUNTER — Other Ambulatory Visit: Payer: Self-pay

## 2024-03-20 DIAGNOSIS — R7989 Other specified abnormal findings of blood chemistry: Secondary | ICD-10-CM

## 2024-03-20 NOTE — Telephone Encounter (Signed)
 Copied from CRM #[id number]. Topic: Clinical - Lab/Test Results >> Mar 20, 2024  1:12 PM Nathanel BROCKS wrote: Reason for CRM: pt called about status of her lab results. Please advise

## 2024-04-04 ENCOUNTER — Other Ambulatory Visit: Payer: Self-pay

## 2024-04-04 ENCOUNTER — Encounter: Payer: Self-pay | Admitting: Hematology

## 2024-04-05 ENCOUNTER — Encounter: Payer: Self-pay | Admitting: "Endocrinology

## 2024-04-05 ENCOUNTER — Ambulatory Visit (INDEPENDENT_AMBULATORY_CARE_PROVIDER_SITE_OTHER): Payer: Self-pay | Admitting: "Endocrinology

## 2024-04-05 VITALS — BP 108/52 | HR 64 | Ht 60.0 in | Wt 155.2 lb

## 2024-04-05 DIAGNOSIS — E049 Nontoxic goiter, unspecified: Secondary | ICD-10-CM

## 2024-04-05 DIAGNOSIS — E0590 Thyrotoxicosis, unspecified without thyrotoxic crisis or storm: Secondary | ICD-10-CM

## 2024-04-05 NOTE — Progress Notes (Signed)
 Endocrinology Consult Note                                            04/05/2024, 2:50 PM   Subjective:    Patient ID: Nicole Horn, female    DOB: Jul 13, 1946, PCP Bevely Doffing, FNP   Past Medical History:  Diagnosis Date   Benign tumor of Bartholin's gland 02/26/2015   Chronic thumb pain, right    Essential hypertension    Heart attack [facility]) 2004   [place]  - hospitalized with stress test by report and presumably medical therapy; subsequent cardiac catheterization (no PCI)   History of COVID-19 08/2019   History of irregular heartbeat    History of syphilis    Mixed hyperlipidemia    PAD (peripheral artery disease) (HCC) 2019   PTCA/stent [place]    Pneumonia due to COVID-19 virus 09/10/2019   Sleep apnea    Pt supposed to wear CPAP, but doesn't.   Type 2 diabetes mellitus (HCC)    Past Surgical History:  Procedure Laterality Date   ABDOMINAL HYSTERECTOMY     BACK SURGERY     cervical fuision   BARTHOLIN GLAND CYST EXCISION Left 03/04/2015   Procedure: EXCISION OF LEFT BARTHOLINS TUMOR;  Surgeon: Norleen Edsel GAILS, MD;  Location: [facility];  Service: Gynecology;  Laterality: Left;   CATARACT EXTRACTION W/PHACO Right 03/27/2021   Procedure: CATARACT EXTRACTION PHACO AND INTRAOCULAR LENS PLACEMENT (IOC);  Surgeon: Harrie Agent, MD;  Location: [facility];  Service: Ophthalmology;  Laterality: Right;  cde 8.45   CATARACT EXTRACTION W/PHACO Left 11/06/2021   Procedure: CATARACT EXTRACTION PHACO AND INTRAOCULAR LENS PLACEMENT (IOC);  Surgeon: Harrie Agent, MD;  Location: [facility];  Service: Ophthalmology;  Laterality: Left;  CDE:13.69   CERVICAL SPINE SURGERY     COLONOSCOPY N/A 02/01/2020   Procedure: COLONOSCOPY;  Surgeon: Shaaron Lamar CHRISTELLA, MD; Scattered medium mouth diverticula in the sigmoid and descending colon, otherwise normal exam.   CYST REMOVAL TRUNK     ESOPHAGOGASTRODUODENOSCOPY (EGD) WITH PROPOFOL N/A 03/27/2020   Surgeon: Shaaron Lamar CHRISTELLA, MD;  normal  esophagus and stomach, 2 small duodenal erosions.   GIVENS CAPSULE STUDY N/A 03/27/2020   Surgeon: Shaaron Lamar CHRISTELLA, MD;  multiple small bowel angiectasia's   Multiple cyst removal surgeries     Stent of lower extremities     Social History   Socioeconomic History   Marital status: Widowed    Spouse name: Not on file   Number of children: 1   Years of education: Not on file   Highest education level: Some college, no degree  Occupational History   Not on file  Tobacco Use   Smoking status: Former    Current packs/day: 0.00    Average packs/day: 2.0 packs/day for 25.0 years (50.0 ttl pk-yrs)    Types: Cigarettes    Start date: 03/10/1960    Quit date: 03/10/1985    Years since quitting: 39.0   Smokeless tobacco: Never  Vaping Use   Vaping status: Never Used  Substance and Sexual Activity   Alcohol use: Yes    Comment: socially; rarely   Drug use: No   Sexual activity: Not Currently    Birth control/protection: Surgical    Comment: hyst  Other Topics Concern   Not on file  Social History Narrative   Lives in [place]  and [employer] 6 months here and there owns an apt in [place]   Lives alone, but helps to care for mother who is 92 years (goes between the kids)   Sister is in [place]   Brother is in [place]      Son who is 74 years old lives in [place], [place]    Has 6 grand kids and 7 great grands      Enjoys exercise (harder since COVID)-dancing      Diet: eats all foods-enjoys smoothies, avoids fried foods, limited red meat if any   Caffeine: coffee 1 cup in the morning-sometimes will have 1 in evening    Water: 3-4 bottles daily       Wears seat belt   Smoke detectors in home   Does not use phone while driving            Social Drivers of Health   Financial Resource Strain: Low Risk  (10/25/2022)   Overall Financial Resource Strain (CARDIA)    Difficulty of Paying Living Expenses: Not hard at all  Food Insecurity: No Food Insecurity (10/25/2022)   Hunger  Vital Sign    Worried About Running Out of Food in the Last Year: Never true    Ran Out of Food in the Last Year: Never true  Transportation Needs: No Transportation Needs (10/25/2022)   PRAPARE - Administrator, Civil Service (Medical): No    Lack of Transportation (Non-Medical): No  Physical Activity: Inactive (11/14/2023)   Exercise Vital Sign    Days of Exercise per Week: 0 days    Minutes of Exercise per Session: 0 min  Stress: Stress Concern Present (10/25/2022)   [employer] of Occupational Health - Occupational Stress Questionnaire    Feeling of Stress : To some extent  Social Connections: Moderately Integrated (10/25/2022)   Social Connection and Isolation Panel    Frequency of Communication with Friends and Family: More than three times a week    Frequency of Social Gatherings with Friends and Family: More than three times a week    Attends Religious Services: More than 4 times per year    Active Member of [employer] or Organizations: Yes    Attends Engineer, structural: More than 4 times per year    Marital Status: Separated   Family History  Problem Relation Age of Onset   Stroke Paternal Grandmother    Heart attack Paternal Grandmother    Colon cancer Mother        In her 31s   Healthy Brother    Breast cancer Sister    Stomach cancer Maternal Aunt    Outpatient Encounter Medications as of 04/05/2024  Medication Sig   vitamin C (ASCORBIC ACID) 500 MG tablet Take 500 mg by mouth 2 (two) times a week. (Patient taking differently: Take 500 mg by mouth daily.)   albuterol (VENTOLIN HFA) 108 (90 Base) MCG/ACT inhaler Inhale 1-2 puffs into the lungs every 6 (six) hours as needed for wheezing or shortness of breath.   amlodipine-olmesartan (AZOR) 10-20 MG tablet Take 1 tablet by mouth daily.   aspirin EC 81 MG tablet Take 1 tablet (81 mg total) by mouth daily. Swallow whole.   benzonatate (TESSALON PERLES) 100 MG capsule Take 1 capsule (100 mg total) by mouth  3 (three) times daily as needed for cough.   Blood Glucose Calibration (TRUE METRIX LEVEL 1) Low SOLN 1 application by In Vitro route daily Max Daily Amount: 1 application  Blood Glucose Monitoring Suppl (TRUE METRIX GO GLUCOSE METER) w/Device KIT by Does not apply route.   Calcium Carbonate Antacid (CALCIUM CARBONATE PO) Take 1,200 mg of elemental calcium by mouth in the morning.   cetirizine-pseudoephedrine (ZYRTEC-D) 5-120 MG tablet Take 1 tablet by mouth daily.   cholecalciferol (VITAMIN D3) 25 MCG (1000 UNIT) tablet Take 1,000 Units by mouth daily.   clopidogrel (PLAVIX) 75 MG tablet Take 75 mg by mouth daily.   Ferrous Sulfate (IRON PO) Take by mouth daily.   FLUoxetine (PROZAC) 10 MG capsule Take 1 capsule (10 mg total) by mouth daily.   fluticasone (FLONASE) 50 MCG/ACT nasal spray SPRAY 2 SPRAYS INTO EACH NOSTRIL EVERY DAY   metFORMIN (GLUCOPHAGE) 500 MG tablet Take 500 mg by mouth 2 (two) times daily.   metoprolol succinate (TOPROL XL) 25 MG 24 hr tablet Take 0.5 tablets (12.5 mg total) by mouth daily.   Multiple Vitamin (MULTIVITAMIN WITH MINERALS) TABS tablet Take 1 tablet by mouth in the morning. Centrum   Probiotic Product (DIGESTIVE ADVANTAGE) CAPS Take 1 capsule by mouth daily.   quinapril (ACCUPRIL) 20 MG tablet Take 20 mg by mouth daily.   rosuvastatin (CRESTOR) 40 MG tablet Take 1 tablet (40 mg total) by mouth daily.   silver sulfADIAZINE (SILVADENE) 1 % cream Apply 1 Application topically 2 (two) times daily. Apply bid to affected area externally   spironolactone (ALDACTONE) 25 MG tablet Take 1 tablet (25 mg total) by mouth daily. Please call [phone] to schedule an appointment with Dr. Jayson Sierras for future refills. Thank you.   valACYclovir (VALTREX) 500 MG tablet TAKE 1 TABLET (500 MG TOTAL) BY MOUTH DAILY.   No facility-administered encounter medications on file as of 04/05/2024.   ALLERGIES: No Known Allergies  VACCINATION STATUS: Immunization History   Administered Date(s) Administered   Fluad Quad(high Dose 65+) 06/19/2022   Fluad Trivalent(High Dose 65+) 06/09/2023   Influenza Split 07/06/2010   Influenza, High Dose Seasonal PF 06/09/2015, 06/23/2016, 07/30/2019   Influenza, Seasonal, Injecte, Preservative Fre 07/06/2010   Influenza,inj,Quad PF,6+ Mos 04/16/2020   Influenza-Unspecified 06/15/2013, 06/09/2015, 06/23/2016, 06/19/2019, 07/30/2019, 07/08/2021   Moderna SARS-COV2 Booster Vaccination 07/08/2021, 06/19/2022   Moderna Sars-Covid-2 Vaccination 11/02/2019, 12/05/2019   PNEUMOCOCCAL CONJUGATE-20 07/23/2022, 03/29/2023   Pneumococcal Conjugate-13 06/23/2016   Pneumococcal Polysaccharide-23 10/19/2017   Zoster Recombinant(Shingrix) 12/05/2021, 03/29/2023   Zoster, Live 10/19/2017    HPI BETHANNY TOELLE is 78 y.o. female who presents today with a medical history as above. she is being seen in consultation for subclinical hyperthyroidism requested by Bevely Doffing, FNP.  - History is obtained from the chart review and from the patient.  She denies any prior history of thyroid dysfunction.  In April 2025 she was found to have suppressed TSH associated with normal free T4.  This pattern was repeated on March 06, 2024 when she had TSH slightly suppressed at 0.37 associated with normal free T4 of 0.94.    - Patient denies any exposure to thyroid hormone or supplements.  She has some recent weight loss due to hospitalization.  She denies any dysphagia, shortness of breath, nor voice change.  She thinks her thyroid is enlarging.  She has no family history of thyroid dysfunction or thyroid malignancy.  She denies palpitations, heat intolerance, nor tremors. Her other medical problems include hypertension, hyperlipidemia on treatment.  She also has controlled type 2 diabetes on metformin 500 mg p.o. twice daily, with recent A1c of 6.5%. She reports significant amount of stress taking care  of her elderly mother.  Review of  Systems  Constitutional: +mildly fluctuating body weight, no fatigue, no subjective hyperthermia, no subjective hypothermia Eyes: no blurry vision, no xerophthalmia ENT: no sore throat, no nodules palpated in throat, no dysphagia/odynophagia, no hoarseness Cardiovascular: no Chest Pain, no Shortness of Breath, no palpitations, no leg swelling Respiratory: no cough, no shortness of breath Gastrointestinal: no Nausea/Vomiting/Diarhhea Musculoskeletal: no muscle/joint aches Skin: no rashes Neurological: no tremors, no numbness, no tingling, no dizziness Psychiatric: no depression, no anxiety  Objective:       04/05/2024    2:03 PM 03/06/2024    3:50 PM 03/06/2024    3:49 PM  Vitals with BMI  Height 5' 0  5' 0  Weight 155 lbs 3 oz  154 lbs 1 oz  BMI 30.31  30.08  Systolic 108 103 97  Diastolic 52 64 59  Pulse 64  84    BP (!) 108/52   Pulse 64   Ht 5' (1.524 m)   Wt 155 lb 3.2 oz (70.4 kg)   BMI 30.31 kg/m   Wt Readings from Last 3 Encounters:  04/05/24 155 lb 3.2 oz (70.4 kg)  03/06/24 154 lb 0.6 oz (69.9 kg)  01/31/24 153 lb (69.4 kg)    Physical Exam  Constitutional:  Body mass index is 30.31 kg/m.,  not in acute distress, normal state of mind Eyes: PERRLA, EOMI, no exophthalmos ENT: moist mucous membranes, + gross thyromegaly, no gross cervical lymphadenopathy Cardiovascular: normal precordial activity, Regular Rate and Rhythm, no Murmur/Rubs/Gallops Respiratory:  adequate breathing efforts, no gross chest deformity, Clear to auscultation bilaterally Gastrointestinal: abdomen soft, Non -tender, No distension, Bowel Sounds present, no gross organomegaly Musculoskeletal: no gross deformities, strength intact in all four extremities, no peripheral edema Skin: moist, warm, no rashes Neurological: no tremor with outstretched hands, Deep tendon reflexes normal in bilateral lower extremities.  CMP ( most recent) CMP     Component Value Date/Time   NA 142 03/06/2024  1643   K 4.4 03/06/2024 1643   CL 105 03/06/2024 1643   CO2 21 03/06/2024 1643   GLUCOSE 105 (H) 03/06/2024 1643   GLUCOSE 135 (H) 01/09/2024 2021   BUN 27 03/06/2024 1643   CREATININE 1.44 (H) 03/06/2024 1643   CREATININE 1.10 (H) 08/10/2021 1218   CALCIUM 10.0 03/06/2024 1643   PROT 7.5 03/06/2024 1643   ALBUMIN 4.5 03/06/2024 1643   AST 15 03/06/2024 1643   ALT 16 03/06/2024 1643   ALKPHOS 90 03/06/2024 1643   BILITOT 0.3 03/06/2024 1643   EGFR 38 (L) 03/06/2024 1643   GFRNONAA 45 (L) 01/09/2024 2021   GFRNONAA 47 (L) 12/12/2019 1021     Diabetic Labs (most recent): Lab Results  Component Value Date   HGBA1C 6.5 (H) 03/06/2024   HGBA1C 7.9 (H) 12/09/2023   HGBA1C 7.2 (H) 06/09/2023     Lipid Panel ( most recent) Lipid Panel     Component Value Date/Time   CHOL 138 12/09/2023 1125   TRIG 102 12/09/2023 1125   HDL 43 12/09/2023 1125   CHOLHDL 3.2 12/09/2023 1125   CHOLHDL 2.4 08/10/2021 1218   LDLCALC 76 12/09/2023 1125   LDLCALC 50 08/10/2021 1218   LABVLDL 19 12/09/2023 1125      Lab Results  Component Value Date   TSH 0.378 (L) 03/06/2024   TSH 0.262 (L) 12/09/2023   TSH 0.666 06/09/2023   FREET4 0.94 03/06/2024   FREET4 0.87 12/09/2023   FREET4 0.92 06/09/2023  Assessment & Plan:   1. Subclinical hyperthyroidism (Primary) 2. Goiter  - JESLIE LOWE  is being seen at a kind request of Bevely Doffing, FNP. - I have reviewed her available  records and clinically evaluated the patient. - Based on these reviews, she has 2 documented measurements of suppressed TSH associated with low normal free T4,  however,  there is not sufficient information to proceed with definitive treatment plan.  - she will need a repeat,  more complete thyroid  function test towards confirming a diagnosis.  -She will be sent for TSH, free T4, total T3 and antithyroid antibodies. - -In light of her clinical goiter, she is also offered baseline thyroid   ultrasound. - If she returns with significant evidence of hyperthyroidism, she may need thyroid  uptake and scan. - If she presents with an acute focal evidence of primary hyperthyroidism, she would be offered treatment options.  - I did not initiate any new prescriptions today. She is advised to continue her other medications as prescribed. - she is advised to maintain close follow up with Bevely Doffing, FNP for primary care needs.   -Thank you for involving me in the care of this pleasant patient.  Time spent with the patient: 45  minutes spent in  counseling her about subclinical hyperthyroidism and the rest in obtaining information about her symptoms, reviewing her previous labs/studies (including abstractions from other facilities),  evaluations, and treatments,  and developing a plan to confirm diagnosis and long term treatment based on the latest standards of care/guidelines; and documenting her care.  Aaleyah Witherow Webber-Piskiel participated in the discussions, expressed understanding, and voiced agreement with the above plans.  All questions were answered to her satisfaction. she is encouraged to contact clinic should she have any questions or concerns prior to her return visit.  Follow up plan: Return in about 2 weeks (around 04/19/2024) for Labs Today- Non-Fasting Ok, Thyroid  / Neck Ultrasound.   Ranny Earl, MD [facility] [address] [place], [place] [address] Phone: [phone]  Fax: [phone]     04/05/2024, 2:50 PM  This note was partially dictated with voice recognition software. Similar sounding words can be transcribed inadequately or may not  be corrected upon review.

## 2024-04-11 ENCOUNTER — Ambulatory Visit (HOSPITAL_COMMUNITY)
Admission: RE | Admit: 2024-04-11 | Discharge: 2024-04-11 | Disposition: A | Discharge status: Home / Self Care | Source: Ambulatory Visit | Attending: "Endocrinology | Admitting: "Endocrinology

## 2024-04-11 DIAGNOSIS — E042 Nontoxic multinodular goiter: Secondary | ICD-10-CM | POA: Diagnosis not present

## 2024-04-11 DIAGNOSIS — E049 Nontoxic goiter, unspecified: Secondary | ICD-10-CM | POA: Diagnosis not present

## 2024-04-24 ENCOUNTER — Ambulatory Visit: Admitting: "Endocrinology

## 2024-04-25 ENCOUNTER — Ambulatory Visit

## 2024-04-26 DIAGNOSIS — E785 Hyperlipidemia, unspecified: Secondary | ICD-10-CM | POA: Diagnosis not present

## 2024-04-26 DIAGNOSIS — I519 Heart disease, unspecified: Secondary | ICD-10-CM | POA: Diagnosis not present

## 2024-04-26 DIAGNOSIS — E669 Obesity, unspecified: Secondary | ICD-10-CM | POA: Diagnosis not present

## 2024-04-26 DIAGNOSIS — E0590 Thyrotoxicosis, unspecified without thyrotoxic crisis or storm: Secondary | ICD-10-CM | POA: Diagnosis not present

## 2024-04-26 DIAGNOSIS — I10 Essential (primary) hypertension: Secondary | ICD-10-CM | POA: Diagnosis not present

## 2024-04-26 DIAGNOSIS — E1122 Type 2 diabetes mellitus with diabetic chronic kidney disease: Secondary | ICD-10-CM | POA: Diagnosis not present

## 2024-04-26 DIAGNOSIS — R7989 Other specified abnormal findings of blood chemistry: Secondary | ICD-10-CM | POA: Diagnosis not present

## 2024-04-26 DIAGNOSIS — R058 Other specified cough: Secondary | ICD-10-CM | POA: Diagnosis not present

## 2024-04-27 ENCOUNTER — Ambulatory Visit
Admission: EM | Admit: 2024-04-27 | Discharge: 2024-04-27 | Disposition: A | Discharge status: Home / Self Care | Attending: Family Medicine | Admitting: Family Medicine

## 2024-04-27 DIAGNOSIS — J22 Unspecified acute lower respiratory infection: Secondary | ICD-10-CM | POA: Diagnosis not present

## 2024-04-27 LAB — POC SOFIA SARS ANTIGEN FIA: SARS Coronavirus 2 Ag: NEGATIVE

## 2024-04-27 LAB — TSH: TSH: 0.298 u[IU]/mL — ABNORMAL LOW (ref 0.450–4.500)

## 2024-04-27 LAB — T3: T3, Total: 135 ng/dL (ref 71–180)

## 2024-04-27 LAB — T4, FREE: Free T4: 0.96 ng/dL (ref 0.82–1.77)

## 2024-04-27 LAB — THYROID STIMULATING IMMUNOGLOBULIN: Thyroid Stim Immunoglobulin: <0.1 IU/L (ref 0.00–0.55)

## 2024-04-27 LAB — THYROID PEROXIDASE ANTIBODY: Thyroperoxidase Ab SerPl-aCnc: <9 [IU]/mL (ref 0–34)

## 2024-04-27 LAB — THYROGLOBULIN ANTIBODY: Thyroglobulin Antibody: <1 [IU]/mL (ref 0.0–0.9)

## 2024-04-27 MED ORDER — BENZONATATE 200 MG PO CAPS
200.0000 mg | ORAL_CAPSULE | Freq: Three times a day (TID) | ORAL | 0 refills | Status: AC | PRN
Start: 2024-04-27 — End: ?

## 2024-04-27 MED ORDER — AZITHROMYCIN 250 MG PO TABS: ORAL_TABLET | ORAL | 0 refills | Status: AC

## 2024-04-27 NOTE — ED Provider Notes (Signed)
 [facility]    CSN: [id number] Arrival date & time: 04/27/24  1447      History   Chief Complaint No chief complaint on file.   HPI Nicole Horn is a 78 y.o. female.   Patient presenting today with 1 week history of progressively worsening fatigue, weakness, productive cough, chest discomfort, nausea.  Denies fever, chills, chest pain, shortness of breath, abdominal pain, diarrhea.  So far has been trying Tylenol , hot teas with minimal relief.  Does have an albuterol  inhaler at home due to history of wheezing but has not tried it yet.    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   Type 2 diabetes mellitus [facility])     Patient Active Problem List   Diagnosis Date Noted   Elevated TSH 03/08/2024   Fatigue 12/09/2023   Persistent dry cough 09/09/2023   Screening for HIV (human immunodeficiency virus) 06/09/2023   Need for influenza vaccination 06/09/2023   Boil of vulva 01/11/2023   Acute lumbar back pain 10/06/2022   Chest pain 10/06/2022   Hidradenitis suppurativa of right axilla 10/06/2022   Encounter for well adult exam with abnormal findings 07/23/2022   Hepatic steatosis 01/21/2022   Epistaxis 01/21/2022   Compulsive gambling 11/25/2021   Concussion with no loss of consciousness 09/22/2021   Persistent headaches 09/22/2021   Frequent headaches 09/22/2021   Prediabetes 09/22/2021   LFTs abnormal 02/06/2021   Herpes  01/02/2021   Palpitations 11/27/2020   Depression, major, single episode, severe (HCC) 10/22/2020   Insomnia 10/22/2020   Caregiver role strain 07/23/2020   Primary osteoarthritis of first carpometacarpal joint of right hand 07/16/2020   Annual visit for general adult medical examination with abnormal findings 04/16/2020   Postmenopausal 04/16/2020   Screening mammogram, encounter for 04/16/2020   Atypical mole 04/16/2020   Need for immunization against influenza 04/16/2020   Constipation 03/17/2020   PVD (peripheral vascular disease) (HCC) 03/14/2020   CAD (coronary artery disease) 03/14/2020   Iron  deficiency anemia 03/14/2020   Dizziness 03/13/2020   DOE (dyspnea on exertion) 03/10/2020   H/O adenomatous polyp of colon 01/02/2020   FH: colon cancer 01/02/2020   Heart disease 12/11/2019   Upper airway cough syndrome 11/13/2019   Obesity (BMI 30-39.9) 10/16/2019   Essential hypertension 09/10/2019   Hyperlipemia 09/10/2019   OSA on CPAP 04/18/2019   PAD (peripheral artery disease) (HCC) 05/13/2017   Claudication (HCC) 02/21/2017   Type 2 diabetes mellitus with stage 2 chronic kidney disease, without long-term current use of insulin  (HCC) 06/09/2015   CKD stage 3a, GFR 45-59 ml/min (HCC) 03/06/2012    Past Surgical History:  Procedure Laterality Date   ABDOMINAL HYSTERECTOMY     BACK SURGERY     cervical fuision   BARTHOLIN GLAND CYST EXCISION Left 03/04/2015   Procedure: EXCISION OF LEFT BARTHOLINS TUMOR;  Surgeon: Norleen Edsel GAILS, MD;  Location:  [facility];  Service: Gynecology;  Laterality: Left;   CATARACT EXTRACTION W/PHACO Right 03/27/2021   Procedure: CATARACT EXTRACTION PHACO AND INTRAOCULAR LENS PLACEMENT (IOC);  Surgeon: Harrie Agent, MD;  Location: [facility];  Service: Ophthalmology;  Laterality: Right;  cde 8.45   CATARACT EXTRACTION W/PHACO Left 11/06/2021   Procedure: CATARACT EXTRACTION PHACO AND INTRAOCULAR LENS PLACEMENT (IOC);  Surgeon: Harrie Agent, MD;   Location: [facility];  Service: Ophthalmology;  Laterality: Left;  CDE:13.69   CERVICAL SPINE SURGERY     COLONOSCOPY N/A 02/01/2020   Procedure: COLONOSCOPY;  Surgeon: Shaaron Lamar HERO, MD; Scattered medium mouth diverticula in the sigmoid and descending colon, otherwise normal exam.   CYST REMOVAL TRUNK     ESOPHAGOGASTRODUODENOSCOPY (EGD) WITH PROPOFOL  N/A 03/27/2020   Surgeon: Shaaron Lamar HERO, MD;  normal esophagus and stomach, 2 small duodenal erosions.   GIVENS CAPSULE STUDY N/A 03/27/2020   Surgeon: Shaaron Lamar HERO, MD;  multiple small bowel angiectasia's   Multiple cyst removal surgeries     Stent of lower extremities      OB History     Gravida  1   Para  1   Term      Preterm  1   AB      Living  1      SAB      IAB      Ectopic      Multiple      Live Births  1            Home Medications    Prior to Admission medications   Medication Sig Start Date End Date Taking? Authorizing Provider  azithromycin  (ZITHROMAX ) 250 MG tablet Take first 2 tablets together, then 1 every day until finished. 04/27/24  Yes Stuart Vernell Norris, PA-C  benzonatate  (TESSALON ) 200 MG capsule Take 1 capsule (200 mg total) by mouth 3 (three) times daily as needed for cough. 04/27/24  Yes Stuart Vernell Norris, PA-C  albuterol  (VENTOLIN  HFA) 108 (90 Base) MCG/ACT inhaler Inhale 1-2 puffs into the lungs every 6 (six) hours as needed for wheezing or shortness of breath.    [provider]  amlodipine -olmesartan  (AZOR ) 10-20 MG tablet Take 1 tablet by mouth daily. 12/09/23   Melvenia Manus BRAVO, MD  aspirin  EC 81 MG tablet Take 1 tablet (81 mg total) by mouth daily. Swallow whole. 05/06/23   Melvenia Manus BRAVO, MD  benzonatate  (TESSALON  PERLES) 100 MG capsule Take 1 capsule (100 mg total) by mouth 3 (three) times daily as needed for cough. 11/07/23   Melvenia Manus BRAVO, MD  Blood Glucose Calibration (TRUE METRIX LEVEL 1) Low SOLN 1 application by In Vitro route daily Max Daily Amount: 1  application 11/01/17   [provider]  Blood Glucose Monitoring Suppl (TRUE METRIX GO GLUCOSE METER) w/Device KIT by Does not apply route. 10/28/17   [provider]  Calcium  Carbonate Antacid (CALCIUM  CARBONATE PO) Take 1,200 mg of elemental calcium  by mouth in the morning.    [provider]  cetirizine -pseudoephedrine  (ZYRTEC -D) 5-120 MG tablet Take 1 tablet by mouth daily. 07/03/23   Leath-Warren, Etta PARAS, NP  cholecalciferol (VITAMIN D3) 25 MCG (1000 UNIT) tablet Take 1,000 Units by mouth daily.    [provider]  clopidogrel  (PLAVIX ) 75 MG tablet Take 75 mg by mouth daily.    [provider]  Ferrous Sulfate  (IRON  PO) Take by mouth daily.    [provider]  FLUoxetine  (PROZAC ) 10 MG capsule Take 1  capsule (10 mg total) by mouth daily. 12/09/23   Melvenia Manus BRAVO, MD  fluticasone  (FLONASE ) 50 MCG/ACT nasal spray SPRAY 2 SPRAYS INTO EACH NOSTRIL EVERY DAY 12/02/23   Dixon, Phillip E, MD  metFORMIN (GLUCOPHAGE) 500 MG tablet Take 500 mg by mouth 2 (two) times daily.    [provider]  metoprolol  succinate (TOPROL  XL) 25 MG 24 hr tablet Take 0.5 tablets (12.5 mg total) by mouth daily. 05/06/23   Debera Jayson MATSU, MD  Multiple Vitamin (MULTIVITAMIN WITH MINERALS) TABS tablet Take 1 tablet by mouth in the morning. Centrum    [provider]  Probiotic Product (DIGESTIVE ADVANTAGE) CAPS Take 1 capsule by mouth daily.    [provider]  quinapril  (ACCUPRIL ) 20 MG tablet Take 20 mg by mouth daily.    [provider]  rosuvastatin  (CRESTOR ) 40 MG tablet Take 1 tablet (40 mg total) by mouth daily. 06/23/23   Melvenia Manus BRAVO, MD  silver  sulfADIAZINE  (SILVADENE ) 1 % cream Apply 1 Application topically 2 (two) times daily. Apply bid to affected area externally 01/11/23   Signa Delon LABOR, NP  spironolactone  (ALDACTONE ) 25 MG tablet Take 1 tablet (25 mg total) by mouth daily. Please call [phone] to schedule  an appointment with Dr. Jayson Debera for future refills. Thank you. 05/25/23   Debera Jayson MATSU, MD  valACYclovir  (VALTREX ) 500 MG tablet TAKE 1 TABLET (500 MG TOTAL) BY MOUTH DAILY. 07/13/23   Jayne Vonn DEL, MD  vitamin C (ASCORBIC ACID ) 500 MG tablet Take 500 mg by mouth 2 (two) times a week. Patient taking differently: Take 500 mg by mouth daily.    [provider]    Family History Family History  Problem Relation Age of Onset   Stroke Paternal Grandmother    Heart attack Paternal Grandmother    Colon cancer Mother        In her 36s   Healthy Brother    Breast cancer Sister    Stomach cancer Maternal Aunt     Social History Social History   Tobacco Use   Smoking status: Former    Current packs/day: 0.00    Average packs/day: 2.0 packs/day for 25.0 years (50.0 ttl pk-yrs)    Types: Cigarettes    Start date: 03/10/1960    Quit date: 03/10/1985    Years since quitting: 39.1   Smokeless tobacco: Never  Vaping Use   Vaping status: Never Used  Substance Use Topics   Alcohol use: Yes    Comment: socially; rarely   Drug use: No     Allergies   Patient has no known allergies.   Review of Systems Review of Systems Per HPI  Physical Exam Triage Vital Signs ED Triage Vitals  Encounter Vitals Group     BP 04/27/24 1518 (!) 94/58     Girls Systolic BP Percentile --      Girls Diastolic BP Percentile --      Boys Systolic BP Percentile --      Boys Diastolic BP Percentile --      Pulse Rate 04/27/24 1518 64     Resp 04/27/24 1518 20     Temp 04/27/24 1518 98.2 F (36.8 C)     Temp Source 04/27/24 1518 Oral     SpO2 04/27/24 1518 94 %     Weight --      Height --      Head Circumference --      Peak Flow --  Pain Score 04/27/24 1515 0     Pain Loc --      Pain Education --      Exclude from Growth Chart --    No data found.  Updated Vital Signs BP (!) 94/58 (BP Location: Right Arm)   Pulse 64   Temp 98.2 F (36.8 C) (Oral)   Resp 20    SpO2 94%   Visual Acuity Right Eye Distance:   Left Eye Distance:   Bilateral Distance:    Right Eye Near:   Left Eye Near:    Bilateral Near:     Physical Exam Vitals and nursing note reviewed.  Constitutional:      Appearance: Normal appearance.  HENT:     Head: Atraumatic.     Right Ear: Tympanic membrane and external ear normal.     Left Ear: Tympanic membrane and external ear normal.     Nose: Rhinorrhea present.     Mouth/Throat:     Mouth: Mucous membranes are moist.     Pharynx: Posterior oropharyngeal erythema present.  Eyes:     Extraocular Movements: Extraocular movements intact.     Conjunctiva/sclera: Conjunctivae normal.  Cardiovascular:     Rate and Rhythm: Normal rate and regular rhythm.     Heart sounds: Normal heart sounds.  Pulmonary:     Effort: Pulmonary effort is normal.     Breath sounds: Normal breath sounds. No wheezing or rales.  Musculoskeletal:        General: Normal range of motion.     Cervical back: Normal range of motion and neck supple.  Skin:    General: Skin is warm and dry.  Neurological:     Mental Status: She is alert and oriented to person, place, and time.  Psychiatric:        Mood and Affect: Mood normal.        Thought Content: Thought content normal.      UC Treatments / Results  Labs (all labs ordered are listed, but only abnormal results are displayed) Labs Reviewed  POC SOFIA SARS ANTIGEN FIA - Normal    EKG   Radiology No results found.  Procedures Procedures (including critical care time)  Medications Ordered in UC Medications - No data to display  Initial Impression / Assessment and Plan / UC Course  I have reviewed the triage vital signs and the nursing notes.  Pertinent labs & imaging results that were available during my care of the patient were reviewed by me and considered in my medical decision making (see chart for details).     Given duration and worsening course of symptoms, will cover  with Zithromax , Tessalon  Perles, supportive over-the-counter medications at home care.  Rapid COVID was negative, unclear if viral versus allergic etiology at this time.  Final Clinical Impressions(s) / UC Diagnoses   Final diagnoses:  Lower respiratory infection     Discharge Instructions      In addition to the prescribed medications, you may take Coricidin HBP, plain Mucinex , nasal spray such as Astelin or Flonase , saline sinus rinses.  Follow-up for worsening or unresolving symptoms.    ED Prescriptions     Medication Sig Dispense Auth. Provider   azithromycin  (ZITHROMAX ) 250 MG tablet Take first 2 tablets together, then 1 every day until finished. 6 tablet Stuart Vernell Norris, PA-C   benzonatate  (TESSALON ) 200 MG capsule Take 1 capsule (200 mg total) by mouth 3 (three) times daily as needed for cough. 20 capsule Stuart,  Vernell Norris, PA-C      PDMP not reviewed this encounter.   Stuart Vernell Norris, [place] 04/27/24 1625

## 2024-04-27 NOTE — ED Triage Notes (Signed)
 Pt reports she has a cough, fatigue, weak,  nasuea, and chest discomfort x 1 week

## 2024-04-27 NOTE — Discharge Instructions (Signed)
 In addition to the prescribed medications, you may take Coricidin HBP, plain Mucinex , nasal spray such as Astelin or Flonase , saline sinus rinses.  Follow-up for worsening or unresolving symptoms.

## 2024-05-01 ENCOUNTER — Ambulatory Visit: Attending: Cardiology | Admitting: Cardiology

## 2024-05-01 ENCOUNTER — Encounter: Payer: Self-pay | Admitting: Cardiology

## 2024-05-01 VITALS — BP 120/66 | HR 55 | Ht 60.0 in | Wt 155.2 lb

## 2024-05-01 DIAGNOSIS — I25119 Atherosclerotic heart disease of native coronary artery with unspecified angina pectoris: Secondary | ICD-10-CM | POA: Diagnosis not present

## 2024-05-01 DIAGNOSIS — R002 Palpitations: Secondary | ICD-10-CM

## 2024-05-01 DIAGNOSIS — I10 Essential (primary) hypertension: Secondary | ICD-10-CM

## 2024-05-01 DIAGNOSIS — E782 Mixed hyperlipidemia: Secondary | ICD-10-CM | POA: Diagnosis not present

## 2024-05-01 MED ORDER — METOPROLOL SUCCINATE ER 25 MG PO TB24: 12.5000 mg | ORAL_TABLET | Freq: Every day | ORAL | 3 refills | Status: AC

## 2024-05-01 NOTE — Patient Instructions (Signed)
 Medication Instructions:  Your physician recommends that you continue on your current medications as directed. Please refer to the Current Medication list given to you today.   Labwork: None today  Testing/Procedures: None today  Follow-Up: 1 year  Any Other Special Instructions Will Be Listed Below (If Applicable).  If you need a refill on your cardiac medications before your next appointment, please call your pharmacy.

## 2024-05-01 NOTE — Progress Notes (Signed)
    Cardiology Office Note  Date: 05/01/2024   ID: Nicole Horn, DOB March 12, 1946, MRN [id number]  History of Present Illness: Nicole Horn is a 78 y.o. female last seen in June.  She is here for a follow-up visit.  Reports no reproducible, exertional chest pain. Was seen at urgent care recently for evaluation of suspected URI, COVID-19 negative.  She feels better now, less chest congestion.  Was prescribed a course of azithromycin  and cough suppressant.  She does not describe any worsening palpitations, no sudden dizziness or syncope.  Sometimes does feel jittery when she gets emotionally upset.  Medications reviewed.  No changes from a cardiac perspective.  I did review her interval lab work and ECG.  Physical Exam: VS:  BP 120/66 (BP Location: Left Arm, Cuff Size: Normal)   Pulse (!) 55   Ht 5' (1.524 m)   Wt 155 lb 3.2 oz (70.4 kg)   SpO2 97%   BMI 30.31 kg/m , BMI Body mass index is 30.31 kg/m.  Wt Readings from Last 3 Encounters:  05/01/24 155 lb 3.2 oz (70.4 kg)  04/05/24 155 lb 3.2 oz (70.4 kg)  03/06/24 154 lb 0.6 oz (69.9 kg)    General: Patient appears comfortable at rest.  Wearing a mask. HEENT: Conjunctiva and lids normal. Lungs: Clear to auscultation, nonlabored breathing at rest. Cardiac: Regular rate and rhythm, no S3 or significant systolic murmur. Extremities: No pitting edema.  ECG:  An ECG dated 01/27/2024 was personally reviewed today and demonstrated:  Sinus rhythm.  Labwork: 01/09/2024: Hemoglobin 14.0; Platelets 274 03/06/2024: ALT 16; AST 15; BUN 27; Creatinine, Ser 1.44; Potassium 4.4; Sodium 142 04/26/2024: TSH 0.298     Component Value Date/Time   CHOL 138 12/09/2023 1125   TRIG 102 12/09/2023 1125   HDL 43 12/09/2023 1125   CHOLHDL 3.2 12/09/2023 1125   CHOLHDL 2.4 08/10/2021 1218   LDLCALC 76 12/09/2023 1125   LDLCALC 50 08/10/2021 1218   Other Studies Reviewed Today:  No interval cardiac testing for review  today.  Assessment and Plan:  1.  Primary hypertension.  Blood pressure control is adequate today.  She is on Azor  10/20 mg daily and Aldactone  25 mg daily.  Keep follow-up with PCP.   2.  Intermittent palpitations with brief episodes of SVT by cardiac monitor.  Would continue Toprol -XL 12.5 mg daily for now, she has tolerated this well.  Recent ECG reviewed.   3.  CAD by history (reportedly heart attack with cardiac catheterization in 2004 in [place] , but no PCI) with plan to continue medical therapy in the absence of angina symptoms.  She is currently on Plavix  75 mg daily and Crestor  40 mg daily.   4.  Mixed hyperlipidemia.  LDL 76 in April.  Continue Crestor  40 mg daily.  Disposition:  Follow up 1 year.  Signed, Jayson JUDITHANN Sierras, M.D., F.A.C.C. [facility]

## 2024-05-10 ENCOUNTER — Other Ambulatory Visit: Payer: Self-pay

## 2024-05-10 NOTE — Telephone Encounter (Unsigned)
 Copied from CRM [id number]. Topic: Clinical - Medication Refill >> May 10, 2024  2:18 PM Selinda RAMAN wrote: Medication: albuterol  (VENTOLIN  HFA) 108 (90 Base) MCG/ACT inhaler  Has the patient contacted their pharmacy? Yes   This is the patient's preferred pharmacy:  [facility] - [place], [place] - [address] [place] [address] Phone: [phone] Fax: [phone]  Is this the correct pharmacy for this prescription? Yes If no, delete pharmacy and type the correct one.   Has the prescription been filled recently? No  Is the patient out of the medication? No but she is running low  Has the patient been seen for an appointment in the last year OR does the patient have an upcoming appointment? Yes  Can we respond through MyChart? No she would prefer a text or call  Please assist patient further

## 2024-05-11 MED ORDER — ALBUTEROL SULFATE HFA 108 (90 BASE) MCG/ACT IN AERS: 1.0000 | INHALATION_SPRAY | Freq: Four times a day (QID) | RESPIRATORY_TRACT | 2 refills | Status: AC | PRN

## 2024-05-15 DIAGNOSIS — E66811 Obesity, class 1: Secondary | ICD-10-CM | POA: Diagnosis not present

## 2024-05-15 DIAGNOSIS — Z6830 Body mass index (BMI) 30.0-30.9, adult: Secondary | ICD-10-CM | POA: Diagnosis not present

## 2024-05-15 DIAGNOSIS — Z0130 Encounter for examination of blood pressure without abnormal findings: Secondary | ICD-10-CM | POA: Diagnosis not present

## 2024-05-15 DIAGNOSIS — N182 Chronic kidney disease, stage 2 (mild): Secondary | ICD-10-CM | POA: Diagnosis not present

## 2024-05-15 DIAGNOSIS — E1122 Type 2 diabetes mellitus with diabetic chronic kidney disease: Secondary | ICD-10-CM | POA: Diagnosis not present

## 2024-05-15 DIAGNOSIS — Z139 Encounter for screening, unspecified: Secondary | ICD-10-CM | POA: Diagnosis not present

## 2024-05-15 DIAGNOSIS — I10 Essential (primary) hypertension: Secondary | ICD-10-CM | POA: Diagnosis not present

## 2024-05-15 DIAGNOSIS — R2231 Localized swelling, mass and lump, right upper limb: Secondary | ICD-10-CM | POA: Diagnosis not present

## 2024-05-21 ENCOUNTER — Ambulatory Visit: Admitting: "Endocrinology

## 2024-05-25 DIAGNOSIS — R809 Proteinuria, unspecified: Secondary | ICD-10-CM | POA: Diagnosis not present

## 2024-05-25 DIAGNOSIS — N1832 Chronic kidney disease, stage 3b: Secondary | ICD-10-CM | POA: Diagnosis not present

## 2024-05-25 DIAGNOSIS — I129 Hypertensive chronic kidney disease with stage 1 through stage 4 chronic kidney disease, or unspecified chronic kidney disease: Secondary | ICD-10-CM | POA: Diagnosis not present

## 2024-05-25 DIAGNOSIS — E1129 Type 2 diabetes mellitus with other diabetic kidney complication: Secondary | ICD-10-CM | POA: Diagnosis not present

## 2024-05-30 NOTE — Telephone Encounter (Signed)
 error

## 2024-06-01 DIAGNOSIS — E1122 Type 2 diabetes mellitus with diabetic chronic kidney disease: Secondary | ICD-10-CM | POA: Diagnosis not present

## 2024-06-01 DIAGNOSIS — N1832 Chronic kidney disease, stage 3b: Secondary | ICD-10-CM | POA: Diagnosis not present

## 2024-06-01 DIAGNOSIS — F320 Major depressive disorder, single episode, mild: Secondary | ICD-10-CM | POA: Diagnosis not present

## 2024-06-01 DIAGNOSIS — E785 Hyperlipidemia, unspecified: Secondary | ICD-10-CM | POA: Diagnosis not present

## 2024-06-01 DIAGNOSIS — I209 Angina pectoris, unspecified: Secondary | ICD-10-CM | POA: Diagnosis not present

## 2024-06-01 DIAGNOSIS — Z8249 Family history of ischemic heart disease and other diseases of the circulatory system: Secondary | ICD-10-CM | POA: Diagnosis not present

## 2024-06-01 DIAGNOSIS — E1151 Type 2 diabetes mellitus with diabetic peripheral angiopathy without gangrene: Secondary | ICD-10-CM | POA: Diagnosis not present

## 2024-06-01 DIAGNOSIS — R32 Unspecified urinary incontinence: Secondary | ICD-10-CM | POA: Diagnosis not present

## 2024-06-01 DIAGNOSIS — I129 Hypertensive chronic kidney disease with stage 1 through stage 4 chronic kidney disease, or unspecified chronic kidney disease: Secondary | ICD-10-CM | POA: Diagnosis not present

## 2024-06-10 ENCOUNTER — Other Ambulatory Visit: Payer: Self-pay | Admitting: Internal Medicine

## 2024-07-04 ENCOUNTER — Encounter: Payer: Self-pay | Admitting: "Endocrinology

## 2024-07-04 ENCOUNTER — Ambulatory Visit: Admitting: "Endocrinology

## 2024-08-13 NOTE — Progress Notes (Signed)
 Pharmacy Quality Measure Review  This patient is appearing on a report for being at risk of failing the adherence measure for hypertension (ACEi/ARB) medications this calendar year.   Medication: Quinapril  20 mg Last fill date: 07/13/24 for 90 day supply  Insurance report was not up to date. No action needed at this time.   Jenkins Graces, PharmD PGY1 Pharmacy Resident

## 2024-09-24 ENCOUNTER — Encounter (HOSPITAL_COMMUNITY): Payer: Self-pay

## 2024-09-24 ENCOUNTER — Emergency Department (HOSPITAL_COMMUNITY)
Admission: EM | Admit: 2024-09-24 | Discharge: 2024-09-24 | Disposition: A | Discharge status: Home / Self Care | Attending: Emergency Medicine | Admitting: Emergency Medicine

## 2024-09-24 ENCOUNTER — Ambulatory Visit: Payer: Self-pay

## 2024-09-24 ENCOUNTER — Other Ambulatory Visit: Payer: Self-pay

## 2024-09-24 ENCOUNTER — Emergency Department (HOSPITAL_COMMUNITY)

## 2024-09-24 ENCOUNTER — Ambulatory Visit
Admission: EM | Admit: 2024-09-24 | Discharge: 2024-09-24 | Disposition: A | Discharge status: Home / Self Care | Source: Home / Self Care

## 2024-09-24 DIAGNOSIS — R42 Dizziness and giddiness: Secondary | ICD-10-CM | POA: Diagnosis not present

## 2024-09-24 DIAGNOSIS — Z7982 Long term (current) use of aspirin: Secondary | ICD-10-CM | POA: Insufficient documentation

## 2024-09-24 DIAGNOSIS — R519 Headache, unspecified: Secondary | ICD-10-CM | POA: Insufficient documentation

## 2024-09-24 DIAGNOSIS — M79604 Pain in right leg: Secondary | ICD-10-CM | POA: Diagnosis not present

## 2024-09-24 DIAGNOSIS — W000XXA Fall on same level due to ice and snow, initial encounter: Secondary | ICD-10-CM | POA: Insufficient documentation

## 2024-09-24 DIAGNOSIS — M545 Low back pain, unspecified: Secondary | ICD-10-CM | POA: Insufficient documentation

## 2024-09-24 DIAGNOSIS — M542 Cervicalgia: Secondary | ICD-10-CM

## 2024-09-24 DIAGNOSIS — M25512 Pain in left shoulder: Secondary | ICD-10-CM | POA: Insufficient documentation

## 2024-09-24 DIAGNOSIS — M79601 Pain in right arm: Secondary | ICD-10-CM | POA: Diagnosis not present

## 2024-09-24 DIAGNOSIS — S0990XA Unspecified injury of head, initial encounter: Secondary | ICD-10-CM

## 2024-09-24 DIAGNOSIS — M25511 Pain in right shoulder: Secondary | ICD-10-CM | POA: Insufficient documentation

## 2024-09-24 DIAGNOSIS — W19XXXA Unspecified fall, initial encounter: Secondary | ICD-10-CM | POA: Diagnosis not present

## 2024-09-24 MED ORDER — ACETAMINOPHEN 500 MG PO TABS
1000.0000 mg | ORAL_TABLET | Freq: Once | ORAL | Status: AC
  Administered 2024-09-24: 1000 mg via ORAL

## 2024-09-24 NOTE — ED Triage Notes (Addendum)
 Pt reports fall last Wednesday has found no relief in the OTC medication. Pain is present very where. Mostly on the right side in her hip, elbow, leg and ankle, right hand, and the lower back. As well as the left side neck and left side of her chest.   Pt states she opened the door to her car to take out her toilet paper , slipped, hitting the ice, states her head hit the ice, also reports headaches and dizziness, got up then fell back down trying to get up.

## 2024-09-24 NOTE — ED Triage Notes (Signed)
 Pt arrived via POV c/o falling and slipping on the ice on her right side. Pt c/o right sided pain both right arm and right leg, as well as lower back and neck. Pt did hit her and head and pt takes aspirin .

## 2024-09-24 NOTE — ED Notes (Signed)
 Patient is being discharged from the Urgent Care and sent to the Emergency Department via pv . Per provider Vernell CROME., patient is in need of higher level of care due to fall. Patient is aware and verbalizes understanding of plan of care.  Vitals:   09/24/24 1456  BP: (!) 180/76  Pulse: 83  Resp: 16  Temp: 99 F (37.2 C)  SpO2: 93%

## 2024-09-24 NOTE — Discharge Instructions (Signed)
 As discussed, it is normal to feel soreness for up to 2 weeks following a fall.  200 mg of ibuprofen, 3 times daily for the next 3 days, taking with food will beneficial for pain control. Return here for concerning changes in your condition.

## 2024-09-27 ENCOUNTER — Ambulatory Visit: Payer: Self-pay

## 2024-10-05 ENCOUNTER — Ambulatory Visit: Payer: Self-pay

## 2024-11-14 ENCOUNTER — Ambulatory Visit
